# Patient Record
Sex: Male | Born: 1965 | Race: White | Hispanic: No | Smoking: Current every day smoker
Health system: Southern US, Community
[De-identification: ages and names within clinical notes are randomized; demographics above are authoritative.]

## PROBLEM LIST (undated history)

## (undated) DIAGNOSIS — M1990 Unspecified osteoarthritis, unspecified site: Secondary | ICD-10-CM

## (undated) DIAGNOSIS — G4730 Sleep apnea, unspecified: Secondary | ICD-10-CM

## (undated) DIAGNOSIS — K219 Gastro-esophageal reflux disease without esophagitis: Secondary | ICD-10-CM

## (undated) DIAGNOSIS — Z5189 Encounter for other specified aftercare: Secondary | ICD-10-CM

## (undated) HISTORY — PX: BACK SURGERY: SHX140

## (undated) HISTORY — PX: POSTERIOR FUSION OCCIPUT-C2: SUR624

---

## 1987-11-11 HISTORY — PX: CARDIAC SURGERY: SHX584

## 1987-11-11 HISTORY — PX: STOMACH SURGERY: SHX791

## 2001-07-08 ENCOUNTER — Ambulatory Visit (HOSPITAL_BASED_OUTPATIENT_CLINIC_OR_DEPARTMENT_OTHER): Admission: RE | Admit: 2001-07-08 | Discharge: 2001-07-08 | Payer: Self-pay | Admitting: Pulmonary Disease

## 2003-10-25 ENCOUNTER — Ambulatory Visit (HOSPITAL_COMMUNITY): Admission: RE | Admit: 2003-10-25 | Discharge: 2003-10-26 | Payer: Self-pay | Admitting: Neurosurgery

## 2003-11-27 ENCOUNTER — Ambulatory Visit (HOSPITAL_BASED_OUTPATIENT_CLINIC_OR_DEPARTMENT_OTHER): Admission: RE | Admit: 2003-11-27 | Discharge: 2003-11-27 | Payer: Self-pay | Admitting: Pulmonary Disease

## 2004-01-11 ENCOUNTER — Encounter: Admission: RE | Admit: 2004-01-11 | Discharge: 2004-01-11 | Payer: Self-pay | Admitting: Neurosurgery

## 2004-07-09 ENCOUNTER — Encounter: Admission: RE | Admit: 2004-07-09 | Discharge: 2004-07-09 | Payer: Self-pay | Admitting: Neurosurgery

## 2004-08-29 ENCOUNTER — Inpatient Hospital Stay (HOSPITAL_COMMUNITY): Admission: RE | Admit: 2004-08-29 | Discharge: 2004-08-30 | Payer: Self-pay | Admitting: Neurosurgery

## 2004-09-04 ENCOUNTER — Encounter: Admission: RE | Admit: 2004-09-04 | Discharge: 2004-09-04 | Payer: Self-pay | Admitting: Neurosurgery

## 2004-10-08 ENCOUNTER — Encounter: Admission: RE | Admit: 2004-10-08 | Discharge: 2004-10-08 | Payer: Self-pay | Admitting: Neurosurgery

## 2004-10-09 ENCOUNTER — Encounter: Admission: RE | Admit: 2004-10-09 | Discharge: 2004-10-09 | Payer: Self-pay | Admitting: Neurosurgery

## 2005-01-24 IMAGING — CR DG LUMBAR SPINE 2-3V
3 series · 3 of 3 positions shown · non-contrast
Comparison: none

CLINICAL DATA: Lumbar fusion.  Some right leg pain. 
 LUMBAR SPINE ? THREE VIEWS:
 Three views of the lumbar spine were obtained and compared to an intraoperative film from [HOSPITAL] dated 08/29/04.
 Transpedicular and interbody fusion has been performed at L4-5 and L5-S1.  The interbody fusion plugs appear to be in good position and normal in height and the hardware is intact.  Normal alignment is maintained.  
 There is a rounded soft tissue opacity in the right abdomen.  This could represent a renal cyst, but a soft tissue mass in the right abdomen cannot excluded.  There is also question of a left lower pole renal calculus.

[view not recorded (1 of 3)]
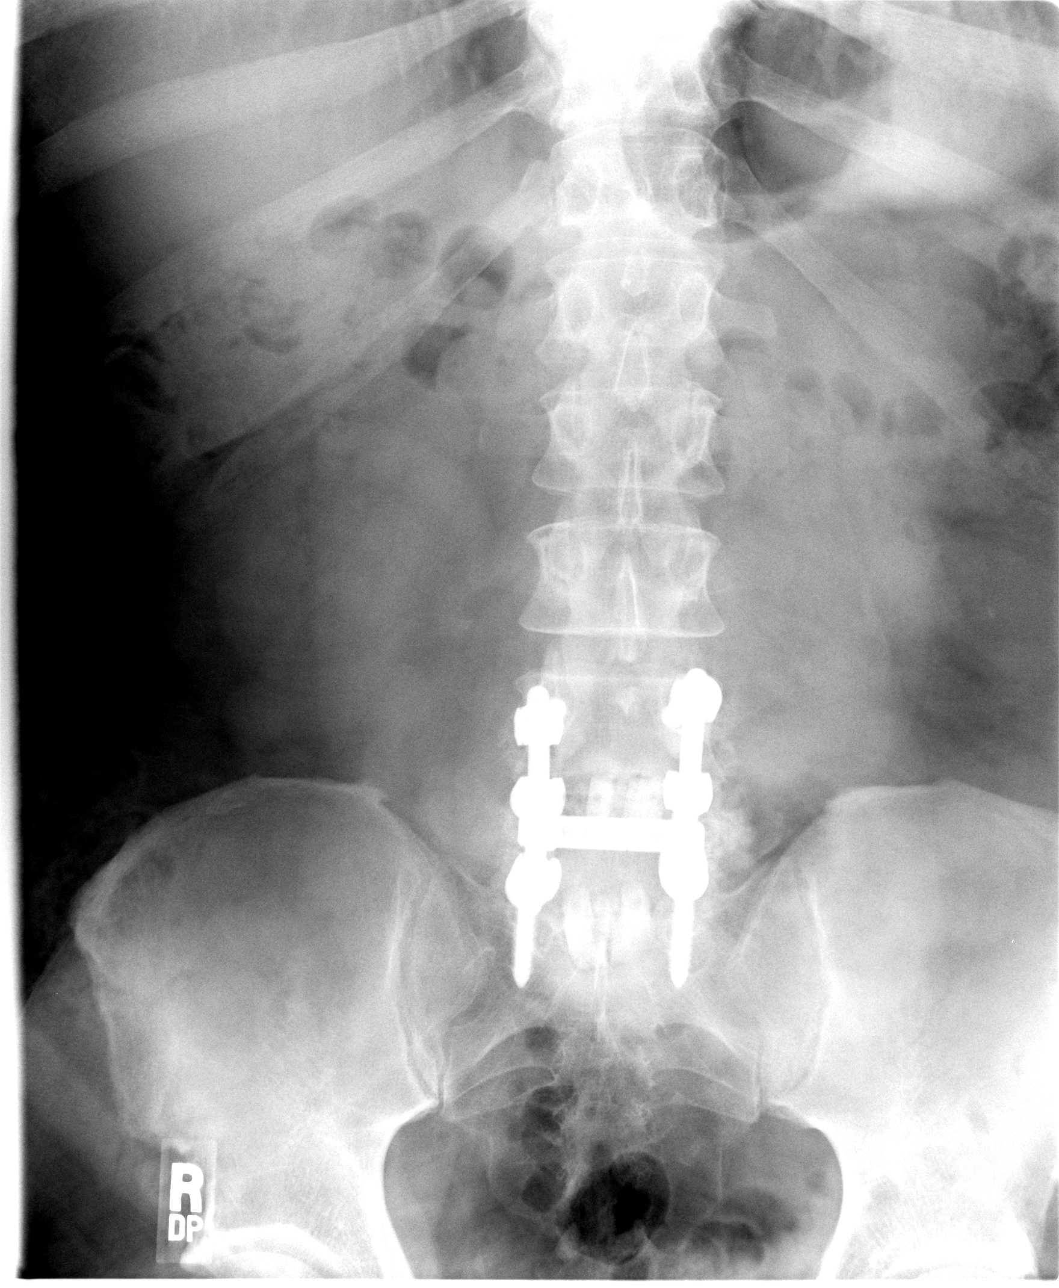

[view not recorded (2 of 3)]
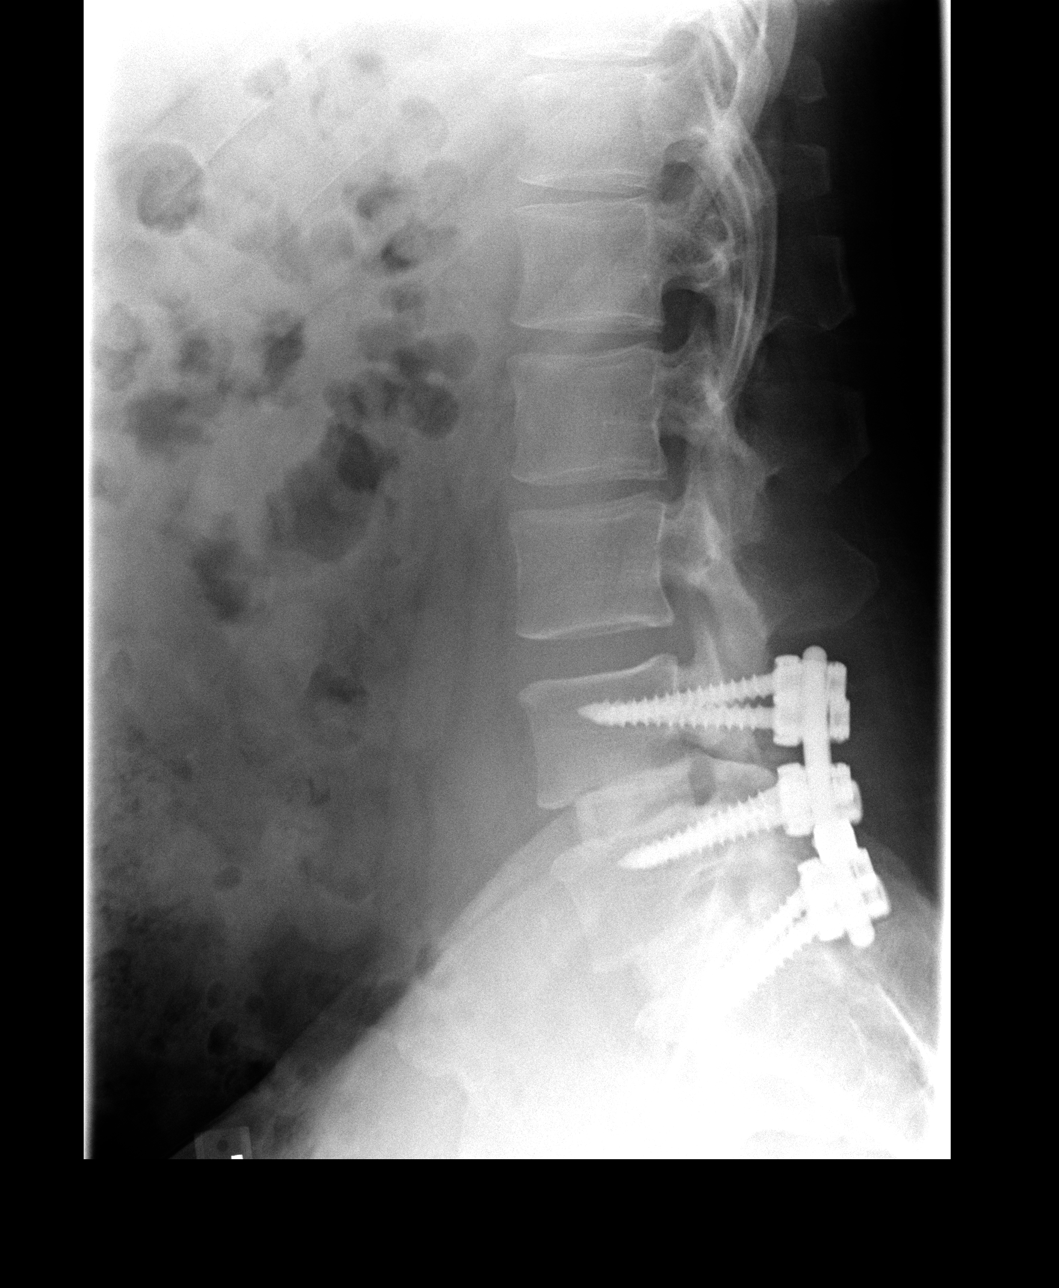

[view not recorded (3 of 3)]
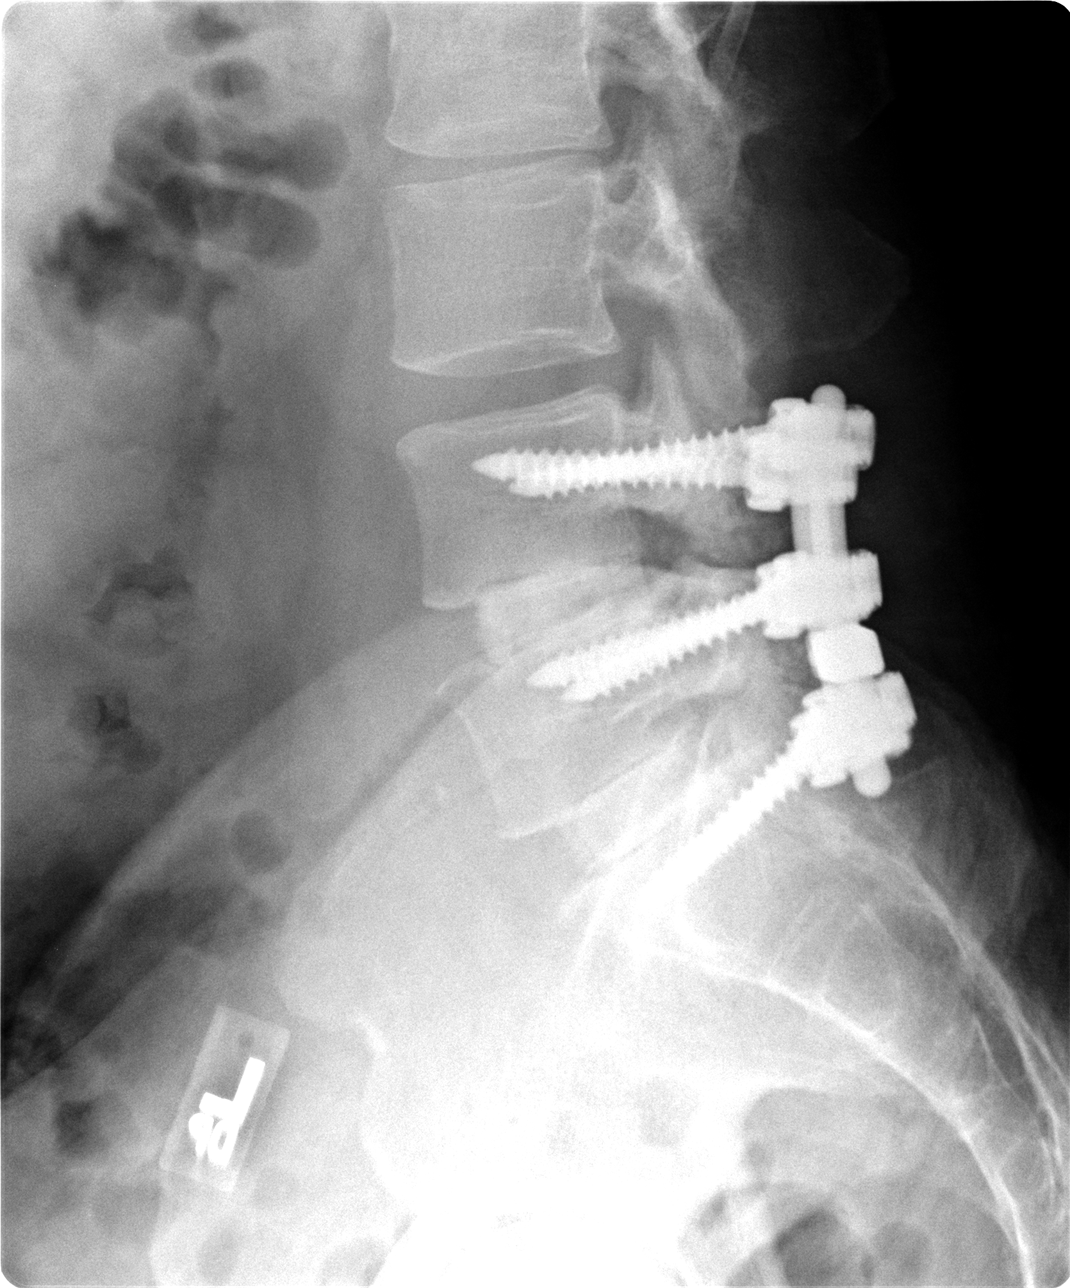

[3 of 3 positions shown; findings below may reference images not displayed]

IMPRESSION: 1.  Transpedicular and interbody fusion at L4-5 and L5-S1.  Normal alignment.  
 2.  Question of rounded soft tissue mass in the right abdomen.  Small left lower pole renal calculus.

## 2005-02-05 ENCOUNTER — Encounter: Admission: RE | Admit: 2005-02-05 | Discharge: 2005-02-05 | Payer: Self-pay | Admitting: Unknown Physician Specialty

## 2005-03-06 ENCOUNTER — Encounter: Admission: RE | Admit: 2005-03-06 | Discharge: 2005-03-06 | Payer: Self-pay | Admitting: Neurosurgery

## 2005-10-20 ENCOUNTER — Ambulatory Visit: Payer: Self-pay | Admitting: Family Medicine

## 2005-11-13 ENCOUNTER — Ambulatory Visit: Payer: Self-pay | Admitting: Family Medicine

## 2005-12-17 ENCOUNTER — Ambulatory Visit: Payer: Self-pay | Admitting: Family Medicine

## 2006-01-15 ENCOUNTER — Ambulatory Visit: Payer: Self-pay | Admitting: Family Medicine

## 2006-07-10 ENCOUNTER — Encounter
Admission: RE | Admit: 2006-07-10 | Discharge: 2006-10-08 | Payer: Self-pay | Admitting: Physical Medicine & Rehabilitation

## 2006-07-10 ENCOUNTER — Ambulatory Visit: Payer: Self-pay | Admitting: Physical Medicine & Rehabilitation

## 2008-04-11 ENCOUNTER — Encounter
Admission: RE | Admit: 2008-04-11 | Discharge: 2008-04-11 | Payer: Self-pay | Admitting: Physical Medicine and Rehabilitation

## 2008-04-25 ENCOUNTER — Encounter: Admission: RE | Admit: 2008-04-25 | Discharge: 2008-04-25 | Payer: Self-pay | Admitting: Neurosurgery

## 2010-06-19 ENCOUNTER — Encounter
Admission: RE | Admit: 2010-06-19 | Discharge: 2010-06-19 | Payer: Self-pay | Admitting: Physical Medicine and Rehabilitation

## 2010-11-30 ENCOUNTER — Encounter: Payer: Self-pay | Admitting: Neurosurgery

## 2010-12-05 ENCOUNTER — Ambulatory Visit (HOSPITAL_COMMUNITY)
Admission: RE | Admit: 2010-12-05 | Discharge: 2010-12-05 | Payer: Commercial Managed Care - PPO | Source: Home / Self Care | Attending: Neurosurgery | Admitting: Neurosurgery

## 2010-12-05 LAB — CBC
HCT: 44.3 % (ref 39.0–52.0)
Hemoglobin: 15.1 g/dL (ref 13.0–17.0)
MCH: 30.8 pg (ref 26.0–34.0)
MCHC: 34.1 g/dL (ref 30.0–36.0)
MCV: 90.4 fL (ref 78.0–100.0)
Platelets: 261 10*3/uL (ref 150–400)
RBC: 4.9 MIL/uL (ref 4.22–5.81)
RDW: 13.1 % (ref 11.5–15.5)
WBC: 7.5 10*3/uL (ref 4.0–10.5)

## 2010-12-05 LAB — SURGICAL PCR SCREEN
MRSA, PCR: NEGATIVE
Staphylococcus aureus: POSITIVE — AB

## 2010-12-05 LAB — APTT: aPTT: 28 seconds (ref 24–37)

## 2010-12-05 LAB — BASIC METABOLIC PANEL
BUN: 16 mg/dL (ref 6–23)
CO2: 26 mEq/L (ref 19–32)
Calcium: 9.6 mg/dL (ref 8.4–10.5)
Chloride: 106 mEq/L (ref 96–112)
Creatinine, Ser: 0.81 mg/dL (ref 0.4–1.5)
GFR calc Af Amer: >60 mL/min (ref >60)
Potassium: 5 mEq/L (ref 3.5–5.1)

## 2010-12-05 LAB — TYPE AND SCREEN
ABO/RH(D): A POS
Antibody Screen: NEGATIVE

## 2010-12-05 LAB — ABO/RH: ABO/RH(D): A POS

## 2010-12-05 LAB — PROTIME-INR: Prothrombin Time: 13.2 seconds (ref 11.6–15.2)

## 2011-02-24 ENCOUNTER — Inpatient Hospital Stay (HOSPITAL_COMMUNITY)
Admission: RE | Admit: 2011-02-24 | Payer: Commercial Managed Care - PPO | Source: Ambulatory Visit | Admitting: Neurosurgery

## 2011-03-28 NOTE — Op Note (Signed)
NAME:  Ryan [name], Ryan [name]                        ACCOUNT NO.:  [id number]   MEDICAL RECORD NO.:  [id number]                   PATIENT TYPE:  OUT   LOCATION:  SLEE                                 FACILITY:  [facility]   PHYSICIAN:  Donalee Citrin, M.D.                     DATE OF BIRTH:  1966-02-07   DATE OF PROCEDURE:  10/25/2003  DATE OF DISCHARGE:                                 OPERATIVE REPORT   PREOPERATIVE DIAGNOSIS:  Cervical myelopathy from severe spinal stenosis at  C5-6 secondary to a large ruptured disk with metamyelia within the cord.   POSTOPERATIVE DIAGNOSIS:  Cervical myelopathy from severe spinal stenosis at  C5-6 secondary to a large ruptured disk with  metamyelia within the cord.   OPERATION PERFORMED:  Anterior cervical diskectomy and fusion at C5-6 using  an 8 mm patellar wedge and a 25 mm Atlantis plate, four 15 mm variable angle  screws.   SURGEON:  Donalee Citrin, M.D.   ASSISTANT:  Tia Alert, MD   ANESTHESIA:  General endotracheal.   INDICATIONS FOR PROCEDURE:  The patient is a very pleasant 45 year old  gentleman who sustained a work related injury with severe neck pain and is  here because of weakness of his hands with his legs giving way on him.  Preoperative assessment revealed a cervical myelopathy with weakness of his  triceps, weakness of his hands and difficulty with his gait.  MRI showed a  very large ruptured disk compressing the spinal cord that caused metamyelia  within the cord and severe spinal stenosis. The patient was recommended  anterior cervical diskectomy and fusion.  I extensively went over the risks  and benefits of surgery with him.  He understands and agreed to proceed  forward.   DESCRIPTION OF PROCEDURE:  The patient was brought to the operating room was  induced under general anesthesia, placed supine with neck in slight  extension with five pounds of halter traction.  The right side of the neck  was prepped and draped in the usual  sterile fashion after preoperative x-ray  localized the C5 vertebral body.  A curvilinear incision was made just off  the midline extended toward the anterior border of the sternocleidomastoid  muscle and the superficial layer of the platysmas was dissected out and  divided longitudinally.  The avascular plane between the sternocleidomastoid  and the strap muscles was developed down to prevertebral fascia.  The  prevertebral fascia was incised with Kitners.  Then intraoperative x-ray  confirmed localization of the C4-5 disk space.  Then annulotomy was made one  disk space below this with a 15 blade scalpel.  Pituitary rongeurs were used  to adequately clean out the disk space from the anterior margin of the  annulus as well as the VA upgoing curet was used to scrape the end plates  and remove the anterior margin of the annulus.  Using  the high speed drill  the remainder of the end plates were evened off and squared down with the  posterior annulus.  Then using pituitary rongeurs, a large fragment was  removed that pushed through a rent in the posterior annulus and posterior  longitudinal ligament compressing the central part of the spinal cord.  Then  using a blunt nerve hook, several large fragments of disk were removed from  this rent in the ligament and the thecal sac was visualized.  Then using a 1  and 2 mm Kerrison punches the remainder of the posterior longitudinal  ligament was removed in piecemeal fashion and several more fragments of disk  were removed compressing the spinal cord from behind the C5 vertebral body.  The posterior longitudinal ligament was noted to be severely hypertrophied  behind the bodies of C5 and C6.  This was all underbitten with a 1 and 2 mm  Kerrison punch decompressing the thecal sac and spinal cord.  At the end of  the diskectomy, the thecal sac was noted to be completely decompressed with  good spinal cord pulsations visualized through it.  Both proximal  aspects of  both C6 neural foramina were visualized.  There was no further disk fragment  appreciated behind the C5 vertebral body or behind C6 and the spinal cord  was decompressed.  The wound was copiously irrigated.  The end plates were  scraped and prepared to receive the bone graft and a 8 mm patellar wedge was  sized, selected, fashioned and placed 1 mm deep to the anterior vertebral  body line.  Then a 25 mm Atlantis plate was sized, selected and inserted  with four 15 mm variable angle screws which were drilled, tapped and placed.  All screws had excellent purchase.  Screws were tightened down.  The wound  was copiously irrigated and meticulous hemostasis was maintained.  The  platysma was reapproximated with 3-0 interrupted Vicryl and the skin was  closed with running 4-0 subcuticular.  Benzoin and Steri-Strips were  applied.  The patient was then transferred to the recovery room in stable  condition.  At the end of the case, sponge, needle and instrument counts  were correct.                                               Donalee Citrin, M.D.    GC/MEDQ  D:  10/25/2003  T:  10/26/2003  Job:  [id number]

## 2011-03-28 NOTE — Op Note (Signed)
Ryan [name], Ryan [name]              ACCOUNT NO.:  [id number]   MEDICAL RECORD NO.:  [id number]          PATIENT TYPE:  INP   LOCATION:  2899                         FACILITY:  [facility]   PHYSICIAN:  Donalee Citrin, M.D.        DATE OF BIRTH:  1966/08/07   DATE OF PROCEDURE:  DATE OF DISCHARGE:                                 OPERATIVE REPORT   DATE OF OPERATION:  August 29, 2004.   PREOPERATIVE DIAGNOSES:  1.  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  2.  Severe degenerative disease with annular tearing, L4-5 and L5-S1, with      mechanical back pain and neurogenic claudication.   POSTOPERATIVE DIAGNOSES:  1.  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  2.  Severe degenerative disease with annular tearing, L4-5 and L5-S1, with      mechanical back pain and neurogenic claudication.   PROCEDURE:  1.  Radical decompressive laminectomies, L4-5 and L5-S1.  2.  Posterior lumbar interbody fusion, L4-5 and L5-S1, using 12 x 26 mm      tangent allograft wedges at L4-5, and 10 x 26 mm wedges at L5-S1.  3.  Pedicle screw fixation, L4 to S1, using the M-10 CD Horizon pedicle      screw system.  4.  Posterolateral arthrodesis, L4 to S1, using locally harvested autograft.  5.  Placement of medium Hemovac drains.   SURGEON:  Donalee Citrin, MD.   ASSISTANT:  Tia Alert, MD.   ANESTHESIA:  General endotracheal.   HISTORY OF PRESENT ILLNESS:  The patient is a very pleasant, 45 year old  gentleman, who has had longstanding back and bilateral leg pain following a  work-related injury to which initially he was treated with a large ruptured  disk causing cervical myelopathy in his neck; however, he had persistent  back and leg pain.  This was worked up with MRI and diskography, which  showed severe annular tears and disk injury on the top on the face of  congenital short pedicles.  The patient subsequently failed all  forms of  conservative treatment and was recommended decompressive stabilization  procedure when diskography confirmed 2 concordant disk injections and 1  normal control in the setting of bilateral lumbar radiculopathy consistent  with both L4-5 and S1 radiculopathies.  The patient was explained the risks  and benefits of the surgery.  These were extensively discussed with him.  He  understood and agreed to proceed forward.   The patient was brought to the OR and was induced under general anesthesia.  He was positioned prone on the Wilson frame.  The back was scrubbed in the  usual sterile fashion.  Preop x-ray localized the L5 vertebral body.  A  midline incision was made after infiltration with 10 mL of lidocaine with  epi.  Bovie electrocautery was used to take down the subcutaneous tissue,  and subperiosteal dissection was carried out in the lamina of L4-5 and S1  bilaterally.  T piece were also dissected  free at L4-5 and S1, and then a  self-retaining retractor was placed.  Intraoperative x-rays confirmed  localization at L5 pedicle.  Then using a Leksell rongeur, the spinous  process of L4 and L5 was removed, and radical decompressive laminectomies  were begun at L4-5 and L5-S1.  There was marked, severe, spinal stenosis  with hourglass displacement at the thecal sac, both in the facet complexes  at L4-5 as well as L5-S1.  These were all dissected free with a #4 Penfield  and under bent with a 3 and 4 mm Kerrison punch.  The entire medial facets  were removed from the L4-5 and L5-S1 facet complexes on each side.  Both L4,  L5, and S1 neuroforamen were identified and radically decompressed.  The L4  and L5 nerve roots were markedly stenotic.  These were all under bent with a  2 and 3 mm Kerrison punch.  Then, attention was taken to the disk space  where the epidural vein was coagulated.  The right-sided L5 nerve root was  reflected medially.  Annulotomy was made in the disk at L4-5 on  the right,  and this disk was adequately cleaned out.  The disk was noted to be markedly  liquified with vacuum phenomena and severe injury.  This was all cleaned  out.  A downgoing Epstein curette, size 12 cutter and chisel, and a 12 x 26  mm tangent allograft was inserted on the right side.  This was all confirmed  with fluoroscopy at each step along the way.  Then on the left side, the L5  nerve root was reflected medially, the disk space was cleaned out, and using  a size 12 cutter and chisel, the end plates were prepared, a downgoing  Epstein curette used to scrape off the central end plates, locally harvested  allograft was packed against the right-sided allograft, and the left-sided  allograft was inserted.  Then, attention was taken to L5-S1.  First on the  right, the S1 nerve root was reflected medially, the disk space was entered,  cleaned out, and incised, a #10 distractor was inserted, this had good  apposition in the end plates.  Attention was taken to the left side where  the left S1 nerve root was reflected medially.  The disk space was cleaned  out with a size #10 cutter and chisel.  Again, fluoroscopy confirming depth  and trajectory each step along the way.  The disk space was adequately  cleaned out.  The disk was noted to be markedly liquified and also  degenerated with severe injury and vacuum phenomena.  Then the left-sided  allograft was inserted, then the right side distractor was removed.  Disc  spaces and end plates were prepared in a similar fashion.  Locally harvested  allograft was packed against the left-sided allograft, and the right-sided  allograft was inserted.  Again, fluoroscopy confirmed good position of each  allograft at each step along the way.  Then attention was taken to pedicle  screw placement using high speed drill and from intracanalicular as well as fluoroscopic landmarks, pilot holes were drilled and cannulated with the  olive probe, tapped  with a 5.5 tap, and 6.5 x 45 pedicle screw inserted at  L4 on the left.  At each step along the way, the pedicle was explored from  within as well as the canal explored.  The medial aspect was confirmed with  no medial lateral breach.  After the 6.5 x 45 screw was  inserted at L4, a  6.5 x 40 at L5 and a 6.5 x 35 at S1 in similar fashions, all on the left  side.  Then attention was taken to the right side, again in a similar  fashion with pilot holes tapped with a 5.5 tap, probed, and a 6.5 x 45 screw  at 4, a 6.5 x 40 at 5, and a 6.5 x 35 at S1.  After all 6 pedicle screws had  been placed, the wound was copiously irrigated, good hemostasis was  maintained.  Aggressive decortication was carried out in the lateral T piece  and gutters on both sides.  The remainder of the locally harvested allograft  was packed in the lateral gutters.  Then 60-mm rods were sized, selected,  and inserted and pre-lordosed.  __________ were tightened down at S1.  The  L5 pedicle screw was compressed against S1 and tightened down, and the L4  pedicle screw compressed against L5.  After all pedicle screws and rods had  been placed, a 421 cross-link was inserted.  The epidural vein was  recoagulated, the neuroforamen were re-explored confirming no graft material  had migrated and the nerves were widely decompressed.  Gelfoam was laid on  top of the dura, and postop fluoroscopy confirmed good position of the rods,  screws, and bone grafts.  Then a medium Hemovac drain was placed.  The  muscle and fascia were reapproximated with 0 interrupted Vicryl, the  subcutaneous tissues with reapproximated with 2-0 interrupted Vicryl, and  the skin was closed with a running 4-0 subcuticular.  Benzoin Steri-Strips  applied.  Patient went to the recovery room in stable condition.  At the end  of the case, needle count and sponge count were correct.     GC/MEDQ  D:  08/29/2004  T:  08/29/2004  Job:  [id number]
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
## 2012-03-04 ENCOUNTER — Other Ambulatory Visit: Payer: Self-pay | Admitting: Neurosurgery

## 2012-03-23 ENCOUNTER — Encounter (HOSPITAL_COMMUNITY): Payer: Self-pay | Admitting: Pharmacy Technician

## 2012-04-02 ENCOUNTER — Encounter (HOSPITAL_COMMUNITY)
Admission: RE | Admit: 2012-04-02 | Discharge: 2012-04-02 | Disposition: A | Discharge status: Home / Self Care | Payer: Medicare Other | Source: Ambulatory Visit | Attending: Neurosurgery | Admitting: Neurosurgery

## 2012-04-02 ENCOUNTER — Other Ambulatory Visit (HOSPITAL_COMMUNITY): Payer: Commercial Managed Care - PPO

## 2012-04-02 ENCOUNTER — Inpatient Hospital Stay (HOSPITAL_COMMUNITY)
Admission: RE | Admit: 2012-04-02 | Discharge: 2012-04-02 | Payer: Commercial Managed Care - PPO | Source: Ambulatory Visit

## 2012-04-02 ENCOUNTER — Encounter (HOSPITAL_COMMUNITY): Payer: Self-pay

## 2012-04-02 HISTORY — DX: Sleep apnea, unspecified: G47.30

## 2012-04-02 HISTORY — DX: Gastro-esophageal reflux disease without esophagitis: K21.9

## 2012-04-02 HISTORY — DX: Unspecified osteoarthritis, unspecified site: M19.90

## 2012-04-02 HISTORY — DX: Encounter for other specified aftercare: Z51.89

## 2012-04-02 LAB — TYPE AND SCREEN
ABO/RH(D): A POS
Antibody Screen: NEGATIVE

## 2012-04-02 LAB — BASIC METABOLIC PANEL WITH GFR
BUN: 13 mg/dL (ref 6–23)
CO2: 27 meq/L (ref 19–32)
Calcium: 10 mg/dL (ref 8.4–10.5)
Chloride: 97 meq/L (ref 96–112)
Creatinine, Ser: 0.76 mg/dL (ref 0.50–1.35)
GFR calc Af Amer: >90 mL/min
GFR calc non Af Amer: >90 mL/min
Glucose, Bld: 377 mg/dL — ABNORMAL HIGH (ref 70–99)
Potassium: 4.2 meq/L (ref 3.5–5.1)
Sodium: 134 meq/L — ABNORMAL LOW (ref 135–145)

## 2012-04-02 LAB — CBC
HCT: 45 % (ref 39.0–52.0)
Hemoglobin: 16.1 g/dL (ref 13.0–17.0)
MCH: 31.6 pg (ref 26.0–34.0)
MCHC: 35.8 g/dL (ref 30.0–36.0)

## 2012-04-02 NOTE — Progress Notes (Deleted)
vanessa called to have dr pool release orders

## 2012-04-02 NOTE — Pre-Procedure Instructions (Signed)
20 BRAVE DACK  04/02/2012   Your procedure is scheduled on:  04/07/12  Report to [facility] at 630 AM.  Call this number if you have problems the morning of surgery: [phone]   Remember:   Do not eat food:After Midnight.  May have clear liquids: up to 4 Hours before arrival 230 am.  Clear liquids include soda, tea, black coffee, apple or grape juice, broth.  Take these medicines the morning of surgery with A SIP OF WATER: pain med, lyrica, inhaler   Do not wear jewelry, make-up or nail polish.  Do not wear lotions, powders, or perfumes. You may wear deodorant.  Do not shave 48 hours prior to surgery. Men may shave face and neck.  Do not bring valuables to the hospital.  Contacts, dentures or bridgework may not be worn into surgery.  Leave suitcase in the car. After surgery it may be brought to your room.  For patients admitted to the hospital, checkout time is 11:00 AM the day of discharge.   Patients discharged the day of surgery will not be allowed to drive home.  Name and phone number of your driver: Laverna Peace [phone]  Special Instructions: CHG Shower Use Special Wash: 1/2 bottle night before surgery and 1/2 bottle morning of surgery.   Please read over the following fact sheets that you were given: Pain Booklet, Coughing and Deep Breathing, Blood Transfusion Information, MRSA Information and Surgical Site Infection Prevention

## 2012-04-06 MED ORDER — CEFAZOLIN SODIUM-DEXTROSE 2-3 GM-% IV SOLR
2.0000 g | INTRAVENOUS | Status: DC
  Administered 2012-04-07: 2 g via INTRAVENOUS
  Filled 2012-04-06: qty 50

## 2012-04-06 MED ORDER — CEFAZOLIN SODIUM 1-5 GM-% IV SOLN: 1.0000 g | INTRAVENOUS | Status: DC

## 2012-04-07 ENCOUNTER — Inpatient Hospital Stay (HOSPITAL_COMMUNITY): Payer: Medicare Other

## 2012-04-07 ENCOUNTER — Ambulatory Visit (HOSPITAL_COMMUNITY): Payer: Medicare Other | Admitting: Certified Registered"

## 2012-04-07 ENCOUNTER — Inpatient Hospital Stay (HOSPITAL_COMMUNITY)
Admission: RE | Admit: 2012-04-07 | Discharge: 2012-04-08 | DRG: 460 | Disposition: A | Discharge status: Home / Self Care | Payer: Medicare Other | Source: Ambulatory Visit | Attending: Neurosurgery | Admitting: Neurosurgery

## 2012-04-07 ENCOUNTER — Encounter (HOSPITAL_COMMUNITY): Payer: Self-pay | Admitting: Surgery

## 2012-04-07 ENCOUNTER — Encounter (HOSPITAL_COMMUNITY): Payer: Self-pay | Admitting: Certified Registered"

## 2012-04-07 ENCOUNTER — Encounter (HOSPITAL_COMMUNITY): Payer: Self-pay | Admitting: General Practice

## 2012-04-07 ENCOUNTER — Encounter (HOSPITAL_COMMUNITY)
Admission: RE | Disposition: A | Discharge status: Home / Self Care | Payer: Self-pay | Source: Ambulatory Visit | Attending: Neurosurgery

## 2012-04-07 DIAGNOSIS — J45909 Unspecified asthma, uncomplicated: Secondary | ICD-10-CM | POA: Diagnosis present

## 2012-04-07 DIAGNOSIS — E119 Type 2 diabetes mellitus without complications: Secondary | ICD-10-CM | POA: Diagnosis present

## 2012-04-07 DIAGNOSIS — Z472 Encounter for removal of internal fixation device: Secondary | ICD-10-CM

## 2012-04-07 DIAGNOSIS — Z01812 Encounter for preprocedural laboratory examination: Secondary | ICD-10-CM

## 2012-04-07 DIAGNOSIS — F17200 Nicotine dependence, unspecified, uncomplicated: Secondary | ICD-10-CM | POA: Diagnosis present

## 2012-04-07 DIAGNOSIS — M48061 Spinal stenosis, lumbar region without neurogenic claudication: Principal | ICD-10-CM | POA: Diagnosis present

## 2012-04-07 DIAGNOSIS — K219 Gastro-esophageal reflux disease without esophagitis: Secondary | ICD-10-CM | POA: Diagnosis present

## 2012-04-07 HISTORY — PX: LUMBAR FUSION: SHX111

## 2012-04-07 LAB — GLUCOSE, CAPILLARY
Glucose-Capillary: 321 mg/dL — ABNORMAL HIGH (ref 70–99)
Glucose-Capillary: 335 mg/dL — ABNORMAL HIGH (ref 70–99)

## 2012-04-07 SURGERY — POSTERIOR LUMBAR FUSION 1 LEVEL
Anesthesia: General | Site: Back

## 2012-04-07 MED ORDER — ONDANSETRON HCL 4 MG/2ML IJ SOLN
4.0000 mg | INTRAMUSCULAR | Status: DC | PRN
  Administered 2012-04-07: 4 mg via INTRAVENOUS
  Filled 2012-04-07: qty 2

## 2012-04-07 MED ORDER — ONDANSETRON HCL 4 MG/2ML IJ SOLN
4.0000 mg | Freq: Once | INTRAMUSCULAR | Status: AC | PRN
  Administered 2012-04-07: 4 mg via INTRAVENOUS

## 2012-04-07 MED ORDER — PROPOFOL 10 MG/ML IV EMUL
INTRAVENOUS | Status: DC | PRN
  Administered 2012-04-07: 100 mg via INTRAVENOUS

## 2012-04-07 MED ORDER — SIMVASTATIN 20 MG PO TABS
20.0000 mg | ORAL_TABLET | Freq: Every day | ORAL | Status: DC
  Administered 2012-04-07: 20 mg via ORAL
  Filled 2012-04-07 (×2): qty 1

## 2012-04-07 MED ORDER — HYDROMORPHONE HCL PF 1 MG/ML IJ SOLN
INTRAMUSCULAR | Status: AC
  Administered 2012-04-07: 14:00:00
  Filled 2012-04-07: qty 1

## 2012-04-07 MED ORDER — BACITRACIN 50000 UNITS IM SOLR
INTRAMUSCULAR | Status: AC
  Administered 2012-04-07: 08:00:00
  Filled 2012-04-07: qty 1

## 2012-04-07 MED ORDER — MENTHOL 3 MG MT LOZG: 1.0000 | LOZENGE | OROMUCOSAL | Status: DC | PRN

## 2012-04-07 MED ORDER — ACETAMINOPHEN 325 MG PO TABS: 650.0000 mg | ORAL_TABLET | ORAL | Status: DC | PRN

## 2012-04-07 MED ORDER — SODIUM CHLORIDE 0.9 % IJ SOLN
3.0000 mL | Freq: Two times a day (BID) | INTRAMUSCULAR | Status: DC
  Administered 2012-04-07 (×2): 3 mL via INTRAVENOUS

## 2012-04-07 MED ORDER — OXYCODONE HCL 5 MG PO TABS
30.0000 mg | ORAL_TABLET | Freq: Four times a day (QID) | ORAL | Status: DC
  Administered 2012-04-07 – 2012-04-08 (×4): 30 mg via ORAL
  Filled 2012-04-07 (×4): qty 6

## 2012-04-07 MED ORDER — 0.9 % SODIUM CHLORIDE (POUR BTL) OPTIME
TOPICAL | Status: DC | PRN
  Administered 2012-04-07: 1000 mL

## 2012-04-07 MED ORDER — CEFAZOLIN SODIUM 1-5 GM-% IV SOLN
1.0000 g | Freq: Three times a day (TID) | INTRAVENOUS | Status: AC
  Administered 2012-04-07 – 2012-04-08 (×2): 1 g via INTRAVENOUS
  Filled 2012-04-07 (×2): qty 50

## 2012-04-07 MED ORDER — MAGNESIUM OXIDE 400 MG PO TABS: 400.0000 mg | ORAL_TABLET | Freq: Every day | ORAL | Status: DC

## 2012-04-07 MED ORDER — PHENOL 1.4 % MT LIQD: 1.0000 | OROMUCOSAL | Status: DC | PRN

## 2012-04-07 MED ORDER — FENTANYL CITRATE 0.05 MG/ML IJ SOLN
INTRAMUSCULAR | Status: DC | PRN
  Administered 2012-04-07: 50 ug via INTRAVENOUS
  Administered 2012-04-07: 150 ug via INTRAVENOUS
  Administered 2012-04-07 (×3): 50 ug via INTRAVENOUS

## 2012-04-07 MED ORDER — ALBUTEROL SULFATE HFA 108 (90 BASE) MCG/ACT IN AERS: 2.0000 | INHALATION_SPRAY | Freq: Four times a day (QID) | RESPIRATORY_TRACT | Status: DC | PRN

## 2012-04-07 MED ORDER — LACTATED RINGERS IV SOLN
INTRAVENOUS | Status: DC | PRN
  Administered 2012-04-07 (×3): via INTRAVENOUS

## 2012-04-07 MED ORDER — GLYCOPYRROLATE 0.2 MG/ML IJ SOLN
INTRAMUSCULAR | Status: DC | PRN
  Administered 2012-04-07: 0.4 mg via INTRAVENOUS

## 2012-04-07 MED ORDER — ONDANSETRON HCL 4 MG/2ML IJ SOLN
INTRAMUSCULAR | Status: DC | PRN
  Administered 2012-04-07: 4 mg via INTRAVENOUS

## 2012-04-07 MED ORDER — PHENYLEPHRINE HCL 10 MG/ML IJ SOLN
INTRAMUSCULAR | Status: DC | PRN
  Administered 2012-04-07: 80 ug via INTRAVENOUS

## 2012-04-07 MED ORDER — ALUM & MAG HYDROXIDE-SIMETH 200-200-20 MG/5ML PO SUSP: 30.0000 mL | Freq: Four times a day (QID) | ORAL | Status: DC | PRN

## 2012-04-07 MED ORDER — HYDROMORPHONE HCL PF 1 MG/ML IJ SOLN
0.5000 mg | INTRAMUSCULAR | Status: DC | PRN
  Administered 2012-04-07 (×3): 1 mg via INTRAVENOUS
  Filled 2012-04-07 (×3): qty 1

## 2012-04-07 MED ORDER — HEMOSTATIC AGENTS (NO CHARGE) OPTIME
TOPICAL | Status: DC | PRN
  Administered 2012-04-07: 1 via TOPICAL

## 2012-04-07 MED ORDER — NEOSTIGMINE METHYLSULFATE 1 MG/ML IJ SOLN
INTRAMUSCULAR | Status: DC | PRN
  Administered 2012-04-07: 3 mg via INTRAVENOUS

## 2012-04-07 MED ORDER — HYDROMORPHONE HCL PF 1 MG/ML IJ SOLN
0.2500 mg | INTRAMUSCULAR | Status: DC | PRN
  Administered 2012-04-07 (×4): 0.5 mg via INTRAVENOUS

## 2012-04-07 MED ORDER — LIDOCAINE-EPINEPHRINE 1 %-1:100000 IJ SOLN
INTRAMUSCULAR | Status: DC | PRN
  Administered 2012-04-07: 10 mL

## 2012-04-07 MED ORDER — ROCURONIUM BROMIDE 100 MG/10ML IV SOLN
INTRAVENOUS | Status: DC | PRN
  Administered 2012-04-07: 50 mg via INTRAVENOUS

## 2012-04-07 MED ORDER — INSULIN ASPART 100 UNIT/ML ~~LOC~~ SOLN
SUBCUTANEOUS | Status: AC
  Administered 2012-04-07: 6 [IU] via SUBCUTANEOUS
  Filled 2012-04-07: qty 1

## 2012-04-07 MED ORDER — LIDOCAINE HCL (CARDIAC) 20 MG/ML IV SOLN
INTRAVENOUS | Status: DC | PRN
  Administered 2012-04-07: 100 mg via INTRAVENOUS

## 2012-04-07 MED ORDER — METFORMIN HCL 500 MG PO TABS
1000.0000 mg | ORAL_TABLET | Freq: Two times a day (BID) | ORAL | Status: DC
  Administered 2012-04-07 – 2012-04-08 (×2): 1000 mg via ORAL
  Filled 2012-04-07 (×4): qty 2

## 2012-04-07 MED ORDER — SODIUM CHLORIDE 0.9 % IV SOLN
INTRAVENOUS | Status: DC | PRN
  Administered 2012-04-07: 13:00:00 via INTRAVENOUS

## 2012-04-07 MED ORDER — PREGABALIN 75 MG PO CAPS
150.0000 mg | ORAL_CAPSULE | Freq: Every day | ORAL | Status: DC
  Administered 2012-04-07: 150 mg via ORAL
  Filled 2012-04-07: qty 2

## 2012-04-07 MED ORDER — MIDAZOLAM HCL 5 MG/5ML IJ SOLN
INTRAMUSCULAR | Status: DC | PRN
  Administered 2012-04-07: 2 mg via INTRAVENOUS

## 2012-04-07 MED ORDER — MAGNESIUM OXIDE 400 (241.3 MG) MG PO TABS
400.0000 mg | ORAL_TABLET | Freq: Every day | ORAL | Status: DC
  Administered 2012-04-07 – 2012-04-08 (×2): 400 mg via ORAL
  Filled 2012-04-07 (×3): qty 1

## 2012-04-07 MED ORDER — INSULIN ASPART 100 UNIT/ML ~~LOC~~ SOLN
6.0000 [IU] | Freq: Once | SUBCUTANEOUS | Status: AC
  Administered 2012-04-07: 6 [IU] via SUBCUTANEOUS

## 2012-04-07 MED ORDER — SODIUM CHLORIDE 0.9 % IV SOLN
INTRAVENOUS | Status: AC
  Administered 2012-04-07: 07:00:00
  Filled 2012-04-07: qty 500

## 2012-04-07 MED ORDER — GLIMEPIRIDE 4 MG PO TABS
8.0000 mg | ORAL_TABLET | Freq: Every day | ORAL | Status: DC
  Administered 2012-04-07 – 2012-04-08 (×2): 8 mg via ORAL
  Filled 2012-04-07 (×2): qty 2

## 2012-04-07 MED ORDER — SODIUM CHLORIDE 0.9 % IR SOLN
Status: DC | PRN
  Administered 2012-04-07: 08:00:00

## 2012-04-07 MED ORDER — VECURONIUM BROMIDE 10 MG IV SOLR
INTRAVENOUS | Status: DC | PRN
  Administered 2012-04-07 (×4): 2 mg via INTRAVENOUS

## 2012-04-07 MED ORDER — BUPIVACAINE HCL (PF) 0.25 % IJ SOLN
INTRAMUSCULAR | Status: DC | PRN
  Administered 2012-04-07: 10 mL

## 2012-04-07 MED ORDER — ONDANSETRON HCL 4 MG/2ML IJ SOLN
INTRAMUSCULAR | Status: AC
  Administered 2012-04-07: 13:00:00
  Filled 2012-04-07: qty 2

## 2012-04-07 MED ORDER — ACETAMINOPHEN 650 MG RE SUPP: 650.0000 mg | RECTAL | Status: DC | PRN

## 2012-04-07 MED ORDER — THROMBIN 20000 UNITS EX KIT
PACK | CUTANEOUS | Status: DC | PRN
  Administered 2012-04-07 (×2): via TOPICAL

## 2012-04-07 SURGICAL SUPPLY — 69 items
APL SKNCLS STERI-STRIP NONHPOA (GAUZE/BANDAGES/DRESSINGS) ×1
BAG DECANTER FOR FLEXI CONT (MISCELLANEOUS) ×2 IMPLANT
BENZOIN TINCTURE PRP APPL 2/3 (GAUZE/BANDAGES/DRESSINGS) ×2 IMPLANT
BLADE SURG 11 STRL SS (BLADE) ×2 IMPLANT
BLADE SURG ROTATE 9660 (MISCELLANEOUS) IMPLANT
BRUSH SCRUB EZ PLAIN DRY (MISCELLANEOUS) ×2 IMPLANT
BUR MATCHSTICK NEURO 3.0 LAGG (BURR) ×2 IMPLANT
BUR PRECISION FLUTE 6.0 (BURR) ×2 IMPLANT
CANISTER SUCTION 2500CC (MISCELLANEOUS) ×2 IMPLANT
CAP LOCKING REVERE (Cap) ×8 IMPLANT
CLOTH BEACON ORANGE TIMEOUT ST (SAFETY) ×2 IMPLANT
CONT SPEC 4OZ CLIKSEAL STRL BL (MISCELLANEOUS) ×6 IMPLANT
COVER BACK TABLE 24X17X13 BIG (DRAPES) IMPLANT
COVER TABLE BACK 60X90 (DRAPES) ×2 IMPLANT
DECANTER SPIKE VIAL GLASS SM (MISCELLANEOUS) ×2 IMPLANT
DERMABOND ADVANCED (GAUZE/BANDAGES/DRESSINGS) ×1
DERMABOND ADVANCED .7 DNX12 (GAUZE/BANDAGES/DRESSINGS) ×1 IMPLANT
DRAPE C-ARM 42X72 X-RAY (DRAPES) ×4 IMPLANT
DRAPE LAPAROTOMY 100X72X124 (DRAPES) ×2 IMPLANT
DRAPE POUCH INSTRU U-SHP 10X18 (DRAPES) ×2 IMPLANT
DRAPE PROXIMA HALF (DRAPES) IMPLANT
DRAPE SURG 17X23 STRL (DRAPES) ×2 IMPLANT
DRSG OPSITE 4X5.5 SM (GAUZE/BANDAGES/DRESSINGS) ×2 IMPLANT
ELECT REM PT RETURN 9FT ADLT (ELECTROSURGICAL) ×2
ELECTRODE REM PT RTRN 9FT ADLT (ELECTROSURGICAL) ×1 IMPLANT
EVACUATOR 3/16  PVC DRAIN (DRAIN) ×1
EVACUATOR 3/16 PVC DRAIN (DRAIN) ×1 IMPLANT
GAUZE SPONGE 4X4 16PLY XRAY LF (GAUZE/BANDAGES/DRESSINGS) ×2 IMPLANT
GLOVE BIO SURGEON STRL SZ8 (GLOVE) ×4 IMPLANT
GLOVE BIOGEL M 8.0 STRL (GLOVE) ×2 IMPLANT
GLOVE BIOGEL PI IND STRL 6.5 (GLOVE) ×4 IMPLANT
GLOVE BIOGEL PI INDICATOR 6.5 (GLOVE) ×4
GLOVE ECLIPSE 7.5 STRL STRAW (GLOVE) IMPLANT
GLOVE EXAM NITRILE LRG STRL (GLOVE) IMPLANT
GLOVE EXAM NITRILE MD LF STRL (GLOVE) ×4 IMPLANT
GLOVE EXAM NITRILE XL STR (GLOVE) IMPLANT
GLOVE EXAM NITRILE XS STR PU (GLOVE) IMPLANT
GLOVE INDICATOR 7.0 STRL GRN (GLOVE) ×8 IMPLANT
GLOVE INDICATOR 8.5 STRL (GLOVE) ×4 IMPLANT
GOWN BRE IMP SLV AUR LG STRL (GOWN DISPOSABLE) IMPLANT
GOWN BRE IMP SLV AUR XL STRL (GOWN DISPOSABLE) IMPLANT
GOWN STRL REIN 2XL LVL4 (GOWN DISPOSABLE) IMPLANT
KIT BASIN OR (CUSTOM PROCEDURE TRAY) ×2 IMPLANT
KIT ROOM TURNOVER OR (KITS) ×2 IMPLANT
MILL MEDIUM DISP (BLADE) ×2 IMPLANT
NEEDLE HYPO 25X1 1.5 SAFETY (NEEDLE) ×2 IMPLANT
NS IRRIG 1000ML POUR BTL (IV SOLUTION) ×2 IMPLANT
PACK LAMINECTOMY NEURO (CUSTOM PROCEDURE TRAY) ×2 IMPLANT
PAD ARMBOARD 7.5X6 YLW CONV (MISCELLANEOUS) ×6 IMPLANT
PUTTY BONE DBX 5CC MIX (Putty) ×2 IMPLANT
ROD CURVED 5.5MMX55MM (Rod) ×4 IMPLANT
SCREW PEDICLE 6.5MMX45MM (Screw) ×4 IMPLANT
SCREW PEDICLE 6.5X45 (Screw) ×2 IMPLANT
SCREW PEDICLE 7.5MMX45MM (Screw) ×4 IMPLANT
SPACER CALIBER 10X22MM 11-15MM (Spacer) ×4 IMPLANT
SPONGE GAUZE 4X4 12PLY (GAUZE/BANDAGES/DRESSINGS) ×2 IMPLANT
SPONGE LAP 4X18 X RAY DECT (DISPOSABLE) IMPLANT
SPONGE SURGIFOAM ABS GEL 100 (HEMOSTASIS) ×2 IMPLANT
STRIP CLOSURE SKIN 1/2X4 (GAUZE/BANDAGES/DRESSINGS) ×4 IMPLANT
SUT VIC AB 0 CT1 18XCR BRD8 (SUTURE) ×2 IMPLANT
SUT VIC AB 0 CT1 8-18 (SUTURE) ×4
SUT VIC AB 2-0 CT1 18 (SUTURE) ×6 IMPLANT
SUT VICRYL 4-0 PS2 18IN ABS (SUTURE) ×2 IMPLANT
SYR 20ML ECCENTRIC (SYRINGE) ×2 IMPLANT
TOWEL OR 17X24 6PK STRL BLUE (TOWEL DISPOSABLE) ×2 IMPLANT
TOWEL OR 17X26 10 PK STRL BLUE (TOWEL DISPOSABLE) ×2 IMPLANT
TRAY FOLEY CATH 14FRSI W/METER (CATHETERS) ×2 IMPLANT
WATER STERILE IRR 1000ML POUR (IV SOLUTION) ×2 IMPLANT
globus revere crosslink 38-50 cm ×2 IMPLANT

## 2012-04-07 NOTE — Anesthesia Procedure Notes (Signed)
Procedure Name: Intubation Date/Time: 04/07/2012 9:10 AM Performed by: Jerilee Hoh Pre-anesthesia Checklist: Patient identified, Emergency Drugs available, Patient being monitored and Suction available Patient Re-evaluated:Patient Re-evaluated prior to inductionOxygen Delivery Method: Circle system utilized Preoxygenation: Pre-oxygenation with 100% oxygen Intubation Type: IV induction Ventilation: Mask ventilation without difficulty and Oral airway inserted - appropriate to patient size Laryngoscope Size: Mac and 4 Grade View: Grade II Tube type: Oral Tube size: 7.5 mm Number of attempts: 1 Airway Equipment and Method: Stylet Placement Confirmation: ETT inserted through vocal cords under direct vision,  positive ETCO2 and breath sounds checked- equal and bilateral Secured at: 22 cm Tube secured with: Tape Dental Injury: Teeth and Oropharynx as per pre-operative assessment

## 2012-04-07 NOTE — Anesthesia Postprocedure Evaluation (Signed)
Anesthesia Post Note  Patient: Ryan [name]  Procedure(s) Performed: Procedure(s) (LRB): POSTERIOR LUMBAR FUSION 1 LEVEL (N/A)  Anesthesia type: general  Patient location: PACU  Post pain: Pain level controlled  Post assessment: Patient's Cardiovascular Status Stable  Last Vitals:  Filed Vitals:   04/07/12 1329  BP:   Pulse: 93  Temp:   Resp: 19    Post vital signs: Reviewed and stable  Level of consciousness: sedated  Complications: No apparent anesthesia complications

## 2012-04-07 NOTE — Progress Notes (Signed)
While completing pre-op checklist pt informed Nurse that he has not taken his medication in days. Blood sugar obtained. Dr. Chaney Malling notified of patients blood sugar being 321 and he ordered that 6 units of Novolog insulin be administered Sub-Q. Will update patient on plan of care.

## 2012-04-07 NOTE — Transfer of Care (Signed)
Immediate Anesthesia Transfer of Care Note  Patient: Ryan [name]  Procedure(s) Performed: Procedure(s) (LRB): POSTERIOR LUMBAR FUSION 1 LEVEL (N/A)  Patient Location: PACU  Anesthesia Type: General  Level of Consciousness: awake, alert  and oriented  Airway & Oxygen Therapy: Patient Spontanous Breathing and Patient connected to face mask oxygen  Post-op Assessment: Report given to PACU RN, Post -op Vital signs reviewed and stable and Patient moving all extremities  Post vital signs: Reviewed and stable  Complications: No apparent anesthesia complications

## 2012-04-07 NOTE — Preoperative (Signed)
Beta Blockers   Reason not to administer Beta Blockers:Not Applicable 

## 2012-04-07 NOTE — Addendum Note (Signed)
Addendum  created 04/07/12 1619 by Jerilee Hoh, CRNA   Modules edited:Anesthesia Medication Administration
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
## 2012-04-07 NOTE — Op Note (Signed)
Preoperative diagnosis: Lumbar spinal stenosis instability L3-4 and bilateral L3 and L4 radiculopathies  Postoperative diagnosis: Same  Procedure exploration of fusion removal of hardware L4-S1 decompressive laminectomy L3-4 and excess will be needed with a standard interbody fusion posterior lumbar interbody fusion L3-4 using globus caliber peek expandable cage is active local autograft mixed DBX pedicle screw fixation using the 5.5 globus Revere pedicle screw system, posterior lateral thesis L3 to L4 using local are graft mixed DBX.  Surgeon: Jillyn Hidden Aarionna Germer  Assistant: Eliane Decree  Anesthesia: Gen.  EBL: Minimal  History of present illness: Patient is a very pleasant 46 year old gentleman who's had previous L4-S1 fusion this would give her well initially however however over last several months and years is a progress worsening back pain and bilateral leg pain rating down the outside anterior margins of the size to transitions with numbness in his feet to solid fusion from L4-S1 however progressive stenosis L3-4 with evidence of instability. Due this takes her treatment MRI and imaging findings patient recommended decompressive dilatation procedure with expiration of fusion removal of hardware. One of the wrist nevus of the operation as well as the perioperative course X. medications of outcome of the surgery and he understood and agreed to proceed forward.  Operative procedure: Patient brought into the or was induced under general anesthesia positioned prone the Wilson frame his back was prepped and draped in routine sterile fashion. His old incision was opened up and extended cephalad the hardware was then dissected free and using the old fusion construct the appropriate level was identified L3-4 the TPS L3 were dissected free and after the hardware but expose this is a removed. And the fusion appeared to be solid. So the screws were removed the decompression was begun the posterior menisci stenosis  at the L3-4 level extensive eschar tissue is freed up but after completion of the decompression was begun and complete medial facetectomy 3 and 4 were skeletonized flush with pedicle and decompressed epidural veins quite with the space was identified and excessive abutting the superior tickling process of L4 LAD axes the lateral margins of disc space and complete foraminotomy. Attention was first taken placement of pedicle screws a pilot hole was drilled and L3 on the right cannulated with the awl probed O55 tap probed again a 6 5 x 45 screw inserted at L3 on the right. Fluoroscopy used the step along the way confirm Deppen trajectory asthma fashion the left side L3 was used the 2 previous holes at L4 replacement larger screws had excellent purchase. Then the interbody work was begun a distractor was inserted and the space and the right yeah using panel scrapers Epstein curettes and pituitary rongeurs the disc space was cleaned out the endplates were prepped on the left A. 11 x 22 mm expandable to 14 mm lordotic cages and insert a left-sided and opened up to approximately 14 mm this had good apposition the endplates testicular right-sided similar fashion disc spaces prepared local Arthrex packed centrally a right-sided cage was inserted and after all the implants in place via fluoroscopy confirmed good position. Then the wound was copious irrigated meticulous hemostasis was maintained aggressive decortication was care MTPs or lateral gutters the remainder the local are was packed posterior laterally 2 rods were placed top tightness tightened at L4 the L3 screws compressed the L4 and a cross it was applied. A large drain was placed after expiration the foramen to confirm patency no migration of graft material. Then the wounds closed in layers of  Vicryl and a running 4 septic or and skin benzoin Steri-Strips applied Dermabond patient recovered in stable condition at the end of the case all needle counts and sponge  counts were correct.

## 2012-04-07 NOTE — Addendum Note (Signed)
Addendum  created 04/07/12 1619 by Talton Delpriore N Mumm, CRNA   Modules edited:Anesthesia Medication Administration    
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
## 2012-04-07 NOTE — Anesthesia Preprocedure Evaluation (Addendum)
Anesthesia Evaluation  Patient identified by MRN, date of birth, ID band Patient awake    Reviewed: Allergy & Precautions, H&P , NPO status , Patient's Chart, lab work & pertinent test results, reviewed documented beta blocker date and time   Airway Mallampati: I TM Distance: >3 FB Neck ROM: Full    Dental  (+) Poor Dentition and Missing   Pulmonary asthma , sleep apnea and Continuous Positive Airway Pressure Ventilation , Current Smoker,  breath sounds clear to auscultation  Pulmonary exam normal       Cardiovascular Rhythm:Regular Rate:Normal     Neuro/Psych    GI/Hepatic GERD-  ,  Endo/Other  Diabetes mellitus-, Poorly Controlled, Oral Hypoglycemic Agents  Renal/GU      Musculoskeletal   Abdominal   Peds  Hematology   Anesthesia Other Findings   Reproductive/Obstetrics                         Anesthesia Physical Anesthesia Plan  ASA: III  Anesthesia Plan: General   Post-op Pain Management:    Induction: Intravenous  Airway Management Planned: Oral ETT  Additional Equipment:   Intra-op Plan:   Post-operative Plan: Extubation in OR  Informed Consent: I have reviewed the patients History and Physical, chart, labs and discussed the procedure including the risks, benefits and alternatives for the proposed anesthesia with the patient or authorized representative who has indicated his/her understanding and acceptance.   Dental advisory given  Plan Discussed with: Anesthesiologist and Surgeon  Anesthesia Plan Comments: (Await cardiology clearance before surgery.  0900: Pt seen by [facility] and cleared for surgery, EKG not significant note to follow. Arta Bruce MD)      Anesthesia Quick Evaluation

## 2012-04-08 LAB — GLUCOSE, CAPILLARY: Glucose-Capillary: 307 mg/dL — ABNORMAL HIGH (ref 70–99)

## 2012-04-08 MED ORDER — DOXYCYCLINE HYCLATE 100 MG PO TABS: 100.0000 mg | ORAL_TABLET | Freq: Two times a day (BID) | ORAL | Status: AC

## 2012-04-08 MED ORDER — DOXYCYCLINE HYCLATE 100 MG PO TABS
100.0000 mg | ORAL_TABLET | Freq: Two times a day (BID) | ORAL | Status: DC
  Administered 2012-04-08: 100 mg via ORAL
  Filled 2012-04-08 (×2): qty 1

## 2012-04-08 MED ORDER — HYDROMORPHONE HCL 2 MG PO TABS: 2.0000 mg | ORAL_TABLET | ORAL | Status: AC | PRN

## 2012-04-08 NOTE — Discharge Summary (Signed)
  Physician Discharge Summary  Patient ID: Ryan [name] MRN: [id number] DOB/AGE: 12-Aug-1966 46 y.o.  Admit date: 04/07/2012 Discharge date: 04/08/2012  Admission Diagnoses: Stenosis L3-4 and instability L3-4  Discharge Diagnoses: Same Active Problems:  * No active hospital problems. *    Discharged Condition: good  Hospital Course: Patient admitted hospital underwent a exploration of fusion removal of hardware L4-S1 with posterior lumbar interbody fusion L3 for postop patient did very well went to the floor on the floor was angling and voiding spontaneously tolerating regular diet and pain was well controlled on pills. Patient be discharged home scheduled followup couple days but blood during the  Consults: Significant Diagnostic Studies: Treatments: L3-4 posterior lumbar interbody fusion Discharge Exam: Blood pressure 119/89, pulse 100, temperature 98.5 F (36.9 C), temperature source Oral, resp. rate 18, SpO2 94.00%. Strength out of 5 wound clean and dry  Disposition: Home   Medication List  As of 04/08/2012  9:58 AM   TAKE these medications         albuterol 108 (90 BASE) MCG/ACT inhaler   Commonly known as: PROVENTIL HFA;VENTOLIN HFA   Inhale 2 puffs into the lungs every 6 (six) hours as needed.      atorvastatin 20 MG tablet   Commonly known as: LIPITOR   Take 20 mg by mouth at bedtime.      doxycycline 100 MG tablet   Commonly known as: VIBRA-TABS   Take 1 tablet (100 mg total) by mouth every 12 (twelve) hours.      glimepiride 4 MG tablet   Commonly known as: AMARYL   Take 8 mg by mouth daily.      HYDROmorphone 2 MG tablet   Commonly known as: DILAUDID   Take 1 tablet (2 mg total) by mouth every 4 (four) hours as needed for pain.      magnesium oxide 400 MG tablet   Commonly known as: MAG-OX   Take 400 mg by mouth daily.      metFORMIN 1000 MG tablet   Commonly known as: GLUCOPHAGE   Take 1,000 mg by mouth 2 (two) times daily with a meal.     oxycodone 30 MG immediate release tablet   Commonly known as: ROXICODONE   Take 30 mg by mouth every 6 (six) hours.      pregabalin 150 MG capsule   Commonly known as: LYRICA   Take 150 mg by mouth at bedtime.           Follow-up Information    Follow up in 3 days. (monday)          Signed: Deshannon Hinchliffe P 04/08/2012, 9:58 AM

## 2012-04-08 NOTE — Progress Notes (Signed)
D/c instructions reviewed with pt and significant other. Pt taught how to care for and empty hemovac and record amounts. Pt was able to do return demonstration of hemovac care. Pt and sig other verbalized understanding of all instructions. Copy of instructions and scripts given to pt. Pt given a container to empty drain in, to measure. Pt waiting for discharge volunteer to arrive to take him out.

## 2012-04-08 NOTE — Plan of Care (Signed)
Problem: Consults Goal: Diagnosis - Spinal Surgery Outcome: Completed/Met Date Met:  04/08/12 Thoraco/Lumbar Spine Fusion     

## 2012-04-08 NOTE — Progress Notes (Signed)
Inpatient Diabetes Program Recommendations  AACE/ADA: New Consensus Statement on Inpatient Glycemic Control (2009)  Target Ranges:  Prepandial:   less than 140 mg/dL      Peak postprandial:   less than 180 mg/dL (1-2 hours)      Critically ill patients:  140 - 180 mg/dL   Reason for Visit: Hyperglycemia in 300's range  Results for Ryan [name], Ryan [name] (MRN [id number]) as of 04/08/2012 09:14  Ref. Range 04/07/2012 08:00 04/07/2012 13:07 04/07/2012 16:48 04/07/2012 21:44 04/08/2012 07:01  Glucose-Capillary Latest Range: 70-99 mg/dL 409 (H) 811 (H) 914 (H) 335 (H) 307 (H)    Please also check HgbA1C as well.  Inpatient Diabetes Program Recommendations Insulin - Basal: Please add basal Lantus of at least 15 units daily or HS.  Correction (SSI): Please add resistant correction tidwc plus HS  Note: Thank you, Lenor Coffin, RN, CNS, Diabetes Coordinator [phone])

## 2012-04-08 NOTE — Evaluation (Signed)
Physical Therapy Evaluation Patient Details Name: Ryan [name] MRN: [id number] DOB: November 05, 1966 Today's Date: 04/08/2012  PT Assessment / Plan / Recommendation Clinical Impression  Pt s/p PLIF L3-4 requiring increased supervision for safety and supervision. Pt educated on importance of maintaining back precautions and support. No acute PT needs.     PT Assessment  Patent does not need any further PT services    Follow Up Recommendations  No PT follow up;Supervision for mobility/OOB    Barriers to Discharge        lEquipment Recommendations  None recommended by PT (has rollator at home for support)          Precautions / Restrictions Precautions Precautions: Back Precaution Booklet Issued: Yes (comment) Precaution Comments: pt educated on 3/3 back precautions Required Braces or Orthoses: Spinal Brace Spinal Brace: Lumbar corset;Applied in sitting position Restrictions Weight Bearing Restrictions: No         Mobility  Bed Mobility Bed Mobility: Not assessed Transfers Transfers: Sit to Stand;Stand to Sit Sit to Stand: 5: Supervision;With upper extremity assist;From bed Stand to Sit: 5: Supervision;With upper extremity assist;To bed Details for Transfer Assistance: VC for hand placement for safety upon standing/sitting. Pt able to control descent while bending at knees to maintain back precautions Ambulation/Gait Ambulation/Gait Assistance: 4: Min guard Ambulation Distance (Feet): 200 Feet Assistive device: Rolling walker;None Ambulation/Gait Assistance Details: RW for first half of ambulation, no AD for second half. VC for proper sequencing. Pt able to maintain balance without RW.  Gait Pattern: Step-to pattern;Decreased stride length Stairs: Yes Stairs Assistance: 5: Supervision Stairs Assistance Details (indicate cue type and reason): VC for sequencing and safety upon stairs Stair Management Technique: Two rails;Forwards Number of Stairs: 5         Visit  Information  Last PT Received On: 04/08/12 Assistance Needed: +1    Subjective Data      Prior Functioning  Home Living Lives With: Alone Available Help at Discharge: Family;Available PRN/intermittently Type of Home: Mobile home Home Access: Stairs to enter Entrance Stairs-Number of Steps: 4 Entrance Stairs-Rails: Can reach both Home Layout: One level Bathroom Shower/Tub: Walk-in shower;Door [employer] Toilet: Standard Bathroom Accessibility: Yes How Accessible: Accessible via walker Home Adaptive Equipment: Straight cane;Other (comment) (rollator) Prior Function Level of Independence: Independent Able to Take Stairs?: Yes Driving: Yes Vocation: On disability Communication Communication: No difficulties Dominant Hand: Right    Cognition  Overall Cognitive Status: Appears within functional limits for tasks assessed/performed Arousal/Alertness: Awake/alert Orientation Level: Appears intact for tasks assessed Behavior During Session: [facility] for tasks performed    Extremity/Trunk Assessment Right Lower Extremity Assessment RLE ROM/Strength/Tone: Within functional levels RLE Sensation: WFL - Light Touch Left Lower Extremity Assessment LLE ROM/Strength/Tone: Within functional levels LLE Sensation: WFL - Light Touch   Balance    End of Session PT - End of Session Equipment Utilized During Treatment: Gait belt;Back brace Activity Tolerance: Patient tolerated treatment well Patient left: in bed;with call bell/phone within reach Nurse Communication: Mobility status   Milana Kidney 04/08/2012, 10:05 AM  04/08/2012 Milana Kidney DPT PAGER: [phone] OFFICE: [phone]

## 2012-04-08 NOTE — Progress Notes (Signed)
Subjective: Patient reports Is feeling well no leg pain no burning pills a control in the back pain buster  Objective: Vital signs in last 24 hours: Temp:  [97.2 F (36.2 C)-98.6 F (37 C)] 98.5 F (36.9 C) (05/30 0900) Pulse Rate:  [88-115] 100  (05/30 0900) Resp:  [13-28] 18  (05/30 0900) BP: (105-134)/(58-89) 119/89 mmHg (05/30 0900) SpO2:  [94 %-98 %] 94 % (05/30 0900)  Intake/Output from previous day: 05/29 0701 - 05/30 0700 In: 3140 [[name].O.:640; I.V.:2300; Blood:200] Out: 3020 [Urine:2030; Drains:590; Blood:400] Intake/Output this shift:    Strength 5 out of 5 wound clean and dry  Lab Results: No results found for this basename: WBC:2,HGB:2,HCT:2,PLT:2 in the last 72 hours BMET No results found for this basename: NA:2,K:2,CL:2,CO2:2,GLUCOSE:2,BUN:2,CREATININE:2,CALCIUM:2 in the last 72 hours  Studies/Results: Dg Lumbar Spine 2-3 Views  04/07/2012  *RADIOLOGY REPORT*  Clinical Data: Extension of lumbar fusion to include L3-4.  DG C-ARM 1-60 MIN,LUMBAR SPINE - 2-3 VIEW  Comparison: CT abdomen and pelvis 05/03/2009.  Findings: New pedicle screws and stabilization bars with interbody spacer are in place at L3-4.  Old L4-S1 fusion is partially visualized. Pedicle screws and stabilization bars are no longer in place.  IMPRESSION: L3-4 PLIF.  Original Report Authenticated By: Bernadene Bell. D'ALESSIO, M.D.   Dg C-arm 1-60 Min  04/07/2012  *RADIOLOGY REPORT*  Clinical Data: Extension of lumbar fusion to include L3-4.  DG C-ARM 1-60 MIN,LUMBAR SPINE - 2-3 VIEW  Comparison: CT abdomen and pelvis 05/03/2009.  Findings: New pedicle screws and stabilization bars with interbody spacer are in place at L3-4.  Old L4-S1 fusion is partially visualized. Pedicle screws and stabilization bars are no longer in place.  IMPRESSION: L3-4 PLIF.  Original Report Authenticated By: Bernadene Bell. Ryan [name], M.D.    Assessment/Plan: Postop day 1 from plus doing very well fracture once to go home so we'll plan  discharge his drain output is still a little bit high so probably will discharge him with a drain to follow up on Monday for drain removal keep him on oral antibiotics during that time the  LOS: 1 day     Ryan [name] 04/08/2012, 9:53 AM

## 2012-04-08 NOTE — Progress Notes (Signed)
Pt came up to desk while waiting for discharge volunteer to arrive. Pt left unit ambulating with significant other, pt did not wait for wheelchair to arrive with the discharge volunteer.

## 2012-04-08 NOTE — Discharge Instructions (Signed)
No lifting no bending no twisting no driving a riding a car less come back and forth to see me.

## 2012-04-08 NOTE — Plan of Care (Signed)
Problem: Discharge Progression Outcomes Goal: Incision without S/S infection Outcome: Adequate for Discharge Dressing covering incision and supporting drain. Incision not visible.

## 2012-04-09 MED FILL — Heparin Sodium (Porcine) Inj 1000 Unit/ML: INTRAMUSCULAR | Qty: 30 | Status: AC

## 2012-04-09 MED FILL — Sodium Chloride IV Soln 0.9%: INTRAVENOUS | Qty: 1000 | Status: AC

## 2012-04-09 MED FILL — Sodium Chloride Irrigation Soln 0.9%: Qty: 3000 | Status: AC

## 2012-08-25 ENCOUNTER — Other Ambulatory Visit: Payer: Self-pay | Admitting: Neurosurgery

## 2012-08-25 DIAGNOSIS — M48061 Spinal stenosis, lumbar region without neurogenic claudication: Secondary | ICD-10-CM

## 2012-09-02 ENCOUNTER — Ambulatory Visit
Admission: RE | Admit: 2012-09-02 | Discharge: 2012-09-02 | Disposition: A | Discharge status: Home / Self Care | Payer: Medicare Other | Source: Ambulatory Visit | Attending: Neurosurgery | Admitting: Neurosurgery

## 2012-09-02 DIAGNOSIS — M48061 Spinal stenosis, lumbar region without neurogenic claudication: Secondary | ICD-10-CM

## 2012-09-03 ENCOUNTER — Ambulatory Visit
Admission: RE | Admit: 2012-09-03 | Discharge: 2012-09-03 | Disposition: A | Discharge status: Home / Self Care | Payer: Medicare Other | Source: Ambulatory Visit | Attending: Neurosurgery | Admitting: Neurosurgery

## 2012-09-03 MED ORDER — GADOBENATE DIMEGLUMINE 529 MG/ML IV SOLN
20.0000 mL | Freq: Once | INTRAVENOUS | Status: AC | PRN
  Administered 2012-09-03: 20 mL via INTRAVENOUS

## 2012-09-05 ENCOUNTER — Other Ambulatory Visit: Payer: Medicare Other

## 2017-01-01 DIAGNOSIS — M549 Dorsalgia, unspecified: Secondary | ICD-10-CM | POA: Diagnosis not present

## 2017-01-01 DIAGNOSIS — Z1389 Encounter for screening for other disorder: Secondary | ICD-10-CM | POA: Diagnosis not present

## 2017-01-01 DIAGNOSIS — G4701 Insomnia due to medical condition: Secondary | ICD-10-CM | POA: Diagnosis not present

## 2017-01-01 DIAGNOSIS — E1140 Type 2 diabetes mellitus with diabetic neuropathy, unspecified: Secondary | ICD-10-CM | POA: Diagnosis not present

## 2017-01-01 DIAGNOSIS — G8929 Other chronic pain: Secondary | ICD-10-CM | POA: Diagnosis not present

## 2017-01-12 DIAGNOSIS — M961 Postlaminectomy syndrome, not elsewhere classified: Secondary | ICD-10-CM | POA: Diagnosis not present

## 2017-01-12 DIAGNOSIS — M791 Myalgia: Secondary | ICD-10-CM | POA: Diagnosis not present

## 2017-01-12 DIAGNOSIS — G894 Chronic pain syndrome: Secondary | ICD-10-CM | POA: Diagnosis not present

## 2017-01-12 DIAGNOSIS — K219 Gastro-esophageal reflux disease without esophagitis: Secondary | ICD-10-CM | POA: Diagnosis not present

## 2017-01-12 DIAGNOSIS — Q761 Klippel-Feil syndrome: Secondary | ICD-10-CM | POA: Diagnosis not present

## 2017-01-21 DIAGNOSIS — Q761 Klippel-Feil syndrome: Secondary | ICD-10-CM | POA: Diagnosis not present

## 2017-01-21 DIAGNOSIS — M791 Myalgia: Secondary | ICD-10-CM | POA: Diagnosis not present

## 2017-01-21 DIAGNOSIS — M961 Postlaminectomy syndrome, not elsewhere classified: Secondary | ICD-10-CM | POA: Diagnosis not present

## 2017-01-21 DIAGNOSIS — G894 Chronic pain syndrome: Secondary | ICD-10-CM | POA: Diagnosis not present

## 2017-01-21 DIAGNOSIS — K219 Gastro-esophageal reflux disease without esophagitis: Secondary | ICD-10-CM | POA: Diagnosis not present

## 2017-01-29 DIAGNOSIS — Z6829 Body mass index (BMI) 29.0-29.9, adult: Secondary | ICD-10-CM | POA: Diagnosis not present

## 2017-01-29 DIAGNOSIS — T148XXA Other injury of unspecified body region, initial encounter: Secondary | ICD-10-CM | POA: Diagnosis not present

## 2017-01-29 DIAGNOSIS — E1140 Type 2 diabetes mellitus with diabetic neuropathy, unspecified: Secondary | ICD-10-CM | POA: Diagnosis not present

## 2017-02-09 DIAGNOSIS — Q761 Klippel-Feil syndrome: Secondary | ICD-10-CM | POA: Diagnosis not present

## 2017-02-09 DIAGNOSIS — G894 Chronic pain syndrome: Secondary | ICD-10-CM | POA: Diagnosis not present

## 2017-02-09 DIAGNOSIS — M791 Myalgia: Secondary | ICD-10-CM | POA: Diagnosis not present

## 2017-02-09 DIAGNOSIS — Z125 Encounter for screening for malignant neoplasm of prostate: Secondary | ICD-10-CM | POA: Diagnosis not present

## 2017-02-09 DIAGNOSIS — K219 Gastro-esophageal reflux disease without esophagitis: Secondary | ICD-10-CM | POA: Diagnosis not present

## 2017-02-09 DIAGNOSIS — M961 Postlaminectomy syndrome, not elsewhere classified: Secondary | ICD-10-CM | POA: Diagnosis not present

## 2017-02-09 DIAGNOSIS — Z13220 Encounter for screening for lipoid disorders: Secondary | ICD-10-CM | POA: Diagnosis not present

## 2017-02-09 DIAGNOSIS — E1140 Type 2 diabetes mellitus with diabetic neuropathy, unspecified: Secondary | ICD-10-CM | POA: Diagnosis not present

## 2017-03-11 DIAGNOSIS — K219 Gastro-esophageal reflux disease without esophagitis: Secondary | ICD-10-CM | POA: Diagnosis not present

## 2017-03-11 DIAGNOSIS — R69 Illness, unspecified: Secondary | ICD-10-CM | POA: Diagnosis not present

## 2017-03-11 DIAGNOSIS — Z79891 Long term (current) use of opiate analgesic: Secondary | ICD-10-CM | POA: Diagnosis not present

## 2017-03-11 DIAGNOSIS — F1120 Opioid dependence, uncomplicated: Secondary | ICD-10-CM | POA: Diagnosis not present

## 2017-03-11 DIAGNOSIS — Q761 Klippel-Feil syndrome: Secondary | ICD-10-CM | POA: Diagnosis not present

## 2017-03-11 DIAGNOSIS — M961 Postlaminectomy syndrome, not elsewhere classified: Secondary | ICD-10-CM | POA: Diagnosis not present

## 2017-03-11 DIAGNOSIS — G894 Chronic pain syndrome: Secondary | ICD-10-CM | POA: Diagnosis not present

## 2017-03-11 DIAGNOSIS — M791 Myalgia: Secondary | ICD-10-CM | POA: Diagnosis not present

## 2017-03-18 DIAGNOSIS — M961 Postlaminectomy syndrome, not elsewhere classified: Secondary | ICD-10-CM | POA: Diagnosis not present

## 2017-03-18 DIAGNOSIS — K219 Gastro-esophageal reflux disease without esophagitis: Secondary | ICD-10-CM | POA: Diagnosis not present

## 2017-03-18 DIAGNOSIS — R69 Illness, unspecified: Secondary | ICD-10-CM | POA: Diagnosis not present

## 2017-03-18 DIAGNOSIS — G894 Chronic pain syndrome: Secondary | ICD-10-CM | POA: Diagnosis not present

## 2017-03-18 DIAGNOSIS — Z79891 Long term (current) use of opiate analgesic: Secondary | ICD-10-CM | POA: Diagnosis not present

## 2017-03-18 DIAGNOSIS — Q761 Klippel-Feil syndrome: Secondary | ICD-10-CM | POA: Diagnosis not present

## 2017-03-18 DIAGNOSIS — M791 Myalgia: Secondary | ICD-10-CM | POA: Diagnosis not present

## 2017-04-08 DIAGNOSIS — Z0000 Encounter for general adult medical examination without abnormal findings: Secondary | ICD-10-CM | POA: Diagnosis not present

## 2017-04-08 DIAGNOSIS — S14109S Unspecified injury at unspecified level of cervical spinal cord, sequela: Secondary | ICD-10-CM | POA: Diagnosis not present

## 2017-04-08 DIAGNOSIS — K08409 Partial loss of teeth, unspecified cause, unspecified class: Secondary | ICD-10-CM | POA: Diagnosis not present

## 2017-04-08 DIAGNOSIS — E119 Type 2 diabetes mellitus without complications: Secondary | ICD-10-CM | POA: Diagnosis not present

## 2017-04-08 DIAGNOSIS — Z6828 Body mass index (BMI) 28.0-28.9, adult: Secondary | ICD-10-CM | POA: Diagnosis not present

## 2017-04-08 DIAGNOSIS — R531 Weakness: Secondary | ICD-10-CM | POA: Diagnosis not present

## 2017-04-08 DIAGNOSIS — M545 Low back pain: Secondary | ICD-10-CM | POA: Diagnosis not present

## 2017-04-08 DIAGNOSIS — R69 Illness, unspecified: Secondary | ICD-10-CM | POA: Diagnosis not present

## 2017-04-08 DIAGNOSIS — M542 Cervicalgia: Secondary | ICD-10-CM | POA: Diagnosis not present

## 2017-04-08 DIAGNOSIS — G4700 Insomnia, unspecified: Secondary | ICD-10-CM | POA: Diagnosis not present

## 2017-04-08 DIAGNOSIS — Z79899 Other long term (current) drug therapy: Secondary | ICD-10-CM | POA: Diagnosis not present

## 2017-04-08 DIAGNOSIS — Z79891 Long term (current) use of opiate analgesic: Secondary | ICD-10-CM | POA: Diagnosis not present

## 2017-04-10 DIAGNOSIS — M961 Postlaminectomy syndrome, not elsewhere classified: Secondary | ICD-10-CM | POA: Diagnosis not present

## 2017-04-10 DIAGNOSIS — K219 Gastro-esophageal reflux disease without esophagitis: Secondary | ICD-10-CM | POA: Diagnosis not present

## 2017-04-10 DIAGNOSIS — F1120 Opioid dependence, uncomplicated: Secondary | ICD-10-CM | POA: Diagnosis not present

## 2017-04-10 DIAGNOSIS — R69 Illness, unspecified: Secondary | ICD-10-CM | POA: Diagnosis not present

## 2017-04-10 DIAGNOSIS — Z79891 Long term (current) use of opiate analgesic: Secondary | ICD-10-CM | POA: Diagnosis not present

## 2017-04-10 DIAGNOSIS — Q761 Klippel-Feil syndrome: Secondary | ICD-10-CM | POA: Diagnosis not present

## 2017-04-10 DIAGNOSIS — M791 Myalgia: Secondary | ICD-10-CM | POA: Diagnosis not present

## 2017-04-10 DIAGNOSIS — G894 Chronic pain syndrome: Secondary | ICD-10-CM | POA: Diagnosis not present

## 2017-05-07 DIAGNOSIS — F1120 Opioid dependence, uncomplicated: Secondary | ICD-10-CM | POA: Diagnosis not present

## 2017-05-07 DIAGNOSIS — Q761 Klippel-Feil syndrome: Secondary | ICD-10-CM | POA: Diagnosis not present

## 2017-05-07 DIAGNOSIS — Z79891 Long term (current) use of opiate analgesic: Secondary | ICD-10-CM | POA: Diagnosis not present

## 2017-05-07 DIAGNOSIS — R69 Illness, unspecified: Secondary | ICD-10-CM | POA: Diagnosis not present

## 2017-05-07 DIAGNOSIS — M961 Postlaminectomy syndrome, not elsewhere classified: Secondary | ICD-10-CM | POA: Diagnosis not present

## 2017-05-07 DIAGNOSIS — M791 Myalgia: Secondary | ICD-10-CM | POA: Diagnosis not present

## 2017-05-07 DIAGNOSIS — G894 Chronic pain syndrome: Secondary | ICD-10-CM | POA: Diagnosis not present

## 2017-05-07 DIAGNOSIS — K219 Gastro-esophageal reflux disease without esophagitis: Secondary | ICD-10-CM | POA: Diagnosis not present

## 2017-06-05 DIAGNOSIS — M791 Myalgia: Secondary | ICD-10-CM | POA: Diagnosis not present

## 2017-06-05 DIAGNOSIS — K219 Gastro-esophageal reflux disease without esophagitis: Secondary | ICD-10-CM | POA: Diagnosis not present

## 2017-06-05 DIAGNOSIS — Z79891 Long term (current) use of opiate analgesic: Secondary | ICD-10-CM | POA: Diagnosis not present

## 2017-06-05 DIAGNOSIS — R69 Illness, unspecified: Secondary | ICD-10-CM | POA: Diagnosis not present

## 2017-06-05 DIAGNOSIS — G894 Chronic pain syndrome: Secondary | ICD-10-CM | POA: Diagnosis not present

## 2017-06-05 DIAGNOSIS — M961 Postlaminectomy syndrome, not elsewhere classified: Secondary | ICD-10-CM | POA: Diagnosis not present

## 2017-06-05 DIAGNOSIS — Q761 Klippel-Feil syndrome: Secondary | ICD-10-CM | POA: Diagnosis not present

## 2017-06-05 DIAGNOSIS — F1120 Opioid dependence, uncomplicated: Secondary | ICD-10-CM | POA: Diagnosis not present

## 2017-07-14 DIAGNOSIS — K219 Gastro-esophageal reflux disease without esophagitis: Secondary | ICD-10-CM | POA: Diagnosis not present

## 2017-07-14 DIAGNOSIS — M961 Postlaminectomy syndrome, not elsewhere classified: Secondary | ICD-10-CM | POA: Diagnosis not present

## 2017-07-14 DIAGNOSIS — R69 Illness, unspecified: Secondary | ICD-10-CM | POA: Diagnosis not present

## 2017-07-14 DIAGNOSIS — M791 Myalgia: Secondary | ICD-10-CM | POA: Diagnosis not present

## 2017-07-14 DIAGNOSIS — G894 Chronic pain syndrome: Secondary | ICD-10-CM | POA: Diagnosis not present

## 2017-07-14 DIAGNOSIS — Z79891 Long term (current) use of opiate analgesic: Secondary | ICD-10-CM | POA: Diagnosis not present

## 2017-07-14 DIAGNOSIS — Q761 Klippel-Feil syndrome: Secondary | ICD-10-CM | POA: Diagnosis not present

## 2017-07-29 DIAGNOSIS — M961 Postlaminectomy syndrome, not elsewhere classified: Secondary | ICD-10-CM | POA: Diagnosis not present

## 2017-08-12 DIAGNOSIS — M961 Postlaminectomy syndrome, not elsewhere classified: Secondary | ICD-10-CM | POA: Diagnosis not present

## 2017-08-12 DIAGNOSIS — F1120 Opioid dependence, uncomplicated: Secondary | ICD-10-CM | POA: Diagnosis not present

## 2017-08-12 DIAGNOSIS — G894 Chronic pain syndrome: Secondary | ICD-10-CM | POA: Diagnosis not present

## 2017-08-12 DIAGNOSIS — Z79891 Long term (current) use of opiate analgesic: Secondary | ICD-10-CM | POA: Diagnosis not present

## 2017-08-12 DIAGNOSIS — R69 Illness, unspecified: Secondary | ICD-10-CM | POA: Diagnosis not present

## 2017-08-12 DIAGNOSIS — Q761 Klippel-Feil syndrome: Secondary | ICD-10-CM | POA: Diagnosis not present

## 2017-08-12 DIAGNOSIS — M7918 Myalgia, other site: Secondary | ICD-10-CM | POA: Diagnosis not present

## 2017-08-12 DIAGNOSIS — K219 Gastro-esophageal reflux disease without esophagitis: Secondary | ICD-10-CM | POA: Diagnosis not present

## 2017-09-15 DIAGNOSIS — F1120 Opioid dependence, uncomplicated: Secondary | ICD-10-CM | POA: Diagnosis not present

## 2017-09-15 DIAGNOSIS — Z79891 Long term (current) use of opiate analgesic: Secondary | ICD-10-CM | POA: Diagnosis not present

## 2017-09-15 DIAGNOSIS — R69 Illness, unspecified: Secondary | ICD-10-CM | POA: Diagnosis not present

## 2017-09-15 DIAGNOSIS — G894 Chronic pain syndrome: Secondary | ICD-10-CM | POA: Diagnosis not present

## 2017-09-15 DIAGNOSIS — M961 Postlaminectomy syndrome, not elsewhere classified: Secondary | ICD-10-CM | POA: Diagnosis not present

## 2017-09-15 DIAGNOSIS — K219 Gastro-esophageal reflux disease without esophagitis: Secondary | ICD-10-CM | POA: Diagnosis not present

## 2017-09-15 DIAGNOSIS — Q761 Klippel-Feil syndrome: Secondary | ICD-10-CM | POA: Diagnosis not present

## 2017-10-13 DIAGNOSIS — G894 Chronic pain syndrome: Secondary | ICD-10-CM | POA: Diagnosis not present

## 2017-10-13 DIAGNOSIS — R69 Illness, unspecified: Secondary | ICD-10-CM | POA: Diagnosis not present

## 2017-10-13 DIAGNOSIS — F1120 Opioid dependence, uncomplicated: Secondary | ICD-10-CM | POA: Diagnosis not present

## 2017-10-13 DIAGNOSIS — K219 Gastro-esophageal reflux disease without esophagitis: Secondary | ICD-10-CM | POA: Diagnosis not present

## 2017-10-13 DIAGNOSIS — Z79891 Long term (current) use of opiate analgesic: Secondary | ICD-10-CM | POA: Diagnosis not present

## 2017-10-13 DIAGNOSIS — M961 Postlaminectomy syndrome, not elsewhere classified: Secondary | ICD-10-CM | POA: Diagnosis not present

## 2017-10-13 DIAGNOSIS — Q761 Klippel-Feil syndrome: Secondary | ICD-10-CM | POA: Diagnosis not present

## 2017-11-13 DIAGNOSIS — Q761 Klippel-Feil syndrome: Secondary | ICD-10-CM | POA: Diagnosis not present

## 2017-11-13 DIAGNOSIS — G894 Chronic pain syndrome: Secondary | ICD-10-CM | POA: Diagnosis not present

## 2017-11-13 DIAGNOSIS — K219 Gastro-esophageal reflux disease without esophagitis: Secondary | ICD-10-CM | POA: Diagnosis not present

## 2017-11-13 DIAGNOSIS — Z79891 Long term (current) use of opiate analgesic: Secondary | ICD-10-CM | POA: Diagnosis not present

## 2017-11-13 DIAGNOSIS — M961 Postlaminectomy syndrome, not elsewhere classified: Secondary | ICD-10-CM | POA: Diagnosis not present

## 2017-11-13 DIAGNOSIS — R69 Illness, unspecified: Secondary | ICD-10-CM | POA: Diagnosis not present

## 2017-11-13 DIAGNOSIS — F1120 Opioid dependence, uncomplicated: Secondary | ICD-10-CM | POA: Diagnosis not present

## 2017-12-14 DIAGNOSIS — Q761 Klippel-Feil syndrome: Secondary | ICD-10-CM | POA: Diagnosis not present

## 2017-12-14 DIAGNOSIS — R69 Illness, unspecified: Secondary | ICD-10-CM | POA: Diagnosis not present

## 2017-12-14 DIAGNOSIS — M961 Postlaminectomy syndrome, not elsewhere classified: Secondary | ICD-10-CM | POA: Diagnosis not present

## 2017-12-14 DIAGNOSIS — G894 Chronic pain syndrome: Secondary | ICD-10-CM | POA: Diagnosis not present

## 2017-12-14 DIAGNOSIS — K219 Gastro-esophageal reflux disease without esophagitis: Secondary | ICD-10-CM | POA: Diagnosis not present

## 2017-12-14 DIAGNOSIS — Z79891 Long term (current) use of opiate analgesic: Secondary | ICD-10-CM | POA: Diagnosis not present

## 2018-01-11 DIAGNOSIS — M961 Postlaminectomy syndrome, not elsewhere classified: Secondary | ICD-10-CM | POA: Diagnosis not present

## 2018-01-11 DIAGNOSIS — K219 Gastro-esophageal reflux disease without esophagitis: Secondary | ICD-10-CM | POA: Diagnosis not present

## 2018-01-11 DIAGNOSIS — G894 Chronic pain syndrome: Secondary | ICD-10-CM | POA: Diagnosis not present

## 2018-01-11 DIAGNOSIS — Z79891 Long term (current) use of opiate analgesic: Secondary | ICD-10-CM | POA: Diagnosis not present

## 2018-01-11 DIAGNOSIS — Q761 Klippel-Feil syndrome: Secondary | ICD-10-CM | POA: Diagnosis not present

## 2018-01-11 DIAGNOSIS — R69 Illness, unspecified: Secondary | ICD-10-CM | POA: Diagnosis not present

## 2018-01-13 DIAGNOSIS — R69 Illness, unspecified: Secondary | ICD-10-CM | POA: Diagnosis not present

## 2018-01-13 DIAGNOSIS — Q761 Klippel-Feil syndrome: Secondary | ICD-10-CM | POA: Diagnosis not present

## 2018-01-13 DIAGNOSIS — G894 Chronic pain syndrome: Secondary | ICD-10-CM | POA: Diagnosis not present

## 2018-01-13 DIAGNOSIS — Z79891 Long term (current) use of opiate analgesic: Secondary | ICD-10-CM | POA: Diagnosis not present

## 2018-01-13 DIAGNOSIS — K219 Gastro-esophageal reflux disease without esophagitis: Secondary | ICD-10-CM | POA: Diagnosis not present

## 2018-01-13 DIAGNOSIS — M961 Postlaminectomy syndrome, not elsewhere classified: Secondary | ICD-10-CM | POA: Diagnosis not present

## 2018-02-10 DIAGNOSIS — M19011 Primary osteoarthritis, right shoulder: Secondary | ICD-10-CM | POA: Diagnosis not present

## 2018-02-10 DIAGNOSIS — K219 Gastro-esophageal reflux disease without esophagitis: Secondary | ICD-10-CM | POA: Diagnosis not present

## 2018-02-10 DIAGNOSIS — Q761 Klippel-Feil syndrome: Secondary | ICD-10-CM | POA: Diagnosis not present

## 2018-02-10 DIAGNOSIS — Z79891 Long term (current) use of opiate analgesic: Secondary | ICD-10-CM | POA: Diagnosis not present

## 2018-02-10 DIAGNOSIS — R69 Illness, unspecified: Secondary | ICD-10-CM | POA: Diagnosis not present

## 2018-02-10 DIAGNOSIS — M179 Osteoarthritis of knee, unspecified: Secondary | ICD-10-CM | POA: Diagnosis not present

## 2018-02-10 DIAGNOSIS — M961 Postlaminectomy syndrome, not elsewhere classified: Secondary | ICD-10-CM | POA: Diagnosis not present

## 2018-02-10 DIAGNOSIS — G894 Chronic pain syndrome: Secondary | ICD-10-CM | POA: Diagnosis not present

## 2018-02-23 DIAGNOSIS — M1712 Unilateral primary osteoarthritis, left knee: Secondary | ICD-10-CM | POA: Diagnosis not present

## 2018-02-23 DIAGNOSIS — M179 Osteoarthritis of knee, unspecified: Secondary | ICD-10-CM | POA: Diagnosis not present

## 2018-02-23 DIAGNOSIS — M1711 Unilateral primary osteoarthritis, right knee: Secondary | ICD-10-CM | POA: Diagnosis not present

## 2018-02-23 DIAGNOSIS — M961 Postlaminectomy syndrome, not elsewhere classified: Secondary | ICD-10-CM | POA: Diagnosis not present

## 2018-03-12 DIAGNOSIS — K219 Gastro-esophageal reflux disease without esophagitis: Secondary | ICD-10-CM | POA: Diagnosis not present

## 2018-03-12 DIAGNOSIS — M961 Postlaminectomy syndrome, not elsewhere classified: Secondary | ICD-10-CM | POA: Diagnosis not present

## 2018-03-12 DIAGNOSIS — M179 Osteoarthritis of knee, unspecified: Secondary | ICD-10-CM | POA: Diagnosis not present

## 2018-03-12 DIAGNOSIS — M19011 Primary osteoarthritis, right shoulder: Secondary | ICD-10-CM | POA: Diagnosis not present

## 2018-03-12 DIAGNOSIS — Z79891 Long term (current) use of opiate analgesic: Secondary | ICD-10-CM | POA: Diagnosis not present

## 2018-03-12 DIAGNOSIS — Q761 Klippel-Feil syndrome: Secondary | ICD-10-CM | POA: Diagnosis not present

## 2018-03-12 DIAGNOSIS — G894 Chronic pain syndrome: Secondary | ICD-10-CM | POA: Diagnosis not present

## 2018-03-12 DIAGNOSIS — R69 Illness, unspecified: Secondary | ICD-10-CM | POA: Diagnosis not present

## 2018-04-12 DIAGNOSIS — M961 Postlaminectomy syndrome, not elsewhere classified: Secondary | ICD-10-CM | POA: Diagnosis not present

## 2018-04-12 DIAGNOSIS — M179 Osteoarthritis of knee, unspecified: Secondary | ICD-10-CM | POA: Diagnosis not present

## 2018-04-12 DIAGNOSIS — R69 Illness, unspecified: Secondary | ICD-10-CM | POA: Diagnosis not present

## 2018-04-12 DIAGNOSIS — K219 Gastro-esophageal reflux disease without esophagitis: Secondary | ICD-10-CM | POA: Diagnosis not present

## 2018-04-12 DIAGNOSIS — Q761 Klippel-Feil syndrome: Secondary | ICD-10-CM | POA: Diagnosis not present

## 2018-04-12 DIAGNOSIS — M19011 Primary osteoarthritis, right shoulder: Secondary | ICD-10-CM | POA: Diagnosis not present

## 2018-04-12 DIAGNOSIS — G894 Chronic pain syndrome: Secondary | ICD-10-CM | POA: Diagnosis not present

## 2018-04-12 DIAGNOSIS — Z79891 Long term (current) use of opiate analgesic: Secondary | ICD-10-CM | POA: Diagnosis not present

## 2018-05-12 DIAGNOSIS — K219 Gastro-esophageal reflux disease without esophagitis: Secondary | ICD-10-CM | POA: Diagnosis not present

## 2018-05-12 DIAGNOSIS — R69 Illness, unspecified: Secondary | ICD-10-CM | POA: Diagnosis not present

## 2018-05-12 DIAGNOSIS — M19011 Primary osteoarthritis, right shoulder: Secondary | ICD-10-CM | POA: Diagnosis not present

## 2018-05-12 DIAGNOSIS — G894 Chronic pain syndrome: Secondary | ICD-10-CM | POA: Diagnosis not present

## 2018-05-12 DIAGNOSIS — F1120 Opioid dependence, uncomplicated: Secondary | ICD-10-CM | POA: Diagnosis not present

## 2018-05-12 DIAGNOSIS — Z79891 Long term (current) use of opiate analgesic: Secondary | ICD-10-CM | POA: Diagnosis not present

## 2018-05-12 DIAGNOSIS — M961 Postlaminectomy syndrome, not elsewhere classified: Secondary | ICD-10-CM | POA: Diagnosis not present

## 2018-05-12 DIAGNOSIS — Q761 Klippel-Feil syndrome: Secondary | ICD-10-CM | POA: Diagnosis not present

## 2018-05-12 DIAGNOSIS — M179 Osteoarthritis of knee, unspecified: Secondary | ICD-10-CM | POA: Diagnosis not present

## 2018-06-11 DIAGNOSIS — G894 Chronic pain syndrome: Secondary | ICD-10-CM | POA: Diagnosis not present

## 2018-06-11 DIAGNOSIS — M961 Postlaminectomy syndrome, not elsewhere classified: Secondary | ICD-10-CM | POA: Diagnosis not present

## 2018-06-11 DIAGNOSIS — Q761 Klippel-Feil syndrome: Secondary | ICD-10-CM | POA: Diagnosis not present

## 2018-06-11 DIAGNOSIS — R69 Illness, unspecified: Secondary | ICD-10-CM | POA: Diagnosis not present

## 2018-06-11 DIAGNOSIS — K219 Gastro-esophageal reflux disease without esophagitis: Secondary | ICD-10-CM | POA: Diagnosis not present

## 2018-06-11 DIAGNOSIS — M19011 Primary osteoarthritis, right shoulder: Secondary | ICD-10-CM | POA: Diagnosis not present

## 2018-06-11 DIAGNOSIS — Z79891 Long term (current) use of opiate analgesic: Secondary | ICD-10-CM | POA: Diagnosis not present

## 2018-06-11 DIAGNOSIS — M179 Osteoarthritis of knee, unspecified: Secondary | ICD-10-CM | POA: Diagnosis not present

## 2018-06-23 DIAGNOSIS — M179 Osteoarthritis of knee, unspecified: Secondary | ICD-10-CM | POA: Diagnosis not present

## 2018-06-23 DIAGNOSIS — Q761 Klippel-Feil syndrome: Secondary | ICD-10-CM | POA: Diagnosis not present

## 2018-06-23 DIAGNOSIS — Z79891 Long term (current) use of opiate analgesic: Secondary | ICD-10-CM | POA: Diagnosis not present

## 2018-06-23 DIAGNOSIS — K219 Gastro-esophageal reflux disease without esophagitis: Secondary | ICD-10-CM | POA: Diagnosis not present

## 2018-06-23 DIAGNOSIS — G894 Chronic pain syndrome: Secondary | ICD-10-CM | POA: Diagnosis not present

## 2018-06-23 DIAGNOSIS — M19011 Primary osteoarthritis, right shoulder: Secondary | ICD-10-CM | POA: Diagnosis not present

## 2018-06-23 DIAGNOSIS — R69 Illness, unspecified: Secondary | ICD-10-CM | POA: Diagnosis not present

## 2018-06-23 DIAGNOSIS — M961 Postlaminectomy syndrome, not elsewhere classified: Secondary | ICD-10-CM | POA: Diagnosis not present

## 2018-07-08 DIAGNOSIS — M19011 Primary osteoarthritis, right shoulder: Secondary | ICD-10-CM | POA: Diagnosis not present

## 2018-07-08 DIAGNOSIS — K219 Gastro-esophageal reflux disease without esophagitis: Secondary | ICD-10-CM | POA: Diagnosis not present

## 2018-07-08 DIAGNOSIS — M5090 Cervical disc disorder, unspecified, unspecified cervical region: Secondary | ICD-10-CM | POA: Diagnosis not present

## 2018-07-08 DIAGNOSIS — Z79891 Long term (current) use of opiate analgesic: Secondary | ICD-10-CM | POA: Diagnosis not present

## 2018-07-08 DIAGNOSIS — G894 Chronic pain syndrome: Secondary | ICD-10-CM | POA: Diagnosis not present

## 2018-07-08 DIAGNOSIS — M961 Postlaminectomy syndrome, not elsewhere classified: Secondary | ICD-10-CM | POA: Diagnosis not present

## 2018-07-08 DIAGNOSIS — R69 Illness, unspecified: Secondary | ICD-10-CM | POA: Diagnosis not present

## 2018-07-08 DIAGNOSIS — Q761 Klippel-Feil syndrome: Secondary | ICD-10-CM | POA: Diagnosis not present

## 2018-07-08 DIAGNOSIS — M179 Osteoarthritis of knee, unspecified: Secondary | ICD-10-CM | POA: Diagnosis not present

## 2018-08-06 DIAGNOSIS — M5090 Cervical disc disorder, unspecified, unspecified cervical region: Secondary | ICD-10-CM | POA: Diagnosis not present

## 2018-08-06 DIAGNOSIS — K219 Gastro-esophageal reflux disease without esophagitis: Secondary | ICD-10-CM | POA: Diagnosis not present

## 2018-08-06 DIAGNOSIS — Z79891 Long term (current) use of opiate analgesic: Secondary | ICD-10-CM | POA: Diagnosis not present

## 2018-08-06 DIAGNOSIS — M961 Postlaminectomy syndrome, not elsewhere classified: Secondary | ICD-10-CM | POA: Diagnosis not present

## 2018-08-06 DIAGNOSIS — Q761 Klippel-Feil syndrome: Secondary | ICD-10-CM | POA: Diagnosis not present

## 2018-08-06 DIAGNOSIS — R69 Illness, unspecified: Secondary | ICD-10-CM | POA: Diagnosis not present

## 2018-08-06 DIAGNOSIS — G894 Chronic pain syndrome: Secondary | ICD-10-CM | POA: Diagnosis not present

## 2018-08-06 DIAGNOSIS — M179 Osteoarthritis of knee, unspecified: Secondary | ICD-10-CM | POA: Diagnosis not present

## 2018-08-06 DIAGNOSIS — M19011 Primary osteoarthritis, right shoulder: Secondary | ICD-10-CM | POA: Diagnosis not present

## 2018-09-03 DIAGNOSIS — M961 Postlaminectomy syndrome, not elsewhere classified: Secondary | ICD-10-CM | POA: Diagnosis not present

## 2018-09-03 DIAGNOSIS — Z79891 Long term (current) use of opiate analgesic: Secondary | ICD-10-CM | POA: Diagnosis not present

## 2018-09-03 DIAGNOSIS — K219 Gastro-esophageal reflux disease without esophagitis: Secondary | ICD-10-CM | POA: Diagnosis not present

## 2018-09-03 DIAGNOSIS — R69 Illness, unspecified: Secondary | ICD-10-CM | POA: Diagnosis not present

## 2018-09-03 DIAGNOSIS — M5090 Cervical disc disorder, unspecified, unspecified cervical region: Secondary | ICD-10-CM | POA: Diagnosis not present

## 2018-09-03 DIAGNOSIS — M179 Osteoarthritis of knee, unspecified: Secondary | ICD-10-CM | POA: Diagnosis not present

## 2018-09-03 DIAGNOSIS — Q761 Klippel-Feil syndrome: Secondary | ICD-10-CM | POA: Diagnosis not present

## 2018-09-03 DIAGNOSIS — M19011 Primary osteoarthritis, right shoulder: Secondary | ICD-10-CM | POA: Diagnosis not present

## 2018-09-03 DIAGNOSIS — G894 Chronic pain syndrome: Secondary | ICD-10-CM | POA: Diagnosis not present

## 2018-09-15 DIAGNOSIS — M19011 Primary osteoarthritis, right shoulder: Secondary | ICD-10-CM | POA: Diagnosis not present

## 2018-09-15 DIAGNOSIS — M47816 Spondylosis without myelopathy or radiculopathy, lumbar region: Secondary | ICD-10-CM | POA: Diagnosis not present

## 2018-09-15 DIAGNOSIS — Z79891 Long term (current) use of opiate analgesic: Secondary | ICD-10-CM | POA: Diagnosis not present

## 2018-09-15 DIAGNOSIS — M179 Osteoarthritis of knee, unspecified: Secondary | ICD-10-CM | POA: Diagnosis not present

## 2018-09-15 DIAGNOSIS — Q761 Klippel-Feil syndrome: Secondary | ICD-10-CM | POA: Diagnosis not present

## 2018-09-15 DIAGNOSIS — M961 Postlaminectomy syndrome, not elsewhere classified: Secondary | ICD-10-CM | POA: Diagnosis not present

## 2018-09-15 DIAGNOSIS — R69 Illness, unspecified: Secondary | ICD-10-CM | POA: Diagnosis not present

## 2018-09-15 DIAGNOSIS — G894 Chronic pain syndrome: Secondary | ICD-10-CM | POA: Diagnosis not present

## 2018-09-15 DIAGNOSIS — K219 Gastro-esophageal reflux disease without esophagitis: Secondary | ICD-10-CM | POA: Diagnosis not present

## 2018-09-15 DIAGNOSIS — M5090 Cervical disc disorder, unspecified, unspecified cervical region: Secondary | ICD-10-CM | POA: Diagnosis not present

## 2018-10-05 DIAGNOSIS — Z79891 Long term (current) use of opiate analgesic: Secondary | ICD-10-CM | POA: Diagnosis not present

## 2018-10-05 DIAGNOSIS — M179 Osteoarthritis of knee, unspecified: Secondary | ICD-10-CM | POA: Diagnosis not present

## 2018-10-05 DIAGNOSIS — R69 Illness, unspecified: Secondary | ICD-10-CM | POA: Diagnosis not present

## 2018-10-05 DIAGNOSIS — K219 Gastro-esophageal reflux disease without esophagitis: Secondary | ICD-10-CM | POA: Diagnosis not present

## 2018-10-05 DIAGNOSIS — Q761 Klippel-Feil syndrome: Secondary | ICD-10-CM | POA: Diagnosis not present

## 2018-10-05 DIAGNOSIS — M5090 Cervical disc disorder, unspecified, unspecified cervical region: Secondary | ICD-10-CM | POA: Diagnosis not present

## 2018-10-05 DIAGNOSIS — M19011 Primary osteoarthritis, right shoulder: Secondary | ICD-10-CM | POA: Diagnosis not present

## 2018-10-05 DIAGNOSIS — M961 Postlaminectomy syndrome, not elsewhere classified: Secondary | ICD-10-CM | POA: Diagnosis not present

## 2018-10-05 DIAGNOSIS — G894 Chronic pain syndrome: Secondary | ICD-10-CM | POA: Diagnosis not present

## 2019-01-01 DIAGNOSIS — F17210 Nicotine dependence, cigarettes, uncomplicated: Secondary | ICD-10-CM | POA: Insufficient documentation

## 2019-01-01 DIAGNOSIS — E1165 Type 2 diabetes mellitus with hyperglycemia: Secondary | ICD-10-CM | POA: Insufficient documentation

## 2019-01-01 DIAGNOSIS — E1142 Type 2 diabetes mellitus with diabetic polyneuropathy: Secondary | ICD-10-CM | POA: Insufficient documentation

## 2019-01-01 HISTORY — DX: Nicotine dependence, cigarettes, uncomplicated: F17.210

## 2019-01-01 HISTORY — DX: Type 2 diabetes mellitus with hyperglycemia: E11.65

## 2020-03-19 DIAGNOSIS — F17200 Nicotine dependence, unspecified, uncomplicated: Secondary | ICD-10-CM | POA: Diagnosis present

## 2020-03-19 HISTORY — DX: Nicotine dependence, unspecified, uncomplicated: F17.200

## 2020-04-23 DIAGNOSIS — G8929 Other chronic pain: Secondary | ICD-10-CM

## 2020-04-23 HISTORY — DX: Other chronic pain: G89.29

## 2020-05-15 DIAGNOSIS — Z89411 Acquired absence of right great toe: Secondary | ICD-10-CM | POA: Insufficient documentation

## 2020-05-15 DIAGNOSIS — I872 Venous insufficiency (chronic) (peripheral): Secondary | ICD-10-CM | POA: Insufficient documentation

## 2020-05-15 HISTORY — DX: Venous insufficiency (chronic) (peripheral): I87.2

## 2020-05-15 HISTORY — DX: Acquired absence of right great toe: Z89.411

## 2020-08-29 DIAGNOSIS — L02611 Cutaneous abscess of right foot: Secondary | ICD-10-CM | POA: Insufficient documentation

## 2020-08-29 DIAGNOSIS — L089 Local infection of the skin and subcutaneous tissue, unspecified: Secondary | ICD-10-CM

## 2020-08-29 DIAGNOSIS — L03115 Cellulitis of right lower limb: Secondary | ICD-10-CM

## 2020-08-29 HISTORY — DX: Cellulitis of right lower limb: L03.115

## 2020-08-29 HISTORY — DX: Local infection of the skin and subcutaneous tissue, unspecified: L08.9

## 2020-08-29 HISTORY — DX: Cutaneous abscess of right foot: L02.611

## 2020-09-03 DIAGNOSIS — M86471 Chronic osteomyelitis with draining sinus, right ankle and foot: Secondary | ICD-10-CM | POA: Insufficient documentation

## 2020-09-03 HISTORY — DX: Chronic osteomyelitis with draining sinus, right ankle and foot: M86.471

## 2021-01-16 DIAGNOSIS — E11621 Type 2 diabetes mellitus with foot ulcer: Secondary | ICD-10-CM | POA: Insufficient documentation

## 2021-01-16 DIAGNOSIS — L97514 Non-pressure chronic ulcer of other part of right foot with necrosis of bone: Secondary | ICD-10-CM

## 2021-01-16 HISTORY — DX: Type 2 diabetes mellitus with foot ulcer: L97.514

## 2021-01-24 DIAGNOSIS — Z89511 Acquired absence of right leg below knee: Secondary | ICD-10-CM

## 2021-01-24 HISTORY — DX: Acquired absence of right leg below knee: Z89.511

## 2021-06-05 DIAGNOSIS — T8149XA Infection following a procedure, other surgical site, initial encounter: Secondary | ICD-10-CM | POA: Insufficient documentation

## 2021-06-05 HISTORY — DX: Infection following a procedure, other surgical site, initial encounter: T81.49XA

## 2022-09-23 ENCOUNTER — Encounter (HOSPITAL_BASED_OUTPATIENT_CLINIC_OR_DEPARTMENT_OTHER): Payer: Medicare HMO | Attending: General Surgery | Admitting: General Surgery

## 2022-09-23 DIAGNOSIS — Z89511 Acquired absence of right leg below knee: Secondary | ICD-10-CM | POA: Diagnosis not present

## 2022-09-23 DIAGNOSIS — L89893 Pressure ulcer of other site, stage 3: Secondary | ICD-10-CM | POA: Diagnosis not present

## 2022-09-23 DIAGNOSIS — E11622 Type 2 diabetes mellitus with other skin ulcer: Secondary | ICD-10-CM | POA: Insufficient documentation

## 2022-09-23 DIAGNOSIS — E1140 Type 2 diabetes mellitus with diabetic neuropathy, unspecified: Secondary | ICD-10-CM | POA: Insufficient documentation

## 2022-09-23 DIAGNOSIS — K219 Gastro-esophageal reflux disease without esophagitis: Secondary | ICD-10-CM | POA: Diagnosis not present

## 2022-09-23 NOTE — Progress Notes (Signed)
ARIEN, HODGKIN ([id number]) [id number] Page 1 of 9 Visit Report for 09/23/2022 Chief Complaint Document Details Patient Name: Date of Service: Ryan LLA [name], Ryan TRICK [name]. 09/23/2022 1:30 PM Medical Record Number: [id number] Patient Account Number: [id number] Date of Birth/Sex: Treating RN: Ryan [name] (56 y.o. M) Primary Care Provider: Irven Shelling Other Clinician: Referring Provider: Treating Provider/Extender: Donley Redder in Treatment: 0 Information Obtained from: Patient Chief Complaint Patient is at the clinic for treatment of an open pressure ulcer Electronic Signature([name]) Signed: 09/23/2022 2:20:10 PM By: Fredirick Maudlin [name] FACS Entered By: Fredirick Maudlin on 09/23/2022 14:20:10 -------------------------------------------------------------------------------- Debridement Details Patient Name: Date of Service: Ryan LLA [name], Ryan TRICK [name]. 09/23/2022 1:30 PM Medical Record Number: [id number] Patient Account Number: [id number] Date of Birth/Sex: Treating RN: [birth year]Ryan [name] (56 y.o. Ryan [name] Primary Care Provider: Irven Shelling Other Clinician: Referring Provider: Treating Provider/Extender: Donley Redder in Treatment: 0 Debridement Performed for Assessment: Wound #1 Right Amputation Site - Below Knee Performed By: Physician Fredirick Maudlin, [name] Debridement Type: Debridement Severity of Tissue Pre Debridement: Fat layer exposed Level of Consciousness (Pre-procedure): Awake and Alert Pre-procedure Verification/Time Out Yes - 13:46 Taken: Start Time: 13:46 Pain Control: Lidocaine 4% T opical Solution T Area Debrided (L x W): otal 0.6 (cm) x 0.6 (cm) = 0.36 (cm) Tissue and other material debrided: Non-Viable, Eschar, Slough, Slough Level: Non-Viable Tissue Debridement Description: Selective/Open Wound Instrument: Curette Bleeding: Minimum Hemostasis Achieved: Pressure Response to Treatment:  Procedure was tolerated well Level of Consciousness (Post- Awake and Alert procedure): Post Debridement Measurements of Total Wound Length: (cm) 0.1 Width: (cm) 0.5 Depth: (cm) 0.1 Volume: (cm) 0.004 Character of Wound/Ulcer Post Debridement: Improved Severity of Tissue Post Debridement: Fat layer exposed Post Procedure Diagnosis Same as Ryan [name], Ryan [name] ([id number]) [id number] Page 2 of 9 Notes scribed for Dr. Celine Ahr by Adline Peals, RN Electronic Signature([name]) Signed: 09/23/2022 3:48:01 PM By: Adline Peals Signed: 09/23/2022 3:52:56 PM By: Fredirick Maudlin [name] FACS Entered By: Adline Peals on 09/23/2022 13:49:16 -------------------------------------------------------------------------------- HPI Details Patient Name: Date of Service: Ryan LLA [name], Ryan TRICK [name]. 09/23/2022 1:30 PM Medical Record Number: [id number] Patient Account Number: [id number] Date of Birth/Sex: Treating RN: 12-[birth year]-Ryan [name] (56 y.o. 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 The operation was performed in [name] by an orthopedic surgeon named Dr. Linton Rump. He initially did well, but once he began wearing his prosthetic, he developed an ulcer on the tibial surface. The patient reports that he has been back a number of times to his surgeon and to the prosthetist. Some adaptations have been made to the padding in his silicone sleeve, but the patient says he does not feel like there are any additional options available to him in this regard and does not think the surgeon is able to be particularly helpful. The patient was referred to the  wound care center by his primary care provider, Dr. Jannette Fogo. The patient has been applying topical antibiotic ointment and some Hydrofera Blue with a foam border dressing that he had leftover from when he had his foot infection. On inspection, there is a linear erosion on the pressure surface of his tibia. He does not have a muscle flap covering the end of his  bone, only skin. No bone is exposed. There is some built-up eschar and slough. No surrounding pressure induced tissue damage. No obvious signs of infection. Electronic Signature([name]) Signed: 09/23/2022 2:23:56 PM By: Fredirick Maudlin [name] FACS Entered By: Fredirick Maudlin on 09/23/2022 14:23:56 -------------------------------------------------------------------------------- Physical Exam Details Patient Name: Date of Service: Ryan LLA [name], Ryan TRICK [name]. 09/23/2022 1:30 PM Medical Record Number: [id number] Patient Account Number: [id number] Date of Birth/Sex: Treating RN: 31-Mar-Ryan [name] (56 y.o. M) Primary Care Provider: Irven Shelling Other Clinician: Referring Provider: Treating Provider/Extender: Donley Redder in Treatment: 0 Constitutional . . . . No acute distress. Respiratory Normal work of breathing on room air.. Notes 09/23/2022: There is a linear erosion on the pressure surface of his tibia. He does not have a muscle flap covering the end of his bone, only skin. No bone is exposed. There is some built-up eschar and slough. No surrounding pressure induced tissue damage. No obvious signs of infection. Electronic Signature([name]) Signed: 09/23/2022 2:26:12 PM By: Fredirick Maudlin [name] Tamala Fothergill ([id number]) [id number] Page 3 of 9 Entered By: Fredirick Maudlin on 09/23/2022 14:26:12 -------------------------------------------------------------------------------- Physician Orders Details Patient Name: Date of Service: Ryan LLA [name], Ryan TRICK [name]. 09/23/2022 1:30 PM Medical Record Number:  [id number] Patient Account Number: [id number] Date of Birth/Sex: Treating RN: 12-Dec-Ryan [name] (56 y.o. Ryan [name] Primary Care Provider: Irven Shelling Other Clinician: Referring Provider: Treating Provider/Extender: Donley Redder in Treatment: 0 Verbal / Phone Orders: No Diagnosis Coding ICD-10 Coding Code Description [phone] Pressure ulcer of other site, stage 3 E11.622 Type 2 diabetes mellitus with other skin ulcer Z89.511 Acquired absence of right leg below knee Follow-up Appointments ppointment in 1 week. - Dr. Celine Ahr - room 2 Return A Anesthetic (In clinic) Topical Lidocaine 4% applied to wound bed Bathing/ Shower/ Hygiene May shower and wash wound with soap and water. - with dressing changes Wound Treatment Wound #1 - Amputation Site - Below Knee Wound Laterality: Right Cleanser: Soap and Water 1 x Per Day/30 Days Discharge Instructions: May shower and wash wound with dial antibacterial soap and water prior to dressing change. Cleanser: Wound Cleanser 1 x Per Day/30 Days Discharge Instructions: Cleanse the wound with wound cleanser prior to applying a clean dressing using gauze sponges, not tissue or cotton balls. Peri-Wound Care: Skin Prep 1 x Per Day/30 Days Discharge Instructions: Use skin prep as directed Peri-Wound Care: Sween Lotion (Moisturizing lotion) 1 x Per Day/30 Days Discharge Instructions: Apply moisturizing lotion as directed Prim Dressing: Hydrofera Blue Ready Foam, 2.5 x2.5 in 1 x Per Day/30 Days ary Discharge Instructions: Apply to wound bed as instructed Secondary Dressing: ALLEVYN Heel 4 1/2in x 5 1/2in / 10.5cm x 13.5cm 1 x Per Day/30 Days Discharge Instructions: Apply over primary dressing as directed. Secondary Dressing: Zetuvit Plus Silicone Border Dressing 4x4 (in/in) 1 x Per Day/30 Days Discharge Instructions: Apply silicone border over primary dressing as directed. Secured With: 27M Medipore H Soft Cloth Surgical T  ape, 4 x 10 (in/yd) 1 x Per Day/30 Days Discharge Instructions: Secure with tape as directed. Consults Vascular Surgery - consideration of revision of below knee amputation due to inadequate muscle flap over the bone Patient Medications llergies: Tylenol A Notifications Medication Indication Start End 09/23/2022 lidocaine DOSE topical 4 % cream - cream topical Ryan [name], Ryan [name] ([id number]) [id number] Page 4 of 9 Electronic Signature([name]) Signed: 09/23/2022 2:26:38 PM By: Fredirick Maudlin [name] FACS Entered By: Fredirick Maudlin on 09/23/2022 14:26:38 Prescription 09/23/2022 -------------------------------------------------------------------------------- Ryan [name],  Ryan [name] Patient Name: Provider: 1966/06/09 [id number] Date of Birth: NPI#: Ryan [name] Sex: DEA #: [phone] Phone #: License #: [place] Patient Address: [place] [address] [place], [place] [address] [place], [place] [address] [phone] Allergies Tylenol Provider'[name] Orders Vascular Surgery - consideration of revision of below knee amputation due to inadequate muscle flap over the bone Hand Signature: Date([name]): Electronic Signature([name]) Signed: 09/23/2022 2:30:21 PM By: Fredirick Maudlin [name] FACS Entered By: Fredirick Maudlin on 09/23/2022 14:30:20 -------------------------------------------------------------------------------- Problem List Details Patient Name: Date of Service: Ryan Gerarda Fraction [name], Ryan TRICK [name]. 09/23/2022 1:30 PM Medical Record Number: [id number] Patient Account Number: [id number] Date of Birth/Sex: Treating RN: Ryan [name][birth year] (56 y.o. M) Primary Care Provider: Irven Shelling Other Clinician: Referring Provider: Treating Provider/Extender: Donley Redder in Treatment: 0 Active Problems ICD-10 Encounter Code Description Active Date MDM Diagnosis L89.893 Pressure ulcer  of other site, stage 3 09/23/2022 No Yes E11.622 Type 2 diabetes mellitus with other skin ulcer 09/23/2022 No Yes Z89.511 Acquired absence of right leg below knee 09/23/2022 No Yes Ryan [name], Ryan [name] ([id number]) [id number] Page 5 of 9 Inactive Problems Resolved Problems Electronic Signature([name]) Signed: 09/23/2022 2:19:45 PM By: Fredirick Maudlin [name] FACS Previous Signature: 09/23/2022 2:13:52 PM Version By: Fredirick Maudlin [name] FACS Entered By: Fredirick Maudlin on 09/23/2022 14:19:45 -------------------------------------------------------------------------------- Progress Note Details Patient Name: Date of Service: Ryan LLA [name], Ryan TRICK [name]. 09/23/2022 1:30 PM Medical Record Number: [id number] Patient Account Number: [id number] Date of Birth/Sex: Treating RN: Ryan [name], Ryan [name] (56 y.o. 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 The operation was performed in [facility] by an orthopedic surgeon named Dr. Linton Rump. He initially did well, but once he began wearing his prosthetic, he developed an ulcer on the tibial surface. The patient reports that he has been back a number of times to his surgeon and to the prosthetist. Some adaptations have been made to the padding in his silicone sleeve, but the patient says he does not feel like there are any additional options available to him in this regard and does not think the surgeon is able to be particularly helpful. The patient was referred to the wound care center by his primary care provider,  Dr. Jannette Fogo. The patient has been applying topical antibiotic ointment and some Hydrofera Blue with a foam border dressing that he had leftover from when he had his foot infection. On inspection, there is a linear erosion on the pressure surface of his tibia. He does not have a muscle flap covering the end of his bone, only skin. No bone is exposed. There is some built-up eschar and slough. No surrounding pressure induced tissue damage. No obvious signs of infection. Patient History Information obtained from Patient. Allergies Tylenol Family History Cancer - Siblings, Heart Disease - Mother, Hypertension - Mother, Kidney Disease - Mother, Lung Disease - Mother, Seizures - Maternal Grandparents, Stroke - Mother, No family history of Diabetes, Hereditary Spherocytosis, Thyroid Problems, Tuberculosis. Social History Current every day smoker - 1 pack a day, Marital Status - Single, Alcohol Use - Never, Drug Use - No  History, Caffeine Use - Moderate. Medical History Respiratory Patient has history of Sleep Apnea Endocrine Patient has history of Type II Diabetes Neurologic Patient has history of Neuropathy Patient is treated with Insulin, Oral Agents. Blood sugar is not tested. Hospitalization/Surgery History - right leg amputation. - lumbar fusion. - stomach surgery. - posterior fusion occiput-c2. - open heart surgery. Medical A Surgical History Notes nd Gastrointestinal GERD Musculoskeletal arthritis Review of Systems (ROS) MADIX, GATHRIGHT ([id number]) [id number] Page 6 of 9 Constitutional Symptoms (General Health) Denies complaints or symptoms of Fatigue, Fever, Chills, Marked Weight Change. Eyes Denies complaints or symptoms of Dry Eyes, Vision Changes, Glasses / Contacts. Ear/Nose/Mouth/Throat Denies complaints or symptoms of Chronic sinus problems or rhinitis. Cardiovascular Denies complaints or symptoms of Chest pain. Genitourinary Denies  complaints or symptoms of Frequent urination. Integumentary (Skin) Complains or has symptoms of Wounds. Neurologic Denies complaints or symptoms of Numbness/parasthesias. Psychiatric Denies complaints or symptoms of Claustrophobia. Objective Constitutional No acute distress. Vitals Time Taken: 1:27 PM, Height: 73 in, Source: Stated, Weight: 200 lbs, Source: Stated, BMI: 26.4, Temperature: 98.5 F, Pulse: 98 bpm, Respiratory Rate: 18 breaths/min, Blood Pressure: 127/82 mmHg. Respiratory Normal work of breathing on room air.. General Notes: 09/23/2022: There is a linear erosion on the pressure surface of his tibia. He does not have a muscle flap covering the end of his bone, only skin. No bone is exposed. There is some built-up eschar and slough. No surrounding pressure induced tissue damage. No obvious signs of infection. Integumentary (Hair, Skin) Wound #1 status is Open. Original cause of wound was Shear/Friction. The date acquired was: 05/10/2021. The wound is located on the Right Amputation Site - Below Knee. The wound measures 0.1cm length x 0.5cm width x 0.1cm depth; 0.039cm^2 area and 0.004cm^3 volume. There is Fat Layer (Subcutaneous Tissue) exposed. There is no tunneling or undermining noted. There is a medium amount of serosanguineous drainage noted. The wound margin is distinct with the outline attached to the wound base. There is large (67-100%) red granulation within the wound bed. There is a small (1-33%) amount of necrotic tissue within the wound bed including Adherent Slough. The periwound skin appearance exhibited: Callus, Dry/Scaly, Erythema. The surrounding wound skin color is noted with erythema. Periwound temperature was noted as No Abnormality. Assessment Active Problems ICD-10 Pressure ulcer of other site, stage 3 Type 2 diabetes mellitus with other skin ulcer Acquired absence of right leg below knee Procedures Wound #1 Pre-procedure diagnosis of Wound #1 is a  Diabetic Wound/Ulcer of the Lower Extremity located on the Right Amputation Site - Below Knee .Severity of Tissue Pre Debridement is: Fat layer exposed. There was a Selective/Open Wound Non-Viable Tissue Debridement with a total area of 0.36 sq cm performed by Fredirick Maudlin, [name]. With the following instrument([name]): Curette to remove Non-Viable tissue/material. Material removed includes Eschar and Slough and after achieving pain control using Lidocaine 4% Topical Solution. No specimens were taken. A time out was conducted at 13:46, prior to the start of the procedure. A Minimum amount of bleeding was controlled with Pressure. The procedure was tolerated well. Post Debridement Measurements: 0.1cm length x 0.5cm width x 0.1cm depth; 0.004cm^3 volume. Character of Wound/Ulcer Post Debridement is improved. Severity of Tissue Post Debridement is: Fat layer exposed. Post procedure Diagnosis Wound #1: Same as Pre-Procedure General Notes: scribed for Dr. Celine Ahr by Adline Peals, RN. Plan Follow-up Appointments: Return Appointment in 1 week. - Dr. Celine Ahr - room 2 Ryan [name], JOSEY ([id number]) [id number] Page 7 of 9 Anesthetic: (  In clinic) Topical Lidocaine 4% applied to wound bed Bathing/ Shower/ Hygiene: May shower and wash wound with soap and water. - with dressing changes Consults ordered were: Vascular Surgery - consideration of revision of below knee amputation due to inadequate muscle flap over the bone The following medication([name]) was prescribed: lidocaine topical 4 % cream cream topical was prescribed at facility WOUND #1: - Amputation Site - Below Knee Wound Laterality: Right Cleanser: Soap and Water 1 x Per Day/30 Days Discharge Instructions: May shower and wash wound with dial antibacterial soap and water prior to dressing change. Cleanser: Wound Cleanser 1 x Per Day/30 Days Discharge Instructions: Cleanse the wound with wound cleanser prior to applying a  clean dressing using gauze sponges, not tissue or cotton balls. Peri-Wound Care: Skin Prep 1 x Per Day/30 Days Discharge Instructions: Use skin prep as directed Peri-Wound Care: Sween Lotion (Moisturizing lotion) 1 x Per Day/30 Days Discharge Instructions: Apply moisturizing lotion as directed Prim Dressing: Hydrofera Blue Ready Foam, 2.5 x2.5 in 1 x Per Day/30 Days ary Discharge Instructions: Apply to wound bed as instructed Secondary Dressing: ALLEVYN Heel 4 1/2in x 5 1/2in / 10.5cm x 13.5cm 1 x Per Day/30 Days Discharge Instructions: Apply over primary dressing as directed. Secondary Dressing: Zetuvit Plus Silicone Border Dressing 4x4 (in/in) 1 x Per Day/30 Days Discharge Instructions: Apply silicone border over primary dressing as directed. Secured With: 32M Medipore H Soft Cloth Surgical T ape, 4 x 10 (in/yd) 1 x Per Day/30 Days Discharge Instructions: Secure with tape as directed. 09/23/2022: This is a 56 year old man who had a right BKA about a year and a half ago. Once he began wearing his prosthesis, he developed an ulcer on the tibial surface that has never completely healed. There is a linear erosion on the pressure surface of his tibia. He does not have a muscle flap covering the end of his bone, only skin. No bone is exposed. There is some built-up eschar and slough. No surrounding pressure induced tissue damage. No obvious signs of infection. I inspected the silicone sleeve of his prosthesis and he does not fact have rather generous padding. I think he is likely to require a revision of his amputation to provide more adequate bone coverage. I am concerned this will result in an above-knee amputation. The patient is unwilling to consider not using his prosthetic and says he cannot use a wheelchair or crutches. As a result, I do not think this wound is likely to heal. We will make an effort, however. I used a curette to debride slough and eschar from his wound. We will use Hydrofera  Blue and use a heel cup to provide some additional padding. We have placed a referral to our vascular surgery group to investigate the possibility of an amputation revision. He will follow-up in our clinic in 1 week'[name] time. Electronic Signature([name]) Signed: 09/23/2022 2:29:53 PM By: Duanne Guess [name] FACS Entered By: Duanne Guess on 09/23/2022 14:29:53 -------------------------------------------------------------------------------- HxROS Details Patient Name: Date of Service: Ryan LLA [name], Ryan TRICK [name]. 09/23/2022 1:30 PM Medical Record Number: [id number] Patient Account Number: [id number] Date of Birth/Sex: Treating RN: Ryan [name][birth year] (56 y.o. Marlan Palau Primary Care Provider: [name] Other Clinician: Referring Provider: Treating Provider/Extender: Inda Merlin in Treatment: 0 Information Obtained From Patient Constitutional Symptoms (General Health) Complaints and Symptoms: Negative for: Fatigue; Fever; Chills; Marked Weight Change Eyes Complaints and Symptoms: Negative for: Dry Eyes; Vision Changes; Glasses / Contacts Ear/Nose/Mouth/Throat Complaints and Symptoms: Negative for: Chronic  sinus problems or rhinitis Cardiovascular Ryan [name], Ryan [name] ([id number]) [id number] Page 8 of 9 Complaints and Symptoms: Negative for: Chest pain Genitourinary Complaints and Symptoms: Negative for: Frequent urination Integumentary (Skin) Complaints and Symptoms: Positive for: Wounds Neurologic Complaints and Symptoms: Negative for: Numbness/parasthesias Medical History: Positive for: Neuropathy Psychiatric Complaints and Symptoms: Negative for: Claustrophobia Hematologic/Lymphatic Respiratory Medical History: Positive for: Sleep Apnea Gastrointestinal Medical History: Past Medical History Notes: GERD Endocrine Medical History: Positive for: Type II Diabetes Time with diabetes: since 2002 Treated with: Insulin,  Oral agents Blood sugar tested every day: No Immunological Musculoskeletal Medical History: Past Medical History Notes: arthritis Oncologic Immunizations Pneumococcal Vaccine: Received Pneumococcal Vaccination: No Implantable Devices None Hospitalization / Surgery History Type of Hospitalization/Surgery right leg amputation lumbar fusion stomach surgery posterior fusion occiput-c2 open heart surgery Family and Social History Cancer: Yes - Siblings; Diabetes: No; Heart Disease: Yes - Mother; Hereditary Spherocytosis: No; Hypertension: Yes - Mother; Kidney Disease: Yes - Mother; Lung Disease: Yes - Mother; Seizures: Yes - Maternal Grandparents; Stroke: Yes - Mother; Thyroid Problems: No; Tuberculosis: No; Current every Ryan [name], Ryan [name] ([id number]) [id number] Page 9 of 9 day smoker - 1 pack a day; Marital Status - Single; Alcohol Use: Never; Drug Use: No History; Caffeine Use: Moderate; Financial Concerns: No; Food, Clothing or Shelter Needs: No; Support System Lacking: No; Transportation Concerns: No Electronic Signature([name]) Signed: 09/23/2022 3:48:01 PM By: Adline Peals Signed: 09/23/2022 3:52:56 PM By: Fredirick Maudlin [name] FACS Entered By: Adline Peals on 09/23/2022 13:33:56 -------------------------------------------------------------------------------- SuperBill Details Patient Name: Date of Service: Ryan LLA [name], Ryan TRICK [name]. 09/23/2022 Medical Record Number: [id number] Patient Account Number: [id number] Date of Birth/Sex: Treating RN: Ryan [name][birth year] (56 y.o. M) Primary Care Provider: Irven Shelling Other Clinician: Referring Provider: Treating Provider/Extender: Donley Redder in Treatment: 0 Diagnosis Coding ICD-10 Codes Code Description [phone] Pressure ulcer of other site, stage 3 E11.622 Type 2 diabetes mellitus with other skin ulcer Z89.511 Acquired absence of right leg below knee Facility Procedures : CPT4  Code: [id number] Description: [id number] - DEBRIDE WOUND 1ST 20 SQ CM OR < ICD-10 Diagnosis Description L89.893 Pressure ulcer of other site, stage 3 Modifier: Quantity: 1 Physician Procedures : CPT4 Code Description Modifier [id number] - WC PHYS LEVEL 4 - NEW PT 25 ICD-10 Diagnosis Description L89.893 Pressure ulcer of other site, stage 3 E11.622 Type 2 diabetes mellitus with other skin ulcer Z89.511 Acquired absence of right leg below  knee Quantity: 1 : [id number] - WC PHYS DEBR WO ANESTH 20 SQ CM ICD-10 Diagnosis Description L89.893 Pressure ulcer of other site, stage 3 Quantity: 1 Electronic Signature([name]) Signed: 09/23/2022 2:30:14 PM By: Fredirick Maudlin [name] FACS Entered By: Fredirick Maudlin on 09/23/2022 14:30:14

## 2022-09-23 NOTE — Progress Notes (Signed)
LAMOR, BONENBERGER ([id number]) [id number] Page 1 of 7 Visit Report for 09/23/2022 Allergy List Details Patient Name: Date of Service: DA LLA S, PA TRICK E. 09/23/2022 1:30 PM Medical Record Number: [id number] Patient Account Number: [id number] Date of Birth/Sex: Treating RN: 1965-12-16 (56 y.o. Janyth Contes Primary Care Jamieson Lisa: Irven Shelling Other Clinician: Referring Rockne Dearinger: Treating Jaleyah Longhi/Extender: Donley Redder in Treatment: 0 Allergies Active Allergies Tylenol Allergy Notes Electronic Signature(s) Signed: 09/23/2022 3:48:01 PM By: Sabas Sous By: Adline Peals on 09/23/2022 13:28:16 -------------------------------------------------------------------------------- Arrival Information Details Patient Name: Date of Service: DA LLA S, PA TRICK E. 09/23/2022 1:30 PM Medical Record Number: [id number] Patient Account Number: [id number] Date of Birth/Sex: Treating RN: May 14, 1966 (56 y.o. Janyth Contes Primary Care Simran Bomkamp: Irven Shelling Other Clinician: Referring Zurich Carreno: Treating Destin Kittler/Extender: Donley Redder in Treatment: 0 Visit Information Patient Arrived: Lyndel Pleasure Time: 13:27 Accompanied By: self Transfer Assistance: None Patient Identification Verified: Yes Secondary Verification Process Completed: Yes Patient Requires Transmission-Based Precautions: No Patient Has Alerts: No Electronic Signature(s) Signed: 09/23/2022 3:48:01 PM By: Adline Peals Entered By: Adline Peals on 09/23/2022 13:27:28 -------------------------------------------------------------------------------- Encounter Discharge Information Details Patient Name: Date of Service: DA LLA S, PA TRICK E. 09/23/2022 1:30 PM Medical Record Number: [id number] Patient Account Number: [id number] JOMETH, MANAOIS ([id number]) [id number] Page 2 of 7 Date  of Birth/Sex: Treating RN: Mar 15, 1966 (56 y.o. Janyth Contes Primary Care Chastidy Ranker: Other Clinician: Irven Shelling Referring Ainslie Mazurek: Treating Justyce Yeater/Extender: Donley Redder in Treatment: 0 Encounter Discharge Information Items Post Procedure Vitals Discharge Condition: Stable Temperature (F): 98.5 Ambulatory Status: Cane Pulse (bpm): 98 Discharge Destination: Home Respiratory Rate (breaths/min): 18 Transportation: Private Auto Blood Pressure (mmHg): 127/82 Accompanied By: self Schedule Follow-up Appointment: Yes Clinical Summary of Care: Patient Declined Electronic Signature(s) Signed: 09/23/2022 3:48:01 PM By: Adline Peals Entered By: Adline Peals on 09/23/2022 14:42:09 -------------------------------------------------------------------------------- Lower Extremity Assessment Details Patient Name: Date of Service: DA LLA S, PA TRICK E. 09/23/2022 1:30 PM Medical Record Number: [id number] Patient Account Number: [id number] Date of Birth/Sex: Treating RN: 05-17-1966 (56 y.o. Janyth Contes Primary Care Jesusita Jocelyn: Irven Shelling Other Clinician: Referring Biran Mayberry: Treating Tavie Haseman/Extender: Donley Redder in Treatment: 0 Electronic Signature(s) Signed: 09/23/2022 3:48:01 PM By: Sabas Sous By: Adline Peals on 09/23/2022 13:35:46 -------------------------------------------------------------------------------- Multi Wound Chart Details Patient Name: Date of Service: DA LLA S, PA TRICK E. 09/23/2022 1:30 PM Medical Record Number: [id number] Patient Account Number: [id number] Date of Birth/Sex: Treating RN: January 24, 1966 (56 y.o. M) Primary Care Zalaya Astarita: Irven Shelling Other Clinician: Referring Carnell Casamento: Treating Britten Seyfried/Extender: Donley Redder in Treatment: 0 Vital Signs Height(in): 73 Pulse(bpm): 98 Weight(lbs): 200 Blood Pressure(mmHg):  127/82 Body Mass Index(BMI): 26.4 Temperature(F): 98.5 Respiratory Rate(breaths/min): 18 [1:Photos:] [N/A:N/A] Right Amputation Site - Below Knee N/A N/A Wound Location: Shear/Friction N/A N/A Wounding Event: Diabetic Wound/Ulcer of the Lower N/A N/A Primary Etiology: Extremity Sleep Apnea, Type II Diabetes, N/A N/A Comorbid History: Neuropathy 05/10/2021 N/A N/A Date Acquired: 0 N/A N/A Weeks of Treatment: Open N/A N/A Wound Status: No N/A N/A Wound Recurrence: 0.1x0.5x0.1 N/A N/A Measurements L x W x D (cm) 0.039 N/A N/A A (cm) : rea 0.004 N/A N/A Volume (cm) : Grade 1 N/A N/A Classification: Medium N/A N/A Exudate A mount: Serosanguineous N/A N/A Exudate Type: red, brown N/A N/A Exudate Color: Distinct, outline attached N/A N/A Wound Margin: Large (67-100%) N/A N/A Granulation A mount: Red N/A N/A Granulation Quality: Small (1-33%) N/A N/A Necrotic A  mount: Fat Layer (Subcutaneous Tissue): Yes N/A N/A Exposed Structures: Fascia: No Tendon: No Muscle: No Joint: No Bone: No Large (67-100%) N/A N/A Epithelialization: Debridement - Selective/Open Wound N/A N/A Debridement: Pre-procedure Verification/Time Out 13:46 N/A N/A Taken: Lidocaine 4% Topical Solution N/A N/A Pain Control: Necrotic/Eschar, Slough N/A N/A Tissue Debrided: Non-Viable Tissue N/A N/A Level: 0.36 N/A N/A Debridement A (sq cm): rea Curette N/A N/A Instrument: Minimum N/A N/A Bleeding: Pressure N/A N/A Hemostasis A chieved: Procedure was tolerated well N/A N/A Debridement Treatment Response: 0.1x0.5x0.1 N/A N/A Post Debridement Measurements L x W x D (cm) 0.004 N/A N/A Post Debridement Volume: (cm) Callus: Yes N/A N/A Periwound Skin Texture: Dry/Scaly: Yes N/A N/A Periwound Skin Moisture: Erythema: Yes N/A N/A Periwound Skin Color: No Abnormality N/A N/A Temperature: Debridement N/A N/A Procedures Performed: Treatment Notes Electronic Signature(s) Signed:  09/23/2022 2:19:57 PM By: Fredirick Maudlin MD FACS Entered By: Fredirick Maudlin on 09/23/2022 14:19:57 -------------------------------------------------------------------------------- Multi-Disciplinary Care Plan Details Patient Name: Date of Service: DA LLA S, PA TRICK E. 09/23/2022 1:30 PM Medical Record Number: [id number] Patient Account Number: [id number] Date of Birth/Sex: Treating RN: [birth year] (56 y.o. Janyth Contes Primary Care Marty Uy: Irven Shelling Other Clinician: Referring Eleasha Cataldo: Treating Chrishaun Sasso/Extender: Donley Redder in Treatment: 0 Active Inactive Necrotic Tissue Nursing Diagnoses: TOREN, SASSER ([id number]) [id number] Page 4 of 7 Impaired tissue integrity related to necrotic/devitalized tissue Knowledge deficit related to management of necrotic/devitalized tissue Goals: Necrotic/devitalized tissue will be minimized in the wound bed Date Initiated: 09/23/2022 Target Resolution Date: 11/14/2022 Goal Status: Active Patient/caregiver will verbalize understanding of reason and process for debridement of necrotic tissue Date Initiated: 09/23/2022 Target Resolution Date: 11/14/2022 Goal Status: Active Interventions: Assess patient pain level pre-, during and post procedure and prior to discharge Provide education on necrotic tissue and debridement process Treatment Activities: Apply topical anesthetic as ordered : 09/23/2022 Notes: Wound/Skin Impairment Nursing Diagnoses: Impaired tissue integrity Knowledge deficit related to ulceration/compromised skin integrity Goals: Patient/caregiver will verbalize understanding of skin care regimen Date Initiated: 09/23/2022 Target Resolution Date: 11/14/2022 Goal Status: Active Interventions: Assess ulceration(s) every visit Treatment Activities: Skin care regimen initiated : 09/23/2022 Topical wound management initiated : 09/23/2022 Notes: Electronic  Signature(s) Signed: 09/23/2022 3:48:01 PM By: Adline Peals Entered By: Adline Peals on 09/23/2022 13:54:15 -------------------------------------------------------------------------------- Pain Assessment Details Patient Name: Date of Service: DA LLA S, PA TRICK E. 09/23/2022 1:30 PM Medical Record Number: [id number] Patient Account Number: [id number] Date of Birth/Sex: Treating RN: 25-Nov-1965 (57 y.o. Janyth Contes Primary Care Osiah Haring: Irven Shelling Other Clinician: Referring Bruce Churilla: Treating Landis Cassaro/Extender: Donley Redder in Treatment: 0 Active Problems Location of Pain Severity and Description of Pain Patient Has Paino Yes Site Locations Pain Location: FRANCO, EDMUNDSON ([id number]) [id number] Page 5 of 7 Pain Location: Pain in Ulcers Duration of the Pain. Constant / Intermittento Constant Rate the pain. Current Pain Level: 7 Character of Pain Describe the Pain: Sharp Pain Management and Medication Current Pain Management: Medication: Yes Electronic Signature(s) Signed: 09/23/2022 3:48:01 PM By: Adline Peals Entered By: Adline Peals on 09/23/2022 13:40:53 -------------------------------------------------------------------------------- Patient/Caregiver Education Details Patient Name: Date of Service: DA Michaell Cowing, PA TRICK E. 11/14/2023andnbsp1:30 PM Medical Record Number: [id number] Patient Account Number: [id number] Date of Birth/Gender: Treating RN: 04-30-1966 (56 y.o. Janyth Contes Primary Care Physician: Irven Shelling Other Clinician: Referring Physician: Treating Physician/Extender: Donley Redder in Treatment: 0 Education Assessment Education Provided To: Patient Education Topics Provided Wound Debridement: Methods: Explain/Verbal Responses: Reinforcements needed, State content  correctly Electronic Signature(s) Signed: 09/23/2022 3:48:01 PM  By: Samuella Bruin Entered By: Samuella Bruin on 09/23/2022 13:54:24 -------------------------------------------------------------------------------- Wound Assessment Details Patient Name: Date of Service: DA LLA S, PA TRICK E. 09/23/2022 1:30 PM Blanchard Kelch ([id number]) [id number] Page 6 of 7 Medical Record Number: [id number] Patient Account Number: [id number] Date of Birth/Sex: Treating RN: 1966/07/16 (56 y.o. 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 Location: Right Amputation Site - Below Knee Wound Status: Open Wounding Event: Shear/Friction Comorbid History: Sleep Apnea, Type II Diabetes, Neuropathy Date Acquired: 05/10/2021 Weeks Of Treatment: 0 Clustered Wound: No Photos Wound Measurements Length: (cm) 0.1 Width: (cm) 0.5 Depth: (cm) 0.1 Area: (cm) 0.039 Volume: (cm) 0.004 % Reduction in Area: % Reduction in Volume: Epithelialization: Large (67-100%) Tunneling: No Undermining: No Wound Description Classification: Grade 1 Wound Margin: Distinct, outline attached Exudate Amount: Medium Exudate Type: Serosanguineous Exudate Color: red, brown Foul Odor After Cleansing: No Slough/Fibrino Yes Wound Bed Granulation Amount: Large (67-100%) Exposed Structure Granulation Quality: Red Fascia Exposed: No Necrotic Amount: Small (1-33%) Fat Layer (Subcutaneous Tissue) Exposed: Yes Necrotic Quality: Adherent Slough Tendon Exposed: No Muscle Exposed: No Joint Exposed: No Bone Exposed: No Periwound Skin Texture Texture Color No Abnormalities Noted: No No Abnormalities Noted: No Callus: Yes Erythema: Yes Moisture Temperature / Pain No Abnormalities Noted: No Temperature: No Abnormality Dry /  Scaly: Yes Electronic Signature(s) Signed: 09/23/2022 3:48:01 PM By: Samuella Bruin Entered By: Samuella Bruin on 09/23/2022 13:40:18 Vitals Details -------------------------------------------------------------------------------- Blanchard Kelch ([id number]) [id number] Page 7 of 7 Patient Name: Date of Service: DA LLA S, PA TRICK E. 09/23/2022 1:30 PM Medical Record Number: [id number] Patient Account Number: [id number] Date of Birth/Sex: Treating RN: 07-21-1966 (56 y.o. Marlan Palau Primary Care Riccardo Holeman: [name] Other Clinician: Referring Josedejesus Marcum: Treating Jaki Hammerschmidt/Extender: Inda Merlin in Treatment: 0 Vital Signs Time Taken: 13:27 Temperature (F): 98.5 Height (in): 73 Pulse (bpm): 98 Source: Stated Respiratory Rate (breaths/min): 18 Weight (lbs): 200 Blood Pressure (mmHg): 127/82 Source: Stated Reference Range: 80 - 120 mg / dl Body Mass Index (BMI): 26.4 Electronic Signature(s) Signed: 09/23/2022 3:48:01 PM By: Samuella Bruin Entered By: Samuella Bruin on 09/23/2022 13:27:49

## 2022-09-23 NOTE — Progress Notes (Signed)
TRUITT, CRUEY ([id number]) [id number] Page 1 of 4 Visit Report for 09/23/2022 Abuse Risk Screen Details Patient Name: Date of Service: DA LLA S, PA TRICK E. 09/23/2022 1:30 PM Medical Record Number: [id number] Patient Account Number: [id number] Date of Birth/Sex: Treating RN: [birth year] (56 y.o. Marlan Palau Primary Care Klynn Linnemann: [name] Other Clinician: Referring Ardene Remley: Treating Markiesha Delia/Extender: Inda Merlin in Treatment: 0 Abuse Risk Screen Items Answer ABUSE RISK SCREEN: Has anyone close to you tried to hurt or harm you recentlyo No Do you feel uncomfortable with anyone in your familyo No Has anyone forced you do things that you didnt want to doo No Electronic Signature(s) Signed: 09/23/2022 3:48:01 PM By: Samuella Bruin Entered By: Samuella Bruin on 09/23/2022 13:32:38 -------------------------------------------------------------------------------- Activities of Daily Living Details Patient Name: Date of Service: DA LLA S, PA TRICK E. 09/23/2022 1:30 PM Medical Record Number: [id number] Patient Account Number: [id number] Date of Birth/Sex: Treating RN: Apr 19, 1966 (56 y.o. Marlan Palau Primary Care Sherel Fennell: [name] Other Clinician: Referring Taiwan Talcott: Treating Shamere Campas/Extender: Inda Merlin in Treatment: 0 Activities of Daily Living Items Answer Activities of Daily Living (Please select one for each item) Drive Automobile Completely Able T Medications ake Completely Able Use T elephone Completely Able Care for Appearance Completely Able Use T oilet Completely Able Bath / Shower Completely Able Dress Self Completely Able Feed Self Completely Able Walk Completely Able Get In / Out Bed Completely Able Housework Completely Able Prepare Meals Completely Able Handle Money Completely Able Shop for Self Completely Able Electronic  Signature(s) Signed: 09/23/2022 3:48:01 PM By: Samuella Bruin Entered By: Samuella Bruin on 09/23/2022 13:32:55 Humm, Charlcie Cradle ([id number]) [id number] Page 2 of 4 -------------------------------------------------------------------------------- Education Screening Details Patient Name: Date of Service: DA LLA S, PA TRICK E. 09/23/2022 1:30 PM Medical Record Number: [id number] Patient Account Number: [id number] Date of Birth/Sex: Treating RN: 05/05/1966 (56 y.o. Marlan Palau Primary Care Ashur Glatfelter: [name] Other Clinician: Referring Criss Bartles: Treating Rosealee Recinos/Extender: Inda Merlin in Treatment: 0 Primary Learner Assessed: Patient Learning Preferences/Education Level/Primary Language Learning Preference: Explanation, Demonstration, Video, Printed Material Highest Education Level: College or Above Preferred Language: English Cognitive Barrier Language Barrier: No Translator Needed: No Memory Deficit: No Emotional Barrier: No Cultural/Religious Beliefs Affecting Medical Care: No Physical Barrier Impaired Vision: No Impaired Hearing: No Decreased Hand dexterity: No Knowledge/Comprehension Knowledge Level: Medium Comprehension Level: Medium Ability to understand written instructions: Medium Ability to understand verbal instructions: Medium Motivation Anxiety Level: Calm Cooperation: Cooperative Education Importance: Acknowledges Need Interest in Health Problems: Asks Questions Perception: Coherent Willingness to Engage in Self-Management Medium Activities: Readiness to Engage in Self-Management Medium Activities: Electronic Signature(s) Signed: 09/23/2022 3:48:01 PM By: Samuella Bruin Entered By: Samuella Bruin on 09/23/2022 13:33:15 -------------------------------------------------------------------------------- Fall Risk Assessment Details Patient Name: Date of Service: DA LLA  S, PA TRICK E. 09/23/2022 1:30 PM Medical Record Number: [id number] Patient Account Number: [id number] Date of Birth/Sex: Treating RN: [birth year] (56 y.o. Marlan Palau Primary Care Analisia Kingsford: [name] Other Clinician: Referring Shanquita Ronning: Treating Kydan Shanholtzer/Extender: Inda Merlin in Treatment: 0 Fall Risk Assessment Items Have you had 2 or more falls in the last 12 monthso 0 No BURNICE, OESTREICHER E ([id number]) [phone] [id number] Page 3 of 4 Have you had any fall that resulted in injury in the last 12 monthso 0 No FALLS RISK SCREEN History of falling - immediate or within 3 months 0 No Secondary diagnosis (Do you have 2 or more medical diagnoseso) 15  Yes Ambulatory aid None/bed rest/wheelchair/nurse 0 No Crutches/cane/walker 0 No Furniture 0 No Intravenous therapy Access/Saline/Heparin Lock 0 No Gait/Transferring Normal/ bed rest/ wheelchair 0 Yes Weak (short steps with or without shuffle, stooped but able to lift head while walking, may seek 0 No support from furniture) Impaired (short steps with shuffle, may have difficulty arising from chair, head down, impaired 0 No balance) Mental Status Oriented to own ability 0 Yes Electronic Signature(s) Signed: 09/23/2022 3:48:01 PM By: Adline Peals Entered By: Adline Peals on 09/23/2022 13:33:24 -------------------------------------------------------------------------------- Foot Assessment Details Patient Name: Date of Service: DA LLA S, PA TRICK E. 09/23/2022 1:30 PM Medical Record Number: [id number] Patient Account Number: [id number] Date of Birth/Sex: Treating RN: [birth year] (56 y.o. Janyth Contes Primary Care Soundra Lampley: Irven Shelling Other Clinician: Referring Samia Kukla: Treating Letha Mirabal/Extender: Donley Redder in Treatment: 0 Foot Assessment Items Site Locations + = Sensation present, - = Sensation absent, C = Callus, U =  Ulcer R = Redness, W = Warmth, M = Maceration, PU = Pre-ulcerative lesion F = Fissure, S = Swelling, D = Dryness Assessment Right: Left: Other Deformity: No No Prior Foot Ulcer: No No Prior Amputation: Yes No Charcot Joint: No No Ambulatory Status: Ambulatory With Help Assistance Device: DRAEDYN, FORET ([id number]) [phone] [id number] Page 4 of 4 Gait: Steady Electronic Signature(s) Signed: 09/23/2022 3:48:01 PM By: Adline Peals Entered By: Adline Peals on 09/23/2022 13:34:10 -------------------------------------------------------------------------------- Nutrition Risk Screening Details Patient Name: Date of Service: DA LLA S, PA TRICK E. 09/23/2022 1:30 PM Medical Record Number: [id number] Patient Account Number: [id number] Date of Birth/Sex: Treating RN: [birth year] (56 y.o. Janyth Contes Primary Care Kaytlin Burklow: Irven Shelling Other Clinician: Referring Jazmynn Pho: Treating Kimberley Speece/Extender: Donley Redder in Treatment: 0 Height (in): 73 Weight (lbs): 200 Body Mass Index (BMI): 26.4 Nutrition Risk Screening Items Score Screening NUTRITION RISK SCREEN: I have an illness or condition that made me change the kind and/or amount of food I eat 0 No I eat fewer than two meals per day 0 No I eat few fruits and vegetables, or milk products 0 No I have three or more drinks of beer, liquor or wine almost every day 0 No I have tooth or mouth problems that make it hard for me to eat 0 No I don't always have enough money to buy the food I need 0 No I eat alone most of the time 1 Yes I take three or more different prescribed or over-the-counter drugs a day 0 No Without wanting to, I have lost or gained 10 pounds in the last six months 0 No I am not always physically able to shop, cook and/or feed myself 0 No Nutrition Protocols Good Risk Protocol 0 No interventions needed Moderate Risk Protocol High Risk  Proctocol Risk Level: Good Risk Score: 1 Electronic Signature(s) Signed: 09/23/2022 3:48:01 PM By: Adline Peals Entered By: Adline Peals on 09/23/2022 13:33:37

## 2022-10-07 ENCOUNTER — Encounter (HOSPITAL_BASED_OUTPATIENT_CLINIC_OR_DEPARTMENT_OTHER): Payer: Medicare HMO | Admitting: General Surgery

## 2022-10-07 DIAGNOSIS — E11622 Type 2 diabetes mellitus with other skin ulcer: Secondary | ICD-10-CM | POA: Diagnosis not present

## 2022-10-07 NOTE — Progress Notes (Signed)
Ryan [name] ([id number]) [id number] Page 1 of 7 Visit Report for 10/07/2022 Arrival Information Details Patient Name: Date of Service: Ryan [name], [place] Ryan [name]. 10/07/2022 2:30 PM Medical Record Number: [id number] Patient Account Number: [id number] Date of Birth/Sex: Treating RN: 01-Sep-1966 (56 y.o. Ryan [name] Primary Care Ryan [name]: Ryan [name] Other Clinician: Referring Ryan [name]: Treating Ryan [name]/Extender: Ryan [name] in Treatment: 2 Visit Information History Since Last Visit Added or deleted any medications: No Patient Arrived: Ryan [name] Any new allergies or adverse reactions: No Arrival Time: 14:37 Had a fall or experienced change in No Accompanied By: self activities of daily living that may affect Transfer Assistance: None risk of falls: Patient Identification Verified: Yes Signs or symptoms of abuse/neglect since last visito No Patient Requires Transmission-Based Precautions: No Hospitalized since last visit: No Patient Has Alerts: No Implantable device outside of the clinic excluding No cellular tissue based products placed in the center since last visit: Has Dressing in Place as Prescribed: Yes Pain Present Now: Yes Electronic Signature([name]) Signed: 10/07/2022 4:32:23 PM By: Karie Schwalbe RN Entered By: Karie Schwalbe on 10/07/2022 14:41:02 -------------------------------------------------------------------------------- Encounter Discharge Information Details Patient Name: Date of Service: Ryan [name], Ryan [name]. 10/07/2022 2:30 PM Medical Record Number: [id number] Patient Account Number: [id number] Date of Birth/Sex: Treating RN: 07/27/1966 (56 y.o. Ryan [name] Primary Care Zoua Caporaso: Ryan [name] Other Clinician: Referring Ova Gillentine: Treating Ryan [name]/Extender: Ryan [name] in Treatment: 2 Encounter Discharge Information Items Post Procedure Vitals Discharge Condition:  Stable Temperature (F): 98.2 Ambulatory Status: Cane Pulse (bpm): 99 Discharge Destination: Home Respiratory Rate (breaths/min): 16 Transportation: Private Auto Blood Pressure (mmHg): 121/78 Accompanied By: self Schedule Follow-up Appointment: Yes Clinical Summary of Care: Patient Declined Electronic Signature([name]) Signed: 10/07/2022 4:32:23 PM By: Karie Schwalbe RN Entered By: Karie Schwalbe on 10/07/2022 16:31:49 Ryan [name] ([id number]) [id number] Page 2 of 7 -------------------------------------------------------------------------------- Lower Extremity Assessment Details Patient Name: Date of Service: Ryan [name], Ryan [name]. 10/07/2022 2:30 PM Medical Record Number: [id number] Patient Account Number: [id number] Date of Birth/Sex: Treating RN: October 19, 1966 (56 y.o. Ryan [name] Primary Care Italia Wolfert: Ryan [name] Other Clinician: Referring Zafar Debrosse: Treating Ryan [name]/Extender: Ryan [name] in Treatment: 2 Electronic Signature([name]) Signed: 10/07/2022 4:32:23 PM By: Karie Schwalbe RN Entered By: Karie Schwalbe on 10/07/2022 14:42:39 -------------------------------------------------------------------------------- Multi Wound Chart Details Patient Name: Date of Service: Ryan Verdis Prime [name], Ryan [name]. 10/07/2022 2:30 PM Medical Record Number: [id number] Patient Account Number: [id number] Date of Birth/Sex: Treating RN: 06/14/1966 (56 y.o. M) Primary Care Adryanna Friedt: Ryan [name] Other Clinician: Referring Khilee Hendricksen: Treating Ryan [name]: Ryan [name] in Treatment: 2 Vital Signs Height(in): 73 Pulse(bpm): 99 Weight(lbs): 200 Blood Pressure(mmHg): 121/78 Body Mass Index(BMI): 26.4 Temperature(F): 98.2 Respiratory Rate(breaths/min): 16 [1:Photos:] [N/A:N/A] Right Amputation Site - Below Knee N/A N/A Wound Location: Pressure Injury N/A N/A Wounding Event: Diabetic Wound/Ulcer of the Lower  N/A N/A Primary Etiology: Extremity Pressure Ulcer N/A N/A Secondary Etiology: Sleep Apnea, Type II Diabetes, N/A N/A Comorbid History: Neuropathy 05/10/2021 N/A N/A Date Acquired: 2 N/A N/A Weeks of Treatment: Open N/A N/A Wound Status: No N/A N/A Wound Recurrence: 0.1x0.2x0.1 N/A N/A Measurements L x W x D (cm) 0.016 N/A N/A A (cm) : rea 0.002 N/A N/A Volume (cm) : 59.00% N/A N/A % Reduction in A rea: 50.00% N/A N/A % Reduction in Volume: Grade 1 N/A N/A Classification: Medium N/A N/A Exudate A mount: Serosanguineous N/A N/A Exudate Type: red, brown N/A N/A Exudate Color: Distinct, outline attached N/A  N/A Wound Margin: Small (1-33%) N/A N/A Granulation A mountBlanchard [name]: Ryan [name], Ryan [name] ([id number]) [id number] Page 3 of 7 Pink N/A N/A Granulation Quality: Large (67-100%) N/A N/A Necrotic Amount: Eschar, Adherent Slough N/A N/A Necrotic Tissue: Fat Layer (Subcutaneous Tissue): Yes N/A N/A Exposed Structures: Fascia: No Tendon: No Muscle: No Joint: No Bone: No Large (67-100%) N/A N/A Epithelialization: Debridement - Selective/Open Wound N/A N/A Debridement: Pre-procedure Verification/Time Out 14:49 N/A N/A Taken: Lidocaine 4% Topical Solution N/A N/A Pain Control: Necrotic/Eschar, Callus, Slough N/A N/A Tissue Debrided: Non-Viable Tissue N/A N/A Level: 0.02 N/A N/A Debridement A (sq cm): rea Curette N/A N/A Instrument: Minimum N/A N/A Bleeding: Pressure N/A N/A Hemostasis A chieved: 0 N/A N/A Procedural Pain: 0 N/A N/A Post Procedural Pain: Procedure was tolerated well N/A N/A Debridement Treatment Response: 0.1x0.2x0.1 N/A N/A Post Debridement Measurements L x W x D (cm) 0.002 N/A N/A Post Debridement Volume: (cm) Callus: Yes N/A N/A Periwound Skin Texture: Dry/Scaly: Yes N/A N/A Periwound Skin Moisture: Erythema: Yes N/A N/A Periwound Skin Color: No Abnormality N/A N/A Temperature: Debridement N/A  N/A Procedures Performed: Treatment Notes Electronic Signature([name]) Signed: 10/07/2022 3:08:40 PM By: Duanne Guessannon, Jennifer MD FACS Entered By: Duanne Guessannon, Jennifer on 10/07/2022 15:08:40 -------------------------------------------------------------------------------- Multi-Disciplinary Care Plan Details Patient Name: Date of Service: Ryan [name], Ryan [name]. 10/07/2022 2:30 PM Medical Record Number: [id number] Patient Account Number: [id number] Date of Birth/Sex: Treating RN: [birth year] (56 y.o. Ryan LimboM) Scotton, Joanne Primary Care Art Levan: Ryan CroftGeorge, Robert Other Clinician: Referring Tashema Tiller: Treating Sadat Sliwa/Extender: Ryan Merlinannon, Jennifer George, Robert Weeks in Treatment: 2 Active Inactive Necrotic Tissue Nursing Diagnoses: Impaired tissue integrity related to necrotic/devitalized tissue Knowledge deficit related to management of necrotic/devitalized tissue Goals: Necrotic/devitalized tissue will be minimized in the wound bed Date Initiated: 09/23/2022 Target Resolution Date: 11/14/2022 Goal Status: Active Patient/caregiver will verbalize understanding of reason and process for debridement of necrotic tissue Date Initiated: 09/23/2022 Target Resolution Date: 11/14/2022 Goal Status: Active Interventions: Assess patient pain level pre-, during and post procedure and prior to discharge Provide education on necrotic tissue and debridement process Treatment Activities: Ryan KelchDALLAS, Ryan [name] ([id number]) [id number] Page 4 of 7 Apply topical anesthetic as ordered : 09/23/2022 Notes: Wound/Skin Impairment Nursing Diagnoses: Impaired tissue integrity Knowledge deficit related to ulceration/compromised skin integrity Goals: Patient/caregiver will verbalize understanding of skin care regimen Date Initiated: 09/23/2022 Target Resolution Date: 11/14/2022 Goal Status: Active Interventions: Assess ulceration([name]) every visit Treatment Activities: Skin care regimen initiated :  09/23/2022 Topical wound management initiated : 09/23/2022 Notes: Electronic Signature([name]) Signed: 10/07/2022 4:32:23 PM By: Karie SchwalbeScotton, Joanne RN Entered By: Karie SchwalbeScotton, Joanne on 10/07/2022 16:30:29 -------------------------------------------------------------------------------- Pain Assessment Details Patient Name: Date of Service: Ryan Corky SingLLA [name], Ryan [name]. 10/07/2022 2:30 PM Medical Record Number: [id number] Patient Account Number: [id number] Date of Birth/Sex: Treating RN: [birth year] (56 y.o. Ryan LimboM) Scotton, Joanne Primary Care Jayona Mccaig: Ryan CroftGeorge, Robert Other Clinician: Referring Shlomie Romig: Treating Rosina Cressler/Extender: Ryan Merlinannon, Jennifer George, Robert Weeks in Treatment: 2 Active Problems Location of Pain Severity and Description of Pain Patient Has Paino Yes Site Locations Pain Location: Generalized Pain With Dressing Change: Yes Duration of the Pain. Constant / Intermittento Constant Rate the pain. Current Pain Level: 9 Worst Pain Level: 10 Least Pain Level: 5 Tolerable Pain Level: 5 Character of Pain Describe the Pain: Difficult to Pinpoint Pain Management and Medication Current Pain Management: Medication: Yes Cold Application: No Rest: Yes Massage: No Activity: No T.SandersN.[name].: No Ryan KelchDALLAS, Shontez [name] ([id number]) [id number] Page 5 of 7 Heat Application: No Leg drop or elevation: No Is the Current Pain Management Adequate:  Adequate Electronic Signature([name]) Signed: 10/07/2022 4:32:23 PM By: Karie Schwalbe RN Entered By: Karie Schwalbe on 10/07/2022 14:42:16 -------------------------------------------------------------------------------- Patient/Caregiver Education Details Patient Name: Date of Service: Ryan Corky Sing, Ryan [name]. 11/28/2023andnbsp2:30 PM Medical Record Number: [id number] Patient Account Number: [id number] Date of Birth/Gender: Treating RN: 06/18/1966 (56 y.o. Ryan [name] Primary Care Physician: Ryan [name] Other Clinician: Referring  Physician: Treating Physician/Extender: Ryan [name] in Treatment: 2 Education Assessment Education Provided To: Patient Education Topics Provided Wound/Skin Impairment: Methods: Explain/Verbal Responses: Return demonstration correctly Electronic Signature([name]) Signed: 10/07/2022 4:32:23 PM By: Karie Schwalbe RN Entered By: Karie Schwalbe on 10/07/2022 16:30:44 -------------------------------------------------------------------------------- Wound Assessment Details Patient Name: Date of Service: Ryan [name], Ryan [name]. 10/07/2022 2:30 PM Medical Record Number: [id number] Patient Account Number: [id number] Date of Birth/Sex: Treating RN: [birth year] (56 y.o. 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 Location: Right Amputation Site - Below Knee Secondary Etiology: Pressure Ulcer Wounding Event: Pressure Injury Wound Status: Open Date Acquired: 05/10/2021 Comorbid History: Sleep Apnea, Type II Diabetes, Neuropathy Weeks Of Treatment: 2 Clustered Wound: No Photos Ryan [name], Ryan [name] ([id number]) [id number] Page 6 of 7 Wound Measurements Length: (cm) 0.1 Width: (cm) 0.2 Depth: (cm) 0.1 Area: (cm) 0.016 Volume: (cm) 0.002 % Reduction in Area: 59% % Reduction in Volume: 50% Epithelialization: Large (67-100%) Tunneling: No Undermining: No Wound Description Classification: Grade 1 Wound Margin: Distinct, outline attached Exudate Amount: Medium Exudate Type: Serosanguineous Exudate Color: red, brown Foul Odor After Cleansing: No Slough/Fibrino Yes Wound Bed Granulation Amount: Small (1-33%) Exposed Structure Granulation Quality: Pink Fascia Exposed: No Necrotic Amount: Large (67-100%) Fat Layer  (Subcutaneous Tissue) Exposed: Yes Necrotic Quality: Eschar, Adherent Slough Tendon Exposed: No Muscle Exposed: No Joint Exposed: No Bone Exposed: No Periwound Skin Texture Texture Color No Abnormalities Noted: No No Abnormalities Noted: No Callus: Yes Erythema: Yes Moisture Temperature / Pain No Abnormalities Noted: No Temperature: No Abnormality Dry / Scaly: Yes Treatment Notes Wound #1 (Amputation Site - Below Knee) Wound Laterality: Right Cleanser Soap and Water Discharge Instruction: May shower and wash wound with dial antibacterial soap and water prior to dressing change. Wound Cleanser Discharge Instruction: Cleanse the wound with wound cleanser prior to applying a clean dressing using gauze sponges, not tissue or cotton balls. Peri-Wound Care Skin Prep Discharge Instruction: Use skin prep as directed Sween Lotion (Moisturizing lotion) Discharge Instruction: Apply moisturizing lotion as directed Topical Primary Dressing Hydrofera Blue Ready Foam, 2.5 x2.5 in Discharge Instruction: Apply to wound bed as instructed Secondary Dressing ALLEVYN Heel 4 1/2in x 5 1/2in / 10.5cm x 13.5cm Discharge Instruction: Apply over primary dressing as directed. Zetuvit Plus Silicone Border Dressing 4x4 (in/in) Discharge Instruction: Apply silicone border over primary dressing as directed. DILON, LANK ([id number]) [id number] Page 7 of 7 Secured With 4M Medipore H Soft Cloth Surgical T ape, 4 x 10 (in/yd) Discharge Instruction: Secure with tape as directed. Compression Wrap Compression Stockings Add-Ons Electronic Signature([name]) Signed: 10/07/2022 4:32:23 PM By: Karie Schwalbe RN Entered By: Karie Schwalbe on 10/07/2022 14:47:39 -------------------------------------------------------------------------------- Vitals Details Patient Name: Date of Service: Ryan [name], Ryan [name]. 10/07/2022 2:30 PM Medical Record Number: [id number] Patient Account  Number: [id number] Date of Birth/Sex: Treating RN: 08/02/1966 (56 y.o. Ryan [name] Primary Care Zakariyya Helfman: Ryan [name] Other Clinician: Referring Journi Moffa: Treating Aarion Metzgar/Extender: Ryan [name] in  Treatment: 2 Vital Signs Time Taken: 14:40 Temperature (F): 98.2 Height (in): 73 Pulse (bpm): 99 Weight (lbs): 200 Respiratory Rate (breaths/min): 16 Body Mass Index (BMI): 26.4 Blood Pressure (mmHg): 121/78 Reference Range: 80 - 120 mg / dl Electronic Signature([name]) Signed: 10/07/2022 4:32:23 PM By: Karie Schwalbe RN Entered By: Karie Schwalbe on 10/07/2022 14:41:31

## 2022-10-07 NOTE — Progress Notes (Signed)
RORAN, WEGNER ([id number]) [id number] Page 1 of 8 Visit Report for 10/07/2022 Chief Complaint Document Details Patient Name: Date of Service: Ryan [name], Ryan [name]. 10/07/2022 2:30 PM Medical Record Number: [id number] Patient Account Number: [id number] Date of Birth/Sex: Treating RN: [birth year] (56 y.o. M) Primary Care Provider: [name] Other Clinician: Referring Provider: Treating Provider/Extender: Inda Merlin in Treatment: 2 Information Obtained from: Patient Chief Complaint Patient is at the clinic for treatment of an open pressure ulcer Electronic Signature([name]) Signed: 10/07/2022 3:08:48 PM By: Duanne Guess MD FACS Entered By: Duanne Guess on 10/07/2022 15:08:48 -------------------------------------------------------------------------------- Debridement Details Patient Name: Date of Service: Ryan [name], Ryan [name]. 10/07/2022 2:30 PM Medical Record Number: [id number] Patient Account Number: [id number] Date of Birth/Sex: Treating RN: 05/15/1966 (56 y.o. Dianna Limbo Primary Care Provider: [name] Other Clinician: Referring Provider: Treating Provider/Extender: Inda Merlin in Treatment: 2 Debridement Performed for Assessment: Wound #1 Right Amputation Site - Below Knee Performed By: Physician Duanne Guess, MD Debridement Type: Debridement Severity of Tissue Pre Debridement: Fat layer exposed Level of Consciousness (Pre-procedure): Awake and Alert Pre-procedure Verification/Time Out Yes - 14:49 Taken: Start Time: 14:49 Pain Control: Lidocaine 4% T opical Solution T Area Debrided (L x W): otal 0.1 (cm) x 0.2 (cm) = 0.02 (cm) Tissue and other material debrided: Non-Viable, Callus, Eschar, Slough, Slough Level: Non-Viable Tissue Debridement Description: Selective/Open Wound Instrument: Curette Bleeding: Minimum Hemostasis Achieved: Pressure End Time:  14:50 Procedural Pain: 0 Post Procedural Pain: 0 Response to Treatment: Procedure was tolerated well Level of Consciousness (Post- Responds to Painful Stimuli procedure): Post Debridement Measurements of Total Wound Length: (cm) 0.1 Width: (cm) 0.2 Depth: (cm) 0.1 Volume: (cm) 0.002 Character of Wound/Ulcer Post Debridement: Improved Severity of Tissue Post Debridement: Fat layer exposed ROSEMARY, PENTECOST [name] ([id number]) [id number] Page 2 of 8 Post Procedure Diagnosis Same as Pre-procedure Notes Scribed for Dr. Lady Gary by J.Scotton Electronic Signature([name]) Signed: 10/07/2022 4:01:47 PM By: Duanne Guess MD FACS Signed: 10/07/2022 4:32:23 PM By: Karie Schwalbe RN Entered By: Karie Schwalbe on 10/07/2022 15:04:54 -------------------------------------------------------------------------------- HPI Details Patient Name: Date of Service: Ryan [name], Ryan [name]. 10/07/2022 2:30 PM Medical Record Number: [id number] Patient Account Number: [id number] Date of Birth/Sex: Treating RN: 08-Dec-1965 (56 y.o. 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 The operation was performed in [facility] by an orthopedic surgeon named Dr. Samuel Bouche. He initially did well, but once he began wearing his prosthetic, he developed an ulcer on the tibial surface. The patient reports that he has been back a number of times to his surgeon and to the prosthetist. Some adaptations have been made to the padding in his silicone sleeve, but the patient says he does not feel like there are any additional options available to him in this regard and does not think the  surgeon is able to be particularly helpful. The patient was referred to the wound care center by his primary care provider, Dr. Ardelle Park. The patient has been applying topical antibiotic ointment and some Hydrofera Blue with a foam border dressing that he had leftover from when he had his foot infection. On inspection, there is a linear erosion on the pressure surface of his tibia. He  does not have a muscle flap covering the end of his bone, only skin. No bone is exposed. There is some built-up eschar and slough. No surrounding pressure induced tissue damage. No obvious signs of infection. 10/07/2022: Apparently we had incorrect demographic information for the patient and the vascular surgery clinic has tried to call him 3 times but have not been able to reach him. His wound care supplies were also sent to an old address. He has accumulated callus around the wound which is a little bit smaller this week. There is a little slough on the surface, but no concern for infection. Electronic Signature([name]) Signed: 10/07/2022 3:09:48 PM By: Duanne Guess MD FACS Entered By: Duanne Guess on 10/07/2022 15:09:48 -------------------------------------------------------------------------------- Physical Exam Details Patient Name: Date of Service: Ryan [name], Ryan [name]. 10/07/2022 2:30 PM Medical Record Number: [id number] Patient Account Number: [id number] Date of Birth/Sex: Treating RN: 1965-11-15 (56 y.o. M) Primary Care Provider: [name] Other Clinician: Referring Provider: Treating Provider/Extender: Inda Merlin in Treatment: 2 Constitutional . . . . No acute distress. Respiratory Normal work of breathing on room air. DEWON, MENDIZABAL ([id number]) [id number] Page 3 of 8 Notes 10/07/2022: He has accumulated callus around the wound which is a little bit smaller this week. There is a little slough on the surface, but no concern  for infection. Electronic Signature([name]) Signed: 10/07/2022 3:10:24 PM By: Duanne Guess MD FACS Entered By: Duanne Guess on 10/07/2022 15:10:23 -------------------------------------------------------------------------------- Physician Orders Details Patient Name: Date of Service: Ryan [name], Ryan [name]. 10/07/2022 2:30 PM Medical Record Number: [id number] Patient Account Number: [id number] Date of Birth/Sex: Treating RN: 1966-02-23 (56 y.o. Dianna Limbo Primary Care Provider: [name] Other Clinician: Referring Provider: Treating Provider/Extender: Inda Merlin in Treatment: 2 Verbal / Phone Orders: No Diagnosis Coding ICD-10 Coding Code Description [phone] Pressure ulcer of other site, stage 3 E11.622 Type 2 diabetes mellitus with other skin ulcer Z89.511 Acquired absence of right leg below knee Follow-up Appointments ppointment in 1 week. - Dr. Lady Gary -Room 2 with [facility] Return A Anesthetic (In clinic) Topical Lidocaine 4% applied to wound bed Bathing/ Shower/ Hygiene May shower and wash wound with soap and water. - with dressing changes Wound Treatment Wound #1 - Amputation Site - Below Knee Wound Laterality: Right Cleanser: Soap and Water 1 x Per Day/30 Days Discharge Instructions: May shower and wash wound with dial antibacterial soap and water prior to dressing change. Cleanser: Wound Cleanser 1 x Per Day/30 Days Discharge Instructions: Cleanse the wound with wound cleanser prior to applying a clean dressing using gauze sponges, not tissue or cotton balls. Peri-Wound Care: Skin Prep 1 x Per Day/30 Days Discharge Instructions: Use skin prep as directed Peri-Wound Care: Sween Lotion (Moisturizing lotion) 1 x Per Day/30 Days Discharge Instructions: Apply moisturizing lotion as directed Prim Dressing: Hydrofera Blue Ready Foam, 2.5 x2.5 in 1 x Per Day/30 Days ary Discharge Instructions: Apply to wound bed as instructed Secondary  Dressing: ALLEVYN Heel 4 1/2in x 5 1/2in / 10.5cm x 13.5cm 1 x Per Day/30 Days Discharge Instructions: Apply over primary dressing as directed. Secondary Dressing: Zetuvit Plus Silicone Border Dressing 4x4 (in/in) 1 x Per Day/30 Days Discharge Instructions: Apply silicone border over primary dressing as directed. Secured With: 84M Medipore H Soft Cloth Surgical T ape, 4 x 10 (in/yd) 1 x Per Day/30 Days Discharge Instructions: Secure with tape as directed. Consults Prosthetics, Leg - Bio-T Prosthetics. Revised Prosthetic versus New Prosthesis related to  Right Distal Limb. CARMON, SAHLI ([id number]) [id number] Page 4 of 8 Electronic Signature([name]) Signed: 10/07/2022 3:10:36 PM By: Duanne Guess MD FACS Entered By: Duanne Guess on 10/07/2022 15:10:35 Prescription 10/07/2022 -------------------------------------------------------------------------------- Marlana Latus MD Patient Name: Provider: 20-Sep-1966 [id number] Date of Birth: NPI#: Judie Petit [id number] Sex: DEA #: [phone] Phone #: License #: Eligha Bridegroom [facility] Patient Address: [name] [place], [place] [address] [address] [place], [place] [address] [phone] Allergies Tylenol Provider'[name] Orders Prosthetics, Leg - Bio-T Prosthetics. Revised Prosthetic versus New Prosthesis related to Right Distal Limb. ech Hand Signature: Date([name]): Electronic Signature([name]) Signed: 10/07/2022 3:14:02 PM By: Duanne Guess MD FACS Entered By: Duanne Guess on 10/07/2022 15:14:01 -------------------------------------------------------------------------------- Problem List Details Patient Name: Date of Service: Ryan [name], Ryan [name]. 10/07/2022 2:30 PM Medical Record Number: [id number] Patient Account Number: [id number] Date of Birth/Sex: Treating RN: [birth year] (56 y.o. M) Primary Care Provider: [name] Other  Clinician: Referring Provider: Treating Provider/Extender: Inda Merlin in Treatment: 2 Active Problems ICD-10 Encounter Code Description Active Date MDM Diagnosis L89.893 Pressure ulcer of other site, stage 3 09/23/2022 No Yes E11.622 Type 2 diabetes mellitus with other skin ulcer 09/23/2022 No Yes Z89.511 Acquired absence of right leg below knee 09/23/2022 No Yes KHOA, OPDAHL ([id number]) [id number] Page 5 of 8 Inactive Problems Resolved Problems Electronic Signature([name]) Signed: 10/07/2022 3:08:35 PM By: Duanne Guess MD FACS Entered By: Duanne Guess on 10/07/2022 15:08:35 -------------------------------------------------------------------------------- Progress Note Details Patient Name: Date of Service: Ryan [name], Ryan [name]. 10/07/2022 2:30 PM Medical Record Number: [id number] Patient Account Number: [id number] Date of Birth/Sex: Treating RN: 21-Oct-1966 (56 y.o. 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 The operation was performed in [facility] by an orthopedic surgeon named Dr. Samuel Bouche. He initially did well, but once he began wearing his prosthetic, he developed an ulcer on the tibial surface. The patient reports that he has been back a number of times to his surgeon and to the prosthetist. Some adaptations have been made to the padding in his silicone sleeve, but the patient says he does not feel like there are any additional options available to him in this regard and  does not think the surgeon is able to be particularly helpful. The patient was referred to the wound care center by his primary care provider, Dr. Ardelle Park. The patient has been applying topical antibiotic ointment and some Hydrofera Blue with a foam border dressing that he had leftover from when he had his foot infection. On inspection, there is a linear erosion on the pressure surface of his tibia. He does not have a muscle flap covering the end of his bone, only skin. No bone is exposed. There is some built-up eschar and slough. No surrounding pressure induced tissue damage. No obvious signs of infection. 10/07/2022: Apparently we had incorrect demographic information for the patient and the vascular surgery clinic has tried to call him 3 times but have not been able to reach him. His wound care supplies were also sent to an old address. He has  accumulated callus around the wound which is a little bit smaller this week. There is a little slough on the surface, but no concern for infection. Patient History Information obtained from Patient. Family History Cancer - Siblings, Heart Disease - Mother, Hypertension - Mother, Kidney Disease - Mother, Lung Disease - Mother, Seizures - Maternal Grandparents, Stroke - Mother, No family history of Diabetes, Hereditary Spherocytosis, Thyroid Problems, Tuberculosis. Social History Current every day smoker - 1 pack a day, Marital Status - Single, Alcohol Use - Never, Drug Use - No History, Caffeine Use - Moderate. Medical History Respiratory Patient has history of Sleep Apnea Endocrine Patient has history of Type II Diabetes Neurologic Patient has history of Neuropathy Hospitalization/Surgery History - right leg amputation. - lumbar fusion. - stomach surgery. - posterior fusion occiput-c2. - open heart surgery. Medical A Surgical History Notes nd Gastrointestinal GERD Musculoskeletal arthritis CASIMIRO, LIENHARD ([id number])  [id number] Page 6 of 8 Objective Constitutional No acute distress. Vitals Time Taken: 2:40 PM, Height: 73 in, Weight: 200 lbs, BMI: 26.4, Temperature: 98.2 F, Pulse: 99 bpm, Respiratory Rate: 16 breaths/min, Blood Pressure: 121/78 mmHg. Respiratory Normal work of breathing on room air. General Notes: 10/07/2022: He has accumulated callus around the wound which is a little bit smaller this week. There is a little slough on the surface, but no concern for infection. Integumentary (Hair, Skin) Wound #1 status is Open. Original cause of wound was Pressure Injury. The date acquired was: 05/10/2021. The wound has been in treatment 2 weeks. The wound is located on the Right Amputation Site - Below Knee. The wound measures 0.1cm length x 0.2cm width x 0.1cm depth; 0.016cm^2 area and 0.002cm^3 volume. There is Fat Layer (Subcutaneous Tissue) exposed. There is no tunneling or undermining noted. There is a medium amount of serosanguineous drainage noted. The wound margin is distinct with the outline attached to the wound base. There is small (1-33%) pink granulation within the wound bed. There is a large (67-100%) amount of necrotic tissue within the wound bed including Eschar and Adherent Slough. The periwound skin appearance exhibited: Callus, Dry/Scaly, Erythema. The surrounding wound skin color is noted with erythema. Periwound temperature was noted as No Abnormality. Assessment Active Problems ICD-10 Pressure ulcer of other site, stage 3 Type 2 diabetes mellitus with other skin ulcer Acquired absence of right leg below knee Procedures Wound #1 Pre-procedure diagnosis of Wound #1 is a Diabetic Wound/Ulcer of the Lower Extremity located on the Right Amputation Site - Below Knee .Severity of Tissue Pre Debridement is: Fat layer exposed. There was a Selective/Open Wound Non-Viable Tissue Debridement with a total area of 0.02 sq cm performed by Duanne Guess, MD. With  the following instrument([name]): Curette to remove Non-Viable tissue/material. Material removed includes Eschar, Callus, and Slough after achieving pain control using Lidocaine 4% Topical Solution. No specimens were taken. A time out was conducted at 14:49, prior to the start of the procedure. A Minimum amount of bleeding was controlled with Pressure. The procedure was tolerated well with a pain level of 0 throughout and a pain level of 0 following the procedure. Post Debridement Measurements: 0.1cm length x 0.2cm width x 0.1cm depth; 0.002cm^3 volume. Character of Wound/Ulcer Post Debridement is improved. Severity of Tissue Post Debridement is: Fat layer exposed. Post procedure Diagnosis Wound #1: Same as Pre-Procedure General Notes: Scribed for Dr. Lady Gary by J.Scotton. Plan Follow-up Appointments: Return Appointment in 1 week. - Dr. Lady Gary -Room 2 with [facility] Anesthetic: (In clinic) Topical Lidocaine 4% applied to wound bed  Bathing/ Shower/ Hygiene: May shower and wash wound with soap and water. - with dressing changes Consults ordered were: Prosthetics, Leg - Bio-T Prosthetics. Revised Prosthetic versus New Prosthesis related to Right Distal Limb. ech WOUND #1: - Amputation Site - Below Knee Wound Laterality: Right Cleanser: Soap and Water 1 x Per Day/30 Days Discharge Instructions: May shower and wash wound with dial antibacterial soap and water prior to dressing change. Cleanser: Wound Cleanser 1 x Per Day/30 Days Discharge Instructions: Cleanse the wound with wound cleanser prior to applying a clean dressing using gauze sponges, not tissue or cotton balls. Peri-Wound Care: Skin Prep 1 x Per Day/30 Days Discharge Instructions: Use skin prep as directed Peri-Wound Care: Sween Lotion (Moisturizing lotion) 1 x Per Day/30 Days Discharge Instructions: Apply moisturizing lotion as directed Prim Dressing: Hydrofera Blue Ready Foam, 2.5 x2.5 in 1 x Per Day/30 Days ary Discharge Instructions:  Apply to wound bed as instructed Secondary Dressing: ALLEVYN Heel 4 1/2in x 5 1/2in / 10.5cm x 13.5cm 1 x Per Day/30 Days Discharge Instructions: Apply over primary dressing as directed. Secondary Dressing: Zetuvit Plus Silicone Border Dressing 4x4 (in/in) 1 x Per Day/30 Days Discharge Instructions: Apply silicone border over primary dressing as directed. Secured With: 19M Medipore H Soft Cloth Surgical T ape, 4 x 10 (in/yd) 1 x Per Day/30 Days TEDDY, REBSTOCK ([id number]) [id number] Page 7 of 8 Discharge Instructions: Secure with tape as directed. 10/07/2022: He has accumulated callus around the wound which is a little bit smaller this week. There is a little slough on the surface, but no concern for infection. I used a curette to debride the callus, eschar, and slough from his wound. We will continue Hydrofera Blue with a heel cup. We have provided him the telephone number for the vascular surgery clinic so that he may contact them and set up a consultation. In addition, we have provided a referral to an alternative prosthetics provider to see if any revisions can be made to his existing prosthetic limb or if he needs a new one. Follow-up in 1 week. Electronic Signature([name]) Signed: 10/07/2022 3:13:23 PM By: Duanne Guess MD FACS Entered By: Duanne Guess on 10/07/2022 15:13:23 -------------------------------------------------------------------------------- HxROS Details Patient Name: Date of Service: Ryan [name], Ryan [name]. 10/07/2022 2:30 PM Medical Record Number: [id number] Patient Account Number: [id number] Date of Birth/Sex: Treating RN: 12-18-1965 (55 y.o. M) Primary Care Provider: [name] Other Clinician: Referring Provider: Treating Provider/Extender: Inda Merlin in Treatment: 2 Information Obtained From Patient Respiratory Medical History: Positive for: Sleep Apnea Gastrointestinal Medical History: Past  Medical History Notes: GERD Endocrine Medical History: Positive for: Type II Diabetes Time with diabetes: since 2002 Treated with: Insulin, Oral agents Blood sugar tested every day: No Musculoskeletal Medical History: Past Medical History Notes: arthritis Neurologic Medical History: Positive for: Neuropathy Immunizations Pneumococcal Vaccine: Received Pneumococcal Vaccination: No Implantable Devices None Hospitalization / Surgery History RUSLAN, MCCABE ([id number]) [id number] Page 8 of 8 Type of Hospitalization/Surgery right leg amputation lumbar fusion stomach surgery posterior fusion occiput-c2 open heart surgery Family and Social History Cancer: Yes - Siblings; Diabetes: No; Heart Disease: Yes - Mother; Hereditary Spherocytosis: No; Hypertension: Yes - Mother; Kidney Disease: Yes - Mother; Lung Disease: Yes - Mother; Seizures: Yes - Maternal Grandparents; Stroke: Yes - Mother; Thyroid Problems: No; Tuberculosis: No; Current every day smoker - 1 pack a day; Marital Status - Single; Alcohol Use: Never; Drug Use: No History; Caffeine Use: Moderate; Financial Concerns: No; Food, [employer] fast streamer or Shelter  Needs: No; Support System Lacking: No; Transportation Concerns: No Electronic Signature([name]) Signed: 10/07/2022 4:01:47 PM By: Duanne Guessannon, Kindall Swaby MD FACS Entered By: Duanne Guessannon, Max Romano on 10/07/2022 15:09:53 -------------------------------------------------------------------------------- SuperBill Details Patient Name: Date of Service: Ryan [name], Ryan [name]. 10/07/2022 Medical Record Number: [id number] Patient Account Number: [id number] Date of Birth/Sex: Treating RN: Apr 15, 1966 (56 y.o. M) Primary Care Provider: Clelia CroftGeorge, Robert Other Clinician: Referring Provider: Treating Provider/Extender: Inda Merlinannon, Kaia Depaolis George, Robert Weeks in Treatment: 2 Diagnosis Coding ICD-10 Codes Code Description [phone] [id number] Pressure ulcer of other site, stage 3 E11.622 Type 2  diabetes mellitus with other skin ulcer Z89.511 Acquired absence of right leg below knee Facility Procedures : CPT4 Code: [id number] Description: 97597 - DEBRIDE WOUND 1ST 20 SQ CM OR < ICD-10 Diagnosis Description L89.893 Pressure ulcer of other site, stage 3 Modifier: Quantity: 1 Physician Procedures : CPT4 Code Description Modifier [id number] 99214 - WC PHYS LEVEL 4 - EST PT 25 ICD-10 Diagnosis Description L89.893 Pressure ulcer of other site, 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

## 2022-10-16 ENCOUNTER — Encounter (HOSPITAL_BASED_OUTPATIENT_CLINIC_OR_DEPARTMENT_OTHER): Payer: Medicare HMO | Attending: General Surgery | Admitting: General Surgery

## 2022-10-16 DIAGNOSIS — Z872 Personal history of diseases of the skin and subcutaneous tissue: Secondary | ICD-10-CM | POA: Insufficient documentation

## 2022-10-16 DIAGNOSIS — Z89511 Acquired absence of right leg below knee: Secondary | ICD-10-CM | POA: Insufficient documentation

## 2022-10-16 DIAGNOSIS — E119 Type 2 diabetes mellitus without complications: Secondary | ICD-10-CM | POA: Diagnosis not present

## 2022-10-16 DIAGNOSIS — Z09 Encounter for follow-up examination after completed treatment for conditions other than malignant neoplasm: Secondary | ICD-10-CM | POA: Diagnosis not present

## 2022-10-17 NOTE — Progress Notes (Signed)
WALDEN, STATZ ([id number]) [id number] Page 1 of 6 Visit Report for 10/16/2022 Chief Complaint Document Details Patient Name: Date of Service: Ryan [name], Ryan [name]. 10/16/2022 2:00 PM Medical Record Number: [id number] Patient Account Number: [id number] Date of Birth/Sex: Treating RN: 01-Feb-1966 (56 y.o. M) Primary Care Provider: [name] Other Clinician: Referring Provider: Treating Provider/Extender: Inda Merlin in Treatment: 3 Information Obtained from: Patient Chief Complaint Patient is at the clinic for treatment of an open pressure ulcer Electronic Signature([name]) Signed: 10/16/2022 2:43:27 PM By: Duanne Guess MD FACS Entered By: Duanne Guess on 10/16/2022 14:43:26 -------------------------------------------------------------------------------- HPI Details Patient Name: Date of Service: Ryan [name], Ryan [name]. 10/16/2022 2:00 PM Medical Record Number: [id number] Patient Account Number: [id number] Date of Birth/Sex: Treating RN: Jan 20, 1966 (56 y.o. 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 The operation was performed in [facility] by an orthopedic surgeon named Dr. Samuel Bouche. He initially did well, but once he began wearing his prosthetic, he developed an ulcer on the tibial surface. The patient reports that he has been back a number of times to his surgeon and to the prosthetist. Some adaptations have been made to the padding in his silicone sleeve, but the patient says he does not feel like there are any additional options available to him in this regard and does not think the surgeon is able  to be particularly helpful. The patient was referred to the wound care center by his primary care provider, Dr. Ardelle Park. The patient has been applying topical antibiotic ointment and some Hydrofera Blue with a foam border dressing that he had leftover from when he had his foot infection. On inspection, there is a linear erosion on the pressure surface of his tibia. He does not have a muscle flap covering the end of his bone, only skin. No bone is exposed. There is some built-up eschar and slough. No surrounding pressure induced tissue damage. No obvious signs of infection. 10/07/2022: Apparently we had incorrect demographic information for the patient and the vascular surgery clinic has tried to call him 3 times but have not been able to reach him. His wound care supplies were also sent to an old address. He has accumulated callus around the wound which is a little bit smaller this week. There is a little slough on the surface, but no concern for infection. 10/16/2022: His wound is healed. He has made no effort to reach out to vascular surgery regarding amputation revision; he says that he really does not want to undergo another operation. He has also been provided with contact information for several other prosthetic providers, but has not communicated with any of them either. Electronic Signature([name]) Signed: 10/16/2022 2:44:25 PM By: Duanne Guess MD FACS Entered By: Duanne Guess on 10/16/2022 14:44:25 Blanchard Kelch ([id number]) [id number] Page 2 of 6 -------------------------------------------------------------------------------- Physical Exam Details Patient Name: Date of Service: Ryan [name], Ryan [name]. 10/16/2022 2:00 PM Medical Record Number: [id number] Patient Account Number: [id number] Date of Birth/Sex: Treating RN: 09-Jul-1966 (56 y.o. M) Primary Care Provider: [name] Other Clinician: Referring Provider: Treating Provider/Extender: Inda Merlin in Treatment: 3 Constitutional . Slightly tachycardic, asymptomatic.. . . No acute distress. Respiratory Normal work of  breathing on room air. Notes 10/16/2022: His wound is healed. Electronic Signature([name]) Signed: 10/16/2022 2:44:58 PM By: Duanne Guess MD FACS Entered By: Duanne Guess on 10/16/2022 14:44:58 -------------------------------------------------------------------------------- Physician Orders Details Patient Name: Date of Service: Ryan [name], Ryan [name]. 10/16/2022 2:00 PM Medical Record Number: [id number] Patient Account Number: [id number] Date of Birth/Sex: Treating RN: 1966-09-26 (56 y.o. Valma Cava Primary Care Provider: [name] Other Clinician: Referring Provider: Treating Provider/Extender: Inda Merlin in Treatment: 3 Verbal / Phone Orders: No Diagnosis Coding ICD-10 Coding Code Description [phone] Pressure ulcer of other site, stage 3 E11.622 Type 2 diabetes mellitus with other skin ulcer Z89.511 Acquired absence of right leg below knee Discharge From [facility] Discharge from Wound Care Center - Congratulations!! Please reach out to the company listed below to have modifications made to your prosthetic. [employer]- [phone] [name], [place], [place] [address] Bathing/ Shower/ Hygiene May shower and wash wound with soap and water. Electronic Signature([name]) Signed: 10/16/2022 2:45:13 PM By: Duanne Guess MD FACS Entered By: Duanne Guess on 10/16/2022 14:45:13 Blanchard Kelch ([id number]) [id number] Page 3 of 6 -------------------------------------------------------------------------------- Problem List Details Patient Name: Date of Service: Ryan [name], Ryan [name]. 10/16/2022 2:00 PM Medical Record Number: [id number] Patient Account Number: [id number] Date of Birth/Sex: Treating RN: 1966/02/27 (56 y.o. M) Primary Care  Provider: [name] Other Clinician: Referring Provider: Treating Provider/Extender: Inda Merlin in Treatment: 3 Active Problems ICD-10 Encounter Code Description Active Date MDM Diagnosis L89.893 Pressure ulcer of other site, stage 3 09/23/2022 No Yes E11.622 Type 2 diabetes mellitus with other skin ulcer 09/23/2022 No Yes Z89.511 Acquired absence of right leg below knee 09/23/2022 No Yes Inactive Problems Resolved Problems Electronic Signature([name]) Signed: 10/16/2022 2:42:28 PM By: Duanne Guess MD FACS Entered By: Duanne Guess on 10/16/2022 14:42:28 -------------------------------------------------------------------------------- Progress Note Details Patient Name: Date of Service: Ryan [name], Ryan [name]. 10/16/2022 2:00 PM Medical Record Number: [id number] Patient Account Number: [id number] Date of Birth/Sex: Treating RN: 09-22-1966 (56 y.o. 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 The operation was performed in [facility] by an orthopedic surgeon named Dr. Samuel Bouche. He initially did well, but once he began wearing his prosthetic, he developed an ulcer on the tibial surface. The patient reports that he has been back a number of times to his surgeon and to the prosthetist. Some adaptations have been made to the padding in his silicone sleeve, but the patient says he does not feel like there are any additional options available to him in this regard and does not think the surgeon is able to be particularly  helpful. The patient was referred to the wound care center by his primary care provider, Dr. Ardelle Park. The patient has been applying topical antibiotic ointment and some Hydrofera Blue with a foam border dressing that he had leftover from when he had his foot infection. On inspection, there is a linear erosion on the pressure surface of his tibia. He does not have a muscle flap covering the end of his bone, only skin. No bone Barkdull, Elton  [name] ([id number]) [id number] Page 4 of 6 is exposed. There is some built-up eschar and slough. No surrounding pressure induced tissue damage. No obvious signs of infection. 10/07/2022: Apparently we had incorrect demographic information for the patient and the vascular surgery clinic has tried to call him 3 times but have not been able to reach him. His wound care supplies were also sent to an old address. He has accumulated callus around the wound which is a little bit smaller this week. There is a little slough on the surface, but no concern for infection. 10/16/2022: His wound is healed. He has made no effort to reach out to vascular surgery regarding amputation revision; he says that he really does not want to undergo another operation. He has also been provided with contact information for several other prosthetic providers, but has not communicated with any of them either. Patient History Information obtained from Patient. Family History Cancer - Siblings, Heart Disease - Mother, Hypertension - Mother, Kidney Disease - Mother, Lung Disease - Mother, Seizures - Maternal Grandparents, Stroke - Mother, No family history of Diabetes, Hereditary Spherocytosis, Thyroid Problems, Tuberculosis. Social History Current every day smoker - 1 pack a day, Marital Status - Single, Alcohol Use - Never, Drug Use - No History, Caffeine Use - Moderate. Medical History Respiratory Patient has history of Sleep Apnea Endocrine Patient has history of  Type II Diabetes Neurologic Patient has history of Neuropathy Hospitalization/Surgery History - right leg amputation. - lumbar fusion. - stomach surgery. - posterior fusion occiput-c2. - open heart surgery. Medical A Surgical History Notes nd Gastrointestinal GERD Musculoskeletal arthritis Objective Constitutional Slightly tachycardic, asymptomatic.Marland Kitchen No acute distress. Vitals Time Taken: 2:00 PM, Height: 73 in, Weight: 200 lbs, BMI: 26.4, Temperature: 97.9 F, Pulse: 103 bpm, Respiratory Rate: 18 breaths/min, Blood Pressure: 110/77 mmHg. Respiratory Normal work of breathing on room air. General Notes: 10/16/2022: His wound is healed. Integumentary (Hair, Skin) Wound #1 status is Healed - Epithelialized. Original cause of wound was Pressure Injury. The date acquired was: 05/10/2021. The wound has been in treatment 3 weeks. The wound is located on the Right Amputation Site - Below Knee. The wound measures 0cm length x 0cm width x 0cm depth; 0cm^2 area and 0cm^3 volume. There is Fat Layer (Subcutaneous Tissue) exposed. There is no tunneling or undermining noted. There is a medium amount of serosanguineous drainage noted. The wound margin is distinct with the outline attached to the wound base. There is small (1-33%) pink granulation within the wound bed. There is a large (67-100%) amount of necrotic tissue within the wound bed including Eschar. The periwound skin appearance exhibited: Callus, Dry/Scaly, Erythema. The surrounding wound skin color is noted with erythema. Periwound temperature was noted as No Abnormality. Assessment Active Problems ICD-10 Pressure ulcer of other site, stage 3 Type 2 diabetes mellitus with other skin ulcer Acquired absence of right leg below knee Plan PATRICE, MOATES ([id number]) [id number] Page 5 of 6 Discharge From [facility] Services: Discharge from Wound Care Center - Congratulations!! Please reach out to the company listed below  to have modifications made to your prosthetic. [employer]- [phone] [address], [place], [place] [address] Bathing/ Shower/ Hygiene: May shower and wash wound with soap and water. 10/16/2022: His wound is healed. While he is in the process of looking at potential modifications to his prosthesis, we will construct a type of foam doughnut using a heel cup. Hopefully this will be adequate to keep him from reopening his wound. We  will discharge him from the wound care center and he may follow- up as needed. Electronic Signature([name]) Signed: 10/16/2022 2:46:09 PM By: Duanne Guessannon, Kristelle Cavallaro MD FACS Entered By: Duanne Guessannon, Leetta Hendriks on 10/16/2022 14:46:09 -------------------------------------------------------------------------------- HxROS Details Patient Name: Date of Service: Ryan [name], Ryan [name]. 10/16/2022 2:00 PM Medical Record Number: [id number] Patient Account Number: [id number] Date of Birth/Sex: Treating RN: 17-Nov-1965 (56 y.o. M) Primary Care Provider: Clelia CroftGeorge, Robert Other Clinician: Referring Provider: Treating Provider/Extender: Inda Merlinannon, Skilar Marcou George, Robert Weeks in Treatment: 3 Information Obtained From Patient Respiratory Medical History: Positive for: Sleep Apnea Gastrointestinal Medical History: Past Medical History Notes: GERD Endocrine Medical History: Positive for: Type II Diabetes Time with diabetes: since 2002 Treated with: Insulin, Oral agents Blood sugar tested every day: No Musculoskeletal Medical History: Past Medical History Notes: arthritis Neurologic Medical History: Positive for: Neuropathy Immunizations Pneumococcal Vaccine: Received Pneumococcal Vaccination: No Implantable Devices None Hospitalization / Surgery History Blanchard KelchDALLAS, Ryan [name] ([id number]) [id number] Page 6 of 6 Type of Hospitalization/Surgery right leg amputation lumbar fusion stomach surgery posterior fusion occiput-c2 open heart  surgery Family and Social History Cancer: Yes - Siblings; Diabetes: No; Heart Disease: Yes - Mother; Hereditary Spherocytosis: No; Hypertension: Yes - Mother; Kidney Disease: Yes - Mother; Lung Disease: Yes - Mother; Seizures: Yes - Maternal Grandparents; Stroke: Yes - Mother; Thyroid Problems: No; Tuberculosis: No; Current every day smoker - 1 pack a day; Marital Status - Single; Alcohol Use: Never; Drug Use: No History; Caffeine Use: Moderate; Financial Concerns: No; Food, Clothing or Shelter Needs: No; Support System Lacking: No; Transportation Concerns: No Electronic Signature([name]) Signed: 10/16/2022 2:59:42 PM By: Duanne Guessannon, Muaz Shorey MD FACS Entered By: Duanne Guessannon, Anjelica Gorniak on 10/16/2022 14:44:31 -------------------------------------------------------------------------------- SuperBill Details Patient Name: Date of Service: Ryan [name], Ryan [name]. 10/16/2022 Medical Record Number: [id number] Patient Account Number: [id number] Date of Birth/Sex: Treating RN: 17-Nov-1965 (56 y.o. Valma CavaM) Zochol, Jamie Primary Care Provider: Clelia CroftGeorge, Robert Other Clinician: Referring Provider: Treating Provider/Extender: Inda Merlinannon, Tyliyah Mcmeekin George, Robert Weeks in Treatment: 3 Diagnosis Coding ICD-10 Codes Code Description [phone] Pressure ulcer of other site, stage 3 E11.622 Type 2 diabetes mellitus with other skin ulcer Z89.511 Acquired absence of right leg below knee Facility Procedures : CPT4 Code: [id number] Description: 99213 - WOUND CARE VISIT-LEV 3 EST PT Modifier: 25 Quantity: 1 Physician Procedures : CPT4 Code Description Modifier [id number] 99213 - WC PHYS LEVEL 3 - EST PT ICD-10 Diagnosis Description L89.893 Pressure ulcer of other site, stage 3 E11.622 Type 2 diabetes mellitus with other skin ulcer Z89.511 Acquired absence of right leg below knee Quantity: 1 Electronic Signature([name]) Signed: 10/16/2022 2:46:23 PM By: Duanne Guessannon, Adair Lemar MD FACS Entered By: Duanne Guessannon, Tearah Saulsbury on 10/16/2022 14:46:23

## 2022-10-17 NOTE — Progress Notes (Signed)
AFTON, MIKELSON ([id number]) [id number] Page 1 of 7 Visit Report for 10/16/2022 Arrival Information Details Patient Name: Date of Service: Ryan [name], Ryan [name] TRICK E. 10/16/2022 2:00 PM Medical Record Number: [id number] Patient Account Number: [id number] Date of Birth/Sex: Treating RN: [birth year] (56 y.o. Ryan [name] Primary Care Amylynn Fano: [name] Other Clinician: Referring Torina Ey: Treating Vinessa Macconnell/Extender: Inda Merlin in Treatment: 3 Visit Information History Since Last Visit Added or deleted any medications: No Patient Arrived: Gilmer Mor Any new allergies or adverse reactions: No Arrival Time: 14:08 Had a fall or experienced change in No Accompanied By: self activities of daily living that may affect Transfer Assistance: None risk of falls: Patient Identification Verified: Yes Signs or symptoms of abuse/neglect since last visito No Secondary Verification Process Completed: Yes Hospitalized since last visit: No Patient Requires Transmission-Based Precautions: No Implantable device outside of the clinic excluding No Patient Has Alerts: No cellular tissue based products placed in the center since last visit: Has Dressing in Place as Prescribed: Yes Pain Present Now: Yes Electronic Signature([name]) Signed: 10/16/2022 4:16:33 PM By: Tommie Ard RN Entered By: Tommie Ard on 10/16/2022 14:09:11 -------------------------------------------------------------------------------- Clinic Level of Care Assessment Details Patient Name: Date of Service: Ryan LLA S, PA TRICK E. 10/16/2022 2:00 PM Medical Record Number: [id number] Patient Account Number: [id number] Date of Birth/Sex: Treating RN: 1966/06/28 (56 y.o. Ryan [name] Primary Care Taiquan Campanaro: [name] Other Clinician: Referring Jency Schnieders: Treating Timmia Cogburn/Extender: Inda Merlin in Treatment: 3 Clinic Level of Care Assessment Items TOOL 4  Quantity Score X- 1 0 Use when only an EandM is performed on FOLLOW-UP visit ASSESSMENTS - Nursing Assessment / Reassessment X- 1 10 Reassessment of Co-morbidities (includes updates in patient status) X- 1 5 Reassessment of Adherence to Treatment Plan ASSESSMENTS - Wound and Skin A ssessment / Reassessment X - Simple Wound Assessment / Reassessment - one wound 1 5 []  - 0 Complex Wound Assessment / Reassessment - multiple wounds []  - 0 Dermatologic / Skin Assessment (not related to wound area) ASSESSMENTS - Focused Assessment X- 1 5 Circumferential Edema Measurements - multi extremities []  - 0 Nutritional Assessment / Counseling / Intervention GRACIANO, BATSON ( ) [id number] Page 2 of 7 []  - 0 Lower Extremity Assessment (monofilament, tuning fork, pulses) []  - 0 Peripheral Arterial Disease Assessment (using hand held doppler) ASSESSMENTS - Ostomy and/or Continence Assessment and Care []  - 0 Incontinence Assessment and Management []  - 0 Ostomy Care Assessment and Management (repouching, etc.) PROCESS - Coordination of Care X - Simple Patient / Family Education for ongoing care 1 15 []  - 0 Complex (extensive) Patient / Family Education for ongoing care X- 1 10 Staff obtains Blanchard Kelch, Records, T Results / Process Orders est X- 1 10 Staff telephones HHA, Nursing Homes / Clarify orders / etc []  - 0 Routine Transfer to another Facility (non-emergent condition) []  - 0 Routine Hospital Admission (non-emergent condition) []  - 0 New Admissions / [id number] / Ordering NPWT Apligraf, etc. , []  - 0 Emergency Hospital Admission (emergent condition) []  - 0 Simple Discharge Coordination []  - 0 Complex (extensive) Discharge Coordination PROCESS - Special Needs []  - 0 Pediatric / Minor Patient Management []  - 0 Isolation Patient Management []  - 0 Hearing / Language / Visual special needs []  - 0 Assessment of Community  assistance (transportation, D/C planning, etc.) []  - 0 Additional assistance / Altered mentation []  - 0 Support Surface([name]) Assessment (bed, cushion, seat, etc.) INTERVENTIONS - Wound Cleansing / Measurement X -  Simple Wound Cleansing - one wound 1 5 []  - 0 Complex Wound Cleansing - multiple wounds X- 1 5 Wound Imaging (photographs - any number of wounds) []  - 0 Wound Tracing (instead of photographs) X- 1 5 Simple Wound Measurement - one wound []  - 0 Complex Wound Measurement - multiple wounds INTERVENTIONS - Wound Dressings X - Small Wound Dressing one or multiple wounds 1 10 []  - 0 Medium Wound Dressing one or multiple wounds []  - 0 Large Wound Dressing one or multiple wounds []  - 0 Application of Medications - topical []  - 0 Application of Medications - injection INTERVENTIONS - Miscellaneous []  - 0 External ear exam []  - 0 Specimen Collection (cultures, biopsies, blood, body fluids, etc.) []  - 0 Specimen([name]) / Culture([name]) sent or taken to Lab for analysis []  - 0 Patient Transfer (multiple staff / / Similar devices) []  - 0 Simple Staple / Suture removal (25 or less) []  - 0 Complex Staple / Suture removal (26 or more) []  - 0 Hypo / Hyperglycemic Management (close monitor of Blood Glucose) AVYUKT, CIMO E ( ) [id number] Page 3 of 7 []  - 0 Ankle / Brachial Index (ABI) - do not check if billed separately X- 1 5 Vital Signs Has the patient been seen at the hospital within the last three years: Yes Total Score: 90 Level Of Care: New/Established - Level 3 Electronic Signature([name]) Signed: 10/16/2022 4:16:33 PM By: RN Entered By: on 10/16/2022 14:42:26 -------------------------------------------------------------------------------- Encounter Discharge Information Details Patient Name: Date of Service: Ryan LLA S, PA TRICK E. 10/16/2022 2:00 PM Medical Record Number: Patient Account  Number: Date of Birth/Sex: Treating RN: [birth year] (56 y.o. Primary Care Evia Goldsmith: Other Clinician: Referring Chaselynn Kepple: Treating Abrar Bilton/Extender: Desmond Dike in Treatment: 3 Encounter Discharge Information Items Discharge Condition: Stable Ambulatory Status: Cane Discharge Destination: Home Transportation: Private Auto Accompanied By: self Schedule Follow-up Appointment: Yes Clinical Summary of Care: Electronic Signature([name]) Signed: 10/16/2022 4:16:33 PM By: 04-02-1999 RN Entered By: on 10/16/2022 14:43:09 -------------------------------------------------------------------------------- Lower Extremity Assessment Details Patient Name: Date of Service: Ryan LLA S, PA TRICK E. 10/16/2022 2:00 PM Medical Record Number: Tommie Ard Patient Account Number: 14/05/2022 Date of Birth/Sex: Treating RN: [birth year] (56 y.o. [id number] Primary Care Kenyan Karnes: 04/01/1966 Other Clinician: Referring Hayly Litsey: Treating Jermane Brayboy/Extender: 59 in Treatment: 3 Edema Assessment Assessed: Ryan [name]: No] [name]: No] [Left: Edema] [Right: :] Calf Left: Right: Point of Measurement: From Medial Instep 29 cm Vascular Assessment Pulses: Posterior Tibial Doppler Audible: [Right:Yes] Solorzano, Rasean E (Inda Merlin) [Right:[id number] Page 4 of 7] Electronic Signature([name]) Signed: 10/16/2022 4:16:33 PM By: Tommie Ard RN Entered By: Tommie Ard on 10/16/2022 14:11:18 -------------------------------------------------------------------------------- Multi Wound Chart Details Patient Name: Date of Service: Ryan LLA S, PA TRICK E. 10/16/2022 2:00 PM Medical Record Number: [id number] Patient Account Number: [id number] Date of Birth/Sex: Treating RN: [birth year] (56 y.o. M) Primary Care Rubi Tooley: Ryan [name] Other Clinician: Referring Herndon Grill: Treating  English Craighead/Extender: [name] in Treatment: 3 Vital Signs Height(in): 73 Pulse(bpm): 103 Weight(lbs): 200 Blood Pressure(mmHg): 110/77 Body Mass Index(BMI): 26.4 Temperature(F): 97.9 Respiratory Rate(breaths/min): 18 [1:Photos: No Photos Right Amputation Site - Below Knee N/A Wound Location: Pressure Injury Wounding Event: Diabetic Wound/Ulcer of the Lower Primary Etiology: Extremity Pressure Ulcer Secondary Etiology: Sleep Apnea, Type II Diabetes, Comorbid History: Neuropathy  05/10/2021 Date Acquired: 3 Weeks of Treatment: Healed - Epithelialized Wound Status: No Wound Recurrence: 0x0x0 Measurements  L x W x D (cm) 0 A (cm) : rea 0 Volume (cm) : 100.00% % Reduction in A rea: 100.00% % Reduction in Volume: Grade 1  Classification: Medium Exudate A mount: Serosanguineous Exudate Type: red, brown Exudate Color: Distinct, outline attached Wound Margin: Small (1-33%) Granulation A mount: Pink Granulation Quality: Large (67-100%) Necrotic A mount: Eschar Necrotic  Tissue: Fat Layer (Subcutaneous Tissue): Yes N/A Exposed Structures: Fascia: No Tendon: No Muscle: No Joint: No Bone: No Large (67-100%) Epithelialization: Callus: Yes Periwound Skin Texture: Dry/Scaly: Yes Periwound Skin Moisture: Erythema: Yes  Periwound Skin Color: No Abnormality Temperature:] [N/A:N/A N/A N/A N/A N/A N/A N/A N/A N/A N/A N/A N/A N/A N/A N/A N/A N/A N/A N/A N/A N/A N/A N/A N/A N/A N/A N/A N/A] Treatment Notes Electronic Signature([name]) Signed: 10/16/2022 2:42:34 PM By: Duanne Guess MD FACS Entered By: Duanne Guess on 10/16/2022 14:42:33 Blanchard Kelch ([id number]) [id number] Page 5 of 7 -------------------------------------------------------------------------------- Multi-Disciplinary Care Plan Details Patient Name: Date of Service: Ryan LLA S, PA TRICK E. 10/16/2022 2:00 PM Medical Record Number: [id number] Patient Account Number: [id number] Date of  Birth/Sex: Treating RN: 07-25-1966 (56 y.o. Ryan [name] Primary Care Shiquan Mathieu: [name] Other Clinician: Referring Haruna Rohlfs: Treating Doninique Lwin/Extender: Inda Merlin in Treatment: 3 Active Inactive Electronic Signature([name]) Signed: 10/16/2022 4:16:33 PM By: Tommie Ard RN Entered By: Tommie Ard on 10/16/2022 15:35:20 -------------------------------------------------------------------------------- Pain Assessment Details Patient Name: Date of Service: Ryan LLA S, PA TRICK E. 10/16/2022 2:00 PM Medical Record Number: [id number] Patient Account Number: [id number] Date of Birth/Sex: Treating RN: 1966/03/20 (56 y.o. Ryan [name] Primary Care Kynsley Whitehouse: [name] Other Clinician: Referring Cynara Tatham: Treating Masha Orbach/Extender: Inda Merlin in Treatment: 3 Active Problems Location of Pain Severity and Description of Pain Patient Has Paino Yes Site Locations Rate the pain. Current Pain Level: 7 Character of Pain Describe the Pain: Dull Pain Management and Medication Current Pain Management: Electronic Signature([name]) Signed: 10/16/2022 4:16:33 PM By: Tommie Ard RN Entered By: Tommie Ard on 10/16/2022 14:10:00 Blanchard Kelch ([id number]) [id number] Page 6 of 7 -------------------------------------------------------------------------------- Patient/Caregiver Education Details Patient Name: Date of Service: Ryan LLA S, PA TRICK E. 12/7/2023andnbsp2:00 PM Medical Record Number: [id number] Patient Account Number: [id number] Date of Birth/Gender: Treating RN: Dec 04, 1965 (56 y.o. Ryan [name] Primary Care Physician: [name] Other Clinician: Referring Physician: Treating Physician/Extender: Inda Merlin in Treatment: 3 Education Assessment Education Provided To: Patient Education Topics Provided Electronic Signature([name]) Signed: 10/16/2022 4:16:33 PM By:  Tommie Ard RN Entered By: Tommie Ard on 10/16/2022 15:35:34 -------------------------------------------------------------------------------- Wound Assessment Details Patient Name: Date of Service: Ryan LLA S, PA TRICK E. 10/16/2022 2:00 PM Medical Record Number: [id number] Patient Account Number: [id number] Date of Birth/Sex: Treating RN: 1966/07/20 (56 y.o. 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 Secondary Etiology: Pressure Ulcer Wounding Event: Pressure Injury Wound Status: Healed - Epithelialized Date Acquired: 05/10/2021 Comorbid History: Sleep Apnea, Type II Diabetes, Neuropathy Weeks Of Treatment: 3 Clustered Wound: No Wound Measurements Length: (cm) Width: (cm) Depth: (cm) Area: (cm) Volume: (cm) 0 % Reduction in Area: 100% 0 % Reduction in Volume: 100% 0 Epithelialization: Large (67-100%) 0 Tunneling: No 0 Undermining: No Wound Description Classification: Grade 1 Wound Margin: Distinct, outline attached Exudate Amount: Medium Exudate Type: Serosanguineous Exudate Color: red, brown  Foul Odor After Cleansing: No Slough/Fibrino Yes Wound Bed Granulation Amount: Small (1-33%) Exposed Structure Granulation Quality: Pink Fascia Exposed: No Necrotic Amount: Large (67-100%) Fat Layer (Subcutaneous Tissue) Exposed: Yes Necrotic Quality: Eschar Tendon Exposed: No Desmond DikeDALLAS, Ashely E ([id number]) [id number] Page 7 of 7 Muscle Exposed: No Joint Exposed: No Bone Exposed: No Periwound Skin Texture Texture Color No Abnormalities Noted: No No Abnormalities Noted: No Callus: Yes Erythema: Yes Moisture Temperature / Pain No Abnormalities Noted: No Temperature: No  Abnormality Dry / Scaly: Yes Treatment Notes Wound #1 (Amputation Site - Below Knee) Wound Laterality: Right Cleanser Peri-Wound Care Topical Primary Dressing Secondary Dressing Secured With Compression Wrap Compression Stockings Add-Ons Electronic Signature([name]) Signed: 10/16/2022 4:16:33 PM By: Tommie ArdZochol, Jamie RN Entered By: Tommie ArdZochol, Jamie on 10/16/2022 14:32:35 -------------------------------------------------------------------------------- Vitals Details Patient Name: Date of Service: Ryan Verdis PrimeLLA S, PA TRICK E. 10/16/2022 2:00 PM Medical Record Number: [id number] Patient Account Number: [id number] Date of Birth/Sex: Treating RN: 03-31-1966 (56 y.o. Ryan CavaM) Zochol, Jamie Primary Care Isael Stille: Clelia CroftGeorge, Robert Other Clinician: Referring Eryc Bodey: Treating Brystol Wasilewski/Extender: Inda Merlinannon, Jennifer George, Robert Weeks in Treatment: 3 Vital Signs Time Taken: 14:00 Temperature (F): 97.9 Height (in): 73 Pulse (bpm): 103 Weight (lbs): 200 Respiratory Rate (breaths/min): 18 Body Mass Index (BMI): 26.4 Blood Pressure (mmHg): 110/77 Reference Range: 80 - 120 mg / dl Electronic Signature([name]) Signed: 10/16/2022 4:16:33 PM By: Tommie ArdZochol, Jamie RN Entered By: Tommie ArdZochol, Jamie on 10/16/2022 14:09:49

## 2022-10-23 ENCOUNTER — Encounter (HOSPITAL_BASED_OUTPATIENT_CLINIC_OR_DEPARTMENT_OTHER): Payer: Medicare HMO | Admitting: Internal Medicine

## 2022-11-13 ENCOUNTER — Encounter (HOSPITAL_BASED_OUTPATIENT_CLINIC_OR_DEPARTMENT_OTHER): Payer: Medicare HMO | Admitting: General Surgery

## 2022-11-14 ENCOUNTER — Encounter (HOSPITAL_BASED_OUTPATIENT_CLINIC_OR_DEPARTMENT_OTHER): Payer: 59 | Attending: General Surgery | Admitting: General Surgery

## 2022-11-14 DIAGNOSIS — L97522 Non-pressure chronic ulcer of other part of left foot with fat layer exposed: Secondary | ICD-10-CM | POA: Diagnosis not present

## 2022-11-14 DIAGNOSIS — Z89511 Acquired absence of right leg below knee: Secondary | ICD-10-CM | POA: Diagnosis not present

## 2022-11-14 DIAGNOSIS — F17210 Nicotine dependence, cigarettes, uncomplicated: Secondary | ICD-10-CM | POA: Insufficient documentation

## 2022-11-14 DIAGNOSIS — E1140 Type 2 diabetes mellitus with diabetic neuropathy, unspecified: Secondary | ICD-10-CM | POA: Diagnosis not present

## 2022-11-14 DIAGNOSIS — E11621 Type 2 diabetes mellitus with foot ulcer: Secondary | ICD-10-CM | POA: Insufficient documentation

## 2022-11-14 DIAGNOSIS — G4730 Sleep apnea, unspecified: Secondary | ICD-10-CM | POA: Diagnosis not present

## 2022-11-14 DIAGNOSIS — K219 Gastro-esophageal reflux disease without esophagitis: Secondary | ICD-10-CM | POA: Diagnosis not present

## 2022-11-14 DIAGNOSIS — I10 Essential (primary) hypertension: Secondary | ICD-10-CM | POA: Insufficient documentation

## 2022-11-14 NOTE — Progress Notes (Signed)
Ryan [name], GIBBONS ([id number]) [id number] Page 1 of 9 Visit Report for 11/14/2022 Chief Complaint Document Details Patient Name: Date of Service: Ryan [name], [place] TRICK E. 11/14/2022 10:15 A M Medical Record Number: [id number] Patient Account Number: [id number] Date of Birth/Sex: Treating RN: [birth year] (57 y.o. M) Primary Care Provider: [name] Other Clinician: Referring Provider: Treating Provider/Extender: Inda Merlin in Treatment: 7 Information Obtained from: Patient Chief Complaint Patient is at the clinic for treatment of an open pressure ulcer 11/14/2022: returns with 2 DFU on left foot Electronic Signature([name]) Signed: 11/14/2022 11:53:11 AM By: Duanne Guess MD FACS Entered By: Duanne Guess on 11/14/2022 11:53:10 -------------------------------------------------------------------------------- Debridement Details Patient Name: Date of Service: Ryan LLA S, PA TRICK E. 11/14/2022 10:15 A M Medical Record Number: [id number] Patient Account Number: [id number] Date of Birth/Sex: Treating RN: 1966/03/11 (56 y.o. Dianna Limbo Primary Care Provider: [name] Other Clinician: Referring Provider: Treating Provider/Extender: Inda Merlin in Treatment: 7 Debridement Performed for Assessment: Wound #2 Left,Proximal,Lateral Foot Performed By: Physician Duanne Guess, MD Debridement Type: Debridement Severity of Tissue Pre Debridement: Fat layer exposed Level of Consciousness (Pre-procedure): Awake and Alert Pre-procedure Verification/Time Out Yes - 11:00 Taken: Start Time: 11:00 Pain Control: Lidocaine 5% topical ointment T Area Debrided (L x W): otal 0.6 (cm) x 0.6 (cm) = 0.36 (cm) Tissue and other material debrided: Non-Viable, Callus, Slough, Subcutaneous, Slough, Other: Skin Level: Skin/Subcutaneous Tissue Debridement Description: Excisional Instrument: Curette Bleeding:  Minimum Hemostasis Achieved: Pressure End Time: 11:01 Procedural Pain: 0 Post Procedural Pain: 0 Response to Treatment: Procedure was tolerated well Level of Consciousness (Post- Awake and Alert procedure): Post Debridement Measurements of Total Wound Length: (cm) 0.6 Width: (cm) 0.6 Depth: (cm) 0.3 Volume: (cm) 0.085 Kichline, Katrina E ([id number]) [id number] Page 2 of 9 Character of Wound/Ulcer Post Debridement: Improved Severity of Tissue Post Debridement: Fat layer exposed Post Procedure Diagnosis Same as Pre-procedure Notes Scribed for Dr. Lady Gary by J.Scotton Electronic Signature([name]) Signed: 11/14/2022 12:49:58 PM By: Karie Schwalbe RN Signed: 11/14/2022 1:30:14 PM By: Duanne Guess MD FACS Entered By: Karie Schwalbe on 11/14/2022 11:12:08 -------------------------------------------------------------------------------- Debridement Details Patient Name: Date of Service: Ryan Verdis Prime S, PA TRICK E. 11/14/2022 10:15 A M Medical Record Number: [id number] Patient Account Number: [id number] Date of Birth/Sex: Treating RN: 09/01/1966 (57 y.o. Dianna Limbo Primary Care Provider: [name] Other Clinician: Referring Provider: Treating Provider/Extender: Inda Merlin in Treatment: 7 Debridement Performed for Assessment: Wound #3 Left,Distal,Lateral Foot Performed By: Physician Duanne Guess, MD Debridement Type: Debridement Severity of Tissue Pre Debridement: Fat layer exposed Level of Consciousness (Pre-procedure): Awake and Alert Pre-procedure Verification/Time Out Yes - 11:00 Taken: Start Time: 11:00 Pain Control: Lidocaine 5% topical ointment T Area Debrided (L x W): otal 0.4 (cm) x 1 (cm) = 0.4 (cm) Tissue and other material debrided: Non-Viable, Slough, Subcutaneous, Slough, Other: Skin Level: Skin/Subcutaneous Tissue Debridement Description: Excisional Instrument: Curette Bleeding: Minimum Hemostasis  Achieved: Pressure End Time: 11:01 Procedural Pain: 0 Post Procedural Pain: 0 Response to Treatment: Procedure was tolerated well Level of Consciousness (Post- Awake and Alert procedure): Post Debridement Measurements of Total Wound Length: (cm) 0.4 Width: (cm) 1 Depth: (cm) 0.3 Volume: (cm) 0.094 Character of Wound/Ulcer Post Debridement: Improved Severity of Tissue Post Debridement: Fat layer exposed Post Procedure Diagnosis Same as Pre-procedure Notes Scribed for Dr. Lady Gary by J.Scotton Electronic Signature([name]) Signed: 11/14/2022 12:49:58 PM By: Karie Schwalbe RN Signed: 11/14/2022 1:30:14 PM By: Duanne Guess MD FACS Entered By: Karie Schwalbe on  11/14/2022 11:13:12 [name], Ryan ([id number]) [id number] Page 3 of 9 -------------------------------------------------------------------------------- HPI Details Patient Name: Date of Service: Ryan LLA S, PA TRICK E. 11/14/2022 10:15 A M Medical Record Number: [id number] Patient Account Number: [id number] Date of Birth/Sex: Treating RN: [birth year] (57 y.o. 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 The operation was performed in [facility] by an orthopedic surgeon named Dr. Linton Rump. He initially did well, but once he began wearing his prosthetic, he developed an ulcer on the tibial surface. The patient reports that he has been back a number of times to his surgeon and to the prosthetist. Some adaptations have been made to the padding in his silicone sleeve, but the patient says he does not feel like there are any additional options available to him in this regard and does not  think the surgeon is able to be particularly helpful. The patient was referred to the wound care center by his primary care provider, Dr. Jannette Fogo. The patient has been applying topical antibiotic ointment and some Hydrofera Blue with a foam border dressing that he had leftover from when he had his foot infection. On inspection, there is a linear erosion on the pressure surface of his tibia. He does not have a muscle flap covering the end of his bone, only skin. No bone is exposed. There is some built-up eschar and slough. No surrounding pressure induced tissue damage. No obvious signs of infection. 10/07/2022: Apparently we had incorrect demographic information for the patient and the vascular surgery clinic has tried to call him 3 times but have not been able to reach him. His wound care supplies were also sent to an old address. He has accumulated callus around the wound which is a little bit smaller this week. There is a little slough on the surface, but no concern for infection. 10/16/2022: His wound is healed. He has made no effort to reach out to vascular surgery regarding amputation revision; he says that he really does not want to undergo another operation. He has also been provided with contact information for several other prosthetic providers, but has not communicated with any of them either. 11/14/2022: Since his last visit, he apparently stepped on an exposed bolt in his stocking feet and now has 2 ulcers on the lateral aspect of the plantar surface of his left foot. Both wounds are clean but the fat is exposed and there is some undermining from the skin. Electronic Signature([name]) Signed: 11/14/2022 11:54:27 AM By: Fredirick Maudlin MD FACS Entered By: Fredirick Maudlin on 11/14/2022 11:54:26 -------------------------------------------------------------------------------- Physical Exam Details Patient Name: Date of Service: Ryan LLA S, PA TRICK E. 11/14/2022 10:15 A M Medical Record Number:  [id number] Patient Account Number: [id number] Date of Birth/Sex: Treating RN: 23-Oct-1966 (57 y.o. M) Primary Care Provider: Irven Shelling Other Clinician: Referring Provider: Treating Provider/Extender: Donley Redder in Treatment: 7 Constitutional Hypertensive, asymptomatic. . . . no acute distress. Respiratory Normal work of breathing on room air. Notes 11/14/2022: He now has 2 ulcers on the lateral aspect of the plantar surface of his left foot. Both wounds are clean but the fat is exposed and there is some undermining from the skin. Electronic Signature([name]) Signed: 11/14/2022 11:55:47 AM By: Fredirick Maudlin MD  FACS Entered By: Duanne Guessannon, Angala Hilgers on 11/14/2022 11:55:47 Blanchard KelchALLAS, Yahshua E ([id number]) [id number] Page 4 of 9 -------------------------------------------------------------------------------- Physician Orders Details Patient Name: Date of Service: Ryan LLA S, PA TRICK E. 11/14/2022 10:15 A M Medical Record Number: [id number] Patient Account Number: [id number] Date of Birth/Sex: Treating RN: 1966-05-10 (57 y.o. Dianna LimboM) Scotton, Joanne Primary Care Provider: Clelia CroftGeorge, Robert Other Clinician: Referring Provider: Treating Provider/Extender: Inda Merlinannon, Auri Jahnke George, Robert Weeks in Treatment: 7 Verbal / Phone Orders: No Diagnosis Coding ICD-10 Coding Code Description [id number] Non-pressure chronic ulcer of other part of left foot with fat layer exposed E11.621 Type 2 diabetes mellitus with foot ulcer Follow-up Appointments ppointment in 1 week. - Dr. Lady Garyannon Room 3 Return A Anesthetic (In clinic) Topical Lidocaine 5% applied to wound bed - Used in Clinic Off-Loading Other: - Elevate feet throughout the day Wound Treatment Wound #2 - Foot Wound Laterality: Left, Lateral, Proximal Cleanser: Soap and Water 1 x Per Day/30 Days Discharge Instructions: May shower and wash wound with dial antibacterial soap and water prior to dressing  change. Cleanser: Wound Cleanser 1 x Per Day/30 Days Discharge Instructions: Cleanse the wound with wound cleanser prior to applying a clean dressing using gauze sponges, not tissue or cotton balls. Prim Dressing: Sorbalgon AG Dressing 2x2 (in/in) 1 x Per Day/30 Days ary Discharge Instructions: Apply to wound bed as instructed Secondary Dressing: Optifoam Non-Adhesive Dressing, 4x4 in 1 x Per Day/30 Days Discharge Instructions: Apply over primary dressing as directed. Secondary Dressing: Woven Gauze Sponges 2x2 in 1 x Per Day/30 Days Discharge Instructions: Apply over primary dressing as directed. Secured With: 58M Medipore Scientist, research (life sciences)oft Cloth Surgical T 2x10 (in/yd) 1 x Per Day/30 Days ape Discharge Instructions: Secure with tape as directed. Wound #3 - Foot Wound Laterality: Left, Lateral, Distal Cleanser: Soap and Water 1 x Per Day/30 Days Discharge Instructions: May shower and wash wound with dial antibacterial soap and water prior to dressing change. Cleanser: Wound Cleanser 1 x Per Day/30 Days Discharge Instructions: Cleanse the wound with wound cleanser prior to applying a clean dressing using gauze sponges, not tissue or cotton balls. Prim Dressing: Sorbalgon AG Dressing 2x2 (in/in) 1 x Per Day/30 Days ary Discharge Instructions: Apply to wound bed as instructed Secondary Dressing: Optifoam Non-Adhesive Dressing, 4x4 in 1 x Per Day/30 Days Discharge Instructions: Apply over primary dressing as directed. Secondary Dressing: Woven Gauze Sponges 2x2 in 1 x Per Day/30 Days Discharge Instructions: Apply over primary dressing as directed. Secured With: 58M Medipore Scientist, research (life sciences)oft Cloth Surgical T 2x10 (in/yd) 1 x Per Day/30 Days ape Discharge Instructions: Secure with tape as directed. Electronic Signature([name]) Blanchard KelchDALLAS, Laszlo E ([id number]) [id number] Page 5 of 9 Signed: 11/14/2022 1:30:14 PM By: Duanne Guessannon, Bryndon Cumbie MD FACS Entered By: Duanne Guessannon, Daylynn Stumpp on 11/14/2022  11:56:11 -------------------------------------------------------------------------------- Problem List Details Patient Name: Date of Service: Ryan LLA S, PA TRICK E. 11/14/2022 10:15 A M Medical Record Number: [id number] Patient Account Number: [id number] Date of Birth/Sex: Treating RN: 1966-05-10 (57 y.o. M) Primary Care Provider: Clelia CroftGeorge, Robert Other Clinician: Referring Provider: Treating Provider/Extender: Inda Merlinannon, Kanai Berrios George, Robert Weeks in Treatment: 7 Active Problems ICD-10 Encounter Code Description Active Date MDM Diagnosis [phone] Non-pressure chronic ulcer of other part of left foot with fat layer exposed 11/14/2022 No Yes E11.621 Type 2 diabetes mellitus with foot ulcer 11/14/2022 No Yes Inactive Problems ICD-10 Code Description Active Date Inactive Date L89.893 Pressure ulcer of other site, stage 3 09/23/2022 09/23/2022 Z36.64411.622 Type 2 diabetes mellitus with other skin ulcer 09/23/2022 09/23/2022 Z89.511 Acquired absence of right leg  below knee 09/23/2022 09/23/2022 Resolved Problems Electronic Signature([name]) Signed: 11/14/2022 11:50:59 AM By: Duanne Guess MD FACS Entered By: Duanne Guess on 11/14/2022 11:50:59 -------------------------------------------------------------------------------- Progress Note Details Patient Name: Date of Service: Ryan LLA S, PA TRICK E. 11/14/2022 10:15 A M Medical Record Number: [id number] Patient Account Number: [id number] Date of Birth/Sex: Treating RN: [birth year] (57 y.o. M) Primary Care Provider: [name] Other Clinician: Referring Provider: Treating Provider/Extender: Inda Merlin in Treatment: [address] 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 The operation was performed in [facility] by an orthopedic surgeon named Dr. Samuel Bouche. He initially did well, but once he began wearing his prosthetic, he developed an ulcer on the tibial surface. The patient reports that he has been back a number of times to his surgeon and to the prosthetist. Some adaptations have been made to the padding in his silicone sleeve, but the patient says he does not feel like there are any additional options available to him in this regard and does not think the surgeon is able to be particularly helpful. The patient was referred to the wound care center by his primary care provider, Dr. Ardelle Park. The patient has been applying topical antibiotic ointment and some Hydrofera Blue with a foam border dressing that he had leftover from when he had his foot infection. On inspection, there is a linear erosion on the pressure surface of his tibia. He does not have a muscle flap covering the end of his bone, only skin. No bone is exposed. There is some built-up eschar and slough. No surrounding pressure induced tissue damage. No obvious signs of infection. 10/07/2022: Apparently we had incorrect demographic information for the patient and the vascular surgery clinic has tried to call him 3 times but have not been able to reach him. His wound care supplies were also sent to an old address. He has accumulated callus around the wound which is a little bit smaller this week. There is a little slough on the surface, but no concern for infection. 10/16/2022: His wound is healed. He has made no effort to reach out to vascular surgery regarding amputation revision; he says that he really does not want to undergo another operation. He has also been provided with contact information for several other prosthetic providers,  but has not communicated with any of them either. 11/14/2022: Since his last visit, he apparently stepped on an exposed bolt in his stocking feet and now has 2 ulcers on the lateral aspect of the plantar surface of his left foot. Both wounds are clean but the fat is exposed and there is some undermining from the skin. Patient History Information obtained from Patient. Family History Cancer - Siblings, Heart Disease - Mother, Hypertension - Mother, Kidney Disease - Mother, Lung Disease - Mother, Seizures - Maternal Grandparents, Stroke - Mother, No family history of Diabetes, Hereditary Spherocytosis, Thyroid Problems, Tuberculosis. Social History Current every day smoker - 1 pack a day, Marital Status - Single, Alcohol Use - Never, Drug Use - No History, Caffeine Use - Moderate. Medical History Respiratory Patient has history of Sleep Apnea Endocrine Patient has history of Type II  Diabetes Neurologic Patient has history of Neuropathy Hospitalization/Surgery History - right leg amputation. - lumbar fusion. - stomach surgery. - posterior fusion occiput-c2. - open heart surgery. Medical A Surgical History Notes nd Gastrointestinal GERD Musculoskeletal arthritis Objective Constitutional Hypertensive, asymptomatic. no acute distress. Vitals Time Taken: 10:12 AM, Height: 73 in, Weight: 200 lbs, BMI: 26.4, Temperature: 97.6 F, Pulse: 89 bpm, Respiratory Rate: 20 breaths/min, Blood Pressure: 161/92 mmHg. Respiratory Normal work of breathing on room air. General Notes: 11/14/2022: He now has 2 ulcers on the lateral aspect of the plantar surface of his left foot. Both wounds are clean but the fat is exposed and there is some undermining from the skin. Integumentary (Hair, Skin) Wound #2 status is Open. Original cause of wound was Hematoma. The date acquired was: 10/24/2022. The wound is located on the Left,Proximal,Lateral Foot. The wound measures 0.6cm length x 0.6cm width x 0.3cm depth;  0.283cm^2 area and 0.085cm^3 volume. There is Fat Layer (Subcutaneous Tissue) exposed. There is no tunneling noted, however, there is undermining starting at 10:00 and ending at 1:00 with a maximum distance of 0.5cm. There is a medium amount of serosanguineous drainage noted. There is medium (34-66%) pink granulation within the wound bed. There is a medium (34-66%) amount of necrotic tissue within the wound bed including Adherent Slough. The periwound skin appearance had no abnormalities noted for moisture. The periwound skin appearance had no abnormalities noted for color. The periwound skin appearance exhibited: Scarring. Periwound temperature was noted as No Abnormality. Blanchard KelchDALLAS, Atharv E ([id number]) [id number] Page 7 of 9 Wound #3 status is Open. Original cause of wound was Hematoma. The date acquired was: 10/24/2022. The wound is located on the Left,Distal,Lateral Foot. The wound measures 0.4cm length x 1cm width x 0.3cm depth; 0.314cm^2 area and 0.094cm^3 volume. There is Fat Layer (Subcutaneous Tissue) exposed. There is no tunneling noted, however, there is undermining starting at 9:00 and ending at 12:00 with a maximum distance of 0.3cm. There is a medium amount of serosanguineous drainage noted. There is medium (34-66%) pink granulation within the wound bed. There is a medium (34-66%) amount of necrotic tissue within the wound bed including Adherent Slough. The periwound skin appearance had no abnormalities noted for texture. The periwound skin appearance had no abnormalities noted for moisture. The periwound skin appearance had no abnormalities noted for color. Periwound temperature was noted as No Abnormality. Assessment Active Problems ICD-10 Non-pressure chronic ulcer of other part of left foot with fat layer exposed Type 2 diabetes mellitus with foot ulcer Procedures Wound #2 Pre-procedure diagnosis of Wound #2 is a Diabetic Wound/Ulcer of the Lower  Extremity located on the Left,Proximal,Lateral Foot .Severity of Tissue Pre Debridement is: Fat layer exposed. There was a Excisional Skin/Subcutaneous Tissue Debridement with a total area of 0.36 sq cm performed by Duanne Guessannon, Wonda Goodgame, MD. With the following instrument([name]): Curette to remove Non-Viable tissue/material. Material removed includes Callus, Subcutaneous Tissue, Slough, and Other: Skin after achieving pain control using Lidocaine 5% topical ointment. No specimens were taken. A time out was conducted at 11:00, prior to the start of the procedure. A Minimum amount of bleeding was controlled with Pressure. The procedure was tolerated well with a pain level of 0 throughout and a pain level of 0 following the procedure. Post Debridement Measurements: 0.6cm length x 0.6cm width x 0.3cm depth; 0.085cm^3 volume. Character of Wound/Ulcer Post Debridement is improved. Severity of Tissue Post Debridement is: Fat layer exposed. Post procedure Diagnosis Wound #2: Same as Pre-Procedure General Notes: Scribed for Dr.  Keva Darty by Kellogg. Wound #3 Pre-procedure diagnosis of Wound #3 is a Diabetic Wound/Ulcer of the Lower Extremity located on the Left,Distal,Lateral Foot .Severity of Tissue Pre Debridement is: Fat layer exposed. There was a Excisional Skin/Subcutaneous Tissue Debridement with a total area of 0.4 sq cm performed by Duanne Guess, MD. With the following instrument([name]): Curette to remove Non-Viable tissue/material. Material removed includes Subcutaneous Tissue, Slough, and Other: Skin after achieving pain control using Lidocaine 5% topical ointment. No specimens were taken. A time out was conducted at 11:00, prior to the start of the procedure. A Minimum amount of bleeding was controlled with Pressure. The procedure was tolerated well with a pain level of 0 throughout and a pain level of 0 following the procedure. Post Debridement Measurements: 0.4cm length x 1cm width x 0.3cm depth; 0.094cm^3  volume. Character of Wound/Ulcer Post Debridement is improved. Severity of Tissue Post Debridement is: Fat layer exposed. Post procedure Diagnosis Wound #3: Same as Pre-Procedure General Notes: Scribed for Dr. Lady Gary by J.Scotton. Plan Follow-up Appointments: Return Appointment in 1 week. - Dr. Lady Gary Room 3 Anesthetic: (In clinic) Topical Lidocaine 5% applied to wound bed - Used in Clinic Off-Loading: Other: - Elevate feet throughout the day WOUND #2: - Foot Wound Laterality: Left, Lateral, Proximal Cleanser: Soap and Water 1 x Per Day/30 Days Discharge Instructions: May shower and wash wound with dial antibacterial soap and water prior to dressing change. Cleanser: Wound Cleanser 1 x Per Day/30 Days Discharge Instructions: Cleanse the wound with wound cleanser prior to applying a clean dressing using gauze sponges, not tissue or cotton balls. Prim Dressing: Sorbalgon AG Dressing 2x2 (in/in) 1 x Per Day/30 Days ary Discharge Instructions: Apply to wound bed as instructed Secondary Dressing: Optifoam Non-Adhesive Dressing, 4x4 in 1 x Per Day/30 Days Discharge Instructions: Apply over primary dressing as directed. Secondary Dressing: Woven Gauze Sponges 2x2 in 1 x Per Day/30 Days Discharge Instructions: Apply over primary dressing as directed. Secured With: 61M Medipore Scientist, research (life sciences) Surgical T 2x10 (in/yd) 1 x Per Day/30 Days ape Discharge Instructions: Secure with tape as directed. WOUND #3: - Foot Wound Laterality: Left, Lateral, Distal Cleanser: Soap and Water 1 x Per Day/30 Days Discharge Instructions: May shower and wash wound with dial antibacterial soap and water prior to dressing change. Cleanser: Wound Cleanser 1 x Per Day/30 Days Discharge Instructions: Cleanse the wound with wound cleanser prior to applying a clean dressing using gauze sponges, not tissue or cotton balls. Prim Dressing: Sorbalgon AG Dressing 2x2 (in/in) 1 x Per Day/30 Days ary Discharge Instructions: Apply to  wound bed as instructed Secondary Dressing: Optifoam Non-Adhesive Dressing, 4x4 in 1 x Per Day/30 Days Discharge Instructions: Apply over primary dressing as directed. Secondary Dressing: Woven Gauze Sponges 2x2 in 1 x Per Day/30 Days Discharge Instructions: Apply over primary dressing as directed. Secured With: 61M Medipore Scientist, research (life sciences) Surgical T 2x10 (in/yd) 1 x Per Day/30 Days ape Discharge Instructions: Secure with tape as directed. JAVON, SNEE ([id number]) [id number] Page 8 of 9 11/14/2022: He now has 2 ulcers on the lateral aspect of the plantar surface of his left foot. Both wounds are clean but the fat is exposed and there is some undermining from the skin. I used a curette to debride callus, slough, nonviable subcutaneous tissue, as well as skin to eliminate the undermined areas of the wound. We will use silver alginate and create foam donut to offload the area. Unfortunately, due to his contralateral BKA, he is not going to be a  good candidate for an offloading shoe. Follow-up in 1 week. Electronic Signature([name]) Signed: 11/14/2022 11:57:02 AM By: Fredirick Maudlin MD FACS Entered By: Fredirick Maudlin on 11/14/2022 11:57:02 -------------------------------------------------------------------------------- HxROS Details Patient Name: Date of Service: Ryan LLA S, [place] TRICK E. 11/14/2022 10:15 A M Medical Record Number: [id number] Patient Account Number: [id number] Date of Birth/Sex: Treating RN: 1965-12-31 (57 y.o. M) Primary Care Provider: Irven Shelling Other Clinician: Referring Provider: Treating Provider/Extender: Donley Redder in Treatment: 7 Information Obtained From Patient Respiratory Medical History: Positive for: Sleep Apnea Gastrointestinal Medical History: Past Medical History Notes: GERD Endocrine Medical History: Positive for: Type II Diabetes Time with diabetes: since 2002 Treated with: Insulin, Oral  agents Blood sugar tested every day: No Musculoskeletal Medical History: Past Medical History Notes: arthritis Neurologic Medical History: Positive for: Neuropathy Immunizations Pneumococcal Vaccine: Received Pneumococcal Vaccination: No Implantable Devices None Hospitalization / Surgery History Type of Hospitalization/Surgery right leg amputation lumbar fusion stomach surgery CHARELS, STAMBAUGH ([id number]) [id number] Page 9 of 9 posterior fusion occiput-c2 open heart surgery Family and Social History Cancer: Yes - Siblings; Diabetes: No; Heart Disease: Yes - Mother; Hereditary Spherocytosis: No; Hypertension: Yes - Mother; Kidney Disease: Yes - Mother; Lung Disease: Yes - Mother; Seizures: Yes - Maternal Grandparents; Stroke: Yes - Mother; Thyroid Problems: No; Tuberculosis: No; Current every day smoker - 1 pack a day; Marital Status - Single; Alcohol Use: Never; Drug Use: No History; Caffeine Use: Moderate; Financial Concerns: No; Food, Clothing or Shelter Needs: No; Support System Lacking: No; Transportation Concerns: No Engineer, maintenance) Signed: 11/14/2022 1:30:14 PM By: Fredirick Maudlin MD FACS Entered By: Fredirick Maudlin on 11/14/2022 11:55:12 -------------------------------------------------------------------------------- SuperBill Details Patient Name: Date of Service: Ryan LLA S, PA TRICK E. 11/14/2022 Medical Record Number: [id number] Patient Account Number: [id number] Date of Birth/Sex: Treating RN: 03-07-1966 (57 y.o. M) Primary Care Provider: Irven Shelling Other Clinician: Referring Provider: Treating Provider/Extender: Donley Redder in Treatment: 7 Diagnosis Coding ICD-10 Codes Code Description [phone] Non-pressure chronic ulcer of other part of left foot with fat layer exposed E11.621 Type 2 diabetes mellitus with foot ulcer Facility Procedures : CPT4 Code: [id number] Description: 26834 - DEB SUBQ  TISSUE 20 SQ CM/< ICD-10 Diagnosis Description L97.522 Non-pressure chronic ulcer of other part of left foot with fat layer exposed Modifier: Quantity: 1 Physician Procedures : CPT4 Code Description Modifier [id number] - WC PHYS LEVEL 4 - EST PT 25 ICD-10 Diagnosis Description L97.522 Non-pressure chronic ulcer of other part of left foot with fat layer exposed E11.621 Type 2 diabetes mellitus with foot ulcer Quantity: 1 : [id number] 94174 - WC PHYS SUBQ TISS 20 SQ CM ICD-10 Diagnosis Description L97.522 Non-pressure chronic ulcer of other part of left foot with fat layer exposed Quantity: 1 Electronic Signature([name]) Signed: 11/14/2022 12:00:26 PM By: Fredirick Maudlin MD FACS Entered By: Fredirick Maudlin on 11/14/2022 12:00:26

## 2022-11-14 NOTE — Progress Notes (Signed)
DRAKE, WUERTZ ([id number]) [id number] Page 1 of 8 Visit Report for 11/14/2022 Arrival Information Details Patient Name: Date of Service: Ryan [name], [place] TRICK E. 11/14/2022 10:15 A M Medical Record Number: [id number] Patient Account Number: [id number] Date of Birth/Sex: Treating RN: 01/28/1966 (57 y.o. M) Primary Care Allysia Ingles: Irven Shelling Other Clinician: Referring Allien Melberg: Treating Daimion Adamcik/Extender: Donley Redder in Treatment: 7 Visit Information History Since Last Visit All ordered tests and consults were completed: No Patient Arrived: Kasandra Knudsen Added or deleted any medications: No Arrival Time: 10:12 Any new allergies or adverse reactions: No Accompanied By: self Had a fall or experienced change in No Transfer Assistance: None activities of daily living that may affect Patient Identification Verified: Yes risk of falls: Secondary Verification Process Completed: Yes Signs or symptoms of abuse/neglect since last visito No Patient Requires Transmission-Based Precautions: No Hospitalized since last visit: No Patient Has Alerts: No Implantable device outside of the clinic excluding No cellular tissue based products placed in the center since last visit: Pain Present Now: No Electronic Signature(s) Signed: 11/14/2022 10:36:23 AM By: Worthy Rancher Entered By: Worthy Rancher on 11/14/2022 10:12:38 -------------------------------------------------------------------------------- Encounter Discharge Information Details Patient Name: Date of Service: Ryan LLA S, PA TRICK E. 11/14/2022 10:15 A M Medical Record Number: [id number] Patient Account Number: [id number] Date of Birth/Sex: Treating RN: 09-25-1966 (57 y.o. Ryan [name] Primary Care Teliah Buffalo: Irven Shelling Other Clinician: Referring Arvie Bartholomew: Treating Yahsir Wickens/Extender: Donley Redder in Treatment: 7 Encounter Discharge Information Items Post Procedure  Vitals Discharge Condition: Stable Temperature (F): 97.6 Ambulatory Status: Cane Pulse (bpm): 89 Discharge Destination: Home Respiratory Rate (breaths/min): 20 Transportation: Private Auto Blood Pressure (mmHg): 161/92 Accompanied By: himself Schedule Follow-up Appointment: Yes Clinical Summary of Care: Patient Declined Electronic Signature(s) Signed: 11/14/2022 12:49:58 PM By: Dellie Catholic RN Entered By: Dellie Catholic on 11/14/2022 12:49:05 Bari Edward ([id number]) [id number] Page 2 of 8 -------------------------------------------------------------------------------- Lower Extremity Assessment Details Patient Name: Date of Service: Ryan LLA S, PA TRICK E. 11/14/2022 10:15 A M Medical Record Number: [id number] Patient Account Number: [id number] Date of Birth/Sex: Treating RN: 06-Jul-1966 (57 y.o. Ryan [name] Primary Care Keandre Linden: Irven Shelling Other Clinician: Referring Terri Malerba: Treating Arleth Mccullar/Extender: Donley Redder in Treatment: 7 Edema Assessment Assessed: Shirlyn Goltz: No] Patrice Paradise: No] [Left: Edema] [Right: :] Calf Left: Right: Point of Measurement: From Medial Instep 39 cm Ankle Left: Right: Point of Measurement: From Medial Instep 29 cm Vascular Assessment Pulses: Dorsalis Pedis Palpable: [Left:Yes] Electronic Signature(s) Signed: 11/14/2022 12:49:58 PM By: Dellie Catholic RN Entered By: Dellie Catholic on 11/14/2022 10:44:13 -------------------------------------------------------------------------------- Multi Wound Chart Details Patient Name: Date of Service: Ryan Michaell Cowing, PA TRICK E. 11/14/2022 10:15 A M Medical Record Number: [id number] Patient Account Number: [id number] Date of Birth/Sex: Treating RN: 13-Jul-1966 (57 y.o. M) Primary Care Decker Cogdell: Irven Shelling Other Clinician: Referring Freddi Schrager: Treating Ishitha Roper/Extender: Donley Redder in Treatment: 7 Vital Signs Height(in):  73 Pulse(bpm): 89 Weight(lbs): 200 Blood Pressure(mmHg): 161/92 Body Mass Index(BMI): 26.4 Temperature(F): 97.6 Respiratory Rate(breaths/min): 20 [2:Photos:] [N/A:N/A [id number] Page 3 of 8] Left, Proximal, Lateral Foot Left, Distal, Lateral Foot N/A Wound Location: Hematoma Hematoma N/A Wounding Event: Diabetic Wound/Ulcer of the Lower Diabetic Wound/Ulcer of the Lower N/A Primary Etiology: Extremity Extremity Sleep Apnea, Type II Diabetes, Sleep Apnea, Type II Diabetes, N/A Comorbid History: Neuropathy Neuropathy 10/24/2022 10/24/2022 N/A Date Acquired: 0 0 N/A Weeks of Treatment: Open Open N/A Wound Status: No No N/A Wound Recurrence: 0.6x0.6x0.3 0.4x1x0.3 N/A Measurements L x W x  D (cm) 0.283 0.314 N/A A (cm) : rea 0.085 0.094 N/A Volume (cm) : 10 9 Starting Position 1 (o'clock): 1 12 Ending Position 1 (o'clock): 0.5 0.3 Maximum Distance 1 (cm): Yes Yes N/A Undermining: Grade 1 Grade 1 N/A Classification: Medium Medium N/A Exudate A mount: Serosanguineous Serosanguineous N/A Exudate Type: red, brown red, brown N/A Exudate Color: Medium (34-66%) Medium (34-66%) N/A Granulation A mount: Pink Pink N/A Granulation Quality: Medium (34-66%) Medium (34-66%) N/A Necrotic A mount: Fat Layer (Subcutaneous Tissue): Yes Fat Layer (Subcutaneous Tissue): Yes N/A Exposed Structures: Fascia: No Tendon: No Muscle: No Joint: No Bone: No N/A None N/A Epithelialization: Debridement - Excisional Debridement - Excisional N/A Debridement: Pre-procedure Verification/Time Out 11:00 11:00 N/A Taken: Lidocaine 5% topical ointment Lidocaine 5% topical ointment N/A Pain Control: Other, Callus, Subcutaneous, Slough Other, Subcutaneous, Slough N/A Tissue Debrided: Skin/Subcutaneous Tissue Skin/Subcutaneous Tissue N/A Level: 0.36 0.4 N/A Debridement A (sq cm): rea Curette Curette N/A Instrument: Minimum Minimum N/A Bleeding: Pressure  Pressure N/A Hemostasis A chieved: 0 0 N/A Procedural Pain: 0 0 N/A Post Procedural Pain: Procedure was tolerated well Procedure was tolerated well N/A Debridement Treatment Response: 0.6x0.6x0.3 0.4x1x0.3 N/A Post Debridement Measurements L x W x D (cm) 0.085 0.094 N/A Post Debridement Volume: (cm) Scarring: Yes No Abnormalities Noted N/A Periwound Skin Texture: No Abnormalities Noted No Abnormalities Noted N/A Periwound Skin Moisture: No Abnormalities Noted No Abnormalities Noted N/A Periwound Skin Color: No Abnormality No Abnormality N/A Temperature: Debridement Debridement N/A Procedures Performed: Treatment Notes Electronic Signature(s) Signed: 11/14/2022 11:51:11 AM By: Duanne Guess MD FACS Entered By: Duanne Guess on 11/14/2022 11:51:11 -------------------------------------------------------------------------------- Multi-Disciplinary Care Plan Details Patient Name: Date of Service: Ryan LLA S, PA TRICK E. 11/14/2022 10:15 A M Medical Record Number: [id number] Patient Account Number: [id number] Date of Birth/Sex: Treating RN: 05-13-1966 (57 y.o. Ryan [name] Primary Care Jordon Kristiansen: [name] Other Clinician: Referring Moe Brier: Treating Kenechukwu Eckstein/Extender: Inda Merlin in Treatment: [address] EDON, HOADLEY ([id number]) [id number] Page 4 of 8 Nutrition Nursing Diagnoses: Impaired glucose control: actual or potential Goals: Patient/caregiver verbalizes understanding of need to maintain therapeutic glucose control per primary care physician Date Initiated: 11/14/2022 Target Resolution Date: 03/10/2023 Goal Status: Active Interventions: Provide education on elevated blood sugars and impact on wound healing Treatment Activities: Dietary management education, guidance and counseling : 11/14/2022 Notes: Electronic Signature(s) Signed: 11/14/2022 12:49:58 PM By: Karie Schwalbe RN Entered By:  Karie Schwalbe on 11/14/2022 12:47:24 -------------------------------------------------------------------------------- Pain Assessment Details Patient Name: Date of Service: Ryan Corky Sing, PA TRICK E. 11/14/2022 10:15 A M Medical Record Number: [id number] Patient Account Number: [id number] Date of Birth/Sex: Treating RN: 13-Sep-1966 (57 y.o. M) Primary Care Mathew Storck: [name] Other Clinician: Referring Elsye Mccollister: Treating Herrick Hartog/Extender: Inda Merlin in Treatment: 7 Active Problems Location of Pain Severity and Description of Pain Patient Has Paino Yes Site Locations Rate the pain. Current Pain Level: 6 Worst Pain Level: 10 Least Pain Level: 0 Tolerable Pain Level: 2 Pain Management and Medication Current Pain Management: Electronic Signature(s) Signed: 11/14/2022 10:36:23 AM By: Dayton Scrape Entered By: Dayton Scrape on 11/14/2022 10:13:19 Blanchard Kelch ([id number]) [id number] Page 5 of 8 -------------------------------------------------------------------------------- Patient/Caregiver Education Details Patient Name: Date of Service: Ryan Corky Sing, PA TRICK E. 1/5/2024andnbsp10:15 A M Medical Record Number: [id number] Patient Account Number: [id number] Date of Birth/Gender: Treating RN: 09/15/1966 (57 y.o. Ryan [name] Primary Care Physician: [name] Other Clinician: Referring Physician: Treating Physician/Extender: Inda Merlin in Treatment: 7 Education Assessment  Education Provided To: Patient Education Topics Provided Nutrition: Methods: Explain/Verbal Responses: Return demonstration correctly Electronic Signature(s) Signed: 11/14/2022 12:49:58 PM By: Dellie Catholic RN Entered By: Dellie Catholic on 11/14/2022 12:47:37 -------------------------------------------------------------------------------- Wound Assessment Details Patient Name: Date of Service: Ryan Michaell Cowing, PA TRICK E.  11/14/2022 10:15 A M Medical Record Number: [id number] Patient Account Number: [id number] Date of Birth/Sex: Treating RN: 11-Jul-1966 (57 y.o. Ryan [name] Primary Care Zooey Schreurs: Irven Shelling Other Clinician: Referring Adell Koval: Treating Rylon Poitra/Extender: Donley Redder in Treatment: 7 Wound Status Wound Number: 2 Primary Etiology: Diabetic Wound/Ulcer of the Lower Extremity Wound Location: Left, Proximal, Lateral Foot Wound Status: Open Wounding Event: Hematoma Comorbid History: Sleep Apnea, Type II Diabetes, Neuropathy Date Acquired: 10/24/2022 Weeks Of Treatment: 0 Clustered Wound: No Photos Wound Measurements Length: (cm) 0 Width: (cm) 0 Hale, Aristides E ([id number]) Depth: (cm) Area: (cm) Volume: (cm) .6 % Reduction in Area: .6 % Reduction in Volume: [id number] Page 6 of 8 0.3 Tunneling: No 0.283 Undermining: Yes 0.085 Starting Position (o'clock): 10 Ending Position (o'clock): 1 Maximum Distance: (cm) 0.5 Wound Description Classification: Grade 1 Exudate Amount: Medium Exudate Type: Serosanguineous Exudate Color: red, brown Foul Odor After Cleansing: No Slough/Fibrino Yes Wound Bed Granulation Amount: Medium (34-66%) Exposed Structure Granulation Quality: Pink Fascia Exposed: No Necrotic Amount: Medium (34-66%) Fat Layer (Subcutaneous Tissue) Exposed: Yes Necrotic Quality: Adherent Slough Tendon Exposed: No Muscle Exposed: No Joint Exposed: No Bone Exposed: No Periwound Skin Texture Texture Color No Abnormalities Noted: No No Abnormalities Noted: Yes Scarring: Yes Temperature / Pain Temperature: No Abnormality Moisture No Abnormalities Noted: Yes Treatment Notes Wound #2 (Foot) Wound Laterality: Left, Lateral, Proximal Cleanser Soap and Water Discharge Instruction: May shower and wash wound with dial antibacterial soap and water prior to dressing change. Wound Cleanser Discharge  Instruction: Cleanse the wound with wound cleanser prior to applying a clean dressing using gauze sponges, not tissue or cotton balls. Peri-Wound Care Topical Primary Dressing Sorbalgon AG Dressing 2x2 (in/in) Discharge Instruction: Apply to wound bed as instructed Secondary Dressing Optifoam Non-Adhesive Dressing, 4x4 in Discharge Instruction: Apply over primary dressing as directed. Woven Gauze Sponges 2x2 in Discharge Instruction: Apply over primary dressing as directed. Secured With [employer] Surgical T 2x10 (in/yd) ape Discharge Instruction: Secure with tape as directed. Compression Wrap Compression Stockings Add-Ons Electronic Signature(s) Signed: 11/14/2022 12:49:58 PM By: Dellie Catholic RN Entered By: Dellie Catholic on 11/14/2022 10:58:57 JAKIM, DRAPEAU E ([id number]) [id number] Page 7 of 8 -------------------------------------------------------------------------------- Wound Assessment Details Patient Name: Date of Service: Ryan LLA S, PA TRICK E. 11/14/2022 10:15 A M Medical Record Number: [id number] Patient Account Number: [id number] Date of Birth/Sex: Treating RN: [birth year] (58 y.o. Ryan [name] Primary Care Schylar Wuebker: Irven Shelling Other Clinician: Referring Shacara Cozine: Treating Marguerita Stapp/Extender: Donley Redder in Treatment: 7 Wound Status Wound Number: 3 Primary Etiology: Diabetic Wound/Ulcer of the Lower Extremity Wound Location: Left, Distal, Lateral Foot Wound Status: Open Wounding Event: Hematoma Comorbid History: Sleep Apnea, Type II Diabetes, Neuropathy Date Acquired: 10/24/2022 Weeks Of Treatment: 0 Clustered Wound: No Photos Wound Measurements Length: (cm) 0.4 Width: (cm) 1 Depth: (cm) 0.3 Area: (cm) 0.314 Volume: (cm) 0.094 % Reduction in Area: % Reduction in Volume: Epithelialization: None Tunneling: No Undermining: Yes Starting Position (o'clock): 9 Ending Position  (o'clock): 12 Maximum Distance: (cm) 0.3 Wound Description Classification: Grade 1 Exudate Amount: Medium Exudate Type: Serosanguineous Exudate Color: red, brown Foul Odor After Cleansing: No Slough/Fibrino Yes Wound Bed Granulation Amount: Medium (34-66%) Exposed Structure Granulation  Quality: Pink Fat Layer (Subcutaneous Tissue) Exposed: Yes Necrotic Amount: Medium (34-66%) Necrotic Quality: Adherent Slough Periwound Skin Texture Texture Color No Abnormalities Noted: Yes No Abnormalities Noted: Yes Moisture Temperature / Pain No Abnormalities Noted: Yes Temperature: No Abnormality Treatment Notes Wound #3 (Foot) Wound Laterality: Left, Lateral, Distal Cleanser Soap and Water Discharge Instruction: May shower and wash wound with dial antibacterial soap and water prior to dressing change. BAYLIN, GAMBLIN ([id number]) [id number] Page 8 of 8 Wound Cleanser Discharge Instruction: Cleanse the wound with wound cleanser prior to applying a clean dressing using gauze sponges, not tissue or cotton balls. Peri-Wound Care Topical Primary Dressing Sorbalgon AG Dressing 2x2 (in/in) Discharge Instruction: Apply to wound bed as instructed Secondary Dressing Optifoam Non-Adhesive Dressing, 4x4 in Discharge Instruction: Apply over primary dressing as directed. Woven Gauze Sponges 2x2 in Discharge Instruction: Apply over primary dressing as directed. Secured With [employer] T 2x10 (in/yd) ape Discharge Instruction: Secure with tape as directed. Compression Wrap Compression Stockings Add-Ons Electronic Signature(s) Signed: 11/14/2022 12:49:58 PM By: Karie Schwalbe RN Entered By: Karie Schwalbe on 11/14/2022 10:59:25 -------------------------------------------------------------------------------- Vitals Details Patient Name: Date of Service: Ryan LLA S, PA TRICK E. 11/14/2022 10:15 A M Medical Record Number: [id number] Patient Account  Number: [id number] Date of Birth/Sex: Treating RN: 07/13/1966 (57 y.o. M) Primary Care Katelee Schupp: [name] Other Clinician: Referring Clytee Heinrich: Treating Lam Mccubbins/Extender: Inda Merlin in Treatment: 7 Vital Signs Time Taken: 10:12 Temperature (F): 97.6 Height (in): 73 Pulse (bpm): 89 Weight (lbs): 200 Respiratory Rate (breaths/min): 20 Body Mass Index (BMI): 26.4 Blood Pressure (mmHg): 161/92 Reference Range: 80 - 120 mg / dl Electronic Signature(s) Signed: 11/14/2022 10:36:23 AM By: Dayton Scrape Entered By: Dayton Scrape on 11/14/2022 10:13:03

## 2022-11-20 ENCOUNTER — Ambulatory Visit (HOSPITAL_BASED_OUTPATIENT_CLINIC_OR_DEPARTMENT_OTHER): Payer: Medicare HMO | Admitting: General Surgery

## 2022-11-24 ENCOUNTER — Encounter (HOSPITAL_BASED_OUTPATIENT_CLINIC_OR_DEPARTMENT_OTHER): Payer: Medicare HMO | Admitting: General Surgery

## 2022-11-25 ENCOUNTER — Ambulatory Visit (HOSPITAL_BASED_OUTPATIENT_CLINIC_OR_DEPARTMENT_OTHER): Payer: Medicare HMO | Admitting: General Surgery

## 2022-12-04 ENCOUNTER — Encounter (HOSPITAL_BASED_OUTPATIENT_CLINIC_OR_DEPARTMENT_OTHER): Payer: 59 | Admitting: General Surgery

## 2023-02-19 ENCOUNTER — Ambulatory Visit (HOSPITAL_BASED_OUTPATIENT_CLINIC_OR_DEPARTMENT_OTHER): Payer: 59 | Admitting: General Surgery

## 2023-02-24 ENCOUNTER — Inpatient Hospital Stay (HOSPITAL_COMMUNITY)
Admission: EM | Admit: 2023-02-24 | Discharge: 2023-03-07 | DRG: 464 | Disposition: A | Discharge status: Skilled Nursing Facility | Payer: 59 | Attending: Internal Medicine | Admitting: Internal Medicine

## 2023-02-24 ENCOUNTER — Other Ambulatory Visit: Payer: Self-pay

## 2023-02-24 ENCOUNTER — Emergency Department (HOSPITAL_COMMUNITY): Payer: 59

## 2023-02-24 ENCOUNTER — Encounter (HOSPITAL_COMMUNITY): Payer: Self-pay | Admitting: Emergency Medicine

## 2023-02-24 DIAGNOSIS — Z7409 Other reduced mobility: Secondary | ICD-10-CM | POA: Diagnosis present

## 2023-02-24 DIAGNOSIS — T8743 Infection of amputation stump, right lower extremity: Secondary | ICD-10-CM | POA: Diagnosis present

## 2023-02-24 DIAGNOSIS — Z1152 Encounter for screening for COVID-19: Secondary | ICD-10-CM

## 2023-02-24 DIAGNOSIS — L0231 Cutaneous abscess of buttock: Secondary | ICD-10-CM

## 2023-02-24 DIAGNOSIS — Z79899 Other long term (current) drug therapy: Secondary | ICD-10-CM

## 2023-02-24 DIAGNOSIS — L0232 Furuncle of buttock: Secondary | ICD-10-CM | POA: Diagnosis present

## 2023-02-24 DIAGNOSIS — Z79891 Long term (current) use of opiate analgesic: Secondary | ICD-10-CM

## 2023-02-24 DIAGNOSIS — E1169 Type 2 diabetes mellitus with other specified complication: Secondary | ICD-10-CM | POA: Diagnosis present

## 2023-02-24 DIAGNOSIS — L0390 Cellulitis, unspecified: Principal | ICD-10-CM

## 2023-02-24 DIAGNOSIS — T8781 Dehiscence of amputation stump: Secondary | ICD-10-CM | POA: Diagnosis not present

## 2023-02-24 DIAGNOSIS — F17200 Nicotine dependence, unspecified, uncomplicated: Secondary | ICD-10-CM | POA: Diagnosis present

## 2023-02-24 DIAGNOSIS — L02413 Cutaneous abscess of right upper limb: Secondary | ICD-10-CM | POA: Diagnosis present

## 2023-02-24 DIAGNOSIS — Z635 Disruption of family by separation and divorce: Secondary | ICD-10-CM

## 2023-02-24 DIAGNOSIS — L0291 Cutaneous abscess, unspecified: Secondary | ICD-10-CM

## 2023-02-24 DIAGNOSIS — J45909 Unspecified asthma, uncomplicated: Secondary | ICD-10-CM | POA: Diagnosis present

## 2023-02-24 DIAGNOSIS — M86261 Subacute osteomyelitis, right tibia and fibula: Secondary | ICD-10-CM

## 2023-02-24 DIAGNOSIS — Z5941 Food insecurity: Secondary | ICD-10-CM

## 2023-02-24 DIAGNOSIS — Z89511 Acquired absence of right leg below knee: Secondary | ICD-10-CM

## 2023-02-24 DIAGNOSIS — E1165 Type 2 diabetes mellitus with hyperglycemia: Secondary | ICD-10-CM | POA: Diagnosis present

## 2023-02-24 DIAGNOSIS — S41001A Unspecified open wound of right shoulder, initial encounter: Secondary | ICD-10-CM

## 2023-02-24 DIAGNOSIS — Z91199 Patient's noncompliance with other medical treatment and regimen due to unspecified reason: Secondary | ICD-10-CM

## 2023-02-24 DIAGNOSIS — K219 Gastro-esophageal reflux disease without esophagitis: Secondary | ICD-10-CM | POA: Diagnosis present

## 2023-02-24 DIAGNOSIS — Y835 Amputation of limb(s) as the cause of abnormal reaction of the patient, or of later complication, without mention of misadventure at the time of the procedure: Secondary | ICD-10-CM | POA: Diagnosis present

## 2023-02-24 DIAGNOSIS — B9562 Methicillin resistant Staphylococcus aureus infection as the cause of diseases classified elsewhere: Secondary | ICD-10-CM | POA: Diagnosis present

## 2023-02-24 DIAGNOSIS — F17210 Nicotine dependence, cigarettes, uncomplicated: Secondary | ICD-10-CM | POA: Diagnosis present

## 2023-02-24 DIAGNOSIS — R739 Hyperglycemia, unspecified: Secondary | ICD-10-CM

## 2023-02-24 DIAGNOSIS — L03115 Cellulitis of right lower limb: Secondary | ICD-10-CM | POA: Diagnosis present

## 2023-02-24 DIAGNOSIS — G894 Chronic pain syndrome: Secondary | ICD-10-CM | POA: Diagnosis present

## 2023-02-24 DIAGNOSIS — M25511 Pain in right shoulder: Secondary | ICD-10-CM | POA: Diagnosis not present

## 2023-02-24 DIAGNOSIS — Z7984 Long term (current) use of oral hypoglycemic drugs: Secondary | ICD-10-CM

## 2023-02-24 HISTORY — DX: Cutaneous abscess of buttock: L02.31

## 2023-02-24 HISTORY — DX: Long term (current) use of opiate analgesic: Z79.891

## 2023-02-24 HISTORY — DX: Type 2 diabetes mellitus with hyperglycemia: E11.65

## 2023-02-24 HISTORY — DX: Unspecified open wound of right shoulder, initial encounter: S41.001A

## 2023-02-24 LAB — RESP PANEL BY RT-PCR (RSV, FLU A&B, COVID)  RVPGX2
Influenza A by PCR: NEGATIVE
Influenza B by PCR: NEGATIVE
Resp Syncytial Virus by PCR: NEGATIVE
SARS Coronavirus 2 by RT PCR: NEGATIVE

## 2023-02-24 LAB — CBC WITH DIFFERENTIAL/PLATELET
Abs Immature Granulocytes: 0.06 10*3/uL (ref 0.00–0.07)
Basophils Absolute: 0.1 10*3/uL (ref 0.0–0.1)
Basophils Relative: 1 %
Eosinophils Absolute: 0.1 10*3/uL (ref 0.0–0.5)
Eosinophils Relative: 1 %
HCT: 35.1 % — ABNORMAL LOW (ref 39.0–52.0)
Hemoglobin: 11.4 g/dL — ABNORMAL LOW (ref 13.0–17.0)
Immature Granulocytes: 1 %
Lymphocytes Relative: 14 %
Lymphs Abs: 1.7 10*3/uL (ref 0.7–4.0)
MCH: 29.3 pg (ref 26.0–34.0)
MCHC: 32.5 g/dL (ref 30.0–36.0)
MCV: 90.2 fL (ref 80.0–100.0)
Monocytes Absolute: 1.1 10*3/uL — ABNORMAL HIGH (ref 0.1–1.0)
Monocytes Relative: 9 %
Neutro Abs: 8.9 10*3/uL — ABNORMAL HIGH (ref 1.7–7.7)
Neutrophils Relative %: 74 %
Platelets: 396 10*3/uL (ref 150–400)
RBC: 3.89 MIL/uL — ABNORMAL LOW (ref 4.22–5.81)
RDW: 12.8 % (ref 11.5–15.5)
WBC: 11.9 10*3/uL — ABNORMAL HIGH (ref 4.0–10.5)
nRBC: 0 % (ref 0.0–0.2)

## 2023-02-24 LAB — URINALYSIS, ROUTINE W REFLEX MICROSCOPIC
Bacteria, UA: NONE SEEN
Bilirubin Urine: NEGATIVE
Glucose, UA: >=500 mg/dL — AB
Hgb urine dipstick: NEGATIVE
Ketones, ur: NEGATIVE mg/dL
Leukocytes,Ua: NEGATIVE
Nitrite: NEGATIVE
Protein, ur: 100 mg/dL — AB
Specific Gravity, Urine: 1.028 (ref 1.005–1.030)
pH: 6 (ref 5.0–8.0)

## 2023-02-24 LAB — COMPREHENSIVE METABOLIC PANEL
ALT: 13 U/L (ref 0–44)
AST: 14 U/L — ABNORMAL LOW (ref 15–41)
Albumin: 2.1 g/dL — ABNORMAL LOW (ref 3.5–5.0)
Alkaline Phosphatase: 104 U/L (ref 38–126)
Anion gap: 11 (ref 5–15)
BUN: 17 mg/dL (ref 6–20)
CO2: 26 mmol/L (ref 22–32)
Calcium: 8.6 mg/dL — ABNORMAL LOW (ref 8.9–10.3)
Chloride: 90 mmol/L — ABNORMAL LOW (ref 98–111)
Creatinine, Ser: 1.13 mg/dL (ref 0.61–1.24)
GFR, Estimated: >60 mL/min (ref >60)
Glucose, Bld: 648 mg/dL (ref 70–99)
Potassium: 4.3 mmol/L (ref 3.5–5.1)
Sodium: 127 mmol/L — ABNORMAL LOW (ref 135–145)
Total Bilirubin: 0.4 mg/dL (ref 0.3–1.2)
Total Protein: 6.7 g/dL (ref 6.5–8.1)

## 2023-02-24 LAB — SEDIMENTATION RATE: Sed Rate: 113 mm/hr — ABNORMAL HIGH (ref 0–16)

## 2023-02-24 LAB — LACTIC ACID, PLASMA: Lactic Acid, Venous: 3 mmol/L (ref 0.5–1.9)

## 2023-02-24 LAB — CBG MONITORING, ED: Glucose-Capillary: 478 mg/dL — ABNORMAL HIGH (ref 70–99)

## 2023-02-24 LAB — BETA-HYDROXYBUTYRIC ACID: Beta-Hydroxybutyric Acid: 0.08 mmol/L (ref 0.05–0.27)

## 2023-02-24 LAB — OSMOLALITY: Osmolality: 312 mOsm/kg — ABNORMAL HIGH (ref 275–295)

## 2023-02-24 LAB — C-REACTIVE PROTEIN: CRP: 11.2 mg/dL — ABNORMAL HIGH (ref <1.0)

## 2023-02-24 LAB — MAGNESIUM: Magnesium: 2.1 mg/dL (ref 1.7–2.4)

## 2023-02-24 MED ORDER — VANCOMYCIN HCL IN DEXTROSE 1-5 GM/200ML-% IV SOLN: 1000.0000 mg | Freq: Once | INTRAVENOUS | Status: DC

## 2023-02-24 MED ORDER — VANCOMYCIN HCL IN DEXTROSE 1-5 GM/200ML-% IV SOLN: 1000.0000 mg | Freq: Two times a day (BID) | INTRAVENOUS | Status: DC

## 2023-02-24 MED ORDER — FENTANYL CITRATE PF 50 MCG/ML IJ SOSY: 100.0000 ug | PREFILLED_SYRINGE | INTRAMUSCULAR | Status: DC | PRN

## 2023-02-24 MED ORDER — SODIUM CHLORIDE 0.9 % IV BOLUS (SEPSIS)
1000.0000 mL | Freq: Once | INTRAVENOUS | Status: AC
  Administered 2023-02-24: 1000 mL via INTRAVENOUS

## 2023-02-24 MED ORDER — SODIUM CHLORIDE 0.9 % IV SOLN
2.0000 g | Freq: Once | INTRAVENOUS | Status: AC
  Administered 2023-02-24: 2 g via INTRAVENOUS
  Filled 2023-02-24: qty 20

## 2023-02-24 MED ORDER — INSULIN ASPART 100 UNIT/ML IJ SOLN
10.0000 [IU] | Freq: Once | INTRAMUSCULAR | Status: AC
  Administered 2023-02-24: 10 [IU] via SUBCUTANEOUS

## 2023-02-24 MED ORDER — FENTANYL CITRATE PF 50 MCG/ML IJ SOSY
100.0000 ug | PREFILLED_SYRINGE | Freq: Once | INTRAMUSCULAR | Status: AC
  Administered 2023-02-24: 100 ug via INTRAVENOUS
  Filled 2023-02-24: qty 2

## 2023-02-24 MED ORDER — LACTATED RINGERS IV SOLN: INTRAVENOUS | Status: AC

## 2023-02-24 MED ORDER — IOHEXOL 350 MG/ML SOLN
50.0000 mL | Freq: Once | INTRAVENOUS | Status: AC | PRN
  Administered 2023-02-24: 50 mL via INTRAVENOUS

## 2023-02-24 MED ORDER — OXYCODONE HCL 5 MG PO TABS
15.0000 mg | ORAL_TABLET | ORAL | Status: DC | PRN
  Administered 2023-02-24 – 2023-03-07 (×54): 15 mg via ORAL
  Filled 2023-02-24 (×54): qty 3

## 2023-02-24 MED ORDER — SODIUM CHLORIDE 0.9 % IV BOLUS (SEPSIS)
500.0000 mL | Freq: Once | INTRAVENOUS | Status: AC
  Administered 2023-02-24: 500 mL via INTRAVENOUS

## 2023-02-24 MED ORDER — VANCOMYCIN HCL 2000 MG/400ML IV SOLN
2000.0000 mg | Freq: Once | INTRAVENOUS | Status: AC
  Administered 2023-02-24: 2000 mg via INTRAVENOUS
  Filled 2023-02-24: qty 400

## 2023-02-24 NOTE — Assessment & Plan Note (Signed)
Pt can afford his monthly oxycodone and neurontin but claims he cannot afford medications for his diabetes.

## 2023-02-24 NOTE — ED Provider Notes (Signed)
[place] EMERGENCY DEPARTMENT AT [facility] Provider Note   CSN: [id number] Arrival date & time: 02/24/23  1640     History  Chief Complaint  Patient presents with   Wound Infection    Ryan [name] is a 57 y.o. male.  HPI Patient presents for multiple wounds.  Medical history includes arthritis, asthma, DM, GERD, sleep apnea.  He has had areas of tenderness and erythema to right posterior thigh and right posterior shoulder for the past 2 to 3 weeks.  These wounds have been draining pus.  3 days ago, he developed a wound to his left buttock which has also been draining pus.  In addition, patient has had worsened pain to area of his right stump.  He denies any recent fevers or chills.  He had an episode of vomiting yesterday which he attributes to his GERD.  He has not been on diabetes medications in the past year.  Previously, he was on metformin, insulin, and Trulicity.      Home Medications Prior to Admission medications   Medication Sig Start Date End Date Taking? Authorizing Provider  albuterol (PROVENTIL HFA;VENTOLIN HFA) 108 (90 BASE) MCG/ACT inhaler Inhale 2 puffs into the lungs every 6 (six) hours as needed.    [provider]  atorvastatin (LIPITOR) 20 MG tablet Take 20 mg by mouth at bedtime.    [provider]  glimepiride (AMARYL) 4 MG tablet Take 8 mg by mouth daily.    [provider]  magnesium oxide (MAG-OX) 400 MG tablet Take 400 mg by mouth daily.    [provider]  metFORMIN (GLUCOPHAGE) 1000 MG tablet Take 1,000 mg by mouth 2 (two) times daily with a meal.    [provider]  oxycodone (ROXICODONE) 30 MG immediate release tablet Take 30 mg by mouth every 6 (six) hours.    [provider]  pregabalin (LYRICA) 150 MG capsule Take 150 mg by mouth at bedtime.    [provider]      Allergies    Acetaminophen    Review of Systems   Review of Systems  Skin:  Positive for wound.  All  other systems reviewed and are negative.   Physical Exam Updated Vital Signs BP 136/86   Pulse 98   Temp 98.2 F (36.8 C) (Oral)   Resp 11   Ht  (1.854 m)   Wt 105.6 kg   SpO2 100%   BMI 30.71 kg/m  Physical Exam Vitals and nursing note reviewed.  Constitutional:      General: He is not in acute distress.    Appearance: Normal appearance. He is well-developed. He is ill-appearing. He is not toxic-appearing or diaphoretic.  HENT:     Head: Normocephalic and atraumatic.     Right Ear: External ear normal.     Left Ear: External ear normal.     Nose: Nose normal.     Mouth/Throat:     Mouth: Mucous membranes are moist.  Eyes:     Extraocular Movements: Extraocular movements intact.     Conjunctiva/sclera: Conjunctivae normal.  Cardiovascular:     Rate and Rhythm: Normal rate and regular rhythm.  Pulmonary:     Effort: Pulmonary effort is normal. No respiratory distress.  Abdominal:     General: There is no distension.     Palpations: Abdomen is soft.     Tenderness: There is no abdominal tenderness.  Musculoskeletal:     Cervical back: Normal range of  motion and neck supple.     Left lower leg: Edema present.     Comments: Right BKA is present.  Distal stump has mild erythema and tenderness.  Skin:    General: Skin is warm and dry.     Coloration: Skin is not jaundiced or pale.     Comments: Multiple wounds with active purulent drainage.  Neurological:     General: No focal deficit present.     Mental Status: He is alert and oriented to person, place, and time.     Cranial Nerves: No cranial nerve deficit.     Sensory: No sensory deficit.     Motor: No weakness.     Coordination: Coordination normal.  Psychiatric:        Mood and Affect: Mood normal.        Behavior: Behavior normal.        Thought Content: Thought content normal.        Judgment: Judgment normal.            ED Results / Procedures / Treatments   Labs (all labs ordered are  listed, but only abnormal results are displayed) Labs Reviewed  COMPREHENSIVE METABOLIC PANEL - Abnormal; Notable for the following components:      Result Value   Sodium 127 (*)    Chloride 90 (*)    Glucose, Bld 648 (*)    Calcium 8.6 (*)    Albumin 2.1 (*)    AST 14 (*)    All other components within normal limits  CBC WITH DIFFERENTIAL/PLATELET - Abnormal; Notable for the following components:   WBC 11.9 (*)    RBC 3.89 (*)    Hemoglobin 11.4 (*)    HCT 35.1 (*)    Neutro Abs 8.9 (*)    Monocytes Absolute 1.1 (*)    All other components within normal limits  LACTIC ACID, PLASMA - Abnormal; Notable for the following components:   Lactic Acid, Venous 3.0 (*)    All other components within normal limits  SEDIMENTATION RATE - Abnormal; Notable for the following components:   Sed Rate 113 (*)    All other components within normal limits  C-REACTIVE PROTEIN - Abnormal; Notable for the following components:   CRP 11.2 (*)    All other components within normal limits  URINALYSIS, ROUTINE W REFLEX MICROSCOPIC - Abnormal; Notable for the following components:   Color, Urine STRAW (*)    Glucose, UA >=500 (*)    Protein, ur 100 (*)    All other components within normal limits  RESP PANEL BY RT-PCR (RSV, FLU A&B, COVID)  RVPGX2  CULTURE, BLOOD (ROUTINE X 2)  CULTURE, BLOOD (ROUTINE X 2)  MAGNESIUM  BETA-HYDROXYBUTYRIC ACID  OSMOLALITY    EKG EKG Interpretation  Date/Time:  Tuesday February 24 2023 19:27:56 EDT Ventricular Rate:  98 PR Interval:  144 QRS Duration: 91 QT Interval:  352 QTC Calculation: 450 R Axis:   -27 Text Interpretation: Sinus rhythm LAE, consider biatrial enlargement Borderline left axis deviation Abnormal R-wave progression, early transition Confirmed by Gloris Manchester [phone]) on 02/24/2023 7:42:20 PM  Radiology DG Chest 1 View  Result Date: 02/24/2023 CLINICAL DATA:  Right leg stump pain EXAM: CHEST  1 VIEW COMPARISON:  07/09/2022 FINDINGS: Lungs are  essentially clear. Left infrahilar opacity, likely atelectasis. No pleural effusion or pneumothorax. The heart is normal in size. Median sternotomy. IMPRESSION: No acute cardiopulmonary disease. Electronically Signed   By: Charline Bills M.D.   On:  02/24/2023 20:21   DG Knee 2 Views Right  Result Date: 02/24/2023 CLINICAL DATA:  Stump pain of right leg below-knee EXAM: RIGHT KNEE - 1-2 VIEW; RIGHT FEMUR 2 VIEWS; PELVIS - 1-2 VIEW COMPARISON:  None Available. FINDINGS: No acute fracture or dislocation in the pelvis. Degenerative changes pubic symphysis, both hips, SI joints and lower lumbar spine. No acute fracture of the right femur. Right below-the-knee amputation. No acute fracture or dislocation of the right knee. No effusion. No evidence of osteomyelitis about the stump. Soft tissues are unremarkable. Vascular calcifications. The lateral view is limited due to external material projecting over the knee. IMPRESSION: 1. No acute fracture or dislocation in the pelvis, right femur, or knee. 2. Right below-the-knee amputation.  No acute osseous abnormality. Electronically Signed   By: Minerva Fester M.D.   On: 02/24/2023 20:20   DG Femur Min 2 Views Right  Result Date: 02/24/2023 CLINICAL DATA:  Stump pain of right leg below-knee EXAM: RIGHT KNEE - 1-2 VIEW; RIGHT FEMUR 2 VIEWS; PELVIS - 1-2 VIEW COMPARISON:  None Available. FINDINGS: No acute fracture or dislocation in the pelvis. Degenerative changes pubic symphysis, both hips, SI joints and lower lumbar spine. No acute fracture of the right femur. Right below-the-knee amputation. No acute fracture or dislocation of the right knee. No effusion. No evidence of osteomyelitis about the stump. Soft tissues are unremarkable. Vascular calcifications. The lateral view is limited due to external material projecting over the knee. IMPRESSION: 1. No acute fracture or dislocation in the pelvis, right femur, or knee. 2. Right below-the-knee amputation.  No acute  osseous abnormality. Electronically Signed   By: Minerva Fester M.D.   On: 02/24/2023 20:20   DG Pelvis 1-2 Views  Result Date: 02/24/2023 CLINICAL DATA:  Stump pain of right leg below-knee EXAM: RIGHT KNEE - 1-2 VIEW; RIGHT FEMUR 2 VIEWS; PELVIS - 1-2 VIEW COMPARISON:  None Available. FINDINGS: No acute fracture or dislocation in the pelvis. Degenerative changes pubic symphysis, both hips, SI joints and lower lumbar spine. No acute fracture of the right femur. Right below-the-knee amputation. No acute fracture or dislocation of the right knee. No effusion. No evidence of osteomyelitis about the stump. Soft tissues are unremarkable. Vascular calcifications. The lateral view is limited due to external material projecting over the knee. IMPRESSION: 1. No acute fracture or dislocation in the pelvis, right femur, or knee. 2. Right below-the-knee amputation.  No acute osseous abnormality. Electronically Signed   By: Minerva Fester M.D.   On: 02/24/2023 20:20    Procedures Procedures    Medications Ordered in ED Medications  lactated ringers infusion ( Intravenous New Bag/Given 02/24/23 2028)  sodium chloride 0.9 % bolus 1,000 mL (1,000 mLs Intravenous New Bag/Given 02/24/23 2028)    And  sodium chloride 0.9 % bolus 1,000 mL (1,000 mLs Intravenous New Bag/Given 02/24/23 2028)    And  sodium chloride 0.9 % bolus 1,000 mL (has no administration in time range)    And  sodium chloride 0.9 % bolus 500 mL (has no administration in time range)  cefTRIAXone (ROCEPHIN) 2 g in sodium chloride 0.9 % 100 mL IVPB (2 g Intravenous New Bag/Given 02/24/23 2030)  vancomycin (VANCOREADY) IVPB 2000 mg/400 mL (has no administration in time range)  fentaNYL (SUBLIMAZE) injection 100 mcg (has no administration in time range)  fentaNYL (SUBLIMAZE) injection 100 mcg (100 mcg Intravenous Given 02/24/23 2027)  iohexol (OMNIPAQUE) 350 MG/ML injection 50 mL (50 mLs Intravenous Contrast Given 02/24/23 2027)  ED Course/  Medical Decision Making/ A&P                             Medical Decision Making Amount and/or Complexity of Data Reviewed Labs: ordered. Radiology: ordered. ECG/medicine tests: ordered.  Risk Prescription drug management.   This patient presents to the ED for concern of purulent wounds, this involves an extensive number of treatment options, and is a complaint that carries with it a high risk of complications and morbidity.  The differential diagnosis includes cellulitis, necrotizing infection, DKA, HHS   Co morbidities that complicate the patient evaluation  arthritis, asthma, DM, GERD, sleep apnea   Additional history obtained:  Additional history obtained from N/A External records from outside source obtained and reviewed including EMR   Lab Tests:  I Ordered, and personally interpreted labs.  The pertinent results include: Hyperglycemia is present without acidosis.  Leukocytosis and lactic acidosis are present.  Anemia of unknown chronicity is present.  Inflammatory markers are elevated.   Imaging Studies ordered:  I ordered imaging studies including x-ray imaging of wound areas I independently visualized and interpreted imaging which showed no evidence of subcutaneous gas I agree with the radiologist interpretation   Cardiac Monitoring: / EKG:  The patient was maintained on a cardiac monitor.  I personally viewed and interpreted the cardiac monitored which showed an underlying rhythm of: Sinus rhythm   Problem List / ED Course / Critical interventions / Medication management  Patient presents for multiple purulent wounds.  Vital signs on arrival are notable for mild tachycardia.  Diagnostic workup was initiated prior to patient being bedded in the ED.  Initial lab work is notable for hyperglycemia without anion gap, leukocytosis, lactic acidosis, and mild elevation in CRP.  On exam, patient is alert and oriented.  Areas of wounds have erythema and active purulent  drainage.  Patient reports that he has been off of his diabetes medications for the past year.  IV fluids were ordered for hydration and for his hyperglycemia.  Patient was initiated on IV antibiotics.  Fentanyl was ordered for analgesia.  Imaging studies were ordered of areas of wounds.  On the studies, there was no evidence of subcutaneous gas.  Patient to be admitted for purulent cellulitis and hyperglycemia. I ordered medication including IV fluids for hyperglycemia; vancomycin and ceftriaxone for purulent cellulitis; fentanyl for analgesia Reevaluation of the patient after these medicines showed that the patient improved I have reviewed the patients home medicines and have made adjustments as needed   Social Determinants of Health:  Currently without PCP        Final Clinical Impression(s) / ED Diagnoses Final diagnoses:  Cellulitis, unspecified cellulitis site  Abscess  Hyperglycemia    Rx / DC Orders ED Discharge Orders     None         Gloris Manchester, MD 02/24/23 2039

## 2023-02-24 NOTE — ED Notes (Signed)
Patient transported to X-ray 

## 2023-02-24 NOTE — Subjective & Objective (Signed)
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  Patient does not seek regular medical care except for his chronic pain medications.  Patient pays for his chronic opiates but does not pay for any diabetes care.  He was last seen by wound care in January 2024 and has not been seen since then.  Multiple no-shows and cancellations to wound care clinic.  He states that his right shoulder wound has been there for about 3 to 4 weeks.  His left buttock wound started about 3 to 4 days ago.  He also has a right buttock wound that he will not show me.  He also has a sore spot on his right BKA stump that is not draining.  On arrival temp 98.6 heart rate 107, blood pressure 131/86 satting 97% on room air.  White count 11.9, hemoglobin 11.4, platelets of 396  Sodium 127, potassium 4.3, bicarb 26, BUN of 17, creatinine 1.1, serum glucose of 648  UA showed ferritin 500 glucose 100 protein.  Beta hydroxybutyric acid was negative.  CT chest was negative for pneumonia.  Right femur x-ray showed below-knee amputation.  No acute osseous lesions.  [facility] hospitalist contacted for admission.  Patient refused to let me examine his right buttock.  Was very difficult to have a conversation with as he is hyperfocused on his pain.  Would not let me examine the right buttock wound.  Was only able to examine the left buttock wound minimally as he continued complain of pain even with minimal pressure.  Overall a thoroughly unpleasant gentleman.

## 2023-02-24 NOTE — ED Provider Triage Note (Signed)
Emergency Medicine Provider Triage Evaluation Note  Ryan [name] , a 57 y.o. male  was evaluated in triage.  Pt complains of multiple wounds including to his right shoulder, both legs, and left buttock.  States that these wounds have been getting worse and draining puslike drainage.  He has a history of a right BKA and states that his surgeon would like to remove more of the leg and prompted him to come to the ED for further evaluation.  He denies associated fever, chills, chest pain, or shortness of breath.  He states that he has been having some nausea and vomiting.  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  Patient not very forthcoming with history, playing a game on his phone, and not responding to most questions so history from triage is limited.  Review of Systems  Positive: See HPI Negative: See HPI  Physical Exam  BP 131/86 (BP Location: Right Arm)   Pulse (!) 107   Temp 98.6 F (37 C)   Resp 18   SpO2 97%  Gen:   Awake, no distress   Resp:  Normal effort lungs clear to auscultation MSK:   Right BKA with prosthetic in place Other:  Mild tachycardia already a with regular rhythm, abdomen soft and nontender, patient neurologically intact, pt not fully cooperative with exam and in discussion would like wounds visualized once he reaches bed in main ED  Medical Decision Making  Medically screening exam initiated at 5:08 PM.  Appropriate orders placed.  Ryan [name] was informed that the remainder of the evaluation will be completed by another provider, this initial triage assessment does not replace that evaluation, and the importance of remaining in the ED until their evaluation is complete.     Tonette Lederer, PA-C 02/24/23 1722

## 2023-02-24 NOTE — Progress Notes (Signed)
Pharmacy Antibiotic Note  Ryan [name] is a 57 y.o. male admitted on 02/24/2023 with cellulitis.  Pharmacy has been consulted for vancomycin dosing.  Patient reports erythema to right posterior thigh and right posterior shoulder for the last 2-3 weeks. These wounds have been draining pus as well. Patient also reports developing a wound to his left buttock which has also been draining pus. The patient has worsened pain to the area of his right stump as well.   Plan: - Give vancomycin IV 2g x1 now, followed by vancomycin IV  q12hrs (eAUC 519 using Scr 1.13 and Vd 0.5 as patient's BMI is borderline >30) - Monitor renal function, cultures, and overall clinical picture  - vancomycin levels as needed     Temp (24hrs), Avg:98.6 F (37 C), Min:98.6 F (37 C), Max:98.6 F (37 C)  Recent Labs  Lab 02/24/23 1735  WBC 11.9*  CREATININE 1.13  LATICACIDVEN 3.0*    CrCl cannot be calculated (Unknown ideal weight.).    Allergies  Allergen Reactions   Acetaminophen Nausea And Vomiting    Antimicrobials this admission: 4/16 ceftriaxone 2g x1  4/16 vancomycin >>   Microbiology results: 4/16 BCx: in process  4/16 resp panel: negative     Thank you for allowing pharmacy to be a part of this patient's care.  Cherylin Mylar, PharmD PGY1 Pharmacy Resident 4/16/202410:48 PM

## 2023-02-24 NOTE — Assessment & Plan Note (Signed)
Observation med/surg bed. Serum glucose 648. Improved with IVF and SQ doses of insulin. Will give 30 units of lantus and start SSI. Check A1c.

## 2023-02-24 NOTE — Assessment & Plan Note (Signed)
Chronic. 

## 2023-02-24 NOTE — Assessment & Plan Note (Signed)
Pt refused I&D of abscess unless "Y'all put me to sleep". Discussed that conscious sedation or general anesthesia was not indicated for I&D of simple abscess. Pt continued to refused I&D. Pt advised that abscess/cellulitis would likely not improve unless abscess was drained. Pt continues to refuse I&D.

## 2023-02-24 NOTE — ED Notes (Signed)
Asked NT to fix O2, Pleth is not accurate,

## 2023-02-24 NOTE — Assessment & Plan Note (Signed)
Pt states he has had this wound on his right shoulder for at least 3-4 weeks.Continue with po keflex.  Wound care consult.

## 2023-02-24 NOTE — ED Triage Notes (Signed)
Pt reports boils to R shoulder, R posterior leg, and L glute. Copious amounts of drainage, as clothing is sticking to them. 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

## 2023-02-25 ENCOUNTER — Encounter (HOSPITAL_COMMUNITY): Payer: Self-pay | Admitting: Internal Medicine

## 2023-02-25 ENCOUNTER — Inpatient Hospital Stay (HOSPITAL_COMMUNITY): Payer: 59

## 2023-02-25 DIAGNOSIS — E1169 Type 2 diabetes mellitus with other specified complication: Secondary | ICD-10-CM | POA: Diagnosis present

## 2023-02-25 DIAGNOSIS — Z89511 Acquired absence of right leg below knee: Secondary | ICD-10-CM

## 2023-02-25 DIAGNOSIS — Z79891 Long term (current) use of opiate analgesic: Secondary | ICD-10-CM

## 2023-02-25 DIAGNOSIS — L02411 Cutaneous abscess of right axilla: Secondary | ICD-10-CM | POA: Diagnosis not present

## 2023-02-25 DIAGNOSIS — Z794 Long term (current) use of insulin: Secondary | ICD-10-CM | POA: Diagnosis not present

## 2023-02-25 DIAGNOSIS — G894 Chronic pain syndrome: Secondary | ICD-10-CM | POA: Diagnosis present

## 2023-02-25 DIAGNOSIS — B9562 Methicillin resistant Staphylococcus aureus infection as the cause of diseases classified elsewhere: Secondary | ICD-10-CM | POA: Diagnosis present

## 2023-02-25 DIAGNOSIS — Z7984 Long term (current) use of oral hypoglycemic drugs: Secondary | ICD-10-CM | POA: Diagnosis not present

## 2023-02-25 DIAGNOSIS — T8781 Dehiscence of amputation stump: Secondary | ICD-10-CM | POA: Diagnosis present

## 2023-02-25 DIAGNOSIS — M25511 Pain in right shoulder: Secondary | ICD-10-CM | POA: Diagnosis present

## 2023-02-25 DIAGNOSIS — S41001A Unspecified open wound of right shoulder, initial encounter: Secondary | ICD-10-CM

## 2023-02-25 DIAGNOSIS — Z635 Disruption of family by separation and divorce: Secondary | ICD-10-CM | POA: Diagnosis not present

## 2023-02-25 DIAGNOSIS — E1165 Type 2 diabetes mellitus with hyperglycemia: Secondary | ICD-10-CM

## 2023-02-25 DIAGNOSIS — Z5941 Food insecurity: Secondary | ICD-10-CM | POA: Diagnosis not present

## 2023-02-25 DIAGNOSIS — L02413 Cutaneous abscess of right upper limb: Secondary | ICD-10-CM | POA: Diagnosis present

## 2023-02-25 DIAGNOSIS — K219 Gastro-esophageal reflux disease without esophagitis: Secondary | ICD-10-CM | POA: Diagnosis present

## 2023-02-25 DIAGNOSIS — Z79899 Other long term (current) drug therapy: Secondary | ICD-10-CM | POA: Diagnosis not present

## 2023-02-25 DIAGNOSIS — R9431 Abnormal electrocardiogram [ECG] [EKG]: Secondary | ICD-10-CM | POA: Diagnosis not present

## 2023-02-25 DIAGNOSIS — J45909 Unspecified asthma, uncomplicated: Secondary | ICD-10-CM | POA: Diagnosis present

## 2023-02-25 DIAGNOSIS — L0232 Furuncle of buttock: Secondary | ICD-10-CM | POA: Diagnosis present

## 2023-02-25 DIAGNOSIS — Z91199 Patient's noncompliance with other medical treatment and regimen due to unspecified reason: Secondary | ICD-10-CM | POA: Diagnosis not present

## 2023-02-25 DIAGNOSIS — Z7409 Other reduced mobility: Secondary | ICD-10-CM | POA: Diagnosis present

## 2023-02-25 DIAGNOSIS — L0231 Cutaneous abscess of buttock: Secondary | ICD-10-CM

## 2023-02-25 DIAGNOSIS — Z1152 Encounter for screening for COVID-19: Secondary | ICD-10-CM | POA: Diagnosis not present

## 2023-02-25 DIAGNOSIS — M86261 Subacute osteomyelitis, right tibia and fibula: Secondary | ICD-10-CM | POA: Diagnosis present

## 2023-02-25 DIAGNOSIS — F17210 Nicotine dependence, cigarettes, uncomplicated: Secondary | ICD-10-CM | POA: Diagnosis present

## 2023-02-25 DIAGNOSIS — T8743 Infection of amputation stump, right lower extremity: Secondary | ICD-10-CM | POA: Diagnosis present

## 2023-02-25 DIAGNOSIS — F17200 Nicotine dependence, unspecified, uncomplicated: Secondary | ICD-10-CM

## 2023-02-25 DIAGNOSIS — L03115 Cellulitis of right lower limb: Secondary | ICD-10-CM | POA: Diagnosis present

## 2023-02-25 DIAGNOSIS — Y835 Amputation of limb(s) as the cause of abnormal reaction of the patient, or of later complication, without mention of misadventure at the time of the procedure: Secondary | ICD-10-CM | POA: Diagnosis present

## 2023-02-25 DIAGNOSIS — M7989 Other specified soft tissue disorders: Secondary | ICD-10-CM | POA: Diagnosis not present

## 2023-02-25 LAB — CBC WITH DIFFERENTIAL/PLATELET
Abs Immature Granulocytes: 0.1 10*3/uL — ABNORMAL HIGH (ref 0.00–0.07)
Basophils Absolute: 0.1 10*3/uL (ref 0.0–0.1)
Basophils Relative: 1 %
Eosinophils Absolute: 0.2 10*3/uL (ref 0.0–0.5)
Eosinophils Relative: 1 %
HCT: 32 % — ABNORMAL LOW (ref 39.0–52.0)
Hemoglobin: 10.5 g/dL — ABNORMAL LOW (ref 13.0–17.0)
Immature Granulocytes: 1 %
Lymphocytes Relative: 14 %
Lymphs Abs: 1.8 10*3/uL (ref 0.7–4.0)
MCH: 29.1 pg (ref 26.0–34.0)
MCHC: 32.8 g/dL (ref 30.0–36.0)
MCV: 88.6 fL (ref 80.0–100.0)
Monocytes Absolute: 1.5 10*3/uL — ABNORMAL HIGH (ref 0.1–1.0)
Monocytes Relative: 11 %
Neutro Abs: 9.5 10*3/uL — ABNORMAL HIGH (ref 1.7–7.7)
Neutrophils Relative %: 72 %
Platelets: 360 10*3/uL (ref 150–400)
RBC: 3.61 MIL/uL — ABNORMAL LOW (ref 4.22–5.81)
RDW: 12.7 % (ref 11.5–15.5)
WBC: 13.2 10*3/uL — ABNORMAL HIGH (ref 4.0–10.5)
nRBC: 0 % (ref 0.0–0.2)

## 2023-02-25 LAB — COMPREHENSIVE METABOLIC PANEL
ALT: 12 U/L (ref 0–44)
AST: 13 U/L — ABNORMAL LOW (ref 15–41)
Albumin: 1.8 g/dL — ABNORMAL LOW (ref 3.5–5.0)
Alkaline Phosphatase: 103 U/L (ref 38–126)
Anion gap: 7 (ref 5–15)
BUN: 13 mg/dL (ref 6–20)
CO2: 25 mmol/L (ref 22–32)
Calcium: 8 mg/dL — ABNORMAL LOW (ref 8.9–10.3)
Chloride: 101 mmol/L (ref 98–111)
Creatinine, Ser: 0.87 mg/dL (ref 0.61–1.24)
GFR, Estimated: >60 mL/min (ref >60)
Glucose, Bld: 188 mg/dL — ABNORMAL HIGH (ref 70–99)
Potassium: 3.8 mmol/L (ref 3.5–5.1)
Sodium: 133 mmol/L — ABNORMAL LOW (ref 135–145)
Total Bilirubin: 0.4 mg/dL (ref 0.3–1.2)
Total Protein: 6.1 g/dL — ABNORMAL LOW (ref 6.5–8.1)

## 2023-02-25 LAB — GLUCOSE, CAPILLARY
Glucose-Capillary: 267 mg/dL — ABNORMAL HIGH (ref 70–99)
Glucose-Capillary: 262 mg/dL — ABNORMAL HIGH (ref 70–99)
Glucose-Capillary: 262 mg/dL — ABNORMAL HIGH (ref 70–99)
Glucose-Capillary: 320 mg/dL — ABNORMAL HIGH (ref 70–99)

## 2023-02-25 LAB — HIV ANTIBODY (ROUTINE TESTING W REFLEX): HIV Screen 4th Generation wRfx: NONREACTIVE

## 2023-02-25 LAB — CBG MONITORING, ED: Glucose-Capillary: 316 mg/dL — ABNORMAL HIGH (ref 70–99)

## 2023-02-25 LAB — CULTURE, BLOOD (ROUTINE X 2)

## 2023-02-25 LAB — MAGNESIUM: Magnesium: 1.8 mg/dL (ref 1.7–2.4)

## 2023-02-25 MED ORDER — INSULIN ASPART 100 UNIT/ML IJ SOLN
0.0000 [IU] | Freq: Three times a day (TID) | INTRAMUSCULAR | Status: DC
  Administered 2023-02-25 – 2023-02-26 (×5): 8 [IU] via SUBCUTANEOUS
  Administered 2023-02-26: 11 [IU] via SUBCUTANEOUS
  Administered 2023-02-27: 15 [IU] via SUBCUTANEOUS
  Administered 2023-02-28: 8 [IU] via SUBCUTANEOUS
  Administered 2023-02-28: 15 [IU] via SUBCUTANEOUS
  Administered 2023-03-01 – 2023-03-02 (×2): 3 [IU] via SUBCUTANEOUS
  Administered 2023-03-02: 11 [IU] via SUBCUTANEOUS
  Administered 2023-03-02: 5 [IU] via SUBCUTANEOUS
  Administered 2023-03-03 (×2): 8 [IU] via SUBCUTANEOUS
  Administered 2023-03-04: 3 [IU] via SUBCUTANEOUS
  Administered 2023-03-04 (×2): 11 [IU] via SUBCUTANEOUS
  Administered 2023-03-05: 8 [IU] via SUBCUTANEOUS
  Administered 2023-03-05: 5 [IU] via SUBCUTANEOUS
  Administered 2023-03-05: 11 [IU] via SUBCUTANEOUS
  Administered 2023-03-06: 5 [IU] via SUBCUTANEOUS
  Administered 2023-03-06 (×2): 8 [IU] via SUBCUTANEOUS
  Administered 2023-03-07: 5 [IU] via SUBCUTANEOUS

## 2023-02-25 MED ORDER — ZINC OXIDE 12.8 % EX OINT
TOPICAL_OINTMENT | Freq: Two times a day (BID) | CUTANEOUS | Status: DC
  Filled 2023-02-25: qty 56.7

## 2023-02-25 MED ORDER — ONDANSETRON HCL 4 MG/2ML IJ SOLN: 4.0000 mg | Freq: Four times a day (QID) | INTRAMUSCULAR | Status: DC | PRN

## 2023-02-25 MED ORDER — SODIUM CHLORIDE 0.9 % IV SOLN
2.0000 g | INTRAVENOUS | Status: DC
  Administered 2023-02-25 – 2023-03-02 (×6): 2 g via INTRAVENOUS
  Filled 2023-02-25 (×7): qty 20

## 2023-02-25 MED ORDER — GABAPENTIN 400 MG PO CAPS
800.0000 mg | ORAL_CAPSULE | Freq: Three times a day (TID) | ORAL | Status: DC
  Administered 2023-02-25 – 2023-03-07 (×30): 800 mg via ORAL
  Filled 2023-02-25 (×31): qty 2

## 2023-02-25 MED ORDER — MEDIHONEY WOUND/BURN DRESSING EX PSTE
1.0000 | PASTE | Freq: Every day | CUTANEOUS | Status: DC
  Administered 2023-02-25 – 2023-03-06 (×5): 1 via TOPICAL
  Filled 2023-02-25: qty 44

## 2023-02-25 MED ORDER — CEPHALEXIN 500 MG PO CAPS
500.0000 mg | ORAL_CAPSULE | Freq: Three times a day (TID) | ORAL | Status: DC
  Administered 2023-02-25: 500 mg via ORAL
  Filled 2023-02-25: qty 1

## 2023-02-25 MED ORDER — IOHEXOL 350 MG/ML SOLN
75.0000 mL | Freq: Once | INTRAVENOUS | Status: AC | PRN
  Administered 2023-02-25: 75 mL via INTRAVENOUS

## 2023-02-25 MED ORDER — INSULIN GLARGINE-YFGN 100 UNIT/ML ~~LOC~~ SOLN
20.0000 [IU] | Freq: Every day | SUBCUTANEOUS | Status: DC
  Administered 2023-02-25: 20 [IU] via SUBCUTANEOUS
  Filled 2023-02-25 (×2): qty 0.2

## 2023-02-25 MED ORDER — VANCOMYCIN HCL 1250 MG/250ML IV SOLN
1250.0000 mg | Freq: Two times a day (BID) | INTRAVENOUS | Status: DC
  Administered 2023-02-25 – 2023-03-03 (×12): 1250 mg via INTRAVENOUS
  Filled 2023-02-25 (×13): qty 250

## 2023-02-25 MED ORDER — MEDIHONEY WOUND/BURN DRESSING EX PSTE
1.0000 | PASTE | Freq: Every day | CUTANEOUS | Status: DC
  Administered 2023-03-04 – 2023-03-06 (×3): 1 via TOPICAL
  Filled 2023-02-25: qty 44

## 2023-02-25 MED ORDER — DAKINS (1/4 STRENGTH) 0.125 % EX SOLN
Freq: Two times a day (BID) | CUTANEOUS | Status: DC
  Filled 2023-02-25 (×2): qty 473

## 2023-02-25 MED ORDER — INSULIN ASPART 100 UNIT/ML IJ SOLN
0.0000 [IU] | Freq: Every day | INTRAMUSCULAR | Status: DC
  Administered 2023-02-25 – 2023-02-26 (×3): 4 [IU] via SUBCUTANEOUS
  Administered 2023-02-27: 5 [IU] via SUBCUTANEOUS
  Administered 2023-02-28 – 2023-03-01 (×2): 4 [IU] via SUBCUTANEOUS
  Administered 2023-03-02: 2 [IU] via SUBCUTANEOUS
  Administered 2023-03-03: 5 [IU] via SUBCUTANEOUS
  Administered 2023-03-04: 4 [IU] via SUBCUTANEOUS
  Administered 2023-03-05: 2 [IU] via SUBCUTANEOUS

## 2023-02-25 MED ORDER — INSULIN GLARGINE-YFGN 100 UNIT/ML ~~LOC~~ SOLN
30.0000 [IU] | Freq: Every day | SUBCUTANEOUS | Status: DC
  Administered 2023-02-25: 30 [IU] via SUBCUTANEOUS
  Filled 2023-02-25: qty 0.3

## 2023-02-25 MED ORDER — ONDANSETRON HCL 4 MG PO TABS: 4.0000 mg | ORAL_TABLET | Freq: Four times a day (QID) | ORAL | Status: DC | PRN

## 2023-02-25 MED ORDER — KETOROLAC TROMETHAMINE 15 MG/ML IJ SOLN: 15.0000 mg | Freq: Four times a day (QID) | INTRAMUSCULAR | Status: DC | PRN

## 2023-02-25 MED ORDER — HEPARIN SODIUM (PORCINE) 5000 UNIT/ML IJ SOLN
5000.0000 [IU] | Freq: Three times a day (TID) | INTRAMUSCULAR | Status: DC
  Administered 2023-02-25 – 2023-03-07 (×31): 5000 [IU] via SUBCUTANEOUS
  Filled 2023-02-25 (×31): qty 1

## 2023-02-25 NOTE — Inpatient Diabetes Management (Signed)
Inpatient Diabetes Program Recommendations  AACE/ADA: New Consensus Statement on Inpatient Glycemic Control   Target Ranges:  Prepandial:   less than 140 mg/dL      Peak postprandial:   less than 180 mg/dL (1-2 hours)      Critically ill patients:  140 - 180 mg/dL    Latest Reference Range & Units 02/25/23 01:11 02/25/23 08:39 02/25/23 11:58  Glucose-Capillary 70 - 99 mg/dL 161 (H) 096 (H) 045 (H)    Latest Reference Range & Units 02/24/23 17:35  CO2 22 - 32 mmol/L 26  Glucose 70 - 99 mg/dL 409 (HH)  Calcium 8.9 - 10.3 mg/dL 8.6 (L)  Anion gap 5 - 15  11    Review of Glycemic Control  Diabetes history: DM2 Outpatient Diabetes medications: None listed Current orders for Inpatient glycemic control: Semglee 20 units QHS, Novolog 0-15 units TID, Novolog 0-5 units QHS  Inpatient Diabetes Program Recommendations:    Insulin: Noted patient received Semglee 30 units last night and now ordered Semglee 20 units QHS. Glucose 188-267 mg/dl today.  Recommend to increase Semglee to 35 units QHS and adding Novolog 4 units TID with meals for meal coverage if patient eats at least 50% of meals.  HbgA1C: A1C in process.  NOTE: In reviewing patient's chart, noted H&P notes that patient is not taking DM medications. No DM medications listed on current home medication list. Noted in Care Everywhere that patient seen [name] with [facility] on 10/16/22 and it was noted patient was prescribed Tresiba 24 units daily, Trulicity 4.5 mg Qweek, and Fiasp per sliding scale.  Will wait for A1C results and plan to talk with patient on 02/26/23.   Thanks, Orlando Penner, RN, MSN, CDCES Diabetes Coordinator Inpatient Diabetes Program [phone] (Team Pager from 8am to 5pm)

## 2023-02-25 NOTE — Progress Notes (Addendum)
Pharmacy Antibiotic Note  Ryan [name] is a 57 y.o. male admitted on 02/24/2023 with cellulitis and abscess.  Pharmacy has been consulted for vancomycin dosing.  Patient reports erythema to right posterior thigh and right posterior shoulder for the last 2-3 weeks. These wounds have been draining pus as well. Patient also reports developing a wound to his left buttock which has also been draining pus. The patient has worsened pain to the area of his right stump as well.   Patient received vancomycin 2g IV x1 in the ED 4/16 ~2100. Surgery consulted for possible I&D.  Plan: Vancomycin  IV q12 hours (eAUC 507, Scr 0.87, Vd 0.5) Ceftriaxone 2g IV q24h per MD Follow up culture data, renal function, vanc levels as necessary  Height:  (185.4 cm) Weight: 105.6 kg (232 lb 12.9 oz) IBW/kg (Calculated) : 79.9  Temp (24hrs), Avg:98.3 F (36.8 C), Min:97.9 F (36.6 C), Max:98.6 F (37 C)  Recent Labs  Lab 02/24/23 1735 02/25/23 0429  WBC 11.9* 13.2*  CREATININE 1.13 0.87  LATICACIDVEN 3.0*  --      Estimated Creatinine Clearance: 121 mL/min (by C-G formula based on SCr of 0.87 mg/dL).    Allergies  Allergen Reactions   Acetaminophen Nausea And Vomiting    Antimicrobials this admission: 4/16 ceftriaxone 2g x1  4/16 vancomycin >>   Microbiology results: 4/16 BCx: in process  4/16 resp panel: negative    Thank you for allowing pharmacy to be a part of this patient's care.  Rexford Maus, PharmD, BCPS 02/25/2023 2:21 PM
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
## 2023-02-25 NOTE — Assessment & Plan Note (Addendum)
Continues to smoke 1 pack a day.  Check urine drug screen.
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
## 2023-02-25 NOTE — Consult Note (Addendum)
WOC Nurse Consult Note:  Reason for Consult:  wounds R shoulder, R lateral thigh, R stump  Wound type: unknown etiology, ? Infectious  Pressure Injury POA: NA  Measurement: 1.  R shoulder 5 cms x 2 cms 100% necrotic tissue 2.  R lateral thigh 1 cm x 1 cm x 0.2 cms 75% pink moist 25% yellow necrotic tissue  3.  1 cm x 1 cm erythematous area to stump, patient states painful, skin appears intact   Drainage (amount, consistency, odor)  patient with purulent drainage from L buttock abscess and R shoulder, minimal serosanguinous from R lateral thigh, right stump dry  Periwound: skin surrounding R shoulder erythematous, all other areas intact  Dressing procedure/placement/frequency:  R shoulder clean with NS, apply Dakin's moistened (not saturated) gauze to necrotic wound bed twice a day x 3 days then 02/28/2023 begin Medihoney to wound bed daily.  May cover with dry gauze and ABD pad.  Please apply triple paste cream to erythematous skin surrounding wound to protect skin.  R lateral thigh clean with NS, apply Medihoney to wound bed, fill wound bed with saline moistened gauze, dry gauze and ABD pad or silicone foam. May lift silicone foam to reapply Medihoney. Change foam dressing q3 days and prn soiling.  May apply silicone foam to stump if patient allows.  He states this is where his prosthesis rubs at home.   Patient noted to have a very indurated fluctuant area to L buttock that is actively draining purulent material at this visit.  Patient denies any wound to R buttock.    POC discussed with patient, bedside nurse and MD.    WOC will not follow this patient.    Thank you,    Priscella Mann MSN, RN-BC, [employer] [phone]

## 2023-02-25 NOTE — Progress Notes (Signed)
PROGRESS NOTE  Ryan [name]  DOB: 1966-02-15  PCP: Hortencia Conradi, NP ZOX:[id number]  DOA: 02/24/2023  LOS: 0 days  Hospital Day: 2  Brief narrative: Ryan [name] is a 57 y.o. male with PMH significant for uncontrolled DM2, right leg osteomyelitis s/p right BKA 2022, chronic pain syndrome on chronic opiates, chronic smoker 4/16, patient presented to the ED with complaint of multiple wounds on his right shoulder, right leg, left buttocks Patient seems to be noncompliant to follow ups and medications except for his pain medicines. Few weeks ago, patient noticed a boil on his right shoulder which got worse.  Few days ago, he noticed a similar boil on the right buttock.  He also noticed a sore spot on his right BKA stump that is not draining.  In the ED, afebrile, hemodynamically stable Initial labs with WBC count 11.9, hemoglobin 11.4, lactic acid 3, sodium 127, glucose elevated at 648 CRP 11.2, ESR elevated to 113 Urine ketones negative Right femur x-ray showed below-knee amputation.  No acute osseous lesions.  Admitted to [facility]  Subjective: Patient was seen and examined this a.m.  Middle-aged Caucasian male.  Sitting up at the edge of the bed.  Not in distress.  Participates in conversation only with minimal words. Chart reviewed. Overnight, remains afebrile and hemodynamically stable Repeat labs this morning with sodium 133, WBC count 13.2, hemoglobin 10.5  Assessment and plan: Left buttock abscess Likely has cellulitis and possible underlying abscess of the left buttocks  No evidence of osteomyelitis on imaging.   EDP offered I&D but patient refused unless "Y'all put me to sleep".  I discussed with patient this morning about potential need of I&D.  He agreed to be seen by general surgery.  I will upgrade the antibiotics to IV ceftriaxone and IV vancomycin.  Open wound of right shoulder Pt states he has had this wound on his right shoulder for at least 3-4 weeks. Wound  care consulted.  Antibiotic plan as above.  Right BKA stump lesion Has mild cellulitis.  No evidence of abscess.  Antibiotic plan as above.  Type 2 diabetes mellitus uncontrolled with hyperglycemia A1c pending Not taking any medicines at home He has been started on Lantus 30 units daily with sliding scale insulin Recent Labs  Lab 02/24/23 2224 02/25/23 0111 02/25/23 0839 02/25/23 1158  GLUCAP 478* 316* 267* 262*   Chronic pain syndrome  Chronic opiate use  sees pain clinic in [place], [place]. PTA on Neurontin and oxycodone 15 mg 7 times a day  Tobacco use disorder Continues to smoke 1 pack a day.  Check urine drug screen.    Mobility: Uses prosthesis on the right amputation stump.   Goals of care   Code Status: Full Code     DVT prophylaxis:  heparin injection 5,000 Units Start: 02/25/23 0100 SCDs Start: 02/25/23 0045   Antimicrobials: IV Rocephin, IV vancomycin Fluid: Currently on LR at 150 mill per hour Consultants: General surgery Family Communication: None at bedside  Status: Observation Level of care:  Med-Surg   Needs to continue in-hospital care:  May need I&D of the buttocks wound  Patient from: Home Anticipated d/c to: Pending clinical course   Diet:  Diet Order             Diet Carb Modified Fluid consistency: Thin; Room service appropriate? Yes  Diet effective now                   Scheduled Meds:  gabapentin  800 mg Oral TID   heparin  5,000 Units Subcutaneous Q8H   insulin aspart  0-15 Units Subcutaneous TID WC   insulin aspart  0-5 Units Subcutaneous QHS   insulin glargine-yfgn  20 Units Subcutaneous QHS   leptospermum manuka honey  1 Application Topical Daily   [START ON 02/28/2023] leptospermum manuka honey  1 Application Topical Daily   sodium hypochlorite   Irrigation BID   Zinc Oxide   Topical BID    PRN meds: ketorolac, ondansetron **OR** ondansetron (ZOFRAN) IV, oxyCODONE   Infusions:   cefTRIAXone (ROCEPHIN)  IV      lactated ringers 150 mL/hr at 02/24/23 2028    Antimicrobials: Anti-infectives (From admission, onward)    Start     Dose/Rate Route Frequency Ordered Stop   02/25/23 1430  cefTRIAXone (ROCEPHIN) 2 g in sodium chloride 0.9 % 100 mL IVPB        2 g 200 mL/hr over 30 Minutes Intravenous Every 24 hours 02/25/23 1339     02/25/23 0900  vancomycin (VANCOCIN) IVPB 1000 mg/200 mL premix  Status:  Discontinued        1,000 mg 200 mL/hr over 60 Minutes Intravenous Every 12 hours 02/24/23 2323 02/25/23 0010   02/25/23 0600  cephALEXin (KEFLEX) capsule 500 mg  Status:  Discontinued        500 mg Oral Every 8 hours 02/25/23 0010 02/25/23 1339   02/24/23 1930  vancomycin (VANCOCIN) IVPB 1000 mg/200 mL premix  Status:  Discontinued        1,000 mg 200 mL/hr over 60 Minutes Intravenous  Once 02/24/23 1921 02/24/23 1926   02/24/23 1930  cefTRIAXone (ROCEPHIN) 2 g in sodium chloride 0.9 % 100 mL IVPB        2 g 200 mL/hr over 30 Minutes Intravenous  Once 02/24/23 1921 02/24/23 2058   02/24/23 1930  vancomycin (VANCOREADY) IVPB 2000 mg/400 mL        2,000 mg 200 mL/hr over 120 Minutes Intravenous  Once 02/24/23 1926 02/24/23 2311       Nutritional status:  Body mass index is 30.71 kg/m.          Objective: Vitals:   02/25/23 0223 02/25/23 0752  BP: 112/69 121/70  Pulse: 98 94  Resp: 16 18  Temp: 97.9 F (36.6 C) 98.3 F (36.8 C)  SpO2: 92% 96%    Intake/Output Summary (Last 24 hours) at 02/25/2023 1344 Last data filed at 02/24/2023 2239 Gross per 24 hour  Intake 3500 ml  Output --  Net 3500 ml   Filed Weights   02/24/23 2004  Weight: 105.6 kg   Weight change:  Body mass index is 30.71 kg/m.   Physical Exam: General exam: Middle-aged Caucasian male.   Skin: No rashes, lesions or ulcers. HEENT: Atraumatic, normocephalic, no obvious bleeding Lungs: Clear to auscultation bilaterally CVS: Regular rate and rhythm, no murmur GI/Abd soft, nontender, nondistended, bowel sound  present CNS: Alert, awake, slow to respond, oriented x 3 Psychiatry: Sad affect Extremities: Right BKA stump with small area of redness.  No clear drainage.  Right shoulder area with open wound.  Left buttocks with cellulitis and some oozing.  Data Review: I have personally reviewed the laboratory data and studies available.  F/u labs ordered Unresulted Labs (From admission, onward)     Start     Ordered   02/25/23 0008  Hemoglobin A1c  Add-on,   AD        02/25/23 0007   02/24/23  2347  Rapid urine drug screen (hospital performed)  Add-on,   AD        02/24/23 2346            Total time spent in review of labs and imaging, patient evaluation, formulation of plan, documentation and communication with family: 65 minutes  Signed, Lorin Glass, MD [facility] 02/25/2023
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
## 2023-02-26 ENCOUNTER — Other Ambulatory Visit (HOSPITAL_COMMUNITY): Payer: Self-pay

## 2023-02-26 DIAGNOSIS — E1165 Type 2 diabetes mellitus with hyperglycemia: Secondary | ICD-10-CM | POA: Diagnosis not present

## 2023-02-26 LAB — GLUCOSE, CAPILLARY
Glucose-Capillary: 260 mg/dL — ABNORMAL HIGH (ref 70–99)
Glucose-Capillary: 309 mg/dL — ABNORMAL HIGH (ref 70–99)
Glucose-Capillary: 265 mg/dL — ABNORMAL HIGH (ref 70–99)
Glucose-Capillary: 336 mg/dL — ABNORMAL HIGH (ref 70–99)

## 2023-02-26 LAB — CULTURE, BLOOD (ROUTINE X 2): Special Requests: ADEQUATE

## 2023-02-26 MED ORDER — SODIUM CHLORIDE 0.9 % IV SOLN: INTRAVENOUS | Status: DC

## 2023-02-26 MED ORDER — CEFAZOLIN SODIUM-DEXTROSE 2-4 GM/100ML-% IV SOLN
2.0000 g | INTRAVENOUS | Status: AC
  Administered 2023-02-27: 2 g via INTRAVENOUS
  Filled 2023-02-26: qty 100

## 2023-02-26 MED ORDER — TRANEXAMIC ACID 1000 MG/10ML IV SOLN
2000.0000 mg | INTRAVENOUS | Status: DC
  Filled 2023-02-26: qty 20

## 2023-02-26 MED ORDER — INSULIN ASPART 100 UNIT/ML IJ SOLN
3.0000 [IU] | Freq: Three times a day (TID) | INTRAMUSCULAR | Status: DC
  Administered 2023-02-26 – 2023-02-27 (×4): 3 [IU] via SUBCUTANEOUS

## 2023-02-26 MED ORDER — INSULIN GLARGINE-YFGN 100 UNIT/ML ~~LOC~~ SOLN
35.0000 [IU] | Freq: Every day | SUBCUTANEOUS | Status: DC
Start: 2023-02-26 — End: 2023-02-26

## 2023-02-26 MED ORDER — ZINC OXIDE 12.8 % EX OINT
TOPICAL_OINTMENT | Freq: Two times a day (BID) | CUTANEOUS | Status: DC
  Filled 2023-02-26 (×2): qty 56.7

## 2023-02-26 MED ORDER — TRANEXAMIC ACID-NACL 1000-0.7 MG/100ML-% IV SOLN
1000.0000 mg | INTRAVENOUS | Status: AC
  Administered 2023-02-27: 1000 mg via INTRAVENOUS
  Filled 2023-02-26: qty 100

## 2023-02-26 MED ORDER — CHLORHEXIDINE GLUCONATE 4 % EX SOLN
60.0000 mL | Freq: Once | CUTANEOUS | Status: AC
  Administered 2023-02-27: 4 via TOPICAL
  Filled 2023-02-26: qty 15

## 2023-02-26 MED ORDER — INSULIN GLARGINE-YFGN 100 UNIT/ML ~~LOC~~ SOLN
10.0000 [IU] | Freq: Once | SUBCUTANEOUS | Status: DC
  Filled 2023-02-26: qty 0.1

## 2023-02-26 MED ORDER — POVIDONE-IODINE 10 % EX SWAB
2.0000 | Freq: Once | CUTANEOUS | Status: AC
  Administered 2023-02-27: 2 via TOPICAL

## 2023-02-26 MED ORDER — DAKINS (1/4 STRENGTH) 0.125 % EX SOLN: Freq: Two times a day (BID) | CUTANEOUS | Status: DC

## 2023-02-26 MED ORDER — INSULIN GLARGINE-YFGN 100 UNIT/ML ~~LOC~~ SOLN
30.0000 [IU] | Freq: Every day | SUBCUTANEOUS | Status: DC
  Administered 2023-02-26 – 2023-02-27 (×2): 30 [IU] via SUBCUTANEOUS
  Filled 2023-02-26 (×3): qty 0.3

## 2023-02-26 NOTE — Progress Notes (Addendum)
05:20am -- Discovered patient's R stump/leg is dripping bloody fluid at this time. This is a new discovery since the beginning of shift. Did notice a scabbed like area at the beginning of shift, but patient stated that it did not "drained/leaked" before.   Applied abd pad and tape to the area at this time.

## 2023-02-26 NOTE — TOC Benefit Eligibility Note (Signed)
Patient Product/process development scientist completed.    The patient is currently admitted and upon discharge could be taking Trulicity 0.75 mg/0.5 ml sopn.  The current 30 day co-pay is $0.00.   The patient is currently admitted and upon discharge could be taking Social research officer, government.  The current 30 day co-pay is $0.00.   The patient is currently admitted and upon discharge could be taking Humalog Kwikpen.  The current 30 day co-pay is $0.00.   The patient is currently admitted and upon discharge could be taking Freestyle Libre 3 Sensor  Requires Prior Authorization  The patient is currently admitted and upon discharge could be taking Dexcon G7 Sensor  Requires Prior Authorization  The patient is insured through [employer]   This test claim was processed through [employer]- copay amounts may vary at other pharmacies due to pharmacy/plan contracts, or as the patient moves through the different stages of their insurance plan.  Roland Earl, CPHT Pharmacy Patient Advocate Specialist [facility] Patient Advocate Team Direct Number: [phone]  Fax: [phone]

## 2023-02-26 NOTE — Inpatient Diabetes Management (Addendum)
Inpatient Diabetes Program Recommendations  AACE/ADA: New Consensus Statement on Inpatient Glycemic Control  Target Ranges:  Prepandial:   less than 140 mg/dL      Peak postprandial:   less than 180 mg/dL (1-2 hours)      Critically ill patients:  140 - 180 mg/dL    Latest Reference Range & Units 02/25/23 08:39 02/25/23 11:58 02/25/23 15:53 02/25/23 19:59 02/26/23 08:13  Glucose-Capillary 70 - 99 mg/dL 161 (H) 096 (H) 045 (H) 320 (H) 260 (H)    Latest Reference Range & Units 02/24/23 17:35  Glucose 70 - 99 mg/dL 409 (HH)   Review of Glycemic Control  Diabetes history: DM2 Outpatient Diabetes medications: None listed Current orders for Inpatient glycemic control: Semglee 30 units QHS, Novolog 0-15 units TID, Novolog 0-5 units QHS, Novolog 3 units TID with meals, Semglee 10 units x1 at 8:45 am   Inpatient Diabetes Program Recommendations:     Insulin: Patient received Semglee 20 units on 02/25/23 and fasting CBG 260 mg/dl today. Noted Semglee increased to 30 units to start at bedtime today, one time order for Semglee 10 units this morning, and meal coverage insulin added.   HbgA1C: A1C in process.   Outpatient DM medications: Per Ryan [name], CPhT with outpatient [facility] pharmacy: Ryan [name], and Humalog and they are all $0 copay. FreeStyle Libre3 and Dexcom G7 both require prior authorization.  Please provide Rx for Trulicity, Tresiba, Humalog Flexpens, and insulin pen needles. If provider wants to give approval for prior authorization, could prescribe Dexcom G7 or FreeStyle Libre3 sensors as well.  NOTE: In reviewing patient's chart, noted H&P notes that patient is not taking DM medications. No DM medications listed on current home medication list. Noted in Care Everywhere that patient seen Ryan [name] with [facility] on 10/16/22 and it was noted patient was prescribed Tresiba 24 units daily, Trulicity 4.5 mg Qweek, and Fiasp per sliding scale.  Will  plan to talk with patient today.  Addendum 02/26/23@12 :50-Spoke with patient at bedside regarding diabetes and home regimen for diabetes control. Patient reports being followed by PCP for diabetes management and notes that he is not taking any DM medications. Patient reports that he can not afford his DM medications. Patient reports he has not had DM medications in "a while"; had been on Metformin combo medication, Trulicity and insulin. Patient did not know the name of the insulins. Informed patient that per note by Ryan [name] on 10/16/22,  Ryan [name] and Fiasp insulin was prescribed and patient stated "well I guess that is the insulin I am on then."  Inquired about cost of medications with current insurance. Patient states he has not tried to get the medications since he got new insurance started in January 2024.  Patient reports that he does not check his sugars because he does not like to do finger sticks.  Informed patient that an A1C was ordered but is still in process but anticipate it will be elevated since initial glucose was 648 mg/dl. Discussed glucose and A1C goals. Discussed importance of checking CBGs and maintaining good CBG control to prevent long-term and short-term complications. Explained how hyperglycemia leads to damage within blood vessels which lead to the common complications seen with uncontrolled diabetes. Stressed to the patient the importance of improving glycemic control to prevent further complications from uncontrolled diabetes. Discussed current ordered insulin regimen. Patient states he would prefer to stay on Trulcity, Tresiba, and [name] outpatient.  Informed patient I would ask outpatient TOC  to do benefit check to see how much medications will cost but explained that depending on insurance, may need to change to different insulins/meds.  Patient states that he tried to get a continuous glucose monitoring sensor in the past but he was never able to get it approved. Informed patient  that I would ask [facility] outpatient pharmacy to see if Dexcom G7 or FreeStyle Libre3 would be covered or not. Stressed to patient that DM needed to be under better control to decrease risk of further complications.  Patient verbalized understanding of information discussed and reports no further questions at this time related to diabetes. Per Ryan [name], CPhT with outpatient [facility] pharmacy, patient's insurance covers [place], Ryan [name], and Humalog and they are all $0 copay. FreeStyle Libre3 and Dexcom G7 both require prior authorization.   Thanks, Ryan Penner, RN, MSN, CDCES Diabetes Coordinator Inpatient Diabetes Program [phone] (Team Pager from 8am to 5pm)

## 2023-02-26 NOTE — Progress Notes (Signed)
Patient ID: Ryan [name], male   DOB: [birth year], 57 y.o.   MRN: [id number] I will see the patient.  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  I will need to coordinate this with general surgery.

## 2023-02-26 NOTE — Progress Notes (Signed)
Progress Note     Subjective: Complains of new drainage from BKA stump. Pain and drainage from right shoulder and left buttock seem unchanged   Objective: Vital signs in last 24 hours: Temp:  [97.6 F (36.4 C)-98.6 F (37 C)] 98 F (36.7 C) (04/18 0811) Pulse Rate:  [82-104] 82 (04/18 0811) Resp:  [18-20] 18 (04/18 0811) BP: (132-147)/(71-101) 145/72 (04/18 0811) SpO2:  [96 %-100 %] 100 % (04/18 0811) Last BM Date : 02/24/23  Intake/Output from previous day: 04/17 0701 - 04/18 0700 In: 606.4 [IV Piggyback:606.4] Out: 375 [Urine:375] Intake/Output this shift: No intake/output data recorded.  PE: General: pleasant, WD, male who is laying in bed in NAD Lungs: Respiratory effort nonlabored Skin:  R back/shoulder wound with moderate bloody purulent drainage on bandage. There is significant erythema and induration with possible fluctuance around central area of necrotic tissue L buttock abscess with moderate purulent drainage on dressing. Continued erythema and fluctuance Psych: A&Ox3 with an appropriate affect.    Lab Results:  Recent Labs    02/24/23 1735 02/25/23 0429  WBC 11.9* 13.2*  HGB 11.4* 10.5*  HCT 35.1* 32.0*  PLT 396 360   BMET Recent Labs    02/24/23 1735 02/25/23 0429  NA 127* 133*  K 4.3 3.8  CL 90* 101  CO2 26 25  GLUCOSE 648* 188*  BUN 17 13  CREATININE 1.13 0.87  CALCIUM 8.6* 8.0*   PT/INR No results for input(s): "LABPROT", "INR" in the last 72 hours. CMP     Component Value Date/Time   NA 133 (L) 02/25/2023 0429   K 3.8 02/25/2023 0429   CL 101 02/25/2023 0429   CO2 25 02/25/2023 0429   GLUCOSE 188 (H) 02/25/2023 0429   BUN 13 02/25/2023 0429   CREATININE 0.87 02/25/2023 0429   CALCIUM 8.0 (L) 02/25/2023 0429   PROT 6.1 (L) 02/25/2023 0429   ALBUMIN 1.8 (L) 02/25/2023 0429   AST 13 (L) 02/25/2023 0429   ALT 12 02/25/2023 0429   ALKPHOS 103 02/25/2023 0429   BILITOT 0.4 02/25/2023 0429   GFRNONAA >60 02/25/2023 0429    GFRAA >90 04/02/2012 0948   Lipase  No results found for: "LIPASE"     Studies/Results: CT KNEE RIGHT W CONTRAST  Result Date: 02/26/2023 CLINICAL DATA:  Below the knee amputation with amputation site wound. Evaluate for underlying abscess or osteomyelitis. EXAM: CT OF THE RIGHT KNEE WITH CONTRAST TECHNIQUE: Multidetector CT imaging was performed following the standard protocol during bolus administration of intravenous contrast. RADIATION DOSE REDUCTION: This exam was performed according to the departmental dose-optimization program which includes automated exposure control, adjustment of the mA and/or kV according to patient size and/or use of iterative reconstruction technique. CONTRAST:  75mL OMNIPAQUE IOHEXOL 350 MG/ML SOLN COMPARISON:  Radiographs 02/24/2023. FINDINGS: Bones/Joint/Cartilage Status post below the knee amputation. Along the lateral aspect of the tibial stump, there is an ill-defined osseous erosion measuring approximately 9 mm on coronal image 110/6. The amputation margins of the distal tibia and fibula are otherwise smooth without additional areas of bone destruction. The bones are demineralized. The distal femur and patella are unremarkable in appearance. There is a small nonspecific knee joint effusion. Ligaments Suboptimally assessed by CT. The cruciate ligaments are grossly intact. Muscles and Tendons The quadriceps and patellar tendons are intact. Mild atrophy within the lower leg musculature. No focal intramuscular fluid collections or suspicious enhancement identified. Soft tissues There are several ill-defined low-density collections within the soft tissues along the anterolateral  aspect of the distal tibial stump. There is a distal component measuring up to 2.3 cm on coronal image 103/8. Anterolaterally, there is a component measuring up to 2.5 cm on image 115/5. There is mild peripheral enhancement of these collections which are not well defined. No associated foreign body  or soft tissue emphysema. No proximal fluid collections are identified. There is generalized subcutaneous edema throughout the remaining lower leg and visualized distal thigh. Femoral atherosclerosis noted. IMPRESSION: 1. Ill-defined low-density collections within the soft tissues along the anterolateral aspect of the distal tibial stump, suspicious for small abscesses. 2. Small ill-defined osseous erosion along the lateral aspect of the tibial stump suspicious for osteomyelitis. No other suspicious osseous findings post below the knee amputation. 3. Generalized subcutaneous edema throughout the remaining lower leg and visualized distal thigh. 4. Nonspecific small knee joint effusion. Electronically Signed   By: Carey Bullocks M.D.   On: 02/26/2023 08:02   CT Chest W Contrast  Result Date: 02/24/2023 CLINICAL DATA:  Chest wall pain EXAM: CT CHEST WITH CONTRAST TECHNIQUE: Multidetector CT imaging of the chest was performed during intravenous contrast administration. RADIATION DOSE REDUCTION: This exam was performed according to the departmental dose-optimization program which includes automated exposure control, adjustment of the mA and/or kV according to patient size and/or use of iterative reconstruction technique. CONTRAST:  50mL OMNIPAQUE IOHEXOL 350 MG/ML SOLN COMPARISON:  CT abdomen 05/03/2009 and previous FINDINGS: Cardiovascular: Heart size upper limits normal. Chronic anterior pericardial calcification without effusion. Scattered left coronary calcifications. Atheromatous descending thoracic aorta without aneurysm. Mediastinum/Nodes: No mass or adenopathy. Lungs/Pleura: No pleural effusion. No pneumothorax. Minimal dependent atelectasis at the left lung base. Lungs otherwise clear. Upper Abdomen: No acute findings. Stable left upper pole renal cyst; no follow-up recommended. Musculoskeletal: Cervical fixation hardware partially visualized. Bilateral sternoclavicular DJD. Sternotomy wires. Vertebral  endplate spurring at multiple levels in the lower thoracic spine. IMPRESSION: 1. No acute findings. 2. Coronary and Aortic Atherosclerosis (ICD10-170.0). Electronically Signed   By: Corlis Leak M.D.   On: 02/24/2023 20:37   DG Chest 1 View  Result Date: 02/24/2023 CLINICAL DATA:  Right leg stump pain EXAM: CHEST  1 VIEW COMPARISON:  07/09/2022 FINDINGS: Lungs are essentially clear. Left infrahilar opacity, likely atelectasis. No pleural effusion or pneumothorax. The heart is normal in size. Median sternotomy. IMPRESSION: No acute cardiopulmonary disease. Electronically Signed   By: Charline Bills M.D.   On: 02/24/2023 20:21   DG Knee 2 Views Right  Result Date: 02/24/2023 CLINICAL DATA:  Stump pain of right leg below-knee EXAM: RIGHT KNEE - 1-2 VIEW; RIGHT FEMUR 2 VIEWS; PELVIS - 1-2 VIEW COMPARISON:  None Available. FINDINGS: No acute fracture or dislocation in the pelvis. Degenerative changes pubic symphysis, both hips, SI joints and lower lumbar spine. No acute fracture of the right femur. Right below-the-knee amputation. No acute fracture or dislocation of the right knee. No effusion. No evidence of osteomyelitis about the stump. Soft tissues are unremarkable. Vascular calcifications. The lateral view is limited due to external material projecting over the knee. IMPRESSION: 1. No acute fracture or dislocation in the pelvis, right femur, or knee. 2. Right below-the-knee amputation.  No acute osseous abnormality. Electronically Signed   By: Minerva Fester M.D.   On: 02/24/2023 20:20   DG Femur Min 2 Views Right  Result Date: 02/24/2023 CLINICAL DATA:  Stump pain of right leg below-knee EXAM: RIGHT KNEE - 1-2 VIEW; RIGHT FEMUR 2 VIEWS; PELVIS - 1-2 VIEW COMPARISON:  None Available. FINDINGS: No  acute fracture or dislocation in the pelvis. Degenerative changes pubic symphysis, both hips, SI joints and lower lumbar spine. No acute fracture of the right femur. Right below-the-knee amputation. No acute  fracture or dislocation of the right knee. No effusion. No evidence of osteomyelitis about the stump. Soft tissues are unremarkable. Vascular calcifications. The lateral view is limited due to external material projecting over the knee. IMPRESSION: 1. No acute fracture or dislocation in the pelvis, right femur, or knee. 2. Right below-the-knee amputation.  No acute osseous abnormality. Electronically Signed   By: Minerva Fester M.D.   On: 02/24/2023 20:20   DG Pelvis 1-2 Views  Result Date: 02/24/2023 CLINICAL DATA:  Stump pain of right leg below-knee EXAM: RIGHT KNEE - 1-2 VIEW; RIGHT FEMUR 2 VIEWS; PELVIS - 1-2 VIEW COMPARISON:  None Available. FINDINGS: No acute fracture or dislocation in the pelvis. Degenerative changes pubic symphysis, both hips, SI joints and lower lumbar spine. No acute fracture of the right femur. Right below-the-knee amputation. No acute fracture or dislocation of the right knee. No effusion. No evidence of osteomyelitis about the stump. Soft tissues are unremarkable. Vascular calcifications. The lateral view is limited due to external material projecting over the knee. IMPRESSION: 1. No acute fracture or dislocation in the pelvis, right femur, or knee. 2. Right below-the-knee amputation.  No acute osseous abnormality. Electronically Signed   By: Minerva Fester M.D.   On: 02/24/2023 20:20    Anti-infectives: Anti-infectives (From admission, onward)    Start     Dose/Rate Route Frequency Ordered Stop   02/25/23 1530  vancomycin (VANCOREADY) IVPB 1250 mg/250 mL        1,250 mg 166.7 mL/hr over 90 Minutes Intravenous Every 12 hours 02/25/23 1430     02/25/23 1430  cefTRIAXone (ROCEPHIN) 2 g in sodium chloride 0.9 % 100 mL IVPB        2 g 200 mL/hr over 30 Minutes Intravenous Every 24 hours 02/25/23 1339     02/25/23 0900  vancomycin (VANCOCIN) IVPB 1000 mg/200 mL premix  Status:  Discontinued        1,000 mg 200 mL/hr over 60 Minutes Intravenous Every 12 hours 02/24/23 2323  02/25/23 0010   02/25/23 0600  cephALEXin (KEFLEX) capsule 500 mg  Status:  Discontinued        500 mg Oral Every 8 hours 02/25/23 0010 02/25/23 1339   02/24/23 1930  vancomycin (VANCOCIN) IVPB 1000 mg/200 mL premix  Status:  Discontinued        1,000 mg 200 mL/hr over 60 Minutes Intravenous  Once 02/24/23 1921 02/24/23 1926   02/24/23 1930  cefTRIAXone (ROCEPHIN) 2 g in sodium chloride 0.9 % 100 mL IVPB        2 g 200 mL/hr over 30 Minutes Intravenous  Once 02/24/23 1921 02/24/23 2058   02/24/23 1930  vancomycin (VANCOREADY) IVPB 2000 mg/400 mL        2,000 mg 200 mL/hr over 120 Minutes Intravenous  Once 02/24/23 1926 02/24/23 2311        Assessment/Plan Left Buttock abscess Right upper back/shoulder wound   - right shoulder wound assessed - cont abx and will likely benefit from incision and debridement in the or - left buttock wound now draining. Continued cellulitis. Will plan for OR incision and drainage  Discussed with orthopedics who are caring for BKA stump and will try to coordinate OR time for tomorrow    FEN: Carb mod, NPO MN ID: ceftriaxone/vancomycin VTE: heparin subq  Per primary T2DM Chronic pain Tobacco use  I reviewed hospitalist notes, last 24 h vitals and pain scores, last 48 h intake and output, last 24 h labs and trends, last 24 h imaging results, and discussed with orthopedic surgery .    LOS: 1 day   Eric Form, [facility] Surgery 02/26/2023, 9:22 AM Please see Amion for pager number during day hours 7:00am-4:30pm

## 2023-02-26 NOTE — Progress Notes (Signed)
PROGRESS NOTE  Ryan [name]  DOB: [birth year]  PCP: Hortencia Conradi, NP ZOX:[id number]  DOA: 02/24/2023  LOS: 1 day  Hospital Day: 3  Brief narrative: Ryan [name] is a 57 y.o. male with PMH significant for uncontrolled DM2, right leg osteomyelitis s/p right BKA 2022, chronic pain syndrome on chronic opiates, chronic smoker 4/16, patient presented to the ED with complaint of multiple wounds on his right shoulder, right leg, left buttocks Patient seems to be noncompliant to follow ups and medications except for his pain medicines. Few weeks ago, patient noticed a boil on his right shoulder which got worse.  Few days ago, he noticed a similar boil on the right buttock.  He also noticed a sore spot on his right BKA stump that is not draining.  In the ED, afebrile, hemodynamically stable Initial labs with WBC count 11.9, hemoglobin 11.4, lactic acid 3, sodium 127, glucose elevated at 648 CRP 11.2, ESR elevated to 113 Urine ketones negative Right femur x-ray showed below-knee amputation.  No acute osseous lesions.  Admitted to [facility]  Subjective: Patient was seen and examined this a.m.  Sitting up at the edge of the bed.  Not in distress.  Seen by general surgery and orthopedics. Noted a plan of one-stage surgery with Ortho and general surgery tomorrow. N.p.o. after midnight  Assessment and plan: Left buttock abscess Right upper back/shoulder wound Likely has cellulitis with drainage on left buttock as well as right upper back/shoulder area. No evidence of osteomyelitis on imaging.   General surgery consult obtained.  Plan for I&D in OR tomorrow.   Currently on IV Rocephin and IV vancomycin  Right BKA stump cellulitis/drainage Started oozing out some this morning.  Orthopedics consulted.  CT right knee obtained.  May have underlying abscess. OR tomorrow  Type 2 diabetes mellitus uncontrolled with hyperglycemia A1c pending Not taking any medicines at home He has been started  on Lantus 30 units daily with sliding scale insulin.  Added Premeal scheduled short acting insulin. No results found for: "HGBA1C"  Recent Labs  Lab 02/25/23 0839 02/25/23 1158 02/25/23 1553 02/25/23 1959 02/26/23 0813  GLUCAP 267* 262* 262* 320* 260*   Chronic pain syndrome  Chronic opiate use  sees pain clinic in [place], [place]. PTA on Neurontin and oxycodone 15 mg 7 times a day Same resumed.  Tobacco use disorder Continues to smoke 1 pack a day.  Check urine drug screen.    Mobility: Was using prosthesis on the right amputation stump.   Goals of care   Code Status: Full Code     DVT prophylaxis:  heparin injection 5,000 Units Start: 02/25/23 0100 SCDs Start: 02/25/23 0045   Antimicrobials: IV Rocephin, IV vancomycin Fluid: NS at 75 mill per hour Consultants: General surgery, orthopedics Family Communication: None at bedside  Status: Inpatient Level of care:  Med-Surg   Needs to continue in-hospital care:  OR tomorrow  Patient from: Home Anticipated d/c to: Pending clinical course   Diet:  Diet Order             Diet NPO time specified Except for: Sips with Meds  Diet effective midnight           Diet Carb Modified Fluid consistency: Thin; Room service appropriate? Yes  Diet effective now                   Scheduled Meds:  gabapentin  800 mg Oral TID   heparin  5,000 Units Subcutaneous Q8H  insulin aspart  0-15 Units Subcutaneous TID WC   insulin aspart  0-5 Units Subcutaneous QHS   insulin aspart  3 Units Subcutaneous TID WC   insulin glargine-yfgn  10 Units Subcutaneous Once   insulin glargine-yfgn  30 Units Subcutaneous QHS   leptospermum manuka honey  1 Application Topical Daily   [START ON 02/28/2023] leptospermum manuka honey  1 Application Topical Daily   sodium hypochlorite   Irrigation BID   Zinc Oxide   Topical BID    PRN meds: ondansetron **OR** ondansetron (ZOFRAN) IV, oxyCODONE   Infusions:   sodium chloride      cefTRIAXone (ROCEPHIN)  IV 2 g (02/25/23 1440)   vancomycin 1,250 mg (02/26/23 0247)    Antimicrobials: Anti-infectives (From admission, onward)    Start     Dose/Rate Route Frequency Ordered Stop   02/25/23 1530  vancomycin (VANCOREADY) IVPB 1250 mg/250 mL        1,250 mg 166.7 mL/hr over 90 Minutes Intravenous Every 12 hours 02/25/23 1430     02/25/23 1430  cefTRIAXone (ROCEPHIN) 2 g in sodium chloride 0.9 % 100 mL IVPB        2 g 200 mL/hr over 30 Minutes Intravenous Every 24 hours 02/25/23 1339     02/25/23 0900  vancomycin (VANCOCIN) IVPB 1000 mg/200 mL premix  Status:  Discontinued        1,000 mg 200 mL/hr over 60 Minutes Intravenous Every 12 hours 02/24/23 2323 02/25/23 0010   02/25/23 0600  cephALEXin (KEFLEX) capsule 500 mg  Status:  Discontinued        500 mg Oral Every 8 hours 02/25/23 0010 02/25/23 1339   02/24/23 1930  vancomycin (VANCOCIN) IVPB 1000 mg/200 mL premix  Status:  Discontinued        1,000 mg 200 mL/hr over 60 Minutes Intravenous  Once 02/24/23 1921 02/24/23 1926   02/24/23 1930  cefTRIAXone (ROCEPHIN) 2 g in sodium chloride 0.9 % 100 mL IVPB        2 g 200 mL/hr over 30 Minutes Intravenous  Once 02/24/23 1921 02/24/23 2058   02/24/23 1930  vancomycin (VANCOREADY) IVPB 2000 mg/400 mL        2,000 mg 200 mL/hr over 120 Minutes Intravenous  Once 02/24/23 1926 02/24/23 2311       Nutritional status:  Body mass index is 30.71 kg/m.          Objective: Vitals:   02/26/23 0244 02/26/23 0811  BP: 132/71 (!) 145/72  Pulse: 90 82  Resp: 18 18  Temp: 98.6 F (37 C) 98 F (36.7 C)  SpO2: 96% 100%    Intake/Output Summary (Last 24 hours) at 02/26/2023 1109 Last data filed at 02/26/2023 1007 Gross per 24 hour  Intake 606.41 ml  Output 675 ml  Net -68.59 ml   Filed Weights   02/24/23 2004  Weight: 105.6 kg   Weight change:  Body mass index is 30.71 kg/m.   Physical Exam: General exam: Middle-aged Caucasian male.  Pain controlled Skin:  No rashes, lesions or ulcers. HEENT: Atraumatic, normocephalic, no obvious bleeding Lungs: Clear to auscultation bilaterally CVS: Regular rate and rhythm, no murmur GI/Abd soft, nontender, nondistended, bowel sound present CNS: Alert, awake, slow to respond, oriented x 3 Psychiatry: Mood appropriate.  Not so conversational. Extremities: Right BKA stump with small area of redness.  No clear drainage.  Right shoulder area with open wound.  Left buttocks with cellulitis and some oozing.  Data Review: I have personally  reviewed the laboratory data and studies available.  F/u labs ordered Unresulted Labs (From admission, onward)     Start     Ordered   02/27/23 0500  CBC  Tomorrow morning,   R       Question:  Specimen collection method  Answer:  Lab=Lab collect   02/26/23 0923   02/25/23 0008  Hemoglobin A1c  Add-on,   AD        02/25/23 0007   02/24/23 2347  Rapid urine drug screen (hospital performed)  Add-on,   AD        02/24/23 2346            Total time spent in review of labs and imaging, patient evaluation, formulation of plan, documentation and communication with family: 45 minutes  Signed, Lorin Glass, MD [facility] 02/26/2023

## 2023-02-27 ENCOUNTER — Encounter (HOSPITAL_COMMUNITY)
Admission: EM | Disposition: A | Discharge status: Skilled Nursing Facility | Payer: Self-pay | Source: Home / Self Care | Attending: Internal Medicine

## 2023-02-27 ENCOUNTER — Encounter (HOSPITAL_COMMUNITY): Payer: Self-pay | Admitting: Internal Medicine

## 2023-02-27 ENCOUNTER — Inpatient Hospital Stay (HOSPITAL_COMMUNITY): Payer: 59 | Admitting: Certified Registered Nurse Anesthetist

## 2023-02-27 DIAGNOSIS — T8781 Dehiscence of amputation stump: Secondary | ICD-10-CM

## 2023-02-27 DIAGNOSIS — J45909 Unspecified asthma, uncomplicated: Secondary | ICD-10-CM

## 2023-02-27 DIAGNOSIS — Z89511 Acquired absence of right leg below knee: Secondary | ICD-10-CM | POA: Diagnosis not present

## 2023-02-27 DIAGNOSIS — L0231 Cutaneous abscess of buttock: Secondary | ICD-10-CM | POA: Diagnosis not present

## 2023-02-27 DIAGNOSIS — M86261 Subacute osteomyelitis, right tibia and fibula: Secondary | ICD-10-CM

## 2023-02-27 DIAGNOSIS — E1165 Type 2 diabetes mellitus with hyperglycemia: Secondary | ICD-10-CM

## 2023-02-27 DIAGNOSIS — L02411 Cutaneous abscess of right axilla: Secondary | ICD-10-CM

## 2023-02-27 DIAGNOSIS — Z7984 Long term (current) use of oral hypoglycemic drugs: Secondary | ICD-10-CM

## 2023-02-27 DIAGNOSIS — F17210 Nicotine dependence, cigarettes, uncomplicated: Secondary | ICD-10-CM

## 2023-02-27 HISTORY — DX: Dehiscence of amputation stump: T87.81

## 2023-02-27 HISTORY — DX: Subacute osteomyelitis, right tibia and fibula: M86.261

## 2023-02-27 HISTORY — PX: INCISION AND DRAINAGE OF WOUND: SHX1803

## 2023-02-27 HISTORY — PX: PILONIDAL CYST DRAINAGE: SHX743

## 2023-02-27 HISTORY — PX: STUMP REVISION: SHX6102

## 2023-02-27 LAB — GLUCOSE, CAPILLARY
Glucose-Capillary: 152 mg/dL — ABNORMAL HIGH (ref 70–99)
Glucose-Capillary: 127 mg/dL — ABNORMAL HIGH (ref 70–99)
Glucose-Capillary: 133 mg/dL — ABNORMAL HIGH (ref 70–99)
Glucose-Capillary: 154 mg/dL — ABNORMAL HIGH (ref 70–99)
Glucose-Capillary: 352 mg/dL — ABNORMAL HIGH (ref 70–99)
Glucose-Capillary: 366 mg/dL — ABNORMAL HIGH (ref 70–99)

## 2023-02-27 LAB — CBC
HCT: 34.2 % — ABNORMAL LOW (ref 39.0–52.0)
Hemoglobin: 11.3 g/dL — ABNORMAL LOW (ref 13.0–17.0)
MCH: 29.4 pg (ref 26.0–34.0)
MCHC: 33 g/dL (ref 30.0–36.0)
MCV: 89.1 fL (ref 80.0–100.0)
Platelets: 382 10*3/uL (ref 150–400)
RBC: 3.84 MIL/uL — ABNORMAL LOW (ref 4.22–5.81)
RDW: 13 % (ref 11.5–15.5)
WBC: 9.4 10*3/uL (ref 4.0–10.5)
nRBC: 0 % (ref 0.0–0.2)

## 2023-02-27 LAB — AEROBIC/ANAEROBIC CULTURE W GRAM STAIN (SURGICAL/DEEP WOUND)

## 2023-02-27 LAB — CULTURE, BLOOD (ROUTINE X 2): Culture: NO GROWTH

## 2023-02-27 LAB — HEMOGLOBIN A1C
Hgb A1c MFr Bld: >15.5 % — ABNORMAL HIGH (ref 4.8–5.6)
Mean Plasma Glucose: >398 mg/dL

## 2023-02-27 SURGERY — REVISION, AMPUTATION SITE
Anesthesia: General | Site: Shoulder | Laterality: Right

## 2023-02-27 MED ORDER — GUAIFENESIN-DM 100-10 MG/5ML PO SYRP: 15.0000 mL | ORAL_SOLUTION | ORAL | Status: DC | PRN

## 2023-02-27 MED ORDER — HYDROMORPHONE HCL 1 MG/ML IJ SOLN
INTRAMUSCULAR | Status: AC
  Filled 2023-02-27: qty 1

## 2023-02-27 MED ORDER — VANCOMYCIN HCL 1000 MG IV SOLR
INTRAVENOUS | Status: DC | PRN
  Administered 2023-02-27: 1000 mg

## 2023-02-27 MED ORDER — LIDOCAINE 2% (20 MG/ML) 5 ML SYRINGE
INTRAMUSCULAR | Status: DC | PRN
  Administered 2023-02-27: 100 mg via INTRAVENOUS

## 2023-02-27 MED ORDER — MAGNESIUM CITRATE PO SOLN: 1.0000 | Freq: Once | ORAL | Status: DC | PRN

## 2023-02-27 MED ORDER — SODIUM CHLORIDE 0.9 % IV SOLN: INTRAVENOUS | Status: DC

## 2023-02-27 MED ORDER — OXYCODONE HCL 5 MG PO TABS
5.0000 mg | ORAL_TABLET | Freq: Once | ORAL | Status: AC | PRN
  Administered 2023-02-27: 5 mg via ORAL

## 2023-02-27 MED ORDER — HYDROMORPHONE HCL 1 MG/ML IJ SOLN
0.5000 mg | INTRAMUSCULAR | Status: DC | PRN
  Administered 2023-02-28 – 2023-03-07 (×12): 1 mg via INTRAVENOUS
  Filled 2023-02-27 (×13): qty 1

## 2023-02-27 MED ORDER — POTASSIUM CHLORIDE CRYS ER 20 MEQ PO TBCR: 20.0000 meq | EXTENDED_RELEASE_TABLET | Freq: Every day | ORAL | Status: DC | PRN

## 2023-02-27 MED ORDER — LABETALOL HCL 5 MG/ML IV SOLN: 10.0000 mg | INTRAVENOUS | Status: DC | PRN

## 2023-02-27 MED ORDER — ROCURONIUM BROMIDE 10 MG/ML (PF) SYRINGE
PREFILLED_SYRINGE | INTRAVENOUS | Status: AC
  Filled 2023-02-27: qty 10

## 2023-02-27 MED ORDER — PHENOL 1.4 % MT LIQD: 1.0000 | OROMUCOSAL | Status: DC | PRN

## 2023-02-27 MED ORDER — OXYCODONE HCL 5 MG PO TABS
5.0000 mg | ORAL_TABLET | ORAL | Status: DC | PRN
  Filled 2023-02-27: qty 2

## 2023-02-27 MED ORDER — 0.9 % SODIUM CHLORIDE (POUR BTL) OPTIME
TOPICAL | Status: DC | PRN
  Administered 2023-02-27: 1000 mL

## 2023-02-27 MED ORDER — VITAMIN C 500 MG PO TABS
1000.0000 mg | ORAL_TABLET | Freq: Every day | ORAL | Status: DC
  Administered 2023-02-27 – 2023-03-07 (×9): 1000 mg via ORAL
  Filled 2023-02-27 (×9): qty 2

## 2023-02-27 MED ORDER — VANCOMYCIN HCL 1000 MG IV SOLR
INTRAVENOUS | Status: AC
  Filled 2023-02-27: qty 20

## 2023-02-27 MED ORDER — FENTANYL CITRATE (PF) 100 MCG/2ML IJ SOLN
INTRAMUSCULAR | Status: AC
  Administered 2023-02-27: 25 ug via INTRAVENOUS
  Filled 2023-02-27: qty 2

## 2023-02-27 MED ORDER — MAGNESIUM SULFATE 2 GM/50ML IV SOLN: 2.0000 g | Freq: Every day | INTRAVENOUS | Status: DC | PRN

## 2023-02-27 MED ORDER — PANTOPRAZOLE SODIUM 40 MG PO TBEC
40.0000 mg | DELAYED_RELEASE_TABLET | Freq: Every day | ORAL | Status: DC
  Administered 2023-02-27 – 2023-03-07 (×9): 40 mg via ORAL
  Filled 2023-02-27 (×9): qty 1

## 2023-02-27 MED ORDER — CEFAZOLIN SODIUM-DEXTROSE 2-4 GM/100ML-% IV SOLN
2.0000 g | Freq: Three times a day (TID) | INTRAVENOUS | Status: DC
  Filled 2023-02-27: qty 100

## 2023-02-27 MED ORDER — PHENYLEPHRINE HCL-NACL 20-0.9 MG/250ML-% IV SOLN
INTRAVENOUS | Status: DC | PRN
  Administered 2023-02-27: 25 ug/min via INTRAVENOUS

## 2023-02-27 MED ORDER — FENTANYL CITRATE (PF) 250 MCG/5ML IJ SOLN
INTRAMUSCULAR | Status: AC
  Filled 2023-02-27: qty 5

## 2023-02-27 MED ORDER — ZINC SULFATE 220 (50 ZN) MG PO CAPS
220.0000 mg | ORAL_CAPSULE | Freq: Every day | ORAL | Status: DC
  Administered 2023-02-27 – 2023-03-07 (×9): 220 mg via ORAL
  Filled 2023-02-27 (×9): qty 1

## 2023-02-27 MED ORDER — OXYCODONE HCL 5 MG/5ML PO SOLN: 5.0000 mg | Freq: Once | ORAL | Status: AC | PRN

## 2023-02-27 MED ORDER — POLYETHYLENE GLYCOL 3350 17 G PO PACK: 17.0000 g | PACK | Freq: Every day | ORAL | Status: DC | PRN

## 2023-02-27 MED ORDER — METOPROLOL TARTRATE 5 MG/5ML IV SOLN: 2.0000 mg | INTRAVENOUS | Status: DC | PRN

## 2023-02-27 MED ORDER — OXYCODONE HCL 5 MG PO TABS
10.0000 mg | ORAL_TABLET | ORAL | Status: DC | PRN
  Administered 2023-03-01: 15 mg via ORAL
  Filled 2023-02-27 (×2): qty 3

## 2023-02-27 MED ORDER — BISACODYL 5 MG PO TBEC: 5.0000 mg | DELAYED_RELEASE_TABLET | Freq: Every day | ORAL | Status: DC | PRN

## 2023-02-27 MED ORDER — HYDROMORPHONE HCL 1 MG/ML IJ SOLN
0.2500 mg | INTRAMUSCULAR | Status: DC | PRN
  Administered 2023-02-27 (×3): 0.5 mg via INTRAVENOUS

## 2023-02-27 MED ORDER — PROMETHAZINE HCL 25 MG/ML IJ SOLN: 6.2500 mg | INTRAMUSCULAR | Status: DC | PRN

## 2023-02-27 MED ORDER — BUPIVACAINE-EPINEPHRINE (PF) 0.5% -1:200000 IJ SOLN
INTRAMUSCULAR | Status: AC
  Filled 2023-02-27: qty 30

## 2023-02-27 MED ORDER — ORAL CARE MOUTH RINSE: 15.0000 mL | Freq: Once | OROMUCOSAL | Status: AC

## 2023-02-27 MED ORDER — OXYCODONE HCL 5 MG PO TABS
ORAL_TABLET | ORAL | Status: AC
  Filled 2023-02-27: qty 1

## 2023-02-27 MED ORDER — MIDAZOLAM HCL 2 MG/2ML IJ SOLN
INTRAMUSCULAR | Status: AC
  Filled 2023-02-27: qty 2

## 2023-02-27 MED ORDER — PHENYLEPHRINE 80 MCG/ML (10ML) SYRINGE FOR IV PUSH (FOR BLOOD PRESSURE SUPPORT)
PREFILLED_SYRINGE | INTRAVENOUS | Status: DC | PRN
  Administered 2023-02-27: 80 ug via INTRAVENOUS
  Administered 2023-02-27: 160 ug via INTRAVENOUS
  Administered 2023-02-27: 240 ug via INTRAVENOUS
  Administered 2023-02-27 (×3): 160 ug via INTRAVENOUS

## 2023-02-27 MED ORDER — FENTANYL CITRATE (PF) 100 MCG/2ML IJ SOLN: 25.0000 ug | Freq: Once | INTRAMUSCULAR | Status: AC

## 2023-02-27 MED ORDER — MIDAZOLAM HCL 5 MG/5ML IJ SOLN
INTRAMUSCULAR | Status: DC | PRN
  Administered 2023-02-27: 2 mg via INTRAVENOUS

## 2023-02-27 MED ORDER — ROCURONIUM BROMIDE 10 MG/ML (PF) SYRINGE
PREFILLED_SYRINGE | INTRAVENOUS | Status: DC | PRN
  Administered 2023-02-27: 100 mg via INTRAVENOUS

## 2023-02-27 MED ORDER — SUGAMMADEX SODIUM 200 MG/2ML IV SOLN
INTRAVENOUS | Status: DC | PRN
  Administered 2023-02-27: 200 mg via INTRAVENOUS

## 2023-02-27 MED ORDER — LACTATED RINGERS IV SOLN: INTRAVENOUS | Status: DC

## 2023-02-27 MED ORDER — FENTANYL CITRATE (PF) 250 MCG/5ML IJ SOLN
INTRAMUSCULAR | Status: DC | PRN
  Administered 2023-02-27: 50 ug via INTRAVENOUS
  Administered 2023-02-27: 100 ug via INTRAVENOUS
  Administered 2023-02-27 (×2): 50 ug via INTRAVENOUS

## 2023-02-27 MED ORDER — AMISULPRIDE (ANTIEMETIC) 5 MG/2ML IV SOLN: 10.0000 mg | Freq: Once | INTRAVENOUS | Status: DC | PRN

## 2023-02-27 MED ORDER — PROPOFOL 10 MG/ML IV BOLUS
INTRAVENOUS | Status: DC | PRN
  Administered 2023-02-27: 150 mg via INTRAVENOUS

## 2023-02-27 MED ORDER — LIDOCAINE 2% (20 MG/ML) 5 ML SYRINGE
INTRAMUSCULAR | Status: AC
  Filled 2023-02-27: qty 5

## 2023-02-27 MED ORDER — JUVEN PO PACK
1.0000 | PACK | Freq: Two times a day (BID) | ORAL | Status: DC
  Administered 2023-02-27 – 2023-03-07 (×14): 1 via ORAL
  Filled 2023-02-27 (×14): qty 1

## 2023-02-27 MED ORDER — ONDANSETRON HCL 4 MG/2ML IJ SOLN: 4.0000 mg | Freq: Four times a day (QID) | INTRAMUSCULAR | Status: DC | PRN

## 2023-02-27 MED ORDER — ONDANSETRON HCL 4 MG/2ML IJ SOLN
INTRAMUSCULAR | Status: DC | PRN
  Administered 2023-02-27: 4 mg via INTRAVENOUS

## 2023-02-27 MED ORDER — HYDRALAZINE HCL 20 MG/ML IJ SOLN: 5.0000 mg | INTRAMUSCULAR | Status: DC | PRN

## 2023-02-27 MED ORDER — DEXAMETHASONE SODIUM PHOSPHATE 10 MG/ML IJ SOLN
INTRAMUSCULAR | Status: DC | PRN
  Administered 2023-02-27: 4 mg via INTRAVENOUS

## 2023-02-27 MED ORDER — PHENYLEPHRINE 80 MCG/ML (10ML) SYRINGE FOR IV PUSH (FOR BLOOD PRESSURE SUPPORT)
PREFILLED_SYRINGE | INTRAVENOUS | Status: AC
  Filled 2023-02-27: qty 10

## 2023-02-27 MED ORDER — DOCUSATE SODIUM 100 MG PO CAPS
100.0000 mg | ORAL_CAPSULE | Freq: Every day | ORAL | Status: DC
  Administered 2023-02-28 – 2023-03-07 (×8): 100 mg via ORAL
  Filled 2023-02-27 (×8): qty 1

## 2023-02-27 MED ORDER — CHLORHEXIDINE GLUCONATE 0.12 % MT SOLN
15.0000 mL | Freq: Once | OROMUCOSAL | Status: AC
  Administered 2023-02-27: 15 mL via OROMUCOSAL

## 2023-02-27 MED ORDER — PROPOFOL 10 MG/ML IV BOLUS
INTRAVENOUS | Status: AC
  Filled 2023-02-27: qty 20

## 2023-02-27 MED ORDER — ALUM & MAG HYDROXIDE-SIMETH 200-200-20 MG/5ML PO SUSP: 15.0000 mL | ORAL | Status: DC | PRN

## 2023-02-27 MED ORDER — ACETAMINOPHEN 325 MG PO TABS: 325.0000 mg | ORAL_TABLET | Freq: Four times a day (QID) | ORAL | Status: DC | PRN

## 2023-02-27 SURGICAL SUPPLY — 66 items
BAG COUNTER SPONGE SURGICOUNT (BAG) ×8 IMPLANT
BAG SPNG CNTER NS LX DISP (BAG) ×8
BLADE CLIPPER SURG (BLADE) IMPLANT
BLADE SAW RECIP 87.9 MT (BLADE) ×1 IMPLANT
BLADE SURG 21 STRL SS (BLADE) ×4 IMPLANT
BNDG CMPR 75X21 PLY HI ABS (MISCELLANEOUS) ×4
BNDG GAUZE DERMACEA FLUFF 4 (GAUZE/BANDAGES/DRESSINGS) IMPLANT
BNDG GZE DERMACEA 4 6PLY (GAUZE/BANDAGES/DRESSINGS)
CANISTER SUCT 3000ML PPV (MISCELLANEOUS) ×4 IMPLANT
CANISTER WOUND CARE 500ML ATS (WOUND CARE) ×4 IMPLANT
COVER SURGICAL LIGHT HANDLE (MISCELLANEOUS) ×8 IMPLANT
DRAPE DERMATAC (DRAPES) ×1 IMPLANT
DRAPE EXTREMITY T 121X128X90 (DISPOSABLE) ×4 IMPLANT
DRAPE HALF SHEET 40X57 (DRAPES) ×4 IMPLANT
DRAPE INCISE IOBAN 66X45 STRL (DRAPES) ×5 IMPLANT
DRAPE LAPAROSCOPIC ABDOMINAL (DRAPES) IMPLANT
DRAPE LAPAROTOMY 100X72 PEDS (DRAPES) IMPLANT
DRAPE LAPAROTOMY T 102X78X121 (DRAPES) ×4 IMPLANT
DRAPE U-SHAPE 47X51 STRL (DRAPES) ×8 IMPLANT
DRESSING PEEL AND PLAC PRVNA20 (GAUZE/BANDAGES/DRESSINGS) ×1 IMPLANT
DRESSING PREVENA PLUS CUSTOM (GAUZE/BANDAGES/DRESSINGS) ×4 IMPLANT
DRSG PEEL AND PLACE PREVENA 20 (GAUZE/BANDAGES/DRESSINGS) ×4
DRSG PREVENA PLUS CUSTOM (GAUZE/BANDAGES/DRESSINGS) ×4
DURAPREP 26ML APPLICATOR (WOUND CARE) ×4 IMPLANT
ELECT REM PT RETURN 9FT ADLT (ELECTROSURGICAL) ×8
ELECTRODE REM PT RTRN 9FT ADLT (ELECTROSURGICAL) ×8 IMPLANT
GAUZE PACKING FOLDED 2  STR (GAUZE/BANDAGES/DRESSINGS) ×8
GAUZE PACKING FOLDED 2 STR (GAUZE/BANDAGES/DRESSINGS) ×2 IMPLANT
GAUZE PAD ABD 8X10 STRL (GAUZE/BANDAGES/DRESSINGS) IMPLANT
GAUZE SPONGE 4X4 12PLY STRL (GAUZE/BANDAGES/DRESSINGS) ×5 IMPLANT
GAUZE STRETCH 2X75IN STRL (MISCELLANEOUS) ×1 IMPLANT
GLOVE BIOGEL PI IND STRL 8 (GLOVE) ×4 IMPLANT
GLOVE BIOGEL PI IND STRL 9 (GLOVE) ×4 IMPLANT
GLOVE ECLIPSE 8.0 STRL XLNG CF (GLOVE) ×4 IMPLANT
GLOVE SURG ORTHO 9.0 STRL STRW (GLOVE) ×4 IMPLANT
GLOVE SURG SIGNA 7.5 PF LTX (GLOVE) ×8 IMPLANT
GOWN STRL REUS W/ TWL LRG LVL3 (GOWN DISPOSABLE) ×8 IMPLANT
GOWN STRL REUS W/ TWL XL LVL3 (GOWN DISPOSABLE) ×12 IMPLANT
GOWN STRL REUS W/TWL LRG LVL3 (GOWN DISPOSABLE) ×8
GOWN STRL REUS W/TWL XL LVL3 (GOWN DISPOSABLE) ×12
GRAFT SKIN WND MICRO 38 (Tissue) ×1 IMPLANT
GRAFT SKIN WND OMEGA3 SB 7X10 (Tissue) ×1 IMPLANT
KIT BASIN OR (CUSTOM PROCEDURE TRAY) ×8 IMPLANT
KIT DRSG PREVENA PLUS 7DAY 125 (MISCELLANEOUS) ×1 IMPLANT
KIT TURNOVER KIT B (KITS) ×8 IMPLANT
MANIFOLD NEPTUNE II (INSTRUMENTS) ×4 IMPLANT
NDL HYPO 25GX1X1/2 BEV (NEEDLE) IMPLANT
NEEDLE HYPO 25GX1X1/2 BEV (NEEDLE) IMPLANT
NS IRRIG 1000ML POUR BTL (IV SOLUTION) ×8 IMPLANT
PACK GENERAL/GYN (CUSTOM PROCEDURE TRAY) ×8 IMPLANT
PAD ABD 8X10 STRL (GAUZE/BANDAGES/DRESSINGS) ×2 IMPLANT
PAD ARMBOARD 7.5X6 YLW CONV (MISCELLANEOUS) ×12 IMPLANT
PENCIL SMOKE EVACUATOR (MISCELLANEOUS) ×4 IMPLANT
PREVENA RESTOR ARTHOFORM 46X30 (CANNISTER) ×4 IMPLANT
SPECIMEN JAR SMALL (MISCELLANEOUS) IMPLANT
STAPLER VISISTAT 35W (STAPLE) IMPLANT
SUT ETHILON 2 0 PSLX (SUTURE) ×8 IMPLANT
SUT SILK 2 0 (SUTURE)
SUT SILK 2-0 18XBRD TIE 12 (SUTURE) IMPLANT
SUT VIC AB 1 CTX 27 (SUTURE) ×1 IMPLANT
SWAB COLLECTION DEVICE MRSA (MISCELLANEOUS) IMPLANT
SWAB CULTURE ESWAB REG 1ML (MISCELLANEOUS) IMPLANT
TAPE CLOTH SURG 4X10 WHT LF (GAUZE/BANDAGES/DRESSINGS) ×2 IMPLANT
TOWEL GREEN STERILE (TOWEL DISPOSABLE) ×8 IMPLANT
TOWEL GREEN STERILE FF (TOWEL DISPOSABLE) ×4 IMPLANT
UNDERPAD 30X36 HEAVY ABSORB (UNDERPADS AND DIAPERS) ×4 IMPLANT

## 2023-02-27 NOTE — Op Note (Signed)
ote  Ryan [name] 02/24/2023 - 02/27/2023   Pre-op Diagnosis: right posterior shoulder abscess, left buttock abscess     Post-op Diagnosis: same  Procedure(s): Incision, drainage, sharp debridement right posterior shoulder and left buttock abscess  Surgeon: Dr. Abigail Miyamoto  Anesthesia: General  Staff:  Circulator: Lennox Laity, RN Relief Scrub: Jeronimo Greaves, RN  Estimated Blood Loss: Minimal  Indications: This is a noncompliant 57 year old gentleman who is a diabetic who presents with a necrotic abscess on his right upper back as well as a smaller abscess on his left buttock.  He will also be going to the operating room for debridement or revision of his right BKA by orthopedics               Procedure: The patient brought to the operating identified as correct patient.  He is placed upon the operating table and general anesthesia was induced.  His right posterior shoulder was then prepped and draped in usual sterile fashion.  There was a small draining chole on the top of the shoulder on the right side.  I made a small incision over this area with a scalpel.  There is mild amount of purulence this area.  There is a necrotic area of skin measuring 4 x 6 cm on his right upper back near the shoulder.  I excised the necrotic area which was only skin and underlying fat with the cautery.  Purulence was then irrigated from the wound.  This did communicate with the area on the shoulder.  I achieved hemostasis in both areas with the cautery.  I irrigated and thoroughly saline.  I then packed each wound with a 2 inch Kling.  This was covered with 4 x 4's and ABDs and tape. We then bumped his left side up and I was able to see the area of the abscess on the buttock.  The area was prepped and draped.  I made an elliptical incision over the abscess to remove some the skin with a scalpel and entered a superficial abscess cavity.  We again irrigated with saline and evacuated all  purulence.  We again then packed this wound with a 2 inch Kling as well.  This was covered with 4 x 4's and tape.  The patient tolerated these 2 procedures well.  All counts were correct at the end of my part of the procedure. The patient remained intubated for Dr. Lajoyce Corners to then begin the evaluation of the right BKA stump          Abigail Miyamoto   Date: 02/27/2023  Time: 11:02 AM

## 2023-02-27 NOTE — Anesthesia Postprocedure Evaluation (Signed)
Anesthesia Post Note  Patient: Ryan [name]  Procedure(s) Performed: REVISION RIGHT BELOW KNEE AMPUTATION (Right: Knee) IRRIGATION AND DEBRIDEMENT LEFT BUTTOCK (Left: Buttocks) IRRIGATION AND DEBRIDEMENT SHOULDER  WOUND (Right: Shoulder)     Patient location during evaluation: PACU Anesthesia Type: General Level of consciousness: awake and alert Pain management: pain level controlled Vital Signs Assessment: post-procedure vital signs reviewed and stable Respiratory status: spontaneous breathing, nonlabored ventilation and respiratory function stable Cardiovascular status: blood pressure returned to baseline and stable Postop Assessment: no apparent nausea or vomiting Anesthetic complications: no   No notable events documented.  Last Vitals:  Vitals:   02/27/23 1245 02/27/23 1300  BP: (!) 142/69 (!) 133/90  Pulse: 87 87  Resp: 16 12  Temp:  36.5 C  SpO2: 97% 95%    Last Pain:  Vitals:   02/27/23 1245  TempSrc:   PainSc: Asleep                 Lowella Curb

## 2023-02-27 NOTE — Op Note (Signed)
02/27/2023  12:14 PM  PATIENT:  Ryan [name]    PRE-OPERATIVE DIAGNOSIS:  Dehiscence Right Below Knee Amputation, Shoulder and Buttock wounds  POST-OPERATIVE DIAGNOSIS:  Same  PROCEDURE:  REVISION RIGHT BELOW KNEE AMPUTATION Application of Kerecis micro graft 38 cm and Kerecis sheet 7 x 10 cm. Application of customizable wound VAC. Application of vancomycin powder 1 g packed within the distal tibia. Tissue and bone sent for separate cultures.  SURGEON:  Nadara Mustard, MD  PHYSICIAN ASSISTANT:None ANESTHESIA:   General  PREOPERATIVE INDICATIONS:  Ryan [name] is a  57 y.o. male with a diagnosis of Dehiscence Right Below Knee Amputation, Shoulder and Buttock wounds who failed conservative measures and elected for surgical management.    The risks benefits and alternatives were discussed with the patient preoperatively including but not limited to the risks of infection, bleeding, nerve injury, cardiopulmonary complications, the need for revision surgery, among others, and the patient was willing to proceed.  OPERATIVE IMPLANTS:   Implant Name Type Inv. Item Serial No. Manufacturer Lot No. LRB No. Used Action  GRAFT SKIN WND MICRO 38 - [id number] Tissue GRAFT SKIN WND MICRO 38  [employer] [id number] Right 1 Implanted  GRAFT SKIN WND OMEGA3 SB 7X10 - [id number] Tissue GRAFT SKIN WND OMEGA3 SB 7X10  [employer] [id number] Right 1 Implanted    @  OPERATIVE FINDINGS: There is no gastroc or soleus muscles to advance over the distal tibia.  Used tissue graft instead.  Bone was packed with 1 g vancomycin powder.  Tissue and bone sent for separate cultures.  There was purulent drainage.  OPERATIVE PROCEDURE: Patient was brought the operating room and underwent general anesthetic.  After adequate levels anesthesia obtained patient underwent general surgery procedure for multiple ulcers.  After completion of the general surgery case the orthopedic case was started.   The right lower extremity was prepped using DuraPrep draped into a sterile field a timeout was called.  A fishmouth incision was made around the ulcerative tissue this was carried down to bone.  There was purulent abscess within the soft tissue and this was sent for cultures.  The distal centimeter of the tibia was resected and beveled anteriorly the distal fibula 1 cm was resected.  The tibia was curetted and the tibia was packed with vancomycin powder 1 g.  The tissue margins were clear the wound was irrigated with normal saline.  Electrocautery was used hemostasis.  The Kerecis micro powder was applied to the wound bed followed by the [facility] sheet 7 x 10 cm.  There was insufficient gastroc or soleus muscles to pull over the end of the bone.  2-0 nylon was used for wound closure.  This was covered with a Prevena customizable wound VAC this had a good suction fit patient was extubated taken the PACU in stable condition.   DISCHARGE PLANNING:  Antibiotic duration: Recommend continue antibiotics based on tissue cultures.  Weightbearing: Nonweightbearing on the right  Pain medication: Opioid pathway  Dressing care/ Wound VAC: Continue wound VAC for 1 week  Ambulatory devices: Walker  Discharge to: Discharge planning based on therapy recommendations.  Follow-up: In the office 1 week post operative.

## 2023-02-27 NOTE — Care Management Important Message (Signed)
Important Message  Patient Details  Name: Ryan [name] MRN: [id number] Date of Birth: 09/05/1966   Medicare Important Message Given:  Yes     Hakiem Malizia 02/27/2023, 1:16 PM

## 2023-02-27 NOTE — Progress Notes (Signed)
PROGRESS NOTE  Ryan [name]  DOB: 09-Jan-1966  PCP: Hortencia Conradi, NP ZOX:[id number]  DOA: 02/24/2023  LOS: 2 days  Hospital Day: 4  Brief narrative: Ryan [name] is a 57 y.o. male with PMH significant for uncontrolled DM2, right leg osteomyelitis s/p right BKA 2022, chronic pain syndrome on chronic opiates, chronic smoker 4/16, patient presented to the ED with complaint of multiple wounds on his right shoulder, right leg, left buttocks Patient seems to be noncompliant to follow ups and medications except for his pain medicines. Few weeks ago, patient noticed a boil on his right shoulder which got worse.  Few days ago, he noticed a similar boil on the right buttock.  He also noticed a sore spot on his right BKA stump that is not draining.  In the ED, afebrile, hemodynamically stable Initial labs with WBC count 11.9, hemoglobin 11.4, lactic acid 3, sodium 127, glucose elevated at 648 CRP 11.2, ESR elevated to 113 Urine ketones negative Right femur x-ray showed below-knee amputation.  No acute osseous lesions.  Admitted to [facility]  Subjective: Patient was seen and examined this afternoon. Sitting up in bed.  In pain after surgery.  Assessment and plan: Left buttock abscess Right upper back/shoulder wound General surgery consult obtained.   Underwent I&D and sharp debridement of the right posterior shoulder and left buttock abscess. Currently on IV Rocephin and IV vancomycin.  Culture sent from the OR.  Right BKA stump cellulitis/drainage 4/19, underwent right BKA revision by Dr. Lajoyce Corners.  Type 2 diabetes mellitus uncontrolled with hyperglycemia A1c uncontrolled, more than 15 Not taking any medicines at home Currently on Lantus 30 units daily, scheduled and sliding scale regular insulin.   Recent Labs  Lab 02/26/23 2002 02/27/23 0811 02/27/23 1009 02/27/23 1200 02/27/23 1344  GLUCAP 336* 152* 127* 133* 154*    Chronic pain syndrome  Chronic opiate use  sees pain  clinic in [place], [place]. PTA on Neurontin and oxycodone 15 mg 7 times a day Same resumed. Also on as needed IV Dilaudid for severe pain.  Tobacco use disorder Continues to smoke 1 pack a day.     Mobility: Was using prosthesis on the right amputation stump.  PT eval ordered  Goals of care   Code Status: Full Code     DVT prophylaxis:  SCD's Start: 02/27/23 1342 heparin injection 5,000 Units Start: 02/25/23 0100 SCDs Start: 02/25/23 0045   Antimicrobials: IV Rocephin, IV vancomycin Fluid: NS at 75 mill per hour Consultants: General surgery, orthopedics Family Communication: None at bedside  Status: Inpatient Level of care:  Med-Surg   Needs to continue in-hospital care:  OR today.  Pending culture report  Patient from: Home Anticipated d/c to: Pending clinical course.    Diet:  Diet Order             Diet Carb Modified Fluid consistency: Thin; Room service appropriate? Yes  Diet effective now                   Scheduled Meds:  vitamin C  1,000 mg Oral Daily   [START ON 02/28/2023] docusate sodium  100 mg Oral Daily   gabapentin  800 mg Oral TID   heparin  5,000 Units Subcutaneous Q8H   HYDROmorphone       HYDROmorphone       insulin aspart  0-15 Units Subcutaneous TID WC   insulin aspart  0-5 Units Subcutaneous QHS   insulin aspart  3 Units Subcutaneous TID WC  insulin glargine-yfgn  10 Units Subcutaneous Once   insulin glargine-yfgn  30 Units Subcutaneous QHS   leptospermum manuka honey  1 Application Topical Daily   [START ON 02/28/2023] leptospermum manuka honey  1 Application Topical Daily   nutrition supplement (JUVEN)  1 packet Oral BID BM   oxyCODONE       pantoprazole  40 mg Oral Daily   sodium hypochlorite   Irrigation BID   Zinc Oxide   Topical BID   zinc sulfate  220 mg Oral Daily    PRN meds: [START ON 02/28/2023] acetaminophen, alum & mag hydroxide-simeth, bisacodyl, guaiFENesin-dextromethorphan, hydrALAZINE, HYDROmorphone,  HYDROmorphone, HYDROmorphone (DILAUDID) injection, labetalol, magnesium citrate, magnesium sulfate bolus IVPB, metoprolol tartrate, ondansetron, ondansetron **OR** [DISCONTINUED] ondansetron (ZOFRAN) IV, oxyCODONE, oxyCODONE, oxyCODONE, oxyCODONE, phenol, polyethylene glycol, potassium chloride   Infusions:   sodium chloride 75 mL/hr at 02/27/23 0251   sodium chloride 75 mL/hr at 02/27/23 1352   cefTRIAXone (ROCEPHIN)  IV 2 g (02/27/23 1354)   magnesium sulfate bolus IVPB     vancomycin 1,250 mg (02/27/23 2956)    Antimicrobials: Anti-infectives (From admission, onward)    Start     Dose/Rate Route Frequency Ordered Stop   02/27/23 1430  ceFAZolin (ANCEF) IVPB 2g/100 mL premix  Status:  Discontinued        2 g 200 mL/hr over 30 Minutes Intravenous Every 8 hours 02/27/23 1341 02/27/23 1416   02/27/23 1130  ceFAZolin (ANCEF) IVPB 2g/100 mL premix        2 g 200 mL/hr over 30 Minutes Intravenous On call to O.R. 02/26/23 1840 02/27/23 1049   02/27/23 1128  vancomycin (VANCOCIN) powder  Status:  Discontinued          As needed 02/27/23 1148 02/27/23 1155   02/25/23 1530  vancomycin (VANCOREADY) IVPB 1250 mg/250 mL        1,250 mg 166.7 mL/hr over 90 Minutes Intravenous Every 12 hours 02/25/23 1430     02/25/23 1430  cefTRIAXone (ROCEPHIN) 2 g in sodium chloride 0.9 % 100 mL IVPB        2 g 200 mL/hr over 30 Minutes Intravenous Every 24 hours 02/25/23 1339     02/25/23 0900  vancomycin (VANCOCIN) IVPB 1000 mg/200 mL premix  Status:  Discontinued        1,000 mg 200 mL/hr over 60 Minutes Intravenous Every 12 hours 02/24/23 2323 02/25/23 0010   02/25/23 0600  cephALEXin (KEFLEX) capsule 500 mg  Status:  Discontinued        500 mg Oral Every 8 hours 02/25/23 0010 02/25/23 1339   02/24/23 1930  vancomycin (VANCOCIN) IVPB 1000 mg/200 mL premix  Status:  Discontinued        1,000 mg 200 mL/hr over 60 Minutes Intravenous  Once 02/24/23 1921 02/24/23 1926   02/24/23 1930  cefTRIAXone (ROCEPHIN)  2 g in sodium chloride 0.9 % 100 mL IVPB        2 g 200 mL/hr over 30 Minutes Intravenous  Once 02/24/23 1921 02/24/23 2058   02/24/23 1930  vancomycin (VANCOREADY) IVPB 2000 mg/400 mL        2,000 mg 200 mL/hr over 120 Minutes Intravenous  Once 02/24/23 1926 02/24/23 2311       Nutritional status:  Body mass index is 30.54 kg/m.          Objective: Vitals:   02/27/23 1245 02/27/23 1300  BP: (!) 142/69 (!) 133/90  Pulse: 87 87  Resp: 16 12  Temp:  97.7 F (36.5 C)  SpO2: 97% 95%    Intake/Output Summary (Last 24 hours) at 02/27/2023 1419 Last data filed at 02/27/2023 1300 Gross per 24 hour  Intake 3230.11 ml  Output 1375 ml  Net 1855.11 ml    Filed Weights   02/24/23 2004 02/27/23 0835  Weight: 105.6 kg 105 kg   Weight change:  Body mass index is 30.54 kg/m.   Physical Exam: General exam: Middle-aged Caucasian male.  Pain partially controlled. Skin: No rashes, lesions or ulcers. HEENT: Atraumatic, normocephalic, no obvious bleeding Lungs: Clear to auscultation bilaterally CVS: Regular rate and rhythm, no murmur GI/Abd soft, nontender, nondistended, bowel sound present CNS: Alert, awake, slow to respond, oriented x 3 Psychiatry: Mood appropriate.  Not so conversational. Extremities: Right BKA revision with bandage to stump. Right shoulder area with postsurgical status.  Data Review: I have personally reviewed the laboratory data and studies available.  F/u labs ordered Unresulted Labs (From admission, onward)     Start     Ordered   02/27/23 1125  Aerobic/Anaerobic Culture w Gram Stain (surgical/deep wound)  RELEASE UPON ORDERING,   TIMED       Comments: Specimen A: Phone [phone] Immunocompromised?  No  Antibiotic Treatment:  vancomycin/rocephin Is the patient on airborne/droplet precautions? No Clinical History:   Special Instructions:   Specimen Disposition:  Microbiology     02/27/23 1125   02/27/23 1125  Aerobic/Anaerobic Culture w Gram  Stain (surgical/deep wound)  RELEASE UPON ORDERING,   TIMED       Comments: Specimen B: Phone [phone] Immunocompromised?  No  Antibiotic Treatment:  vancomycin/rocephin Is the patient on airborne/droplet precautions? No Clinical History:   Special Instructions:   Specimen Disposition:  Microbiology     02/27/23 1125   02/24/23 2347  Rapid urine drug screen (hospital performed)  Add-on,   AD        02/24/23 2346            Total time spent in review of labs and imaging, patient evaluation, formulation of plan, documentation and communication with family: 45 minutes  Signed, Lorin Glass, MD [facility] 02/27/2023

## 2023-02-27 NOTE — Progress Notes (Signed)
Patient ID: Ryan [name], male   DOB: 02-13-1966, 57 y.o.   MRN: [id number]   Pre Procedure note for inpatients:   Ryan [name] has been scheduled for Procedure(s): REVISION RIGHT BELOW KNEE AMPUTATION (Right) Debridement Right Shoulder (Right) IRRIGATION AND DEBRIDEMENT LEFT BUTTOCK (Left) today. The various methods of treatment have been discussed with the patient. After consideration of the risks, benefits and treatment options the patient has consented to the planned procedure.   The patient has been seen and labs reviewed. There are no changes in the patient's condition to prevent proceeding with the planned procedure today.  Recent labs:  Lab Results  Component Value Date   WBC 9.4 02/27/2023   HGB 11.3 (L) 02/27/2023   HCT 34.2 (L) 02/27/2023   PLT 382 02/27/2023   GLUCOSE 188 (H) 02/25/2023   ALT 12 02/25/2023   AST 13 (L) 02/25/2023   NA 133 (L) 02/25/2023   K 3.8 02/25/2023   CL 101 02/25/2023   CREATININE 0.87 02/25/2023   BUN 13 02/25/2023   CO2 25 02/25/2023   INR 0.98 12/05/2010    Abigail Miyamoto, MD 02/27/2023 9:32 AM
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
## 2023-02-27 NOTE — Anesthesia Preprocedure Evaluation (Signed)
Anesthesia Evaluation  Patient identified by MRN, date of birth, ID band Patient awake    Reviewed: Allergy & Precautions, H&P , NPO status , Patient's Chart, lab work & pertinent test results, reviewed documented beta blocker date and time   Airway Mallampati: I  TM Distance: >3 FB Neck ROM: Full    Dental  (+) Poor Dentition, Missing   Pulmonary asthma , sleep apnea and Continuous Positive Airway Pressure Ventilation , Current Smoker and Patient abstained from smoking.   Pulmonary exam normal breath sounds clear to auscultation       Cardiovascular Normal cardiovascular exam Rhythm:Regular Rate:Normal     Neuro/Psych    GI/Hepatic ,GERD  ,,  Endo/Other  diabetes, Poorly Controlled, Oral Hypoglycemic Agents    Renal/GU      Musculoskeletal  (+) Arthritis , Osteoarthritis,    Abdominal   Peds  Hematology   Anesthesia Other Findings   Reproductive/Obstetrics                             Anesthesia Physical Anesthesia Plan  ASA: III  Anesthesia Plan: General   Post-op Pain Management: Dilaudid IV   Induction: Intravenous  PONV Risk Score and Plan: 1 and Ondansetron and Treatment may vary due to age or medical condition  Airway Management Planned: Oral ETT  Additional Equipment:   Intra-op Plan:   Post-operative Plan: Extubation in OR  Informed Consent: I have reviewed the patients History and Physical, chart, labs and discussed the procedure including the risks, benefits and alternatives for the proposed anesthesia with the patient or authorized representative who has indicated his/her understanding and acceptance.     Dental advisory given  Plan Discussed with: Anesthesiologist and Surgeon  Anesthesia Plan Comments:         Anesthesia Quick Evaluation

## 2023-02-27 NOTE — Anesthesia Procedure Notes (Signed)
Procedure Name: Intubation Date/Time: 02/27/2023 10:23 AM  Performed by: Waynard Edwards, CRNAPre-anesthesia Checklist: Patient identified, Emergency Drugs available, Suction available and Patient being monitored Patient Re-evaluated:Patient Re-evaluated prior to induction Oxygen Delivery Method: Circle system utilized Preoxygenation: Pre-oxygenation with 100% oxygen Induction Type: IV induction Ventilation: Mask ventilation without difficulty and Oral airway inserted - appropriate to patient size Laryngoscope Size: Hyacinth Meeker and 2 Grade View: Grade I Tube type: Oral Tube size: 7.5 mm Number of attempts: 1 Airway Equipment and Method: Stylet and Oral airway Placement Confirmation: ETT inserted through vocal cords under direct vision, positive ETCO2 and breath sounds checked- equal and bilateral Secured at: 23 cm Tube secured with: Tape Dental Injury: Teeth and Oropharynx as per pre-operative assessment

## 2023-02-27 NOTE — Transfer of Care (Signed)
Immediate Anesthesia Transfer of Care Note  Patient: Ryan [name]  Procedure(s) Performed: REVISION RIGHT BELOW KNEE AMPUTATION (Right: Knee) IRRIGATION AND DEBRIDEMENT LEFT BUTTOCK (Left: Buttocks) IRRIGATION AND DEBRIDEMENT SHOULDER  WOUND (Right: Shoulder)  Patient Location: PACU  Anesthesia Type:General  Level of Consciousness: drowsy  Airway & Oxygen Therapy: Patient Spontanous Breathing and Patient connected to face mask oxygen  Post-op Assessment: Report given to RN and Post -op Vital signs reviewed and stable  Post vital signs: Reviewed and stable  Last Vitals:  Vitals Value Taken Time  BP 126/85 02/27/23 1158  Temp    Pulse 82 02/27/23 1200  Resp 11 02/27/23 1200  SpO2 98 % 02/27/23 1200  Vitals shown include unvalidated device data.  Last Pain:  Vitals:   02/27/23 0850  TempSrc:   PainSc: 7       Patients Stated Pain Goal: 0 (02/26/23 1009)  Complications: No notable events documented.

## 2023-02-28 DIAGNOSIS — E1165 Type 2 diabetes mellitus with hyperglycemia: Secondary | ICD-10-CM | POA: Diagnosis not present

## 2023-02-28 LAB — AEROBIC/ANAEROBIC CULTURE W GRAM STAIN (SURGICAL/DEEP WOUND)

## 2023-02-28 LAB — GLUCOSE, CAPILLARY
Glucose-Capillary: 299 mg/dL — ABNORMAL HIGH (ref 70–99)
Glucose-Capillary: 338 mg/dL — ABNORMAL HIGH (ref 70–99)

## 2023-02-28 LAB — CULTURE, BLOOD (ROUTINE X 2): Culture: NO GROWTH

## 2023-02-28 MED ORDER — SODIUM CHLORIDE 0.9 % IV SOLN: INTRAVENOUS | Status: DC

## 2023-02-28 MED ORDER — INSULIN ASPART 100 UNIT/ML IJ SOLN
5.0000 [IU] | Freq: Three times a day (TID) | INTRAMUSCULAR | Status: DC
  Administered 2023-02-28 – 2023-03-02 (×7): 5 [IU] via SUBCUTANEOUS

## 2023-02-28 MED ORDER — INSULIN GLARGINE-YFGN 100 UNIT/ML ~~LOC~~ SOLN
35.0000 [IU] | Freq: Every day | SUBCUTANEOUS | Status: DC
  Administered 2023-02-28 – 2023-03-06 (×7): 35 [IU] via SUBCUTANEOUS
  Filled 2023-02-28 (×8): qty 0.35

## 2023-02-28 NOTE — Progress Notes (Signed)
PT Note  Patient Details Name: Ryan [name] MRN: [id number] DOB: [birth year]  Spoke with Dr Lajoyce Corners in regards to residual limb protector and obtained Medium from OR.  Went to place on pt but pt sitting EOB eating and would like to finish first.  Educated pt on donning brace and left with pt.  Will f/u next visit.  Anise Salvo, PT Acute Rehab Services [facility] [phone]    Rayetta Humphrey 02/28/2023, 5:20 PM

## 2023-02-28 NOTE — Evaluation (Signed)
Occupational Therapy Evaluation Patient Details Name: Ryan [name] MRN: [id number] DOB: 10-22-1966 Today's Date: 02/28/2023   History of Present Illness 57 y.o.M admitted on 02/24/23 due to cellulitis, abscesses and multiple wounds. Pt underwent I&D of R shoulder, and R BKA wounds on 02/27/23. 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   Clinical Impression   Pt admitted for concerns listed above. PTA pt reported that he was independent with all ADL's and mobility, however has formed multiple wounds that he is unable to take care of. At this time he is able to complete bed mobility independently and sit to stands with min guard, however requiring increased assist to hop forward and transfer to other surfaces. Overall he is requiring up to min assist for ADL's and min guard to min assist for mobility. Recommending further therapy to maximize his independence and ability to care for himself. OT will continue to follow acutely.      Recommendations for follow up therapy are one component of a multi-disciplinary discharge planning process, led by the attending physician.  Recommendations may be updated based on patient status, additional functional criteria and insurance authorization.   Assistance Recommended at Discharge Frequent or constant Supervision/Assistance  Patient can return home with the following A lot of help with walking and/or transfers;A little help with bathing/dressing/bathroom;Help with stairs or ramp for entrance;Assistance with cooking/housework    Functional Status Assessment  Patient has had a recent decline in their functional status and demonstrates the ability to make significant improvements in function in a reasonable and predictable amount of time.  Equipment Recommendations  BSC/3in1;Other (comment) (RW)    Recommendations for Other Services       Precautions / Restrictions Precautions Precautions:  Fall Restrictions Weight Bearing Restrictions: Yes RLE Weight Bearing: Non weight bearing      Mobility Bed Mobility Overal bed mobility: Independent             General bed mobility comments: No difficulties coming to sit EOB    Transfers Overall transfer level: Needs assistance Equipment used: Rollator (4 wheels) Transfers: Sit to/from Stand Sit to Stand: Min guard           General transfer comment: Min guard due to heavy use of rollator and rollator wheels not locking during transfer.      Balance Overall balance assessment: Needs assistance Sitting-balance support: No upper extremity supported, Feet supported Sitting balance-Leahy Scale: Good     Standing balance support: Bilateral upper extremity supported, During functional activity Standing balance-Leahy Scale: Poor Standing balance comment: Reliant on rollator                           ADL either performed or assessed with clinical judgement   ADL Overall ADL's : Needs assistance/impaired Eating/Feeding: Independent;Sitting   Grooming: Min guard;Standing   Upper Body Bathing: Independent;Sitting   Lower Body Bathing: Minimal assistance;Sitting/lateral leans;Sit to/from stand   Upper Body Dressing : Independent;Sitting   Lower Body Dressing: Minimal assistance;Sitting/lateral leans;Sit to/from stand   Toilet Transfer: Minimal assistance;Stand-pivot   Toileting- Architect and Hygiene: Min guard;Sitting/lateral lean;Sit to/from stand       Functional mobility during ADLs: Min guard;Minimal assistance;Rollator (4 wheels) General ADL Comments: Requiring assist due to weakness and balance deficits     Vision Baseline Vision/History: 1 Wears glasses Ability to See in Adequate Light: 0 Adequate Patient Visual Report: No change from baseline Vision Assessment?:  No apparent visual deficits     Perception Perception Perception Tested?: No   Praxis Praxis Praxis tested?:  Not tested    Pertinent Vitals/Pain Pain Assessment Pain Assessment: 0-10 Pain Score: 8  Pain Location: RLE, back and neck Pain Descriptors / Indicators: Aching, Discomfort, Grimacing Pain Intervention(s): Limited activity within patient's tolerance, Monitored during session, RN gave pain meds during session     Hand Dominance Right   Extremity/Trunk Assessment Upper Extremity Assessment Upper Extremity Assessment: Overall WFL for tasks assessed   Lower Extremity Assessment Lower Extremity Assessment: RLE deficits/detail RLE Deficits / Details: BKA, s/p I & D   Cervical / Trunk Assessment Cervical / Trunk Assessment: Normal   Communication Communication Communication: No difficulties   Cognition Arousal/Alertness: Awake/alert Behavior During Therapy: WFL for tasks assessed/performed Overall Cognitive Status: Within Functional Limits for tasks assessed                                       General Comments  VSS on RA, pt with woundvac on RLE and bandages on R shoulder    Exercises     Shoulder Instructions      Home Living Family/patient expects to be discharged to:: Private residence Living Arrangements: Non-relatives/Friends Available Help at Discharge: Friend(s);Available PRN/intermittently Type of Home: House Home Access: Stairs to enter [employer] of Steps: 4 steps Entrance Stairs-Rails: None Home Layout: One level     Bathroom Shower/Tub: Chief Strategy Officer: Standard Bathroom Accessibility: Yes How Accessible: Accessible via walker Home Equipment: Rollator (4 wheels);Shower seat          Prior Functioning/Environment Prior Level of Function : Independent/Modified Independent             Mobility Comments: Using Rollator and Prosthetic leg ADLs Comments: Reports independence        OT Problem List: Decreased strength;Decreased activity tolerance;Impaired balance (sitting and/or  standing);Decreased safety awareness;Impaired sensation;Impaired UE functional use;Pain      OT Treatment/Interventions: Self-care/ADL training;Therapeutic exercise;Energy conservation;DME and/or AE instruction;Therapeutic activities;Balance training;Patient/family education    OT Goals(Current goals can be found in the care plan section) Acute Rehab OT Goals Patient Stated Goal: To go home OT Goal Formulation: With patient Time For Goal Achievement: 03/14/23 Potential to Achieve Goals: Good ADL Goals Pt Will Perform Grooming: with modified independence;standing Pt Will Perform Lower Body Bathing: with modified independence;sitting/lateral leans;sit to/from stand Pt Will Perform Lower Body Dressing: with modified independence;sitting/lateral leans;sit to/from stand Pt Will Transfer to Toilet: with modified independence;ambulating Pt Will Perform Toileting - Clothing Manipulation and hygiene: with modified independence;sitting/lateral leans;sit to/from stand  OT Frequency: Min 2X/week    Co-evaluation              AM-PAC OT "6 Clicks" Daily Activity     Outcome Measure Help from another person eating meals?: None Help from another person taking care of personal grooming?: A Little Help from another person toileting, which includes using toliet, bedpan, or urinal?: A Little Help from another person bathing (including washing, rinsing, drying)?: A Little Help from another person to put on and taking off regular upper body clothing?: A Little Help from another person to put on and taking off regular lower body clothing?: A Little 6 Click Score: 19   End of Session Equipment Utilized During Treatment: Rollator (4 wheels) Nurse Communication: Mobility status  Activity Tolerance: Patient tolerated treatment well Patient left: in bed;with  call bell/phone within reach  OT Visit Diagnosis: Unsteadiness on feet (R26.81);Other abnormalities of gait and mobility (R26.89);Muscle weakness  (generalized) (M62.81)                Time: 1610-9604 OT Time Calculation (min): 31 min Charges:  OT General Charges $OT Visit: 1 Visit OT Evaluation $OT Eval Low Complexity: 1 Low OT Treatments $Self Care/Home Management : 8-22 mins  Trish Mage, OTR/L [place] [facility]  Clarabell Matsuoka Elane Bing Plume 02/28/2023, 2:35 PM

## 2023-02-28 NOTE — Progress Notes (Signed)
Inpatient Rehab Admissions Coordinator:  Consult received.  Therapy evaluations completed and SNF rehab is being recommended. Pt does not appear to warrant the intensity of CIR. TOC made aware.   LaureWolfgang PhoenixMS, CCC-SLP Admissions Coordinator [phone]

## 2023-02-28 NOTE — Progress Notes (Signed)
PROGRESS NOTE  Ryan [name]  DOB: Aug 17, 1966  PCP: Hortencia Conradi, NP ZOX:[id number]  DOA: 02/24/2023  LOS: 3 days  Hospital Day: 5  Brief narrative: Ryan [name] is a 57 y.o. male with PMH significant for uncontrolled DM2, right leg osteomyelitis s/p right BKA 2022, chronic pain syndrome on chronic opiates, chronic smoker 4/16, patient presented to the ED with complaint of multiple wounds on his right shoulder, right leg, left buttocks Patient seems to be noncompliant to follow ups and medications except for his pain medicines. Few weeks ago, patient noticed a boil on his right shoulder which got worse.  Few days ago, he noticed a similar boil on the right buttock.  He also noticed a sore spot on his right BKA stump that is not draining.  In the ED, afebrile, hemodynamically stable Initial labs with WBC count 11.9, hemoglobin 11.4, lactic acid 3, sodium 127, glucose elevated at 648 CRP 11.2, ESR elevated to 113 Urine ketones negative Right femur x-ray showed below-knee amputation.  No acute osseous lesions. Admitted to [facility] General surgery and orthopedics were consulted. 4/19, he had a combined procedure by draining which he underwent I&D and sharp debridement of right posterior shoulder and left buttock abscess followed by revision of right BKA. See below for details.  Subjective: Patient was seen and examined this morning. Lying on bed.  Complains of inadequate pain control. Also has blood sugar running very high.  He does not like fingersticks.  Assessment and plan: Left buttock abscess Right upper back/shoulder wound IND of both wounds by general surgery on 4/19 Wet-to-dry dressing change per general surgery. Currently on IV Rocephin and IV vancomycin.  Culture sent from the OR.  Right BKA stump cellulitis/drainage 4/19, revision of right BKA by Dr. Lajoyce Corners.  Tissue culture and bone culture pending.  Wound VAC in place.  Per his note this morning, patient may be  discharged with a Prevena plus portable wound VAC pump at discharge.  Type 2 diabetes mellitus uncontrolled with hyperglycemia A1c uncontrolled, more than 15 Not taking any medicines at home Sugar level running elevated over 300. Increase Lantus to 35 units for tonight.  Also started on scheduled Premeal aspart 5 units 3 times daily along with sliding scale insulin He does not like fingersticks.  Will discuss with diabetes coordinator on Monday to obtain freestyle libre for CBG Recent Labs  Lab 02/27/23 1009 02/27/23 1200 02/27/23 1344 02/27/23 1708 02/27/23 2007  GLUCAP 127* 133* 154* 352* 366*    Chronic pain syndrome  Chronic opiate use  sees pain clinic in [place], [place]. PTA on Neurontin and oxycodone 15 mg 7 times a day Continue same. Also on as needed IV Dilaudid for severe pain.  Tobacco use disorder Continues to smoke 1 pack a day.     Mobility: Was using prosthesis on the right amputation stump.  PT eval pending.  Goals of care   Code Status: Full Code     DVT prophylaxis:  SCD's Start: 02/27/23 1342 heparin injection 5,000 Units Start: 02/25/23 0100 SCDs Start: 02/25/23 0045   Antimicrobials: IV Rocephin, IV vancomycin Fluid: Encourage oral hydration.  Can stop IV fluid today. Consultants: General surgery, orthopedics Family Communication: None at bedside  Status: Inpatient Level of care:  Med-Surg   Needs to continue in-hospital care:  Pending OR cultures  Patient from: Home Anticipated d/c to: Pending clinical course.    Diet:  Diet Order  Diet Carb Modified Fluid consistency: Thin; Room service appropriate? Yes  Diet effective now                   Scheduled Meds:  vitamin C  1,000 mg Oral Daily   docusate sodium  100 mg Oral Daily   gabapentin  800 mg Oral TID   heparin  5,000 Units Subcutaneous Q8H   insulin aspart  0-15 Units Subcutaneous TID WC   insulin aspart  0-5 Units Subcutaneous QHS   insulin aspart  5  Units Subcutaneous TID WC   insulin glargine-yfgn  10 Units Subcutaneous Once   insulin glargine-yfgn  35 Units Subcutaneous QHS   leptospermum manuka honey  1 Application Topical Daily   leptospermum manuka honey  1 Application Topical Daily   nutrition supplement (JUVEN)  1 packet Oral BID BM   pantoprazole  40 mg Oral Daily   Zinc Oxide   Topical BID   zinc sulfate  220 mg Oral Daily    PRN meds: acetaminophen, alum & mag hydroxide-simeth, bisacodyl, guaiFENesin-dextromethorphan, hydrALAZINE, HYDROmorphone (DILAUDID) injection, labetalol, magnesium citrate, magnesium sulfate bolus IVPB, metoprolol tartrate, ondansetron, ondansetron **OR** [DISCONTINUED] ondansetron (ZOFRAN) IV, oxyCODONE, oxyCODONE, oxyCODONE, phenol, polyethylene glycol, potassium chloride   Infusions:   sodium chloride 75 mL/hr at 02/28/23 1418   cefTRIAXone (ROCEPHIN)  IV 2 g (02/28/23 1417)   magnesium sulfate bolus IVPB     vancomycin 1,250 mg (02/28/23 0607)    Antimicrobials: Anti-infectives (From admission, onward)    Start     Dose/Rate Route Frequency Ordered Stop   02/27/23 1430  ceFAZolin (ANCEF) IVPB 2g/100 mL premix  Status:  Discontinued        2 g 200 mL/hr over 30 Minutes Intravenous Every 8 hours 02/27/23 1341 02/27/23 1416   02/27/23 1130  ceFAZolin (ANCEF) IVPB 2g/100 mL premix        2 g 200 mL/hr over 30 Minutes Intravenous On call to O.R. 02/26/23 1840 02/27/23 1049   02/27/23 1128  vancomycin (VANCOCIN) powder  Status:  Discontinued          As needed 02/27/23 1148 02/27/23 1155   02/25/23 1530  vancomycin (VANCOREADY) IVPB 1250 mg/250 mL        1,250 mg 166.7 mL/hr over 90 Minutes Intravenous Every 12 hours 02/25/23 1430     02/25/23 1430  cefTRIAXone (ROCEPHIN) 2 g in sodium chloride 0.9 % 100 mL IVPB        2 g 200 mL/hr over 30 Minutes Intravenous Every 24 hours 02/25/23 1339     02/25/23 0900  vancomycin (VANCOCIN) IVPB 1000 mg/200 mL premix  Status:  Discontinued        1,000  mg 200 mL/hr over 60 Minutes Intravenous Every 12 hours 02/24/23 2323 02/25/23 0010   02/25/23 0600  cephALEXin (KEFLEX) capsule 500 mg  Status:  Discontinued        500 mg Oral Every 8 hours 02/25/23 0010 02/25/23 1339   02/24/23 1930  vancomycin (VANCOCIN) IVPB 1000 mg/200 mL premix  Status:  Discontinued        1,000 mg 200 mL/hr over 60 Minutes Intravenous  Once 02/24/23 1921 02/24/23 1926   02/24/23 1930  cefTRIAXone (ROCEPHIN) 2 g in sodium chloride 0.9 % 100 mL IVPB        2 g 200 mL/hr over 30 Minutes Intravenous  Once 02/24/23 1921 02/24/23 2058   02/24/23 1930  vancomycin (VANCOREADY) IVPB 2000 mg/400 mL  2,000 mg 200 mL/hr over 120 Minutes Intravenous  Once 02/24/23 1926 02/24/23 2311       Nutritional status:  Body mass index is 30.54 kg/m.          Objective: Vitals:   02/28/23 0505 02/28/23 0823  BP: (!) 142/84 127/73  Pulse: 95 85  Resp: 15   Temp: (!) 97.4 F (36.3 C) 98.2 F (36.8 C)  SpO2: 100% 100%    Intake/Output Summary (Last 24 hours) at 02/28/2023 1501 Last data filed at 02/28/2023 1422 Gross per 24 hour  Intake 2592.08 ml  Output 2450 ml  Net 142.08 ml    Filed Weights   02/24/23 2004 02/27/23 0835  Weight: 105.6 kg 105 kg   Weight change:  Body mass index is 30.54 kg/m.   Physical Exam: General exam: Middle-aged Caucasian male.  Pain partially controlled. Skin: No rashes, lesions or ulcers. HEENT: Atraumatic, normocephalic, no obvious bleeding Lungs: Clear to auscultation bilaterally CVS: Regular rate and rhythm, no murmur GI/Abd soft, nontender, nondistended, bowel sound present CNS: Alert, awake, slow to respond, oriented x 3 Psychiatry: Mood appropriate.  Not so conversational. Extremities: Right BKA revision with bandage to stump. Right shoulder area with postsurgical status.  Data Review: I have personally reviewed the laboratory data and studies available.  F/u labs ordered Unresulted Labs (From admission, onward)      Start     Ordered   02/24/23 2347  Rapid urine drug screen (hospital performed)  Add-on,   AD        02/24/23 2346            Total time spent in review of labs and imaging, patient evaluation, formulation of plan, documentation and communication with family: 45 minutes  Signed, Lorin Glass, MD [facility] 02/28/2023

## 2023-02-28 NOTE — Evaluation (Signed)
Physical Therapy Evaluation Patient Details Name: Ryan [name] MRN: [id number] DOB: [birth year] Today's Date: 02/28/2023  History of Present Illness  Pt is 58 y.o.M admitted on 02/24/23 due to cellulitis, abscesses and multiple wounds. 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  Clinical Impression  Pt admitted with above diagnosis. At baseline, pt is ambulatory with rollator and his R BKA prosthesis.  He lives with friends but reports little assistance available at d/c and has 4 steps to enter home.  Today, pt was sitting EOB at arrival.  He was able to stand and take a few steps (hops on L LE) using rollator with min guard but declined further.  Pt with limited ROM R knee due to pain -encouraged/educated to elevate and rest with leg straight.  Pt with decreased insight into safety awareness and deficits.  Barriers to return home include lack of support, NWB R LE status, reports w/c would be hard to manage, and 4 steps to enter his home.  Pt currently with functional limitations due to the deficits listed below (see PT Problem List). Pt will benefit from acute skilled PT to increase their independence and safety with mobility to allow discharge.          Recommendations for follow up therapy are one component of a multi-disciplinary discharge planning process, led by the attending physician.  Recommendations may be updated based on patient status, additional functional criteria and insurance authorization.  Follow Up Recommendations       Assistance Recommended at Discharge Intermittent Supervision/Assistance  Patient can return home with the following  A little help with walking and/or transfers;A little help with bathing/dressing/bathroom;Assistance with cooking/housework;Help with stairs or ramp for entrance    Equipment Recommendations  (RW vs  potential w/c pending progress)  Recommendations for Other Services       Functional Status Assessment Patient has had a recent decline in their functional status and demonstrates the ability to make significant improvements in function in a reasonable and predictable amount of time.     Precautions / Restrictions Precautions Precautions: Fall Restrictions Weight Bearing Restrictions: Yes RLE Weight Bearing: Non weight bearing      Mobility  Bed Mobility Overal bed mobility: Independent             General bed mobility comments: Sitting EOB at arrival    Transfers Overall transfer level: Needs assistance Equipment used: Rollator (4 wheels) Transfers: Sit to/from Stand Sit to Stand: Min guard           General transfer comment: Min guard for safety; cues for hand placement    Ambulation/Gait Ambulation/Gait assistance: Min guard Gait Distance (Feet): 4 Feet Assistive device: Rollator (4 wheels) Gait Pattern/deviations: Step-to pattern Gait velocity: decreased     General Gait Details: Pt hopping on L LE using rollator (no RW in room , therapist offered to find one but pt declined); min guard safety and to stabilze Rollator; cues for safety; declined further ambulation  Stairs            Wheelchair Mobility    Modified Rankin (Stroke Patients Only)       Balance Overall balance assessment: Needs assistance Sitting-balance support: No upper extremity supported, Feet supported Sitting balance-Leahy Scale: Good     Standing balance support: Bilateral upper extremity supported, During functional activity Standing balance-Leahy Scale: Poor Standing balance comment:  Reliant on rollator                             Pertinent Vitals/Pain Pain Assessment Pain Assessment: 0-10 Pain Score: 8  Pain Location: RLE, back and neck Pain Descriptors / Indicators: Aching, Discomfort, Grimacing Pain Intervention(s): Limited activity within  patient's tolerance, Monitored during session, Premedicated before session    Home Living Family/patient expects to be discharged to:: Private residence Living Arrangements: Non-relatives/Friends Available Help at Discharge: Friend(s);Available PRN/intermittently (reports he won't have much help) Type of Home: House Home Access: Stairs to enter Entrance Stairs-Rails: None Entrance Stairs-Number of Steps: 4 steps   Home Layout: One level Home Equipment: Rollator (4 wheels);Shower seat;Rolling Environmental consultant (2 wheels) Additional Comments: Rollator but it is broken (borrowed from friend); Reports RW may not be in good condition    Prior Function Prior Level of Function : Independent/Modified Independent;Driving             Mobility Comments: Using Rollator and Prosthetic leg can ambulate in community; Reports he drove himself home 2 days after his R BKA ADLs Comments: Reports independence with ADLs and IADL     Hand Dominance   Dominant Hand: Right    Extremity/Trunk Assessment   Upper Extremity Assessment Upper Extremity Assessment: Overall WFL for tasks assessed    Lower Extremity Assessment Lower Extremity Assessment: RLE deficits/detail;LLE deficits/detail RLE Deficits / Details: R BKA from 2022 but s/p Revision POD #1;  ROM : knee ~15 to 45 degrees limited by pain ; MMT: knee and hip 3/5 not further tested LLE Deficits / Details: ROM WFL; MMT 5/5; +edema throughout    Cervical / Trunk Assessment Cervical / Trunk Assessment: Normal  Communication   Communication: No difficulties  Cognition Arousal/Alertness: Awake/alert Behavior During Therapy: WFL for tasks assessed/performed Overall Cognitive Status: Within Functional Limits for tasks assessed                                 General Comments: Overall WFL but decreased safety awareness and awareness of deficits        General Comments General comments (skin integrity, edema, etc.): VSS on RA.  Pt  sitting at EOB with legs in dependent position.  Encouraged/educated on importance of elevation to limit swelling.  Also, discussed importance of resting with leg straight to prevent contracture for future prosthetic fitting.  Pt reports he is aware of both recommendations.  States he needs pillows to elevate b/c blankets are too stiff and painful (PT searched all 2 W and 3 W closets for pillows and notified charge nurse).  Pt also reports had residual limb protector after last amputation to keep leg straight and protect and would like one this time- sent secure chat to Dr. Lajoyce Corners notifying him of pt request.    Exercises     Assessment/Plan    PT Assessment Patient needs continued PT services  PT Problem List Decreased strength;Pain;Decreased range of motion;Decreased activity tolerance;Decreased balance;Decreased mobility;Decreased knowledge of precautions;Decreased safety awareness;Decreased knowledge of use of DME       PT Treatment Interventions DME instruction;Therapeutic exercise;Gait training;Stair training;Functional mobility training;Therapeutic activities;Patient/family education;Wheelchair mobility training;Balance training;Neuromuscular re-education;Modalities    PT Goals (Current goals can be found in the Care Plan section)  Acute Rehab PT Goals Patient Stated Goal: return home PT Goal Formulation: With patient Time For Goal Achievement: 03/14/23 Potential to Achieve Goals: Fair Additional  Goals Additional Goal #1: Pt will mobilize with w/c for 200' with supervision    Frequency Min 4X/week     Co-evaluation               AM-PAC PT "6 Clicks" Mobility  Outcome Measure Help needed turning from your back to your side while in a flat bed without using bedrails?: None Help needed moving from lying on your back to sitting on the side of a flat bed without using bedrails?: None Help needed moving to and from a bed to a chair (including a wheelchair)?: A Little Help  needed standing up from a chair using your arms (e.g., wheelchair or bedside chair)?: A Little Help needed to walk in hospital room?: Total Help needed climbing 3-5 steps with a railing? : Total 6 Click Score: 16    End of Session Equipment Utilized During Treatment: Gait belt Activity Tolerance: Other (comment) (Limited by pain and self limiting) Patient left: in bed;with call bell/phone within reach;with bed alarm set Nurse Communication: Mobility status PT Visit Diagnosis: Other abnormalities of gait and mobility (R26.89);Muscle weakness (generalized) (M62.81)    Time: 1610-9604 PT Time Calculation (min) (ACUTE ONLY): 25 min   Charges:   PT Evaluation $PT Eval Low Complexity: 1 Low PT Treatments $Therapeutic Activity: 8-22 mins        Anise Salvo, PT Acute Rehab [facility] Re[facility] [phone]   Rayetta Humphrey 02/28/2023, 4:30 PM

## 2023-02-28 NOTE — Progress Notes (Signed)
Patient ID: Ryan [name], male   DOB: 21-Feb-1966, 57 y.o.   MRN: [id number] Patient is postoperative day 1 revision right transtibial amputation.  There is no drainage in the wound VAC canister there is a good seal.  Vancomycin powder was placed within the residual tibia tissue and bone were sent for cultures separately.  Patient may discharge with the Praveena plus portable wound VAC pump at time of discharge.  Tissue cultures and bone cultures pending.

## 2023-02-28 NOTE — Progress Notes (Signed)
Progress Note  1 Day Post-Op  Subjective: Pt with pain from 3 surgical sites.  No fevers.  Pt with a lot of complaints about stump/prosthetic issues.  Dr. Lajoyce Corners present at the time and worked with those answers.     Objective: Vital signs in last 24 hours: Temp:  [97.4 F (36.3 C)-98.2 F (36.8 C)] 98.2 F (36.8 C) (04/20 0823) Pulse Rate:  [82-100] 85 (04/20 0823) Resp:  [11-18] 15 (04/20 0505) BP: (127-160)/(69-101) 127/73 (04/20 0823) SpO2:  [95 %-100 %] 100 % (04/20 0823) Last BM Date : 02/27/23  Intake/Output from previous day: 04/19 0701 - 04/20 0700 In: 3702.1 [P.O.:477; I.V.:2448.8; IV Piggyback:776.3] Out: 1875 [Urine:1650; Blood:225] Intake/Output this shift: Total I/O In: 237 [P.O.:237] Out: -   PE: General: A&O x 3 after awakened.  Looks uncomfortable.  Lungs: Respiratory effort nonlabored Skin:   Left shoulder wound clean. Surrounding tissue still indurated with some cellulitis.   Buttock wound dressing c/d/I.  Not taken down this AM.    Lab Results:  Recent Labs    02/27/23 0455  WBC 9.4  HGB 11.3*  HCT 34.2*  PLT 382   BMET No results for input(s): "NA", "K", "CL", "CO2", "GLUCOSE", "BUN", "CREATININE", "CALCIUM" in the last 72 hours.  PT/INR No results for input(s): "LABPROT", "INR" in the last 72 hours. CMP     Component Value Date/Time   NA 133 (L) 02/25/2023 0429   K 3.8 02/25/2023 0429   CL 101 02/25/2023 0429   CO2 25 02/25/2023 0429   GLUCOSE 188 (H) 02/25/2023 0429   BUN 13 02/25/2023 0429   CREATININE 0.87 02/25/2023 0429   CALCIUM 8.0 (L) 02/25/2023 0429   PROT 6.1 (L) 02/25/2023 0429   ALBUMIN 1.8 (L) 02/25/2023 0429   AST 13 (L) 02/25/2023 0429   ALT 12 02/25/2023 0429   ALKPHOS 103 02/25/2023 0429   BILITOT 0.4 02/25/2023 0429   GFRNONAA >60 02/25/2023 0429   GFRAA >90 04/02/2012 0948   Lipase  No results found for: "LIPASE"     Studies/Results: No results found.  Anti-infectives: Anti-infectives (From  admission, onward)    Start     Dose/Rate Route Frequency Ordered Stop   02/27/23 1430  ceFAZolin (ANCEF) IVPB 2g/100 mL premix  Status:  Discontinued        2 g 200 mL/hr over 30 Minutes Intravenous Every 8 hours 02/27/23 1341 02/27/23 1416   02/27/23 1130  ceFAZolin (ANCEF) IVPB 2g/100 mL premix        2 g 200 mL/hr over 30 Minutes Intravenous On call to O.R. 02/26/23 1840 02/27/23 1049   02/27/23 1128  vancomycin (VANCOCIN) powder  Status:  Discontinued          As needed 02/27/23 1148 02/27/23 1155   02/25/23 1530  vancomycin (VANCOREADY) IVPB 1250 mg/250 mL        1,250 mg 166.7 mL/hr over 90 Minutes Intravenous Every 12 hours 02/25/23 1430     02/25/23 1430  cefTRIAXone (ROCEPHIN) 2 g in sodium chloride 0.9 % 100 mL IVPB        2 g 200 mL/hr over 30 Minutes Intravenous Every 24 hours 02/25/23 1339     02/25/23 0900  vancomycin (VANCOCIN) IVPB 1000 mg/200 mL premix  Status:  Discontinued        1,000 mg 200 mL/hr over 60 Minutes Intravenous Every 12 hours 02/24/23 2323 02/25/23 0010   02/25/23 0600  cephALEXin (KEFLEX) capsule 500 mg  Status:  Discontinued  500 mg Oral Every 8 hours 02/25/23 0010 02/25/23 1339   02/24/23 1930  vancomycin (VANCOCIN) IVPB 1000 mg/200 mL premix  Status:  Discontinued        1,000 mg 200 mL/hr over 60 Minutes Intravenous  Once 02/24/23 1921 02/24/23 1926   02/24/23 1930  cefTRIAXone (ROCEPHIN) 2 g in sodium chloride 0.9 % 100 mL IVPB        2 g 200 mL/hr over 30 Minutes Intravenous  Once 02/24/23 1921 02/24/23 2058   02/24/23 1930  vancomycin (VANCOREADY) IVPB 2000 mg/400 mL        2,000 mg 200 mL/hr over 120 Minutes Intravenous  Once 02/24/23 1926 02/24/23 2311        Assessment/Plan Left Buttock abscess Right upper back/shoulder wound  POD 1 debridement and drainage of both sites.   Wound clean on shoulder. Continue wet to dry dressing changes.  Cx NGTD from both wounds.    BKA stump per Dr. Lajoyce Corners   FEN: Carb mod  ID:  ceftriaxone/vancomycin VTE: heparin subq   Per primary T2DM Chronic pain Tobacco use  I reviewed hospitalist notes, last 24 h vitals and pain scores, last 48 h intake and output, last 24 h labs and trends, last 24 h imaging results, and discussed with orthopedic surgery .    LOS: 3 days   Almond Lint, MD [facility] 02/28/2023, 9:32 AM Please see Amion for pager number during day hours 7:00am-4:30pm

## 2023-02-28 NOTE — Progress Notes (Signed)
Pharmacy Antibiotic Note  Ryan [name] is a 57 y.o. male admitted on 02/24/2023 with cellulitis and abscess.  Pharmacy has been consulted for vancomycin dosing. He has multiple wounds including a left buttock abscess, right shoulder wound, and a right BKA stump wound. These wounds have been draining pus as well.   Patient received vancomycin 2g IV x1 in the ED 4/16 ~2100 and has since continued on vancomycin and ceftriaxone. He is afebrile with no signs of hypotension. WBC has trended down from 11k to 9k.   General surgery performed I&D of the right shoulder and left buttock wound on 4/19. Orthopedic surgery performed a revision of the right BKA on 4/19 as well.   Plan: Continue Vancomycin  IV q12 hours (eAUC 507, Scr 0.87, Vd 0.5) Continue Ceftriaxone 2g IV q24h per MD Follow up culture data, renal function, vanc levels as necessary   Height:  (185.4 cm) Weight: 105 kg (231 lb 7.7 oz) IBW/kg (Calculated) : 79.9  Temp (24hrs), Avg:97.9 F (36.6 C), Min:97.4 F (36.3 C), Max:98.2 F (36.8 C)  Recent Labs  Lab 02/24/23 1735 02/25/23 0429 02/27/23 0455  WBC 11.9* 13.2* 9.4  CREATININE 1.13 0.87  --   LATICACIDVEN 3.0*  --   --      Estimated Creatinine Clearance: 120.6 mL/min (by C-G formula based on SCr of 0.87 mg/dL).    Allergies  Allergen Reactions   Acetaminophen Nausea And Vomiting    Antimicrobials this admission: 4/16 ceftriaxone >> 4/16 vancomycin >>   Microbiology results: 4/16 BCx: NG x 4 days  4/16 resp panel: negative  4/19: Deep wound Cx: No org seen, pending    Thank you for allowing pharmacy to be a part of this patient's care.  Blane Ohara, PharmD  PGY1 Pharmacy Resident

## 2023-03-01 DIAGNOSIS — E1165 Type 2 diabetes mellitus with hyperglycemia: Secondary | ICD-10-CM | POA: Diagnosis not present

## 2023-03-01 LAB — GLUCOSE, CAPILLARY
Glucose-Capillary: 178 mg/dL — ABNORMAL HIGH (ref 70–99)
Glucose-Capillary: 343 mg/dL — ABNORMAL HIGH (ref 70–99)

## 2023-03-01 LAB — CULTURE, BLOOD (ROUTINE X 2)

## 2023-03-01 LAB — AEROBIC/ANAEROBIC CULTURE W GRAM STAIN (SURGICAL/DEEP WOUND): Gram Stain: NONE SEEN

## 2023-03-01 NOTE — Progress Notes (Signed)
PROGRESS NOTE  Ryan [name]  DOB: [birth year]  PCP: Hortencia Conradi, NP WGN:[id number]  DOA: 02/24/2023  LOS: 4 days  Hospital Day: 6  Brief narrative: Ryan [name] is a 57 y.o. male with PMH significant for uncontrolled DM2, right leg osteomyelitis s/p right BKA 2022, chronic pain syndrome on chronic opiates, chronic smoker 4/16, patient presented to the ED with complaint of multiple wounds on his right shoulder, right leg, left buttocks Patient seems to be noncompliant to follow ups and medications except for his pain medicines. Few weeks ago, patient noticed a boil on his right shoulder which got worse.  Few days ago, he noticed a similar boil on the right buttock.  He also noticed a sore spot on his right BKA stump that is not draining.  In the ED, afebrile, hemodynamically stable Initial labs with WBC count 11.9, hemoglobin 11.4, lactic acid 3, sodium 127, glucose elevated at 648 CRP 11.2, ESR elevated to 113 Urine ketones negative Right femur x-ray showed below-knee amputation.  No acute osseous lesions. Admitted to [facility] General surgery and orthopedics were consulted. 4/19, he had a combined procedure by draining which he underwent I&D and sharp debridement of right posterior shoulder and left buttock abscess followed by revision of right BKA. See below for details.  Subjective: Patient was seen and examined this morning. Sitting up at the edge of the bed.  Not in distress. Does not like fingersticks done for glucose monitoring.  Asking for CBG monitor.  Assessment and plan: Left buttock abscess Right upper back/shoulder wound IND of both wounds by general surgery on 4/19 Wet-to-dry dressing change per general surgery. Currently on IV Rocephin and IV vancomycin.  Culture sent from the OR.  Right BKA stump cellulitis/drainage 4/19, revision of right BKA by Dr. Lajoyce Corners.  Tissue culture and bone culture pending.  Wound VAC in place.  Per his note this morning, patient may  be discharged with a Prevena plus portable wound VAC pump at discharge.  Type 2 diabetes mellitus uncontrolled with hyperglycemia A1c uncontrolled, more than 15 Not taking any medicines at home Sugar level running elevated over 300. Currently on Lantus to 35 units, scheduled Premeal aspart 5 units 3 times daily along with sliding scale insulin He does not like fingersticks.  Will discuss with diabetes coordinator on Monday to obtain freestyle libre for CBG Recent Labs  Lab 02/27/23 1708 02/27/23 2007 02/28/23 1714 02/28/23 2018 03/01/23 0816  GLUCAP 352* 366* 299* 338* 178*    Chronic pain syndrome  Chronic opiate use  sees pain clinic in [place], [place]. PTA on Neurontin and oxycodone 15 mg 7 times a day Continue same. Also on as needed IV Dilaudid for severe pain.  Tobacco use disorder Continues to smoke 1 pack a day.     Mobility: Was using prosthesis on the right amputation stump.  PT eval pending.  Goals of care   Code Status: Full Code     DVT prophylaxis:  SCD's Start: 02/27/23 1342 heparin injection 5,000 Units Start: 02/25/23 0100 SCDs Start: 02/25/23 0045   Antimicrobials: IV Rocephin, IV vancomycin Fluid: Encourage oral hydration.  Not on IV fluid Consultants: General surgery, orthopedics Family Communication: None at bedside  Status: Inpatient Level of care:  Med-Surg   Needs to continue in-hospital care:  Pending OR cultures  Patient from: Home Anticipated d/c to: Pending clinical course.    Diet:  Diet Order             Diet Carb Modified  Fluid consistency: Thin; Room service appropriate? Yes  Diet effective now                   Scheduled Meds:  vitamin C  1,000 mg Oral Daily   docusate sodium  100 mg Oral Daily   gabapentin  800 mg Oral TID   heparin  5,000 Units Subcutaneous Q8H   insulin aspart  0-15 Units Subcutaneous TID WC   insulin aspart  0-5 Units Subcutaneous QHS   insulin aspart  5 Units Subcutaneous TID WC    insulin glargine-yfgn  10 Units Subcutaneous Once   insulin glargine-yfgn  35 Units Subcutaneous QHS   leptospermum manuka honey  1 Application Topical Daily   leptospermum manuka honey  1 Application Topical Daily   nutrition supplement (JUVEN)  1 packet Oral BID BM   pantoprazole  40 mg Oral Daily   Zinc Oxide   Topical BID   zinc sulfate  220 mg Oral Daily    PRN meds: acetaminophen, alum & mag hydroxide-simeth, bisacodyl, guaiFENesin-dextromethorphan, hydrALAZINE, HYDROmorphone (DILAUDID) injection, labetalol, magnesium citrate, magnesium sulfate bolus IVPB, metoprolol tartrate, ondansetron, ondansetron **OR** [DISCONTINUED] ondansetron (ZOFRAN) IV, oxyCODONE, oxyCODONE, oxyCODONE, phenol, polyethylene glycol, potassium chloride   Infusions:   cefTRIAXone (ROCEPHIN)  IV 2 g (03/01/23 1354)   magnesium sulfate bolus IVPB     vancomycin 1,250 mg (03/01/23 0605)    Antimicrobials: Anti-infectives (From admission, onward)    Start     Dose/Rate Route Frequency Ordered Stop   02/27/23 1430  ceFAZolin (ANCEF) IVPB 2g/100 mL premix  Status:  Discontinued        2 g 200 mL/hr over 30 Minutes Intravenous Every 8 hours 02/27/23 1341 02/27/23 1416   02/27/23 1130  ceFAZolin (ANCEF) IVPB 2g/100 mL premix        2 g 200 mL/hr over 30 Minutes Intravenous On call to O.R. 02/26/23 1840 02/27/23 1049   02/27/23 1128  vancomycin (VANCOCIN) powder  Status:  Discontinued          As needed 02/27/23 1148 02/27/23 1155   02/25/23 1530  vancomycin (VANCOREADY) IVPB 1250 mg/250 mL        1,250 mg 166.7 mL/hr over 90 Minutes Intravenous Every 12 hours 02/25/23 1430     02/25/23 1430  cefTRIAXone (ROCEPHIN) 2 g in sodium chloride 0.9 % 100 mL IVPB        2 g 200 mL/hr over 30 Minutes Intravenous Every 24 hours 02/25/23 1339     02/25/23 0900  vancomycin (VANCOCIN) IVPB 1000 mg/200 mL premix  Status:  Discontinued        1,000 mg 200 mL/hr over 60 Minutes Intravenous Every 12 hours 02/24/23 2323  02/25/23 0010   02/25/23 0600  cephALEXin (KEFLEX) capsule 500 mg  Status:  Discontinued        500 mg Oral Every 8 hours 02/25/23 0010 02/25/23 1339   02/24/23 1930  vancomycin (VANCOCIN) IVPB 1000 mg/200 mL premix  Status:  Discontinued        1,000 mg 200 mL/hr over 60 Minutes Intravenous  Once 02/24/23 1921 02/24/23 1926   02/24/23 1930  cefTRIAXone (ROCEPHIN) 2 g in sodium chloride 0.9 % 100 mL IVPB        2 g 200 mL/hr over 30 Minutes Intravenous  Once 02/24/23 1921 02/24/23 2058   02/24/23 1930  vancomycin (VANCOREADY) IVPB 2000 mg/400 mL        2,000 mg 200 mL/hr over 120 Minutes Intravenous  Once  02/24/23 1926 02/24/23 2311       Nutritional status:  Body mass index is 30.71 kg/m.          Objective: Vitals:   03/01/23 0330 03/01/23 0750  BP: 119/82 126/81  Pulse: (!) 102 91  Resp: 15 17  Temp: (!) 97.4 F (36.3 C) 98 F (36.7 C)  SpO2: 99% 98%    Intake/Output Summary (Last 24 hours) at 03/01/2023 1441 Last data filed at 03/01/2023 1400 Gross per 24 hour  Intake 1130.56 ml  Output 3900 ml  Net -2769.44 ml    Filed Weights   02/24/23 2004 02/27/23 0835 03/01/23 0500  Weight: 105.6 kg 105 kg 105.6 kg   Weight change: 0.6 kg Body mass index is 30.71 kg/m.   Physical Exam: General exam: Middle-aged Caucasian male. Pain partially controlled. Skin: No rashes, lesions or ulcers. HEENT: Atraumatic, normocephalic, no obvious bleeding Lungs: Clear to auscultation bilaterally CVS: Regular rate and rhythm, no murmur GI/Abd soft, nontender, nondistended, bowel sound present CNS: Alert, awake, slow to respond, oriented x 3 Psychiatry: Mood appropriate.  Not so conversational. Extremities: Right BKA revision with bandage to stump. Right shoulder area with postsurgical status.  Data Review: I have personally reviewed the laboratory data and studies available.  F/u labs ordered Unresulted Labs (From admission, onward)     Start     Ordered   03/02/23 0500   CBC  Daily,   R     Question:  Specimen collection method  Answer:  Lab=Lab collect   03/01/23 1257   03/02/23 0500  Basic metabolic panel  Daily,   R     Question:  Specimen collection method  Answer:  Lab=Lab collect   03/01/23 1257   02/24/23 2347  Rapid urine drug screen (hospital performed)  Add-on,   AD        02/24/23 2346            Total time spent in review of labs and imaging, patient evaluation, formulation of plan, documentation and communication with family: 45 minutes  Signed, Lorin Glass, MD [facility] 03/01/2023

## 2023-03-01 NOTE — TOC Initial Note (Signed)
Transition of Care [facility]) - Initial/Assessment Note    Patient Details  Name: Ryan [name] MRN: [id number] Date of Birth: [birth year]  [facility]) CM/SW Contact:    Helene Kelp, LCSW Phone Number: 03/01/2023, 12:58 PM  Clinical Narrative:                 CSW met met with the patient at the bedside and reviewed clinical updates and current clinical disposition.  Patient responded being okay with SNF rehab referral recommendation and expressed preferences.   CSW checked with the patient to identify any concern, if any. The patient expressed needing support with DME device for right leg amputation, which he notes has been defective based on the strap design on the device.   CSW completed TOC work up (per clinical team updates) and referred the patient out for SNF Rehab post d/c needs. No other needs identified by this Clinical research associate currently. Patient needs and current disposition to be followed by unit CSW/CM.   Completed by CSW: TOC assess FL2 PASSR [id number] A SNF Rehab referrals (No preference expressed by the patient)  Follow-up needs: Check bed status offers and update clinical supportive progress with pt, natural supports and clinical team.   Expected Discharge Plan: Skilled Nursing Facility Barriers to Discharge: Continued Medical Work up   Patient Goals and CMS Choice Patient states their goals for this hospitalization and ongoing recovery are:: To gain strenght and manageable functional mobility CMS Medicare.gov Compare Post Acute Care list provided to:: Patient Choice offered to / list presented to : Patient      Expected Discharge Plan and Services In-house Referral: Clinical Social Work Discharge Planning Services: Other - See comment (TOC for coordination of care needs.) Post Acute Care Choice: Skilled Nursing Facility Living arrangements for the past 2 months: Single Family Home                 Prior Living Arrangements/Services Living arrangements  for the past 2 months: Single Family Home Lives with:: Self Patient language and need for interpreter reviewed:: Yes Do you feel safe going back to the place where you live?: Yes      Need for Family Participation in Patient Care: No (Comment) (Patient has limited to no natural support to aide with post d/c needs.) Care giver support system in place?: No (comment)   Criminal Activity/Legal Involvement Pertinent to Current Situation/Hospitalization: No - Comment as needed  Activities of Daily Living Home Assistive Devices/Equipment: Prosthesis, Eyeglasses, Shower chair with back, Walker (specify type) (rollator) ADL Screening (condition at time of admission) Patient's cognitive ability adequate to safely complete daily activities?: Yes Is the patient deaf or have difficulty hearing?: No Does the patient have difficulty seeing, even when wearing glasses/contacts?: Yes Does the patient have difficulty concentrating, remembering, or making decisions?: No Patient able to express need for assistance with ADLs?: Yes Does the patient have difficulty dressing or bathing?: No Independently performs ADLs?: Yes (appropriate for developmental age) Does the patient have difficulty walking or climbing stairs?: Yes Weakness of Legs: Both Weakness of Arms/Hands: Both  Permission Sought/Granted Permission sought to share information with : Case Manager, Oceanographer granted to share information with : Yes, Verbal Permission Granted     Emotional Assessment   Orientation: : Oriented to Self, Oriented to Place, Oriented to  Time, Oriented to Situation Alcohol / Substance Use: Tobacco Use (Please H&P note) Psych Involvement: No (comment)  Admission diagnosis:  Abscess [L02.91] Hyperglycemia [R73.9] Uncontrolled type 2  diabetes mellitus with hyperglycemia, without long-term current use of insulin [E11.65] Cellulitis, unspecified cellulitis site [L03.90] Patient Active  Problem List   Diagnosis Date Noted   Subacute osteomyelitis of right tibia 02/27/2023   Dehiscence of amputation stump of right lower extremity 02/27/2023   Uncontrolled type 2 diabetes mellitus with hyperglycemia, without long-term current use of insulin 02/24/2023   Left buttock abscess 02/24/2023   Open wound of right shoulder 02/24/2023   Chronic prescription opiate use - sees pain clinic in [place], [place]. 02/24/2023   S/P BKA (below knee amputation) unilateral, right 01/24/2021   Tobacco use disorder 03/19/2020   PCP:  Hortencia Conradi, NP Pharmacy:   [facility] [phone] - [place], [place] - [address] AT [facility] [address] [facility] [address] [place] [address] Phone: [phone] Fax: [phone]  Social Determinants of Health (SDOH) Social History: SDOH Screenings   Food Insecurity: Food Insecurity Present (02/24/2023)  Housing: Low Risk  (02/24/2023)  Transportation Needs: No Transportation Needs (02/24/2023)  Utilities: Not At Risk (02/24/2023)  Tobacco Use: High Risk (02/27/2023)   SDOH Interventions:   Readmission Risk Interventions     No data to display

## 2023-03-01 NOTE — NC FL2 (Signed)
[place] MEDICAID FL2 LEVEL OF CARE FORM     IDENTIFICATION  Patient Name: Ryan [name] Birthdate: 07-Feb-1966 Sex: male Admission Date (Current Location): 02/24/2023  [facility] and [place] Number:  Producer, television/film/video and Address:  [facility], [address] [place], [place] [address]      Provider Number: [id number]  Attending Physician Name and Address:  Lorin Glass, MD  Relative Name and Phone Number:       Current Level of Care: Hospital Recommended Level of Care: Skilled Nursing Facility Prior Approval Number:    Date Approved/Denied:   PASRR Number: [id number]  Discharge Plan: SNF    Current Diagnoses: Patient Active Problem List   Diagnosis Date Noted   Subacute osteomyelitis of right tibia 02/27/2023   Dehiscence of amputation stump of right lower extremity 02/27/2023   Uncontrolled type 2 diabetes mellitus with hyperglycemia, without long-term current use of insulin 02/24/2023   Left buttock abscess 02/24/2023   Open wound of right shoulder 02/24/2023   Chronic prescription opiate use - sees pain clinic in [place], [place]. 02/24/2023   S/P BKA (below knee amputation) unilateral, right 01/24/2021   Tobacco use disorder 03/19/2020    Orientation RESPIRATION BLADDER Height & Weight     Time, Self, Place  Normal Continent Weight: 232 lb 12.9 oz (105.6 kg) Height:   (185.4 cm)  BEHAVIORAL SYMPTOMS/MOOD NEUROLOGICAL BOWEL NUTRITION STATUS      Continent Diet (Please see d/c summary)  AMBULATORY STATUS COMMUNICATION OF NEEDS Skin   Extensive Assist Verbally Surgical wounds, Skin abrasions, Bruising (Please see d/c summary)                       Personal Care Assistance Level of Assistance  Bathing, Dressing, Feeding Bathing Assistance: Limited assistance Feeding assistance: Independent Dressing Assistance: Limited assistance     Functional Limitations Info  Sight, Hearing, Speech Sight Info: Adequate Hearing  Info: Adequate Speech Info: Adequate    SPECIAL CARE FACTORS FREQUENCY  PT (By licensed PT), OT (By licensed OT)     PT Frequency: 5x weekely OT Frequency: 3x weekly            Contractures Contractures Info: Not present    Additional Factors Info  Code Status, Allergies Code Status Info: Full code Allergies Info: Acetaminophen           Current Medications (03/01/2023):  This is the current hospital active medication list Current Facility-Administered Medications  Medication Dose Route Frequency Provider Last Rate Last Admin   acetaminophen (TYLENOL) tablet 325-650 mg  325-650 mg Oral Q6H PRN Nadara Mustard, MD       alum & mag hydroxide-simeth (MAALOX/MYLANTA) 200-200-20 MG/5ML suspension 15-30 mL  15-30 mL Oral Q2H PRN Nadara Mustard, MD       ascorbic acid (VITAMIN C) tablet 1,000 mg  1,000 mg Oral Daily Nadara Mustard, MD   1,000 mg at 03/01/23 0843   bisacodyl (DULCOLAX) EC tablet 5 mg  5 mg Oral Daily PRN Nadara Mustard, MD       cefTRIAXone (ROCEPHIN) 2 g in sodium chloride 0.9 % 100 mL IVPB  2 g Intravenous Q24H Nadara Mustard, MD   Stopped at 02/28/23 1447   docusate sodium (COLACE) capsule 100 mg  100 mg Oral Daily Nadara Mustard, MD   100 mg at 03/01/23 0843   gabapentin (NEURONTIN) capsule 800 mg  800 mg Oral TID Nadara Mustard,  MD   800 mg at 03/01/23 0843   guaiFENesin-dextromethorphan (ROBITUSSIN DM) 100-10 MG/5ML syrup 15 mL  15 mL Oral Q4H PRN Nadara Mustard, MD       heparin injection 5,000 Units  5,000 Units Subcutaneous Q8H Nadara Mustard, MD   5,000 Units at 03/01/23 1610   hydrALAZINE (APRESOLINE) injection 5 mg  5 mg Intravenous Q20 Min PRN Nadara Mustard, MD       HYDROmorphone (DILAUDID) injection 0.5-1 mg  0.5-1 mg Intravenous Q4H PRN Nadara Mustard, MD   1 mg at 03/01/23 0553   insulin aspart (novoLOG) injection 0-15 Units  0-15 Units Subcutaneous TID WC Nadara Mustard, MD   3 Units at 03/01/23 9604   insulin aspart (novoLOG) injection 0-5 Units   0-5 Units Subcutaneous QHS Nadara Mustard, MD   4 Units at 02/28/23 2207   insulin aspart (novoLOG) injection 5 Units  5 Units Subcutaneous TID WC Lorin Glass, MD   5 Units at 03/01/23 1238   insulin glargine-yfgn (SEMGLEE) injection 10 Units  10 Units Subcutaneous Once Nadara Mustard, MD       insulin glargine-yfgn [facility]) injection 35 Units  35 Units Subcutaneous QHS Lorin Glass, MD   35 Units at 02/28/23 2311   labetalol (NORMODYNE) injection 10 mg  10 mg Intravenous Q10 min PRN Nadara Mustard, MD       leptospermum manuka honey (MEDIHONEY) paste 1 Application  1 Application Topical Daily Nadara Mustard, MD   1 Application at 02/26/23 1006   leptospermum manuka honey (MEDIHONEY) paste 1 Application  1 Application Topical Daily Nadara Mustard, MD       magnesium citrate solution 1 Bottle  1 Bottle Oral Once PRN Nadara Mustard, MD       magnesium sulfate IVPB 2 g 50 mL  2 g Intravenous Daily PRN Nadara Mustard, MD       metoprolol tartrate (LOPRESSOR) injection 2-5 mg  2-5 mg Intravenous Q2H PRN Nadara Mustard, MD       nutrition supplement (JUVEN) (JUVEN) powder packet 1 packet  1 packet Oral BID BM Nadara Mustard, MD   1 packet at 03/01/23 0844   ondansetron (ZOFRAN) injection 4 mg  4 mg Intravenous Q6H PRN Nadara Mustard, MD       ondansetron [facility]) tablet 4 mg  4 mg Oral Q6H PRN Nadara Mustard, MD       oxyCODONE (Oxy IR/ROXICODONE) immediate release tablet 10-15 mg  10-15 mg Oral Q4H PRN Nadara Mustard, MD   15 mg at 03/01/23 0148   oxyCODONE (Oxy IR/ROXICODONE) immediate release tablet 15 mg  15 mg Oral Q3H PRN Nadara Mustard, MD   15 mg at 03/01/23 1209   oxyCODONE (Oxy IR/ROXICODONE) immediate release tablet 5-10 mg  5-10 mg Oral Q4H PRN Nadara Mustard, MD       pantoprazole (PROTONIX) EC tablet 40 mg  40 mg Oral Daily Nadara Mustard, MD   40 mg at 03/01/23 0843   phenol (CHLORASEPTIC) mouth spray 1 spray  1 spray Mouth/Throat PRN Nadara Mustard, MD       polyethylene glycol  (MIRALAX / GLYCOLAX) packet 17 g  17 g Oral Daily PRN Nadara Mustard, MD       potassium chloride SA (KLOR-CON M) CR tablet 20-40 mEq  20-40 mEq Oral Daily PRN Nadara Mustard, MD       vancomycin [facility]) IVPB  1250 mg/250 mL  1,250 mg Intravenous Q12H Nadara Mustard, MD 166.7 mL/hr at 03/01/23 0605 1,250 mg at 03/01/23 1191   Zinc Oxide (TRIPLE PASTE) 12.8 % ointment   Topical BID Nadara Mustard, MD   Given at 02/27/23 4782   zinc sulfate capsule 220 mg  220 mg Oral Daily Nadara Mustard, MD   220 mg at 03/01/23 9562     Discharge Medications: Please see discharge summary for a list of discharge medications.  Relevant Imaging Results:  Relevant Lab Results:   Additional Information    Helene Kelp, LCSW

## 2023-03-01 NOTE — Progress Notes (Signed)
Patient refused to have finger stick for CBG Q4 at 1130AM. Made the charge nurse and provider aware (at bed side).  Refused to change dressing.

## 2023-03-01 NOTE — Progress Notes (Signed)
SCD placed on LLE per order . Pt reused incentive spirometry.

## 2023-03-02 ENCOUNTER — Other Ambulatory Visit (HOSPITAL_COMMUNITY): Payer: Self-pay

## 2023-03-02 ENCOUNTER — Telehealth (HOSPITAL_COMMUNITY): Payer: Self-pay | Admitting: Pharmacy Technician

## 2023-03-02 ENCOUNTER — Encounter (HOSPITAL_COMMUNITY): Payer: Self-pay | Admitting: Orthopedic Surgery

## 2023-03-02 DIAGNOSIS — E1165 Type 2 diabetes mellitus with hyperglycemia: Secondary | ICD-10-CM | POA: Diagnosis not present

## 2023-03-02 LAB — BASIC METABOLIC PANEL
Anion gap: 8 (ref 5–15)
BUN: 23 mg/dL — ABNORMAL HIGH (ref 6–20)
CO2: 25 mmol/L (ref 22–32)
Calcium: 8.7 mg/dL — ABNORMAL LOW (ref 8.9–10.3)
Chloride: 100 mmol/L (ref 98–111)
Creatinine, Ser: 1.06 mg/dL (ref 0.61–1.24)
GFR, Estimated: >60 mL/min (ref >60)
Glucose, Bld: 235 mg/dL — ABNORMAL HIGH (ref 70–99)
Potassium: 4 mmol/L (ref 3.5–5.1)
Sodium: 133 mmol/L — ABNORMAL LOW (ref 135–145)

## 2023-03-02 LAB — CBC
HCT: 30.1 % — ABNORMAL LOW (ref 39.0–52.0)
Hemoglobin: 10.2 g/dL — ABNORMAL LOW (ref 13.0–17.0)
MCH: 29.6 pg (ref 26.0–34.0)
MCHC: 33.9 g/dL (ref 30.0–36.0)
MCV: 87.2 fL (ref 80.0–100.0)
Platelets: 441 10*3/uL — ABNORMAL HIGH (ref 150–400)
RBC: 3.45 MIL/uL — ABNORMAL LOW (ref 4.22–5.81)
RDW: 13.4 % (ref 11.5–15.5)
WBC: 9.8 10*3/uL (ref 4.0–10.5)
nRBC: 0 % (ref 0.0–0.2)

## 2023-03-02 LAB — VANCOMYCIN, PEAK: Vancomycin Pk: 40 ug/mL (ref 30–40)

## 2023-03-02 LAB — GLUCOSE, CAPILLARY
Glucose-Capillary: 464 mg/dL — ABNORMAL HIGH (ref 70–99)
Glucose-Capillary: 157 mg/dL — ABNORMAL HIGH (ref 70–99)

## 2023-03-02 LAB — AEROBIC/ANAEROBIC CULTURE W GRAM STAIN (SURGICAL/DEEP WOUND)

## 2023-03-02 MED ORDER — INSULIN ASPART 100 UNIT/ML IJ SOLN
8.0000 [IU] | Freq: Three times a day (TID) | INTRAMUSCULAR | Status: DC
  Administered 2023-03-02 – 2023-03-04 (×6): 8 [IU] via SUBCUTANEOUS

## 2023-03-02 NOTE — Telephone Encounter (Signed)
Patient Advocate Encounter  Prior Authorization for [employer]  has been approved.    PA# [id number] Insurance [employer] Medicare Part D Electronic Prior Authorization Form  Effective dates: 03/02/2023 through 11/10/2023  Patients co-pay is $0.00.     Roland Earl, CPhT Pharmacy Patient Advocate Specialist [facility] Patient Advocate Team Direct Number: [phone]  Fax: [phone]

## 2023-03-02 NOTE — Progress Notes (Addendum)
PROGRESS NOTE  Ryan [name]  DOB: [birth year]  PCP: Hortencia Conradi, NP JXB:[id number]  DOA: 02/24/2023  LOS: 5 days  Hospital Day: 7  Brief narrative: Ryan [name] is a 57 y.o. male with PMH significant for uncontrolled DM2, right leg osteomyelitis s/p right BKA 2022, chronic pain syndrome on chronic opiates, chronic smoker 4/16, patient presented to the ED with complaint of multiple wounds on his right shoulder, right leg, left buttocks Patient seems to be noncompliant to follow ups and medications except for his pain medicines. Few weeks ago, patient noticed a boil on his right shoulder which got worse.  Few days ago, he noticed a similar boil on the right buttock.  He also noticed a sore spot on his right BKA stump that is not draining.  In the ED, afebrile, hemodynamically stable Initial labs with WBC count 11.9, hemoglobin 11.4, lactic acid 3, sodium 127, glucose elevated at 648 CRP 11.2, ESR elevated to 113 Urine ketones negative Right femur x-ray showed below-knee amputation.  No acute osseous lesions. Admitted to [facility] General surgery and orthopedics were consulted. 4/19, he had a combined procedure by draining which he underwent I&D and sharp debridement of right posterior shoulder and left buttock abscess followed by revision of right BKA. See below for details.  Subjective: Patient was seen and examined this morning. Sitting up at the edge of the bed.  Not in distress. He got CBC monitor placed today.  Assessment and plan: Left buttock abscess Right upper back/shoulder wound IND of both wounds by general surgery on 4/19 Wet-to-dry dressing change per general surgery. OR cultures growing MRSA. Currently on IV Rocephin and IV vancomycin. General surgery following for wound dressing.  Right BKA stump cellulitis/drainage 4/19, revision of right BKA by Dr. Lajoyce Corners.  Tissue culture and bone culture pending.  Wound VAC in place.  Patient may be discharged with a  Prevena plus portable wound VAC pump at discharge. Recommended against this or kneeling scooter until the skin is healed.   Type 2 diabetes mellitus uncontrolled with hyperglycemia A1c uncontrolled, more than 15 Not taking any medicines at home Sugar level running elevated over 300. Currently on Lantus to 35 units, scheduled Premeal aspart 5 units 3 times daily along with sliding scale insulin Blood sugar level running elevated.  I increased Premeal aspart 18 units 3 times daily today.  CBG monitoring started today. Recent Labs  Lab 02/28/23 1714 02/28/23 2018 03/01/23 0816 03/01/23 2135 03/02/23 0812  GLUCAP 299* 338* 178* 343* 157*   Chronic pain syndrome  Chronic opiate use  sees pain clinic in [place], [place]. PTA on Neurontin and oxycodone 15 mg 7 times a day Continue same. Also on as needed IV Dilaudid for severe pain.  Tobacco use disorder Continues to smoke 1 pack a day.   Left lower extremity swelling Patient with pretty significant left lower extremity edema. Start Lasix 40 mg IV daily. Obtain echocardiogram and ultrasound duplex lower left lower extremity Recent Labs    02/24/23 1735 02/25/23 0429 03/02/23 0331  BUN 17 13 23*  CREATININE 1.13 0.87 1.06      Mobility: Was using prosthesis on the right amputation stump.  PT eval pending.  Goals of care   Code Status: Full Code     DVT prophylaxis:  SCD's Start: 02/27/23 1342 heparin injection 5,000 Units Start: 02/25/23 0100 SCDs Start: 02/25/23 0045   Antimicrobials: IV Rocephin, IV vancomycin Fluid: Encourage oral hydration.  Not on IV fluid Consultants: General surgery, orthopedics  Family Communication: None at bedside  Status: Inpatient Level of care:  Med-Surg   Needs to continue in-hospital care:  MRSA growing in OR culture.  Patient from: Home Anticipated d/c to: Pending clinical course.    Diet:  Diet Order             Diet Carb Modified Fluid consistency: Thin; Room service  appropriate? Yes  Diet effective now                   Scheduled Meds:  vitamin C  1,000 mg Oral Daily   docusate sodium  100 mg Oral Daily   gabapentin  800 mg Oral TID   heparin  5,000 Units Subcutaneous Q8H   insulin aspart  0-15 Units Subcutaneous TID WC   insulin aspart  0-5 Units Subcutaneous QHS   insulin aspart  8 Units Subcutaneous TID WC   insulin glargine-yfgn  10 Units Subcutaneous Once   insulin glargine-yfgn  35 Units Subcutaneous QHS   leptospermum manuka honey  1 Application Topical Daily   leptospermum manuka honey  1 Application Topical Daily   nutrition supplement (JUVEN)  1 packet Oral BID BM   pantoprazole  40 mg Oral Daily   Zinc Oxide   Topical BID   zinc sulfate  220 mg Oral Daily    PRN meds: acetaminophen, alum & mag hydroxide-simeth, bisacodyl, guaiFENesin-dextromethorphan, hydrALAZINE, HYDROmorphone (DILAUDID) injection, labetalol, magnesium citrate, magnesium sulfate bolus IVPB, metoprolol tartrate, ondansetron, ondansetron **OR** [DISCONTINUED] ondansetron (ZOFRAN) IV, oxyCODONE, oxyCODONE, oxyCODONE, phenol, polyethylene glycol, potassium chloride   Infusions:   cefTRIAXone (ROCEPHIN)  IV 2 g (03/02/23 1356)   magnesium sulfate bolus IVPB     vancomycin 1,250 mg (03/02/23 0639)    Antimicrobials: Anti-infectives (From admission, onward)    Start     Dose/Rate Route Frequency Ordered Stop   02/27/23 1430  ceFAZolin (ANCEF) IVPB 2g/100 mL premix  Status:  Discontinued        2 g 200 mL/hr over 30 Minutes Intravenous Every 8 hours 02/27/23 1341 02/27/23 1416   02/27/23 1130  ceFAZolin (ANCEF) IVPB 2g/100 mL premix        2 g 200 mL/hr over 30 Minutes Intravenous On call to O.R. 02/26/23 1840 02/27/23 1049   02/27/23 1128  vancomycin (VANCOCIN) powder  Status:  Discontinued          As needed 02/27/23 1148 02/27/23 1155   02/25/23 1530  vancomycin (VANCOREADY) IVPB 1250 mg/250 mL        1,250 mg 166.7 mL/hr over 90 Minutes Intravenous Every  12 hours 02/25/23 1430     02/25/23 1430  cefTRIAXone (ROCEPHIN) 2 g in sodium chloride 0.9 % 100 mL IVPB        2 g 200 mL/hr over 30 Minutes Intravenous Every 24 hours 02/25/23 1339     02/25/23 0900  vancomycin (VANCOCIN) IVPB 1000 mg/200 mL premix  Status:  Discontinued        1,000 mg 200 mL/hr over 60 Minutes Intravenous Every 12 hours 02/24/23 2323 02/25/23 0010   02/25/23 0600  cephALEXin (KEFLEX) capsule 500 mg  Status:  Discontinued        500 mg Oral Every 8 hours 02/25/23 0010 02/25/23 1339   02/24/23 1930  vancomycin (VANCOCIN) IVPB 1000 mg/200 mL premix  Status:  Discontinued        1,000 mg 200 mL/hr over 60 Minutes Intravenous  Once 02/24/23 1921 02/24/23 1926   02/24/23 1930  cefTRIAXone (ROCEPHIN) 2  g in sodium chloride 0.9 % 100 mL IVPB        2 g 200 mL/hr over 30 Minutes Intravenous  Once 02/24/23 1921 02/24/23 2058   02/24/23 1930  vancomycin (VANCOREADY) IVPB 2000 mg/400 mL        2,000 mg 200 mL/hr over 120 Minutes Intravenous  Once 02/24/23 1926 02/24/23 2311       Nutritional status:  Body mass index is 32.84 kg/m.          Objective: Vitals:   03/02/23 0814 03/02/23 1542  BP: 120/66 130/67  Pulse: 92 100  Resp: 18 18  Temp: 98.1 F (36.7 C) 98.1 F (36.7 C)  SpO2: 99% 100%    Intake/Output Summary (Last 24 hours) at 03/02/2023 1609 Last data filed at 03/02/2023 1544 Gross per 24 hour  Intake 1755.32 ml  Output 1450 ml  Net 305.32 ml   Filed Weights   02/27/23 0835 03/01/23 0500 03/02/23 0608  Weight: 105 kg 105.6 kg 112.9 kg   Weight change: 7.3 kg Body mass index is 32.84 kg/m.   Physical Exam: General exam: Middle-aged Caucasian male. Pain partially controlled.  Not in distress Skin: No rashes, lesions or ulcers. HEENT: Atraumatic, normocephalic, no obvious bleeding Lungs: Clear to auscultation bilaterally CVS: Regular rate and rhythm, no murmur GI/Abd soft, nontender, nondistended, bowel sound present CNS: Alert, awake, slow  to respond, oriented x 3 Psychiatry: Mood appropriate.  Not so conversational. Extremities: Right BKA revision with bandage to stump. Right shoulder area with postsurgical status.  Data Review: I have personally reviewed the laboratory data and studies available.  F/u labs ordered Unresulted Labs (From admission, onward)     Start     Ordered   03/03/23 0530  Vancomycin, trough  Once-Timed,   TIMED       Question:  Specimen collection method  Answer:  Lab=Lab collect  See Hyperspace for full Linked Orders Report.   03/02/23 1229   03/02/23 2130  Vancomycin, peak  Once-Timed,   TIMED       Question:  Specimen collection method  Answer:  Lab=Lab collect   03/02/23 1229   03/02/23 0500  CBC  Daily,   R     Question:  Specimen collection method  Answer:  Lab=Lab collect   03/01/23 1257   03/02/23 0500  Basic metabolic panel  Daily,   R     Question:  Specimen collection method  Answer:  Lab=Lab collect   03/01/23 1257   02/24/23 2347  Rapid urine drug screen (hospital performed)  Add-on,   AD        02/24/23 2346            Total time spent in review of labs and imaging, patient evaluation, formulation of plan, documentation and communication with family: 45 minutes  Signed, Lorin Glass, MD [facility] 03/02/2023

## 2023-03-02 NOTE — Progress Notes (Signed)
PT Cancellation Note  Patient Details Name: Ryan [name] MRN: [id number] DOB: 30-Aug-1966   Cancelled Treatment:    Reason Eval/Treat Not Completed: Other (comment) (Pt asked PT to come back after he gets a nap this pm. will reattempt as able.)   [name] 03/02/2023, 2:00 PM Travian Kerner M,PT Acute Rehab Services [phone]

## 2023-03-02 NOTE — Inpatient Diabetes Management (Addendum)
Inpatient Diabetes Program Recommendations  AACE/ADA: New Consensus Statement on Inpatient Glycemic Control   Target Ranges:  Prepandial:   less than 140 mg/dL      Peak postprandial:   less than 180 mg/dL (1-2 hours)      Critically ill patients:  140 - 180 mg/dL    Latest Reference Range & Units 03/01/23 08:16 03/01/23 21:35 03/02/23 08:12  Glucose-Capillary 70 - 99 mg/dL 161 (H) 096 (H) 045 (H)   Review of Glycemic Control  Diabetes history: DM2 Outpatient Diabetes medications: None listed; had been on Trulicity, Tresiba, and Fiasp insulin in the past Current orders for Inpatient glycemic control: Semglee 35 units QHS, Novolog 5 units TID with meals, Novolog 0-15 units TID with meals, Novolog 0-5 units QHS  Inpatient Diabetes Program Recommendations:    Insulin: If post prandial glucose is consistently over 180 mg/dl, please consider increasing meal coverage to Novolog 8 units TID with meals.  NURSING: Please use the Dexcom G7 CGM to obtain glucose values. Please be sure to chart CGM glucose  AC&HS in Flowsheets under "CGM Device".   Outpatient DM medications: Per Roland Earl, CPhT with outpatient [facility] pharmacy: [name], and Humalog and they are all $0 copay. FreeStyle Libre3 and Dexcom G7 both require prior authorization.  Please provide Rx for Trulicity, Tresiba, Humalog Flexpens, and insulin pen needles. Also, please provide Rx for Dexcom G7 sensors (#[id number]) as well.   NOTE: Noted patient is refusing finger stick glucose at times. Dr. Pola Corn would like for patient to get a CGM while inpatient so it could be used for glucose monitoring while inpatient. Spoke with patient at bedside. Patient has 2 different android phones at bedside. Neither phone is compatible with the FreeStyle Libre3 nor the Dexcom G7 CGM. Inpatient diabetes team had 1 Dexcom G7 reader so it was decided to use the Dexcom G7 sensor on the patient so that the reader could be used to read it.  Educated patient  on Dexcom G7 CGM regarding application and changing CGM sensor (alternate every 10 days on back of arms), 30 minute warm-up, how to pair sensor to start a new sensor, and how to use reader device to check glucose.   Patient has been given 2 Dexcom G7 sensor samples. Assisted patient to apply Dexcom G7 sensor to back of left arm at 10:45 am.  Informed patient that Dr. Pola Corn has given permission to use the CGM glucose while inpatient since he is refusing finger sticks and we need the glucose information to get DM under better control. Patient is aware that he will need to show nursing staff what his glucose is from the [facility] G7 reader device.  Informed patient that it would be requested that attending provider provide Rx for Dexcom G7 sensors at discharge. Asked patient to be sure to let PCP know about Dexcom G7 and allow provider to review reports from [facility] G7 reader so the provider can use the information to continue to make adjustments with DM medications if needed. Patient verbalized understanding of information and has no questions at this time.  Thanks, Orlando Penner, RN, MSN, CDCES Diabetes Coordinator Inpatient Diabetes Program [phone] (Team Pager from 8am to 5pm)

## 2023-03-02 NOTE — Progress Notes (Signed)
Patient ID: Ryan [name], male   DOB: Oct 29, 1966, 57 y.o.   MRN: [id number] Patient is seen in follow-up 3 days status post revision right below-knee amputation.  Cultures are positive for staph.  Anticipate patient will be able to be discharged on a month of oral antibiotics.  Patient has the limb guard in place.  Patient should benefit from inpatient rehab.  Patient will discharge with the Praveena plus portable wound VAC pump.  Discussed the importance of nonweightbearing on the residual limb for 3 weeks.  Discussed the importance of working on active knee extension.  Patient states he is interested in an I walk device.  Recommended against this or kneeling scooter until the skin is healed.

## 2023-03-02 NOTE — Progress Notes (Signed)
PT Cancellation Note  Patient Details Name: Ryan [name] MRN: [id number] DOB: Aug 11, 1966   Cancelled Treatment:    Reason Eval/Treat Not Completed: Other (comment) (Education being done with pt by nurse and she said it would take some time.  Will check back as able.)   [name] 03/02/2023, 11:10 AM Tibor Lemmons M,PT Acute Rehab Services [phone]

## 2023-03-02 NOTE — Progress Notes (Addendum)
[facility] Progress Note  3 Days Post-Op  Subjective: CC:  Does not like blood glucose checks. Denies pain at rest.  At baseline he lives with a friend, says he has no help for wound care at discharge   Objective: Vital signs in last 24 hours: Temp:  [98.1 F (36.7 C)-98.6 F (37 C)] 98.1 F (36.7 C) (04/22 0413) Pulse Rate:  [89-90] 89 (04/22 0413) Resp:  [17-18] 17 (04/22 0413) BP: (132-143)/(71-87) 132/71 (04/22 0413) SpO2:  [100 %] 100 % (04/22 0413) Weight:  [112.9 kg] 112.9 kg (04/22 1610) Last BM Date : 02/27/23  Intake/Output from previous day: 04/21 0701 - 04/22 0700 In: 1544.3 [P.O.:1040; IV Piggyback:504.3] Out: 2950 [Urine:2950] Intake/Output this shift: Total I/O In: -  Out: 550 [Urine:550]  PE: Gen:  Alert, NAD, pleasant Card:  Regular rate and rhythm Pulm:  Normal effort ORA Abd: Soft, non-tender, non-distended Skin:  R shoulder - some chronic skin changes around the surgical wound which has a clean base. Small amt fibrinous exudate perimeter of wound. No fluctuance. Mild induration    L buttock would - 2x2x2 cm   Psych: A&Ox3   Lab Results:  Recent Labs    03/02/23 0331  WBC 9.8  HGB 10.2*  HCT 30.1*  PLT 441*   BMET Recent Labs    03/02/23 0331  NA 133*  K 4.0  CL 100  CO2 25  GLUCOSE 235*  BUN 23*  CREATININE 1.06  CALCIUM 8.7*   PT/INR No results for input(s): "LABPROT", "INR" in the last 72 hours. CMP     Component Value Date/Time   NA 133 (L) 03/02/2023 0331   K 4.0 03/02/2023 0331   CL 100 03/02/2023 0331   CO2 25 03/02/2023 0331   GLUCOSE 235 (H) 03/02/2023 0331   BUN 23 (H) 03/02/2023 0331   CREATININE 1.06 03/02/2023 0331   CALCIUM 8.7 (L) 03/02/2023 0331   PROT 6.1 (L) 02/25/2023 0429   ALBUMIN 1.8 (L) 02/25/2023 0429   AST 13 (L) 02/25/2023 0429   ALT 12 02/25/2023 0429   ALKPHOS 103 02/25/2023 0429   BILITOT 0.4 02/25/2023 0429   GFRNONAA >60 03/02/2023 0331   GFRAA >90 04/02/2012 0948    Lipase  No results found for: "LIPASE"     Studies/Results: No results found.  Anti-infectives: Anti-infectives (From admission, onward)    Start     Dose/Rate Route Frequency Ordered Stop   02/27/23 1430  ceFAZolin (ANCEF) IVPB 2g/100 mL premix  Status:  Discontinued        2 g 200 mL/hr over 30 Minutes Intravenous Every 8 hours 02/27/23 1341 02/27/23 1416   02/27/23 1130  ceFAZolin (ANCEF) IVPB 2g/100 mL premix        2 g 200 mL/hr over 30 Minutes Intravenous On call to O.R. 02/26/23 1840 02/27/23 1049   02/27/23 1128  vancomycin (VANCOCIN) powder  Status:  Discontinued          As needed 02/27/23 1148 02/27/23 1155   02/25/23 1530  vancomycin (VANCOREADY) IVPB 1250 mg/250 mL        1,250 mg 166.7 mL/hr over 90 Minutes Intravenous Every 12 hours 02/25/23 1430     02/25/23 1430  cefTRIAXone (ROCEPHIN) 2 g in sodium chloride 0.9 % 100 mL IVPB        2 g 200 mL/hr over 30 Minutes Intravenous Every 24 hours 02/25/23 1339     02/25/23 0900  vancomycin (VANCOCIN) IVPB 1000 mg/200 mL premix  Status:  Discontinued        1,000 mg 200 mL/hr over 60 Minutes Intravenous Every 12 hours 02/24/23 2323 02/25/23 0010   02/25/23 0600  cephALEXin (KEFLEX) capsule 500 mg  Status:  Discontinued        500 mg Oral Every 8 hours 02/25/23 0010 02/25/23 1339   02/24/23 1930  vancomycin (VANCOCIN) IVPB 1000 mg/200 mL premix  Status:  Discontinued        1,000 mg 200 mL/hr over 60 Minutes Intravenous  Once 02/24/23 1921 02/24/23 1926   02/24/23 1930  cefTRIAXone (ROCEPHIN) 2 g in sodium chloride 0.9 % 100 mL IVPB        2 g 200 mL/hr over 30 Minutes Intravenous  Once 02/24/23 1921 02/24/23 2058   02/24/23 1930  vancomycin (VANCOREADY) IVPB 2000 mg/400 mL        2,000 mg 200 mL/hr over 120 Minutes Intravenous  Once 02/24/23 1926 02/24/23 2311        Assessment/Plan  Left Buttock abscess Right upper back/shoulder wound  POD 3 debridement and drainage of both sites.   - afebrile, WBC 9.8  (from 13  5d ago) - wounds overall clean. No fluctuance or expanding cellulitis. Mild induration. No further surgical needs. Continue TWICE DAILY dressing changes. Patient may shower from CCS standpoint. I will arrange outpatient follow up for wound check in about 3 weeks. CCS will sign off.   BKA stump per Dr. Lajoyce Corners, Cx staph aureus    FEN: Carb mod   ID: ceftriaxone/vancomycin VTE: heparin subq   Per primary T2DM Chronic pain Tobacco use   LOS: 5 days   I reviewed nursing notes, Consultant ortho notes, last 24 h vitals and pain scores, last 48 h intake and output, last 24 h labs and trends, and last 24 h imaging results.    Hosie Spangle, PA-C [facility] Please see Amion for pager number during day hours 7:00am-4:30pm

## 2023-03-02 NOTE — Telephone Encounter (Signed)
Patient Advocate Encounter   Received notification that prior authorization for Dexcom G7 Sensor is required.   PA submitted on 03/02/2023 Key [id number] Insurance [employer] Medicare Part D Electronic Prior Authorization Form Status is pending       Roland Earl, CPhT Pharmacy Patient Advocate Specialist [facility] Patient Advocate Team Direct Number: [phone]  Fax: [phone]

## 2023-03-02 NOTE — Progress Notes (Signed)
Pt refused wound care.  

## 2023-03-02 NOTE — Progress Notes (Signed)
Inpatient Rehab Admissions Coordinator:    CIR consult received. Note PT/OT are recommending SNF for Pt. He also has a payor that is unlikely to approve  CIR for an amputation. I will not pursue CIR admit for this Pt. Recommend TOC look at SNF for this Pt, per therapy recommendations.  Megan Salon, MS, CCC-SLP Rehab Admissions Coordinator  [phone] (celll) [phone] (office)

## 2023-03-03 ENCOUNTER — Inpatient Hospital Stay (HOSPITAL_COMMUNITY): Payer: 59

## 2023-03-03 DIAGNOSIS — R9431 Abnormal electrocardiogram [ECG] [EKG]: Secondary | ICD-10-CM

## 2023-03-03 DIAGNOSIS — Z794 Long term (current) use of insulin: Secondary | ICD-10-CM

## 2023-03-03 DIAGNOSIS — E1165 Type 2 diabetes mellitus with hyperglycemia: Secondary | ICD-10-CM | POA: Diagnosis not present

## 2023-03-03 DIAGNOSIS — M7989 Other specified soft tissue disorders: Secondary | ICD-10-CM

## 2023-03-03 LAB — BASIC METABOLIC PANEL
Anion gap: 8 (ref 5–15)
BUN: 26 mg/dL — ABNORMAL HIGH (ref 6–20)
CO2: 24 mmol/L (ref 22–32)
Calcium: 8.6 mg/dL — ABNORMAL LOW (ref 8.9–10.3)
Chloride: 100 mmol/L (ref 98–111)
Creatinine, Ser: 1.09 mg/dL (ref 0.61–1.24)
GFR, Estimated: >60 mL/min (ref >60)
Glucose, Bld: 114 mg/dL — ABNORMAL HIGH (ref 70–99)
Potassium: 3.5 mmol/L (ref 3.5–5.1)
Sodium: 132 mmol/L — ABNORMAL LOW (ref 135–145)

## 2023-03-03 LAB — CBC
HCT: 28.2 % — ABNORMAL LOW (ref 39.0–52.0)
Hemoglobin: 9.6 g/dL — ABNORMAL LOW (ref 13.0–17.0)
MCH: 30 pg (ref 26.0–34.0)
MCHC: 34 g/dL (ref 30.0–36.0)
MCV: 88.1 fL (ref 80.0–100.0)
Platelets: 422 10*3/uL — ABNORMAL HIGH (ref 150–400)
RBC: 3.2 MIL/uL — ABNORMAL LOW (ref 4.22–5.81)
RDW: 13.5 % (ref 11.5–15.5)
WBC: 10.8 10*3/uL — ABNORMAL HIGH (ref 4.0–10.5)
nRBC: 0 % (ref 0.0–0.2)

## 2023-03-03 LAB — ECHOCARDIOGRAM COMPLETE
AR max vel: 3.11 cm2
AV Peak grad: 7.5 mmHg
Ao pk vel: 1.37 m/s
Area-P 1/2: 4.36 cm2
Height: 73 in
S' Lateral: 3.2 cm
Weight: 3435.2 oz

## 2023-03-03 LAB — VANCOMYCIN, TROUGH: Vancomycin Tr: 17 ug/mL (ref 15–20)

## 2023-03-03 LAB — GLUCOSE, CAPILLARY: Glucose-Capillary: 368 mg/dL — ABNORMAL HIGH (ref 70–99)

## 2023-03-03 MED ORDER — VANCOMYCIN HCL 750 MG/150ML IV SOLN
750.0000 mg | Freq: Two times a day (BID) | INTRAVENOUS | Status: DC
  Filled 2023-03-03: qty 150

## 2023-03-03 MED ORDER — FUROSEMIDE 10 MG/ML IJ SOLN
40.0000 mg | Freq: Two times a day (BID) | INTRAMUSCULAR | Status: DC
  Administered 2023-03-03: 40 mg via INTRAVENOUS
  Filled 2023-03-03: qty 4

## 2023-03-03 MED ORDER — DOXYCYCLINE HYCLATE 100 MG PO TABS
100.0000 mg | ORAL_TABLET | Freq: Two times a day (BID) | ORAL | Status: DC
  Administered 2023-03-03 – 2023-03-07 (×8): 100 mg via ORAL
  Filled 2023-03-03 (×8): qty 1

## 2023-03-03 NOTE — Progress Notes (Signed)
Occupational Therapy Treatment Patient Details Name: Ryan [name] MRN: [id number] DOB: [birth year] Today's Date: 03/03/2023   History of present illness Pt is 57 y.o.M admitted on 02/24/23 due to cellulitis, abscesses and multiple wounds. 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   OT comments  Pt making good progress with functional goals. Pt standing at side of sink upon OT arrival looking. Pt sat EOB to simulate LB bathing and dressing tasks min guard A. Pt talked extensively (very distracted by) about him having a different limb guard brought to him to keep his leg straight to wear his prosthesis. OT will continue to follow acutely to maximize level of function and safety   Recommendations for follow up therapy are one component of a multi-disciplinary discharge planning process, led by the attending physician.  Recommendations may be updated based on patient status, additional functional criteria and insurance authorization.    Assistance Recommended at Discharge Frequent or constant Supervision/Assistance  Patient can return home with the following  A little help with bathing/dressing/bathroom;Help with stairs or ramp for entrance;Assistance with cooking/housework;A little help with walking and/or transfers   Equipment Recommendations  BSC/3in1;Other (comment)    Recommendations for Other Services      Precautions / Restrictions Precautions Precautions: Fall Restrictions Weight Bearing Restrictions: Yes RLE Weight Bearing: Non weight bearing       Mobility Bed Mobility               General bed mobility comments: Pt standing at side of sink upon arrival    Transfers Overall transfer level: Needs assistance Equipment used: Rollator (4 wheels) Transfers: Sit to/from Stand Sit to Stand: Min guard           General transfer  comment: Min guard for safety; cues for hand placement. Pt flexed with posture     Balance Overall balance assessment: Needs assistance Sitting-balance support: No upper extremity supported, Feet supported Sitting balance-Leahy Scale: Good     Standing balance support: Bilateral upper extremity supported, During functional activity Standing balance-Leahy Scale: Poor                             ADL either performed or assessed with clinical judgement   ADL Overall ADL's : Needs assistance/impaired     Grooming: Wash/dry hands;Wash/dry face;Standing;Min guard       Lower Body Bathing: Sitting/lateral leans;Min guard Lower Body Bathing Details (indicate cue type and reason): simulated     Lower Body Dressing: Min guard;Sitting/lateral leans   Toilet Transfer: Min guard;Ambulation;Rollator (4 wheels) Toilet Transfer Details (indicate cue type and reason): simulated Toileting- Clothing Manipulation and Hygiene: Min guard;Sit to/from stand       Functional mobility during ADLs: Diplomatic Services operational officer (4 wheels)      Extremity/Trunk Assessment Upper Extremity Assessment Upper Extremity Assessment: Overall WFL for tasks assessed   Lower Extremity Assessment Lower Extremity Assessment: Defer to PT evaluation        Vision Ability to See in Adequate Light: 0 Adequate Patient Visual Report: No change from baseline     Perception     Praxis      Cognition Arousal/Alertness: Awake/alert Behavior During Therapy: WFL for tasks assessed/performed Overall Cognitive Status: Within Functional Limits for tasks assessed  Exercises      Shoulder Instructions       General Comments      Pertinent Vitals/ Pain       Pain Assessment Pain Assessment: 0-10 Pain Score: 5  Pain Location: RLE, back and neck Pain Descriptors / Indicators: Aching, Discomfort Pain Intervention(s): Monitored during session,  Repositioned  Home Living                                          Prior Functioning/Environment              Frequency  Min 2X/week        Progress Toward Goals  OT Goals(current goals can now be found in the care plan section)  Progress towards OT goals: Progressing toward goals     Plan Discharge plan remains appropriate    Co-evaluation                 AM-PAC OT "6 Clicks" Daily Activity     Outcome Measure   Help from another person eating meals?: None Help from another person taking care of personal grooming?: A Little Help from another person toileting, which includes using toliet, bedpan, or urinal?: A Little Help from another person bathing (including washing, rinsing, drying)?: A Little   Help from another person to put on and taking off regular lower body clothing?: A Little 6 Click Score: 16    End of Session Equipment Utilized During Treatment: Rollator (4 wheels)  OT Visit Diagnosis: Unsteadiness on feet (R26.81);Other abnormalities of gait and mobility (R26.89);Muscle weakness (generalized) (M62.81)   Activity Tolerance Patient tolerated treatment well   Patient Left in bed;with call bell/phone within reach;Other (comment) (sitting EOB eating lunch)   Nurse Communication          Time: 5621-3086 OT Time Calculation (min): 18 min  Charges: OT General Charges $OT Visit: 1 Visit OT Treatments $Self Care/Home Management : 8-22 mins    Galen Manila 03/03/2023, 3:23 PM

## 2023-03-03 NOTE — Progress Notes (Signed)
Pharmacy Antibiotic Note  Ryan [name] is a 57 y.o. male admitted on 02/24/2023 with cellulitis and abscess.  Pharmacy has been consulted for vancomycin dosing. He has multiple wounds including a left buttock abscess, right shoulder wound, and a right BKA stump wound. These wounds have been draining pus as well.   Patient received vancomycin 2g IV x1 in the ED 4/16 ~2100 and has since continued on vancomycin and ceftriaxone. He is afebrile with no signs of hypotension. WBC has trended down from 11k to 10.8k.   General surgery performed I&D of the right shoulder and left buttock wound on 4/19. Orthopedic surgery performed a revision of the right BKA on 4/19 as well.   Tissue cultures growing MSSA and MRSE. D/W Ortho and TRH, will keep current vancomycin and CTX until at least after debridement planned for 4/24. Would recommend de-escalation to linezolid when appropriate.   Vancomycin levels drawn overnight. Peak 40, trough 17. Calculated AUC 748.7 (goal 400-600). Will decrease dose to 750 mg IV q12h  Plan: Decrease Vancomycin 750 mg IV q12 hours (eAUC 448) Continue Ceftriaxone 2g IV q24h per MD Follow up , renal function, vanc levels as necessary   Height:  (185.4 cm) Weight: 97.4 kg (214 lb 11.2 oz) (standing) IBW/kg (Calculated) : 79.9  Temp (24hrs), Avg:97.7 F (36.5 C), Min:97.5 F (36.4 C), Max:98.1 F (36.7 C)  Recent Labs  Lab 02/24/23 1735 02/25/23 0429 02/27/23 0455 03/02/23 0331 03/02/23 2136 03/03/23 0532  WBC 11.9* 13.2* 9.4 9.8  --  10.8*  CREATININE 1.13 0.87  --  1.06  --  1.09  LATICACIDVEN 3.0*  --   --   --   --   --   VANCOTROUGH  --   --   --   --   --  17  VANCOPEAK  --   --   --   --  40  --      Estimated Creatinine Clearance: 93 mL/min (by C-G formula based on SCr of 1.09 mg/dL).    Allergies  Allergen Reactions   Acetaminophen Nausea And Vomiting    Antimicrobials this admission: 4/16 ceftriaxone >> 4/16 vancomycin >>   Microbiology  results: 4/16 BCx: NG x 4 days  4/16 resp panel: negative  4/19: Deep wound Cx: MSSA, MRSE   Dearius Hoffmann A. Jeanella Craze, PharmD, BCPS, FNKF Clinical Pharmacist [place] Please utilize Amion for appropriate phone number to reach the unit pharmacist [facility])

## 2023-03-03 NOTE — Progress Notes (Signed)
PROGRESS NOTE  Ryan [name]  DOB: 1966/01/18  PCP: Hortencia Conradi, NP XBJ:[id number]  DOA: 02/24/2023  LOS: 6 days  Hospital Day: 8  Brief narrative: Ryan [name] is a 57 y.o. male with PMH significant for uncontrolled DM2, right leg osteomyelitis s/p right BKA 2022, chronic pain syndrome on chronic opiates, chronic smoker 4/16, patient presented to the ED with complaint of multiple wounds on his right shoulder, right leg, left buttocks Patient seems to be noncompliant to follow ups and medications except for his pain medicines. Few weeks ago, patient noticed a boil on his right shoulder which got worse.  Few days ago, he noticed a similar boil on the right buttock.  He also noticed a sore spot on his right BKA stump that is not draining.  In the ED, afebrile, hemodynamically stable Initial labs with WBC count 11.9, hemoglobin 11.4, lactic acid 3, sodium 127, glucose elevated at 648 CRP 11.2, ESR elevated to 113 Urine ketones negative Right femur x-ray showed below-knee amputation.  No acute osseous lesions. Admitted to [facility] General surgery and orthopedics were consulted. 4/19, he had a combined procedure by draining which he underwent I&D and sharp debridement of right posterior shoulder and left buttock abscess followed by revision of right BKA. See below for details.  Subjective: Patient was seen and examined this morning. Sitting up at the edge of the bed.  Not in distress. Blood sugar level improving.  He is agreeable to 1 particular SNF -Labs are as well  Assessment and plan: Left buttock abscess Right upper back/shoulder wound IND of both wounds by general surgery on 4/19 Wet-to-dry dressing change per general surgery. OR cultures growing MRSA. Currently on IV Rocephin and IV vancomycin.  ID consulted. Per general surgery, wound dressing twice a day to continue.  Outpatient follow-up in 3 weeks  Right BKA stump cellulitis/drainage 4/19, revision of right BKA by  Dr. Lajoyce Corners.  Tissue culture and bone culture pending.  Wound VAC in place.  Patient may be discharged with a Prevena plus portable wound VAC pump at discharge. Recommended against this or kneeling scooter until the skin is healed.   Type 2 diabetes mellitus uncontrolled with hyperglycemia A1c uncontrolled, more than 15 Not taking any medicines at home Currently on Lantus to 35 units, scheduled Premeal aspart 18 units 3 times daily along with sliding scale insulin. Continue CBG monitoring Recent Labs  Lab 02/28/23 1714 02/28/23 2018 03/01/23 0816 03/01/23 2135 03/02/23 0812  GLUCAP 299* 338* 178* 343* 157*   Chronic pain syndrome  Chronic opiate use  sees pain clinic in [place], [place]. PTA on Neurontin and oxycodone 15 mg 7 times a day Continue same. Also on as needed IV Dilaudid for severe pain.  Tobacco use disorder Continues to smoke 1 pack a day.   Left lower extremity swelling Patient with pretty significant left lower extremity edema. US duplex scan negative for DVT.  Pending echocardiogram 4/23, start Lasix 40 mg IV twice daily. Obtain echocardiogram and ultrasound duplex lower left lower extremity Recent Labs    02/24/23 1735 02/25/23 0429 03/02/23 0331 03/03/23 0532  BUN 17 13 23* 26*  CREATININE 1.13 0.87 1.06 1.09      Mobility: Was using prosthesis on the right amputation stump.  PT eval pending.  Goals of care   Code Status: Full Code     DVT prophylaxis:  SCD's Start: 02/27/23 1342 heparin injection 5,000 Units Start: 02/25/23 0100 SCDs Start: 02/25/23 0045   Antimicrobials: IV Rocephin, IV  vancomycin Fluid: Encourage oral hydration.  Not on IV fluid Consultants: General surgery, orthopedics Family Communication: None at bedside  Status: Inpatient Level of care:  Med-Surg   Needs to continue in-hospital care:  MRSA growing in OR culture.  Patient from: Home Anticipated d/c to: Likely SNF   Diet:  Diet Order             Diet Carb  Modified Fluid consistency: Thin; Room service appropriate? Yes  Diet effective now                   Scheduled Meds:  vitamin C  1,000 mg Oral Daily   docusate sodium  100 mg Oral Daily   furosemide  40 mg Intravenous Q12H   gabapentin  800 mg Oral TID   heparin  5,000 Units Subcutaneous Q8H   insulin aspart  0-15 Units Subcutaneous TID WC   insulin aspart  0-5 Units Subcutaneous QHS   insulin aspart  8 Units Subcutaneous TID WC   insulin glargine-yfgn  10 Units Subcutaneous Once   insulin glargine-yfgn  35 Units Subcutaneous QHS   leptospermum manuka honey  1 Application Topical Daily   leptospermum manuka honey  1 Application Topical Daily   nutrition supplement (JUVEN)  1 packet Oral BID BM   pantoprazole  40 mg Oral Daily   Zinc Oxide   Topical BID   zinc sulfate  220 mg Oral Daily    PRN meds: acetaminophen, alum & mag hydroxide-simeth, bisacodyl, guaiFENesin-dextromethorphan, hydrALAZINE, HYDROmorphone (DILAUDID) injection, labetalol, magnesium citrate, magnesium sulfate bolus IVPB, metoprolol tartrate, ondansetron, ondansetron **OR** [DISCONTINUED] ondansetron (ZOFRAN) IV, oxyCODONE, phenol, polyethylene glycol, potassium chloride   Infusions:   cefTRIAXone (ROCEPHIN)  IV 2 g (03/02/23 1356)   magnesium sulfate bolus IVPB     vancomycin      Antimicrobials: Anti-infectives (From admission, onward)    Start     Dose/Rate Route Frequency Ordered Stop   03/03/23 1800  vancomycin (VANCOREADY) IVPB 750 mg/150 mL        750 mg 150 mL/hr over 60 Minutes Intravenous Every 12 hours 03/03/23 0824     02/27/23 1430  ceFAZolin (ANCEF) IVPB 2g/100 mL premix  Status:  Discontinued        2 g 200 mL/hr over 30 Minutes Intravenous Every 8 hours 02/27/23 1341 02/27/23 1416   02/27/23 1130  ceFAZolin (ANCEF) IVPB 2g/100 mL premix        2 g 200 mL/hr over 30 Minutes Intravenous On call to O.R. 02/26/23 1840 02/27/23 1049   02/27/23 1128  vancomycin (VANCOCIN) powder  Status:   Discontinued          As needed 02/27/23 1148 02/27/23 1155   02/25/23 1530  vancomycin (VANCOREADY) IVPB 1250 mg/250 mL  Status:  Discontinued        1,250 mg 166.7 mL/hr over 90 Minutes Intravenous Every 12 hours 02/25/23 1430 03/03/23 0824   02/25/23 1430  cefTRIAXone (ROCEPHIN) 2 g in sodium chloride 0.9 % 100 mL IVPB        2 g 200 mL/hr over 30 Minutes Intravenous Every 24 hours 02/25/23 1339     02/25/23 0900  vancomycin (VANCOCIN) IVPB 1000 mg/200 mL premix  Status:  Discontinued        1,000 mg 200 mL/hr over 60 Minutes Intravenous Every 12 hours 02/24/23 2323 02/25/23 0010   02/25/23 0600  cephALEXin (KEFLEX) capsule 500 mg  Status:  Discontinued        500 mg  Oral Every 8 hours 02/25/23 0010 02/25/23 1339   02/24/23 1930  vancomycin (VANCOCIN) IVPB 1000 mg/200 mL premix  Status:  Discontinued        1,000 mg 200 mL/hr over 60 Minutes Intravenous  Once 02/24/23 1921 02/24/23 1926   02/24/23 1930  cefTRIAXone (ROCEPHIN) 2 g in sodium chloride 0.9 % 100 mL IVPB        2 g 200 mL/hr over 30 Minutes Intravenous  Once 02/24/23 1921 02/24/23 2058   02/24/23 1930  vancomycin (VANCOREADY) IVPB 2000 mg/400 mL        2,000 mg 200 mL/hr over 120 Minutes Intravenous  Once 02/24/23 1926 02/24/23 2311       Nutritional status:  Body mass index is 28.33 kg/m.          Objective: Vitals:   03/03/23 0429 03/03/23 0812  BP: 124/76 123/75  Pulse: 97 91  Resp: 19 18  Temp: (!) 97.5 F (36.4 C) 97.7 F (36.5 C)  SpO2: 99% (!) 89%    Intake/Output Summary (Last 24 hours) at 03/03/2023 1246 Last data filed at 03/02/2023 2300 Gross per 24 hour  Intake 476 ml  Output --  Net 476 ml   Filed Weights   03/01/23 0500 03/02/23 0608 03/03/23 0445  Weight: 105.6 kg 112.9 kg 97.4 kg   Weight change: -15.5 kg Body mass index is 28.33 kg/m.   Physical Exam: General exam: Middle-aged Caucasian male. Pain partially controlled.  Not in distress Skin: No rashes, lesions or  ulcers. HEENT: Atraumatic, normocephalic, no obvious bleeding Lungs: Clear to auscultation bilaterally CVS: Regular rate and rhythm, no murmur GI/Abd soft, nontender, nondistended, bowel sound present CNS: Alert, awake, slow to respond, oriented x 3 Psychiatry: Mood appropriate.  Not so conversational. Extremities: Right BKA revision with bandage to stump. Right shoulder area with postsurgical status.  Left lower extremity 1-2+ pedal edema present  Data Review: I have personally reviewed the laboratory data and studies available.  F/u labs ordered Unresulted Labs (From admission, onward)     Start     Ordered   03/02/23 0500  CBC  Daily,   R     Question:  Specimen collection method  Answer:  Lab=Lab collect   03/01/23 1257   03/02/23 0500  Basic metabolic panel  Daily,   R     Question:  Specimen collection method  Answer:  Lab=Lab collect   03/01/23 1257            Total time spent in review of labs and imaging, patient evaluation, formulation of plan, documentation and communication with family: 45 minutes  Signed, Lorin Glass, MD [facility] 03/03/2023

## 2023-03-03 NOTE — Progress Notes (Signed)
PT Cancellation Note  Patient Details Name: LATRAVIS GRINE MRN: [id number] DOB: [birth year]   Cancelled Treatment:    Reason Eval/Treat Not Completed: Patient at procedure or test/unavailable - echo in room, PT to check back as able.   Marye Round, PT DPT Acute Rehabilitation Services Secure Chat Preferred  Office [phone]    Truddie Coco 03/03/2023, 12:28 PM

## 2023-03-03 NOTE — Consult Note (Signed)
[facility]    Date of Admission:  02/24/2023      Total days of antibiotics 6   Vancomycin               Reason for Consult: Skin and soft tissue infections / abscesses     Referring Provider: Dahal Primary Care Provider: Hortencia Conradi, NP   Assessment: Ryan [name] is a 57 y.o. male with multiple abscesses / cellulits of the skin in the setting of poorly controlled diabetes.   Abscess / Cellulitis - No findings of osteomyelitis and all seems deep SSTI. Cultures with MSSA and MRSE sensitive to tetracyclines. Would treat with 2 weeks since last debridement on 4/19 and finish out on doxycycline 100 mg PO BID. Can switch now. He needs follow up with surgical team and PCP outpatient. No need to follow with ID necessarily unless there are concerns.   Poorly controlled diabetes - Has not been able to get to medications and A1C > 14% - we talked about having his maintenance rx's sent to a mail order pharmacy (like [facility]) to help coordinate shipments. He has a majority of his care in [place] area.     Plan: Doreatha Martin out 2 weeks from last debridement with doxy 100 mg BID to cover recovered organisms Needs help for solution to get his medications  Glycemic control is a problem - needs outpatient FU.   FU outpatient with surgical teams and PCP  ID will sign off - please call back with any questions/concerns or if we can be of further assistance.      Principal Problem:   Uncontrolled type 2 diabetes mellitus with hyperglycemia, without long-term current use of insulin Active Problems:   S/P BKA (below knee amputation) unilateral, right   Tobacco use disorder   Left buttock abscess   Open wound of right shoulder   Chronic prescription opiate use - sees pain clinic in [place], [place].   Subacute osteomyelitis of right tibia   Dehiscence of amputation stump of right lower extremity    vitamin C  1,000 mg Oral Daily   docusate sodium   100 mg Oral Daily   furosemide  40 mg Intravenous BID   gabapentin  800 mg Oral TID   heparin  5,000 Units Subcutaneous Q8H   insulin aspart  0-15 Units Subcutaneous TID WC   insulin aspart  0-5 Units Subcutaneous QHS   insulin aspart  8 Units Subcutaneous TID WC   insulin glargine-yfgn  10 Units Subcutaneous Once   insulin glargine-yfgn  35 Units Subcutaneous QHS   leptospermum manuka honey  1 Application Topical Daily   leptospermum manuka honey  1 Application Topical Daily   nutrition supplement (JUVEN)  1 packet Oral BID BM   pantoprazole  40 mg Oral Daily   Zinc Oxide   Topical BID   zinc sulfate  220 mg Oral Daily    HPI: Ryan [name] is a 57 y.o. male here for management of multiple abscess sites including right thigh, buttocks, posterior shoulder, and Rt BKA stump.   He says he gets these abscesses from time to time that usually go away after he opens them up to drain. However they did not go away like they have in the past/   He says he has not had any of his diabetes medications and states he has no transportation available to him to make it to the doctors appointments  for follow up and cannot get to the pharmacy. He is a diabetic and uses a continuous glucose monitor at present and enjoying a coca cola and ice pop. He was admitted with a serum glucose of 648. WBC 11.4K. He has had surgical intervention for debridement on stump, shoulder and left buttock wound. The right buttock wound has spontaneously drained on it's own. He has worked with ID team in the past in [place] and required PICC lines in the past prior to his BKA.    Review of Systems: Review of Systems  Constitutional:  Negative for chills, diaphoresis, fever and malaise/fatigue.  Respiratory: Negative.    Cardiovascular: Negative.   Gastrointestinal: Negative.   Genitourinary: Negative.   Musculoskeletal:        Chronic stable back pain.   Skin:  Positive for rash (abscesses / cellulitis).    Past Medical  History:  Diagnosis Date   Arthritis    Asthma    seasonal allergies   Blood transfusion    Diabetes mellitus    GERD (gastroesophageal reflux disease)    occ   Sleep apnea    cpap used for 10 yrs. no recent sleep study    Social History   Tobacco Use   Smoking status: Every Day    Packs/day: 1.00    Years: 30.00    Additional pack years: 0.00    Total pack years: 30.00    Types: Cigarettes   Smokeless tobacco: Never   Tobacco comments:    Patient refuse  Vaping Use   Vaping Use: Never used  Substance Use Topics   Alcohol use: No   Drug use: No    History reviewed. No pertinent family history. Allergies  Allergen Reactions   Acetaminophen Nausea And Vomiting    OBJECTIVE: Blood pressure 123/75, pulse 91, temperature 97.7 F (36.5 C), temperature source Oral, resp. rate 18, height 6\' 1"  (1.854 m), weight 97.4 kg, SpO2 (!) 89 %.  Physical Exam Vitals reviewed.  Constitutional:      Appearance: Normal appearance. He is not ill-appearing.  HENT:     Mouth/Throat:     Mouth: Mucous membranes are moist.     Pharynx: No oropharyngeal exudate.  Eyes:     General: No scleral icterus.    Pupils: Pupils are equal, round, and reactive to light.  Cardiovascular:     Rate and Rhythm: Normal rate and regular rhythm.  Pulmonary:     Effort: Pulmonary effort is normal. No respiratory distress.  Abdominal:     General: Bowel sounds are normal. There is no distension.     Palpations: Abdomen is soft.  Skin:    General: Skin is warm and dry.     Capillary Refill: Capillary refill takes less than 2 seconds.     Comments: Abscess sites reviewed - resolving erythema. Dressings he would let us see are clean / dry.   Neurological:     Mental Status: He is alert and oriented to person, place, and time.     Lab Results Lab Results  Component Value Date   WBC 10.8 (H) 03/03/2023   HGB 9.6 (L) 03/03/2023   HCT 28.2 (L) 03/03/2023   MCV 88.1 03/03/2023   PLT 422 (H)  03/03/2023    Lab Results  Component Value Date   CREATININE 1.09 03/03/2023   BUN 26 (H) 03/03/2023   NA 132 (L) 03/03/2023   K 3.5 03/03/2023   CL 100 03/03/2023   CO2 24 03/03/2023  Lab Results  Component Value Date   ALT 12 02/25/2023   AST 13 (L) 02/25/2023   ALKPHOS 103 02/25/2023   BILITOT 0.4 02/25/2023     Microbiology: Recent Results (from the past 240 hour(s))  Resp panel by RT-PCR (RSV, Flu A&B, Covid) Anterior Nasal Swab     Status: None   Collection Time: 02/24/23  7:20 PM   Specimen: Anterior Nasal Swab  Result Value Ref Range Status   SARS Coronavirus 2 by RT PCR NEGATIVE NEGATIVE Final   Influenza A by PCR NEGATIVE NEGATIVE Final   Influenza B by PCR NEGATIVE NEGATIVE Final    Comment: (NOTE) The Xpert Xpress SARS-CoV-2/FLU/RSV plus assay is intended as an aid in the diagnosis of influenza from Nasopharyngeal swab specimens and should not be used as a sole basis for treatment. Nasal washings and aspirates are unacceptable for Xpert Xpress SARS-CoV-2/FLU/RSV testing.  Fact Sheet for Patients: BloggerCourse.com  Fact Sheet for Healthcare Providers: SeriousBroker.it  This test is not yet approved or cleared by the [place] FDA and has been authorized for detection and/or diagnosis of SARS-CoV-2 by FDA under an Emergency Use Authorization (EUA). This EUA will remain in effect (meaning this test can be used) for the duration of the COVID-19 declaration under Section 564(b)(1) of the Act, 21 U.S.C. section 360bbb-3(b)(1), unless the authorization is terminated or revoked.     Resp Syncytial Virus by PCR NEGATIVE NEGATIVE Final    Comment: (NOTE) Fact Sheet for Patients: BloggerCourse.com  Fact Sheet for Healthcare Providers: SeriousBroker.it  This test is not yet approved or cleared by the [place] FDA and has been authorized for detection  and/or diagnosis of SARS-CoV-2 by FDA under an Emergency Use Authorization (EUA). This EUA will remain in effect (meaning this test can be used) for the duration of the COVID-19 declaration under Section 564(b)(1) of the Act, 21 U.S.C. section 360bbb-3(b)(1), unless the authorization is terminated or revoked.  Performed at [facility], [address] [place], [place] [address]   Blood Culture (routine x 2)     Status: None   Collection Time: 02/24/23  7:32 PM   Specimen: BLOOD  Result Value Ref Range Status   Specimen Description BLOOD LEFT ANTECUBITAL  Final   Special Requests   Final    BOTTLES DRAWN AEROBIC AND ANAEROBIC Blood Culture adequate volume   Culture   Final    NO GROWTH 5 DAYS Performed at [facility], [address] [place], [place] [address]    Report Status 03/01/2023 FINAL  Final  Blood Culture (routine x 2)     Status: None   Collection Time: 02/24/23  7:46 PM   Specimen: BLOOD LEFT FOREARM  Result Value Ref Range Status   Specimen Description BLOOD LEFT FOREARM  Final   Special Requests   Final    BOTTLES DRAWN AEROBIC AND ANAEROBIC Blood Culture results may not be optimal due to an excessive volume of blood received in culture bottles   Culture   Final    NO GROWTH 5 DAYS Performed at [facility], [address], [place], [place] [address]    Report Status 03/01/2023 FINAL  Final  Aerobic/Anaerobic Culture w Gram Stain (surgical/deep wound)     Status: None (Preliminary result)   Collection Time: 02/27/23 11:23 AM   Specimen: Soft Tissue, Other  Result Value Ref Range Status   Specimen Description TISSUE  Final   Special Requests NONE  Final  Gram Stain   Final    NO WBC SEEN NO ORGANISMS SEEN Performed at [facility], [address], [place], [place] [address]    Culture   Final    RARE STAPHYLOCOCCUS AUREUS FEW STAPHYLOCOCCUS EPIDERMIDIS NO ANAEROBES ISOLATED; CULTURE IN PROGRESS FOR 5 DAYS    Report Status  PENDING  Incomplete   Organism ID, Bacteria STAPHYLOCOCCUS AUREUS  Final   Organism ID, Bacteria STAPHYLOCOCCUS EPIDERMIDIS  Final      Susceptibility   Staphylococcus aureus - MIC*    CIPROFLOXACIN >=8 RESISTANT Resistant     ERYTHROMYCIN >=8 RESISTANT Resistant     GENTAMICIN <=0.5 SENSITIVE Sensitive     OXACILLIN 0.5 SENSITIVE Sensitive     TETRACYCLINE <=1 SENSITIVE Sensitive     VANCOMYCIN 1 SENSITIVE Sensitive     TRIMETH/SULFA <=10 SENSITIVE Sensitive     CLINDAMYCIN >=8 RESISTANT Resistant     RIFAMPIN <=0.5 SENSITIVE Sensitive     Inducible Clindamycin NEGATIVE Sensitive     * RARE STAPHYLOCOCCUS AUREUS   Staphylococcus epidermidis - MIC*    CIPROFLOXACIN <=0.5 SENSITIVE Sensitive     ERYTHROMYCIN >=8 RESISTANT Resistant     GENTAMICIN <=0.5 SENSITIVE Sensitive     OXACILLIN >=4 RESISTANT Resistant     TETRACYCLINE 2 SENSITIVE Sensitive     VANCOMYCIN 1 SENSITIVE Sensitive     TRIMETH/SULFA >=320 RESISTANT Resistant     CLINDAMYCIN >=8 RESISTANT Resistant     RIFAMPIN <=0.5 SENSITIVE Sensitive     Inducible Clindamycin NEGATIVE Sensitive     * FEW STAPHYLOCOCCUS EPIDERMIDIS  Aerobic/Anaerobic Culture w Gram Stain (surgical/deep wound)     Status: None (Preliminary result)   Collection Time: 02/27/23 11:25 AM   Specimen: Soft Tissue, Other  Result Value Ref Range Status   Specimen Description TISSUE  Final   Special Requests NONE  Final   Gram Stain NO WBC SEEN NO ORGANISMS SEEN   Final   Culture   Final    NO GROWTH 4 DAYS NO ANAEROBES ISOLATED; CULTURE IN PROGRESS FOR 5 DAYS Performed at [facility], [address], [place], [place] [address]    Report Status PENDING  Incomplete     Rexene Alberts, MSN, NP-C [facility]  [email or web address] Pager: [phone] Office: [phone] [facility] Main Line: [phone] *Secure Chat Communication Welcome

## 2023-03-03 NOTE — Plan of Care (Signed)
  Problem: Education: Goal: Ability to describe self-care measures that may prevent or decrease complications (Diabetes Survival Skills Education) will improve Outcome: Progressing   Problem: Skin Integrity: Goal: Risk for impaired skin integrity will decrease Outcome: Progressing   

## 2023-03-03 NOTE — Plan of Care (Signed)
  Problem: Education: Goal: Ability to describe self-care measures that may prevent or decrease complications (Diabetes Survival Skills Education) will improve 03/03/2023 0157 by Charlynn Grimes, RN Outcome: Progressing 03/03/2023 0139 by Charlynn Grimes, RN Outcome: Progressing Goal: Individualized Educational Video(s) Outcome: Progressing   Problem: Skin Integrity: Goal: Risk for impaired skin integrity will decrease Outcome: Progressing   Problem: Education: Goal: Individualized Educational Video(s) Outcome: Progressing

## 2023-03-03 NOTE — Progress Notes (Signed)
Echocardiogram 2D Echocardiogram has been performed.  Ryan [name] 03/03/2023, 12:47 PM

## 2023-03-04 ENCOUNTER — Telehealth: Payer: Self-pay

## 2023-03-04 DIAGNOSIS — E1165 Type 2 diabetes mellitus with hyperglycemia: Secondary | ICD-10-CM | POA: Diagnosis not present

## 2023-03-04 LAB — BASIC METABOLIC PANEL
Anion gap: 9 (ref 5–15)
BUN: 29 mg/dL — ABNORMAL HIGH (ref 6–20)
CO2: 25 mmol/L (ref 22–32)
Calcium: 8.6 mg/dL — ABNORMAL LOW (ref 8.9–10.3)
Chloride: 99 mmol/L (ref 98–111)
Creatinine, Ser: 1.34 mg/dL — ABNORMAL HIGH (ref 0.61–1.24)
GFR, Estimated: >60 mL/min (ref >60)
Glucose, Bld: 266 mg/dL — ABNORMAL HIGH (ref 70–99)
Potassium: 4.2 mmol/L (ref 3.5–5.1)
Sodium: 133 mmol/L — ABNORMAL LOW (ref 135–145)

## 2023-03-04 LAB — CBC
HCT: 33.2 % — ABNORMAL LOW (ref 39.0–52.0)
Hemoglobin: 10.5 g/dL — ABNORMAL LOW (ref 13.0–17.0)
MCH: 28.8 pg (ref 26.0–34.0)
MCHC: 31.6 g/dL (ref 30.0–36.0)
MCV: 91 fL (ref 80.0–100.0)
Platelets: 508 10*3/uL — ABNORMAL HIGH (ref 150–400)
RBC: 3.65 MIL/uL — ABNORMAL LOW (ref 4.22–5.81)
RDW: 14 % (ref 11.5–15.5)
WBC: 11 10*3/uL — ABNORMAL HIGH (ref 4.0–10.5)
nRBC: 0 % (ref 0.0–0.2)

## 2023-03-04 LAB — AEROBIC/ANAEROBIC CULTURE W GRAM STAIN (SURGICAL/DEEP WOUND): Gram Stain: NONE SEEN

## 2023-03-04 MED ORDER — INSULIN ASPART 100 UNIT/ML IJ SOLN
12.0000 [IU] | Freq: Three times a day (TID) | INTRAMUSCULAR | Status: DC
  Administered 2023-03-04 – 2023-03-05 (×3): 12 [IU] via SUBCUTANEOUS

## 2023-03-04 NOTE — Inpatient Diabetes Management (Signed)
Inpatient Diabetes Program Recommendations  AACE/ADA: New Consensus Statement on Inpatient Glycemic Control (2015)  Target Ranges:  Prepandial:   less than 140 mg/dL      Peak postprandial:   less than 180 mg/dL (1-2 hours)      Critically ill patients:  140 - 180 mg/dL   Lab Results  Component Value Date   GLUCAP 368 (H) 03/03/2023   HGBA1C >15.5 (H) 02/25/2023    Review of Glycemic Control  CGM readings on 4/23 113/285/290/368  Diabetes history: DM2 Outpatient Diabetes medications: None? Current orders for Inpatient glycemic control:  Semglee 35 QHS Novolog 0-9 units TID and 0-5 units QHS Novolog 8 units TID  Inpatient Diabetes Program Recommendations:    Please increase meals coverage:  Novolog 12 units TID if he consumes at least 50%  Will continue to follow while inpatient.  Thank you, Dulce Sellar, MSN, CDCES Diabetes Coordinator Inpatient Diabetes Program [phone] (team pager from 8a-5p)

## 2023-03-04 NOTE — Progress Notes (Signed)
Physical Therapy Treatment Patient Details Name: Ryan [name] MRN: [id number] DOB: 03-Aug-1966 Today's Date: 03/04/2023   History of Present Illness Pt is 57 y.o.M admitted on 02/24/23 due to cellulitis, abscesses and multiple wounds. 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    PT Comments    Pt impulsive and ambulating around room with rollator, disconnecting wound vac without instruction, and perseverating on acquiring a different R residual limb guard. Acute PT spoke at length with pt and various Orthopedic MDs that patient wanted to reach out to find out what brace he had before. Initial PT eval note stated she acquired a medium ampushield from OR however pt has on a large and it is to big/long and doesn't fit him properly. Pt however doesn't want a smaller size in the ampushield he wants the brace that he received from [facility] when he had his initial R LE BKA. Spoke with Susy Frizzle and unfortunately is unable to come to our hospital as he isn't affiliated with [facility]. Attempted to contact Dr. Lajoyce Corners however haven't received a call back. Spoke with OR (received number from original PT on eval) and they do not have a medium ampushield at this time. Acute PT to cont to follow and follow up with optimal R residual limb guard as able.    Recommendations for follow up therapy are one component of a multi-disciplinary discharge planning process, led by the attending physician.  Recommendations may be updated based on patient status, additional functional criteria and insurance authorization.  Follow Up Recommendations       Assistance Recommended at Discharge Intermittent Supervision/Assistance  Patient can return home with the following A little help with walking and/or transfers;A little help with bathing/dressing/bathroom;Assistance with cooking/housework;Help  with stairs or ramp for entrance   Equipment Recommendations   (RW vs potential w/c pending progress)    Recommendations for Other Services       Precautions / Restrictions Precautions Precautions: Fall Restrictions Weight Bearing Restrictions: Yes RLE Weight Bearing: Non weight bearing     Mobility  Bed Mobility Overal bed mobility: Independent             General bed mobility comments: pt sitting on EOB upon PT arrival    Transfers Overall transfer level: Needs assistance Equipment used: Rollator (4 wheels) Transfers: Sit to/from Stand Sit to Stand: Min guard           General transfer comment: Min guard for safety; cues for hand placement. Pt flexed with posture    Ambulation/Gait Ambulation/Gait assistance: Min guard Gait Distance (Feet): 25 Feet Assistive device: Rollator (4 wheels) Gait Pattern/deviations: Step-to pattern Gait velocity: decreased     General Gait Details: Pt hopping on L LE using rollator (no RW in room , therapist offered to find one but pt declined); min guard safety and to stabilze Rollator; cues for safety; refused use of RW stating "I hate those things, this is way better"   Stairs             Wheelchair Mobility    Modified Rankin (Stroke Patients Only)       Balance Overall balance assessment: Needs assistance Sitting-balance support: No upper extremity supported, Feet supported Sitting balance-Leahy Scale: Good     Standing balance support: Bilateral upper extremity supported, During functional activity Standing balance-Leahy Scale: Poor Standing balance comment: Reliant on rollator  Cognition Arousal/Alertness: Awake/alert Behavior During Therapy: Impulsive Overall Cognitive Status: No family/caregiver present to determine baseline cognitive functioning                                 General Comments: suspect this is pt's baseline however pt with  decreased safety awareness and insight to deficits. Pt trying to smoke in bathroom. Pt also very perseverative on getting a different limb guard for R LE        Exercises      General Comments General comments (skin integrity, edema, etc.): VSS, wound vac intact      Pertinent Vitals/Pain Pain Assessment Pain Assessment: Faces Faces Pain Scale: Hurts a little bit Pain Location: RLE, back and neck Pain Descriptors / Indicators: Aching, Discomfort Pain Intervention(s): Monitored during session    Home Living                          Prior Function            PT Goals (current goals can now be found in the care plan section) Acute Rehab PT Goals Patient Stated Goal: return home PT Goal Formulation: With patient Time For Goal Achievement: 03/14/23 Potential to Achieve Goals: Fair Progress towards PT goals: Progressing toward goals    Frequency    Min 3X/week      PT Plan Current plan remains appropriate;Frequency needs to be updated    Co-evaluation              AM-PAC PT "6 Clicks" Mobility   Outcome Measure  Help needed turning from your back to your side while in a flat bed without using bedrails?: None Help needed moving from lying on your back to sitting on the side of a flat bed without using bedrails?: None Help needed moving to and from a bed to a chair (including a wheelchair)?: A Little Help needed standing up from a chair using your arms (e.g., wheelchair or bedside chair)?: A Little Help needed to walk in hospital room?: Total Help needed climbing 3-5 steps with a railing? : Total 6 Click Score: 16    End of Session Equipment Utilized During Treatment: Gait belt Activity Tolerance: Patient tolerated treatment well Patient left: in bed;with call bell/phone within reach (sitting on EOB) Nurse Communication: Mobility status PT Visit Diagnosis: Other abnormalities of gait and mobility (R26.89);Muscle weakness (generalized) (M62.81)      Time: 1610-9604 PT Time Calculation (min) (ACUTE ONLY): 24 min  Charges:  $Gait Training: 8-22 mins $Therapeutic Activity: 8-22 mins                     Lewis Shock, PT, DPT Acute Rehabilitation Services Secure chat preferred Office #: [phone]    Iona Hansen 03/04/2023, 2:18 PM

## 2023-03-04 NOTE — Progress Notes (Signed)
Patient ID: Ryan [name], male   DOB: 1966/10/23, 57 y.o.   MRN: [id number] Patient is seen in follow-up status post transtibial amputation revision on the right.  Patient states that his prosthetists recommends a different limb protector.  I discussed that he can follow-up with his prosthetists and obtain a new limb protector when he is out of the hospital.  Discussed that this is the only limb protector we have in the hospital.  Patient states that his physician at the wound center at [facility] long recommended against using [employer].  Will plan to discharge with a dry dressing at time of discharge and will have the wound VAC removed at time of discharge.  Discussed the importance of working on knee extension.

## 2023-03-04 NOTE — Progress Notes (Signed)
Patient found smoking cigarettes in his bathroom. Cigarettes confiscated. Patient educated on importance of smoking cessation while inside hospital building. Patient upset. Patient not wanting nicotine patch. Refused dressing changes.  Patient has stash of pepsi, coke, and other snacks in drawer. Patient noncompliant with diabetic diet. Patient educated on importance of compliance.

## 2023-03-04 NOTE — Progress Notes (Signed)
Dexcom shows BS to be 333

## 2023-03-04 NOTE — Progress Notes (Signed)
PROGRESS NOTE  Ryan [name]  DOB: January 30, 1966  PCP: Hortencia Conradi, NP WUJ:[id number]  DOA: 02/24/2023  LOS: 7 days  Hospital Day: 9  Brief narrative: Ryan [name] is a 57 y.o. male with PMH significant for uncontrolled DM2, right leg osteomyelitis s/p right BKA 2022, chronic pain syndrome on chronic opiates, chronic smoker 4/16, patient presented to the ED with complaint of multiple wounds on his right shoulder, right leg, left buttocks Patient seems to be noncompliant to follow ups and medications except for his pain medicines. Few weeks ago, patient noticed a boil on his right shoulder which got worse.  Few days ago, he noticed a similar boil on the right buttock.  He also noticed a sore spot on his right BKA stump that is not draining.  In the ED, afebrile, hemodynamically stable Initial labs with WBC count 11.9, hemoglobin 11.4, lactic acid 3, sodium 127, glucose elevated at 648 CRP 11.2, ESR elevated to 113 Urine ketones negative Right femur x-ray showed below-knee amputation.  No acute osseous lesions. Admitted to [facility] General surgery and orthopedics were consulted. 4/19, he had a combined procedure by draining which he underwent I&D and sharp debridement of right posterior shoulder and left buttock abscess followed by revision of right BKA. See below for details.  Subjective: Patient was seen and examined this morning. Sitting up at the edge of the bed.  Not in distress.  Pending SNF.  He is asking repeatedly to let him be out of the building to smoke.  I told him the hospital policy would not allow it.  Assessment and plan: Left buttock abscess Right upper back/shoulder wound IND of both wounds by general surgery on 4/19 Wet-to-dry dressing change per general surgery. OR cultures growing MRSA. Currently on IV Rocephin and IV vancomycin.  ID consult obtained.  Recommended doxycycline twice daily for 2 weeks. Per general surgery, wound dressing twice a day to  continue.  Outpatient follow-up in 3 weeks  Right BKA stump cellulitis/drainage 4/19, revision of right BKA by Dr. Lajoyce Corners.  Tissue culture and bone culture pending.  Wound VAC in place.  Patient may be discharged with a Prevena plus portable wound VAC pump at discharge. Recommended against this or kneeling scooter until the skin is healed.   Type 2 diabetes mellitus uncontrolled with hyperglycemia A1c uncontrolled, more than 15 Not taking any medicines at home Currently on Lantus to 35 units, scheduled Premeal aspart 18 units 3 times daily along with sliding scale insulin. Continue CBG monitoring Patient does not seem to be following any dietary restriction. Recent Labs  Lab 02/28/23 2018 03/01/23 0816 03/01/23 2135 03/02/23 0812 03/03/23 2201  GLUCAP 338* 178* 343* 157* 368*    Chronic pain syndrome  Chronic opiate use  sees pain clinic in [place], [place]. PTA on Neurontin and oxycodone 15 mg 7 times a day Continue same. Also on as needed IV Dilaudid for severe pain.  Tobacco use disorder Continues to smoke 1 pack a day.  Wants to smoke while in the hospital.  Not happy when I did not let him go out for smoking.  Nicotine patch offered.  Left lower extremity swelling Patient with pretty significant left lower extremity edema. US duplex scan negative for DVT. Echocardiogram with normal LVEF, RV function and size.  4/23, started on Lasix 40 mg IV twice daily. Creatinine slightly up today.  Hold Lasix. Recent Labs    02/24/23 1735 02/25/23 0429 03/02/23 0331 03/03/23 0532 03/04/23 0417  BUN 17 13  23* 26* 29*  CREATININE 1.13 0.87 1.06 1.09 1.34*       Mobility: Was using prosthesis on the right amputation stump.  PT eval pending.  Goals of care   Code Status: Full Code     DVT prophylaxis:  SCD's Start: 02/27/23 1342 heparin injection 5,000 Units Start: 02/25/23 0100 SCDs Start: 02/25/23 0045   Antimicrobials: IV Rocephin, IV vancomycin Fluid: Encourage oral  hydration.  Not on IV fluid Consultants: General surgery, orthopedics Family Communication: None at bedside  Status: Inpatient Level of care:  Med-Surg   Needs to continue in-hospital care:  MRSA growing in OR culture.  Patient from: Home Anticipated d/c to: Likely SNF   Diet:  Diet Order             Diet Carb Modified Fluid consistency: Thin; Room service appropriate? Yes  Diet effective now                   Scheduled Meds:  vitamin C  1,000 mg Oral Daily   docusate sodium  100 mg Oral Daily   doxycycline  100 mg Oral Q12H   gabapentin  800 mg Oral TID   heparin  5,000 Units Subcutaneous Q8H   insulin aspart  0-15 Units Subcutaneous TID WC   insulin aspart  0-5 Units Subcutaneous QHS   insulin aspart  12 Units Subcutaneous TID WC   insulin glargine-yfgn  10 Units Subcutaneous Once   insulin glargine-yfgn  35 Units Subcutaneous QHS   leptospermum manuka honey  1 Application Topical Daily   leptospermum manuka honey  1 Application Topical Daily   nutrition supplement (JUVEN)  1 packet Oral BID BM   pantoprazole  40 mg Oral Daily   Zinc Oxide   Topical BID   zinc sulfate  220 mg Oral Daily    PRN meds: acetaminophen, alum & mag hydroxide-simeth, bisacodyl, guaiFENesin-dextromethorphan, hydrALAZINE, HYDROmorphone (DILAUDID) injection, labetalol, magnesium citrate, magnesium sulfate bolus IVPB, metoprolol tartrate, ondansetron, ondansetron **OR** [DISCONTINUED] ondansetron (ZOFRAN) IV, oxyCODONE, phenol, polyethylene glycol, potassium chloride   Infusions:   magnesium sulfate bolus IVPB      Antimicrobials: Anti-infectives (From admission, onward)    Start     Dose/Rate Route Frequency Ordered Stop   03/03/23 2200  doxycycline (VIBRA-TABS) tablet 100 mg        100 mg Oral Every 12 hours 03/03/23 1533 03/14/23 0959   03/03/23 1800  vancomycin (VANCOREADY) IVPB 750 mg/150 mL  Status:  Discontinued        750 mg 150 mL/hr over 60 Minutes Intravenous Every 12  hours 03/03/23 0824 03/03/23 1533   02/27/23 1430  ceFAZolin (ANCEF) IVPB 2g/100 mL premix  Status:  Discontinued        2 g 200 mL/hr over 30 Minutes Intravenous Every 8 hours 02/27/23 1341 02/27/23 1416   02/27/23 1130  ceFAZolin (ANCEF) IVPB 2g/100 mL premix        2 g 200 mL/hr over 30 Minutes Intravenous On call to O.R. 02/26/23 1840 02/27/23 1049   02/27/23 1128  vancomycin (VANCOCIN) powder  Status:  Discontinued          As needed 02/27/23 1148 02/27/23 1155   02/25/23 1530  vancomycin (VANCOREADY) IVPB 1250 mg/250 mL  Status:  Discontinued        1,250 mg 166.7 mL/hr over 90 Minutes Intravenous Every 12 hours 02/25/23 1430 03/03/23 0824   02/25/23 1430  cefTRIAXone (ROCEPHIN) 2 g in sodium chloride 0.9 % 100 mL IVPB  Status:  Discontinued        2 g 200 mL/hr over 30 Minutes Intravenous Every 24 hours 02/25/23 1339 03/03/23 1533   02/25/23 0900  vancomycin (VANCOCIN) IVPB 1000 mg/200 mL premix  Status:  Discontinued        1,000 mg 200 mL/hr over 60 Minutes Intravenous Every 12 hours 02/24/23 2323 02/25/23 0010   02/25/23 0600  cephALEXin (KEFLEX) capsule 500 mg  Status:  Discontinued        500 mg Oral Every 8 hours 02/25/23 0010 02/25/23 1339   02/24/23 1930  vancomycin (VANCOCIN) IVPB 1000 mg/200 mL premix  Status:  Discontinued        1,000 mg 200 mL/hr over 60 Minutes Intravenous  Once 02/24/23 1921 02/24/23 1926   02/24/23 1930  cefTRIAXone (ROCEPHIN) 2 g in sodium chloride 0.9 % 100 mL IVPB        2 g 200 mL/hr over 30 Minutes Intravenous  Once 02/24/23 1921 02/24/23 2058   02/24/23 1930  vancomycin (VANCOREADY) IVPB 2000 mg/400 mL        2,000 mg 200 mL/hr over 120 Minutes Intravenous  Once 02/24/23 1926 02/24/23 2311       Nutritional status:  Body mass index is 28.33 kg/m.          Objective: Vitals:   03/03/23 2201 03/04/23 0926  BP: 126/67 126/70  Pulse: (!) 106 97  Resp: 20 17  Temp: 98.5 F (36.9 C) 98.4 F (36.9 C)  SpO2: 98% 97%     Intake/Output Summary (Last 24 hours) at 03/04/2023 1410 Last data filed at 03/03/2023 1900 Gross per 24 hour  Intake --  Output 600 ml  Net -600 ml    Filed Weights   03/01/23 0500 03/02/23 0608 03/03/23 0445  Weight: 105.6 kg 112.9 kg 97.4 kg   Weight change:  Body mass index is 28.33 kg/m.   Physical Exam: General exam: Middle-aged Caucasian male. Pain partially controlled.  Not in distress Skin: No rashes, lesions or ulcers. HEENT: Atraumatic, normocephalic, no obvious bleeding Lungs: Clear to auscultate bilaterally CVS: Regular rate and rhythm, no murmur GI/Abd soft, nontender, nondistended, bowel sound present CNS: Alert, awake, slow to respond, oriented x 3 Psychiatry: Mood appropriate.  Not so conversational. Extremities: Right BKA revision with bandage to stump. Right shoulder area with postsurgical status.  Left lower extremity 1-2+ pedal edema present  Data Review: I have personally reviewed the laboratory data and studies available.  F/u labs ordered Unresulted Labs (From admission, onward)     Start     Ordered   03/02/23 0500  CBC  Daily,   R     Question:  Specimen collection method  Answer:  Lab=Lab collect   03/01/23 1257   03/02/23 0500  Basic metabolic panel  Daily,   R     Question:  Specimen collection method  Answer:  Lab=Lab collect   03/01/23 1257            Total time spent in review of labs and imaging, patient evaluation, formulation of plan, documentation and communication with family: 45 minutes  Signed, Lorin Glass, MD [facility] 03/04/2023

## 2023-03-04 NOTE — Telephone Encounter (Signed)
Please see message below. The pt is wearing a limb protector for recent BKA revision. PT requesting something different please see message below.

## 2023-03-04 NOTE — Telephone Encounter (Signed)
Morrie Sheldon from PT at the hospital called. Patient is wanting a different lower extremity guard for his amputation. She said that it sounds like patient is describing a knee immobilizer, but therapist would need orders before she could do anything different. [phone].Marland KitchenMarland KitchenMorrie Sheldon is only there until about 2:15pm today. I explained that Dr.Duda is out of the office this afternoon, but I would relay this to his assistant.

## 2023-03-04 NOTE — TOC Progression Note (Signed)
Transition of Care [facility]) - Progression Note    Patient Details  Name: Ryan [name] MRN: [id number] Date of Birth: [birth year]  Transition of Care [facility]) CM/SW Contact  Mikaia Janvier A [place], [place] Phone Number: 03/04/2023, 4:32 PM  Clinical Narrative:    CSW reached out to [employer] at [facility].  Pt currently under review. Discharge pending bed offers.   TOC will continue to follow.    Expected Discharge Plan: Skilled Nursing Facility Barriers to Discharge: Continued Medical Work up  Expected Discharge Plan and Services In-house Referral: Clinical Social Work Discharge Planning Services: Other - See comment (TOC for coordination of care needs.) Post Acute Care Choice: Skilled Nursing Facility Living arrangements for the past 2 months: Single Family Home                                       Social Determinants of Health (SDOH) Interventions SDOH Screenings   Food Insecurity: Food Insecurity Present (02/24/2023)  Housing: Low Risk  (02/24/2023)  Transportation Needs: No Transportation Needs (02/24/2023)  Utilities: Not At Risk (02/24/2023)  Tobacco Use: High Risk (03/02/2023)    Readmission Risk Interventions     No data to display

## 2023-03-05 DIAGNOSIS — E1165 Type 2 diabetes mellitus with hyperglycemia: Secondary | ICD-10-CM | POA: Diagnosis not present

## 2023-03-05 LAB — BASIC METABOLIC PANEL
Anion gap: 7 (ref 5–15)
BUN: 27 mg/dL — ABNORMAL HIGH (ref 6–20)
CO2: 29 mmol/L (ref 22–32)
Calcium: 9 mg/dL (ref 8.9–10.3)
Chloride: 98 mmol/L (ref 98–111)
Creatinine, Ser: 1.07 mg/dL (ref 0.61–1.24)
GFR, Estimated: >60 mL/min (ref >60)
Glucose, Bld: 249 mg/dL — ABNORMAL HIGH (ref 70–99)
Potassium: 4.6 mmol/L (ref 3.5–5.1)
Sodium: 134 mmol/L — ABNORMAL LOW (ref 135–145)

## 2023-03-05 LAB — CBC
HCT: 30.5 % — ABNORMAL LOW (ref 39.0–52.0)
Hemoglobin: 9.7 g/dL — ABNORMAL LOW (ref 13.0–17.0)
MCH: 29 pg (ref 26.0–34.0)
MCHC: 31.8 g/dL (ref 30.0–36.0)
MCV: 91.3 fL (ref 80.0–100.0)
Platelets: 473 10*3/uL — ABNORMAL HIGH (ref 150–400)
RBC: 3.34 MIL/uL — ABNORMAL LOW (ref 4.22–5.81)
RDW: 14.1 % (ref 11.5–15.5)
WBC: 8.8 10*3/uL (ref 4.0–10.5)
nRBC: 0 % (ref 0.0–0.2)

## 2023-03-05 MED ORDER — INSULIN ASPART 100 UNIT/ML IJ SOLN
15.0000 [IU] | Freq: Three times a day (TID) | INTRAMUSCULAR | Status: DC
  Administered 2023-03-05 – 2023-03-07 (×5): 15 [IU] via SUBCUTANEOUS

## 2023-03-05 NOTE — Progress Notes (Signed)
PROGRESS NOTE  Ryan [name]  DOB: 11-Jun-1966  PCP: Hortencia Conradi, NP ZOX:[id number]  DOA: 02/24/2023  LOS: 8 days  Hospital Day: 10  Brief narrative: Ryan [name] is a 57 y.o. male with PMH significant for uncontrolled DM2, right leg osteomyelitis s/p right BKA 2022, chronic pain syndrome on chronic opiates, chronic smoker 4/16, patient presented to the ED with complaint of multiple wounds on his right shoulder, right leg, left buttocks Patient seems to be noncompliant to follow ups and medications except for his pain medicines. Few weeks ago, patient noticed a boil on his right shoulder which got worse.  Few days ago, he noticed a similar boil on the right buttock.  He also noticed a sore spot on his right BKA stump that is not draining.  In the ED, afebrile, hemodynamically stable Initial labs with WBC count 11.9, hemoglobin 11.4, lactic acid 3, sodium 127, glucose elevated at 648 CRP 11.2, ESR elevated to 113 Urine ketones negative Right femur x-ray showed below-knee amputation.  No acute osseous lesions. Admitted to [facility] General surgery and orthopedics were consulted. 4/19, he had a combined procedure by draining which he underwent I&D and sharp debridement of right posterior shoulder and left buttock abscess followed by revision of right BKA. See below for details.  Subjective: Patient was seen and examined this morning. Sitting up at the edge of the bed.  Not in distress.  He is concerned about ill fitting limb protector  Assessment and plan: Left buttock abscess Right upper back/shoulder wound IND of both wounds by general surgery on 4/19 Wet-to-dry dressing change per general surgery. OR cultures growing MRSA. Currently on IV Rocephin and IV vancomycin.  ID consult obtained.  Recommended doxycycline twice daily for 2 weeks. Per general surgery, wound dressing twice a day to continue.  Outpatient follow-up in 3 weeks  Right BKA stump cellulitis/drainage 4/19,  revision of right BKA by Dr. Lajoyce Corners.  Tissue culture and bone culture pending.  Wound VAC in place.  Patient may be discharged with a Prevena plus portable wound VAC pump at discharge. Recommended against this or kneeling scooter until the skin is healed.  He complains about limb protector not fitting properly.  He discussed with his prosthetist at [facility] for a new ambushield.  He needs to follow-up as an outpatient for that.  Type 2 diabetes mellitus uncontrolled with hyperglycemia A1c uncontrolled, more than 15 Not taking any medicines at home Currently on Lantus to 35 units, scheduled Premeal aspart 12 units 3 times daily along with sliding scale insulin.  Patient is not following dietary restriction.  Sugar level running elevated over 200 consistently.  Diabetes care coordinator consult appreciated Premeal aspart increased to 15 units 3 times daily. Recent Labs  Lab 02/28/23 2018 03/01/23 0816 03/01/23 2135 03/02/23 0812 03/03/23 2201  GLUCAP 338* 178* 343* 157* 368*    Chronic pain syndrome  Chronic opiate use  sees pain clinic in [place], [place]. PTA on Neurontin and oxycodone 15 mg 7 times a day Continue same. Also on as needed IV Dilaudid for severe pain.  Tobacco use disorder Continues to smoke 1 pack a day.  Wants to smoke while in the hospital.  Not happy when I did not let him go out for smoking.  Nicotine patch offered.  Left lower extremity swelling Patient with pretty significant left lower extremity edema. US duplex scan negative for DVT. Echocardiogram with normal LVEF, RV function and size.  Gently diuresed with IV Lasix.  Held after  creatinine climbed up.  Encouraged to keep legs elevated. Recent Labs    02/24/23 1735 02/25/23 0429 03/02/23 0331 03/03/23 0532 03/04/23 0417 03/05/23 0611  BUN 17 13 23* 26* 29* 27*  CREATININE 1.13 0.87 1.06 1.09 1.34* 1.07    Impaired mobility PT eval completion of recommended   Goals of care   Code Status: Full Code      DVT prophylaxis:  SCD's Start: 02/27/23 1342 heparin injection 5,000 Units Start: 02/25/23 0100 SCDs Start: 02/25/23 0045   Antimicrobials: Oral doxycycline Fluid: Encourage oral hydration.  Not on IV fluid Consultants: General surgery, orthopedics Family Communication: None at bedside  Status: Inpatient Level of care:  Med-Surg   Needs to continue in-hospital care:  MRSA growing in OR culture.  Patient from: Home Anticipated d/c to: Likely SNF   Diet:  Diet Order             Diet Carb Modified Fluid consistency: Thin; Room service appropriate? Yes  Diet effective now                   Scheduled Meds:  vitamin C  1,000 mg Oral Daily   docusate sodium  100 mg Oral Daily   doxycycline  100 mg Oral Q12H   gabapentin  800 mg Oral TID   heparin  5,000 Units Subcutaneous Q8H   insulin aspart  0-15 Units Subcutaneous TID WC   insulin aspart  0-5 Units Subcutaneous QHS   insulin aspart  15 Units Subcutaneous TID WC   insulin glargine-yfgn  10 Units Subcutaneous Once   insulin glargine-yfgn  35 Units Subcutaneous QHS   leptospermum manuka honey  1 Application Topical Daily   leptospermum manuka honey  1 Application Topical Daily   nutrition supplement (JUVEN)  1 packet Oral BID BM   pantoprazole  40 mg Oral Daily   Zinc Oxide   Topical BID   zinc sulfate  220 mg Oral Daily    PRN meds: acetaminophen, alum & mag hydroxide-simeth, bisacodyl, guaiFENesin-dextromethorphan, hydrALAZINE, HYDROmorphone (DILAUDID) injection, labetalol, magnesium citrate, magnesium sulfate bolus IVPB, metoprolol tartrate, ondansetron, ondansetron **OR** [DISCONTINUED] ondansetron (ZOFRAN) IV, oxyCODONE, phenol, polyethylene glycol, potassium chloride   Infusions:   magnesium sulfate bolus IVPB      Antimicrobials: Anti-infectives (From admission, onward)    Start     Dose/Rate Route Frequency Ordered Stop   03/03/23 2200  doxycycline (VIBRA-TABS) tablet 100 mg        100 mg Oral  Every 12 hours 03/03/23 1533 03/14/23 0959   03/03/23 1800  vancomycin (VANCOREADY) IVPB 750 mg/150 mL  Status:  Discontinued        750 mg 150 mL/hr over 60 Minutes Intravenous Every 12 hours 03/03/23 0824 03/03/23 1533   02/27/23 1430  ceFAZolin (ANCEF) IVPB 2g/100 mL premix  Status:  Discontinued        2 g 200 mL/hr over 30 Minutes Intravenous Every 8 hours 02/27/23 1341 02/27/23 1416   02/27/23 1130  ceFAZolin (ANCEF) IVPB 2g/100 mL premix        2 g 200 mL/hr over 30 Minutes Intravenous On call to O.R. 02/26/23 1840 02/27/23 1049   02/27/23 1128  vancomycin (VANCOCIN) powder  Status:  Discontinued          As needed 02/27/23 1148 02/27/23 1155   02/25/23 1530  vancomycin (VANCOREADY) IVPB 1250 mg/250 mL  Status:  Discontinued        1,250 mg 166.7 mL/hr over 90 Minutes Intravenous Every 12  hours 02/25/23 1430 03/03/23 0824   02/25/23 1430  cefTRIAXone (ROCEPHIN) 2 g in sodium chloride 0.9 % 100 mL IVPB  Status:  Discontinued        2 g 200 mL/hr over 30 Minutes Intravenous Every 24 hours 02/25/23 1339 03/03/23 1533   02/25/23 0900  vancomycin (VANCOCIN) IVPB 1000 mg/200 mL premix  Status:  Discontinued        1,000 mg 200 mL/hr over 60 Minutes Intravenous Every 12 hours 02/24/23 2323 02/25/23 0010   02/25/23 0600  cephALEXin (KEFLEX) capsule 500 mg  Status:  Discontinued        500 mg Oral Every 8 hours 02/25/23 0010 02/25/23 1339   02/24/23 1930  vancomycin (VANCOCIN) IVPB 1000 mg/200 mL premix  Status:  Discontinued        1,000 mg 200 mL/hr over 60 Minutes Intravenous  Once 02/24/23 1921 02/24/23 1926   02/24/23 1930  cefTRIAXone (ROCEPHIN) 2 g in sodium chloride 0.9 % 100 mL IVPB        2 g 200 mL/hr over 30 Minutes Intravenous  Once 02/24/23 1921 02/24/23 2058   02/24/23 1930  vancomycin (VANCOREADY) IVPB 2000 mg/400 mL        2,000 mg 200 mL/hr over 120 Minutes Intravenous  Once 02/24/23 1926 02/24/23 2311       Nutritional status:  Body mass index is 28.53 kg/m.           Objective: Vitals:   03/05/23 0518 03/05/23 0851  BP: 131/83 125/78  Pulse: 91 (!) 104  Resp: 17   Temp: 97.9 F (36.6 C) 98 F (36.7 C)  SpO2: 100% 100%    Intake/Output Summary (Last 24 hours) at 03/05/2023 1257 Last data filed at 03/05/2023 0500 Gross per 24 hour  Intake 236 ml  Output 1525 ml  Net -1289 ml    Filed Weights   03/02/23 0608 03/03/23 0445 03/05/23 0500  Weight: 112.9 kg 97.4 kg 98.1 kg   Weight change:  Body mass index is 28.53 kg/m.   Physical Exam: General exam: Middle-aged Caucasian male. Pain partially controlled.  Not in distress. Skin: No rashes, lesions or ulcers. HEENT: Atraumatic, normocephalic, no obvious bleeding Lungs: Clear to auscultate bilaterally CVS: Regular rate and rhythm, no murmur GI/Abd soft, nontender, nondistended, bowel sound present CNS: Alert, awake, slow to respond, oriented x 3 Psychiatry: Mood appropriate.  Not so conversational. Extremities: Right BKA revision with bandage to stump. Right shoulder area with postsurgical status. Left lower extremity 1-2+ pedal edema present  Data Review: I have personally reviewed the laboratory data and studies available.  F/u labs ordered Unresulted Labs (From admission, onward)     Start     Ordered   03/02/23 0500  CBC  Daily,   R     Question:  Specimen collection method  Answer:  Lab=Lab collect   03/01/23 1257   03/02/23 0500  Basic metabolic panel  Daily,   R     Question:  Specimen collection method  Answer:  Lab=Lab collect   03/01/23 1257            Total time spent in review of labs and imaging, patient evaluation, formulation of plan, documentation and communication with family: 45 minutes  Signed, Lorin Glass, MD [facility] 03/05/2023

## 2023-03-05 NOTE — Progress Notes (Signed)
Physical Therapy Treatment Patient Details Name: Ryan [name] MRN: [id number] DOB: 06-Mar-1966 Today's Date: 03/05/2023   History of Present Illness Pt is 57 y.o.M admitted on 02/24/23 due to cellulitis, abscesses and multiple wounds. 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    PT Comments    Pt reporting R knee pain and frustration over not having his limb guard of choice. PT explained that pt will have to follow up OP for a different limb guard, pt expresses frustration but acknowledges. Pt with deficits in safety awareness throughout session requiring safety cues throughout. Pt mobilizing at close guard level in the hallway, fatigues quickly. PT to continue to follow.     Recommendations for follow up therapy are one component of a multi-disciplinary discharge planning process, led by the attending physician.  Recommendations may be updated based on patient status, additional functional criteria and insurance authorization.  Follow Up Recommendations       Assistance Recommended at Discharge Intermittent Supervision/Assistance  Patient can return home with the following A little help with walking and/or transfers;A little help with bathing/dressing/bathroom;Assistance with cooking/housework;Help with stairs or ramp for entrance   Equipment Recommendations   (RW vs potential w/c pending progress)    Recommendations for Other Services       Precautions / Restrictions Precautions Precautions: Fall Precaution Comments: wound vac (pt disconnected his wound vac himself prior to PT arrival and declines plugging it back in until the end of the session), limb protector (pt declines to wear it) Restrictions Weight Bearing Restrictions: Yes RLE Weight Bearing: Non weight bearing     Mobility  Bed Mobility Overal bed mobility: Needs Assistance              General bed mobility comments: pt sitting on EOB upon PT arrival    Transfers Overall transfer level: Needs assistance Equipment used: Rollator (4 wheels) Transfers: Sit to/from Stand Sit to Stand: Min guard           General transfer comment: close guard for safety, poor safety awareness and inappropriately pulling up on rollator when moving sit>stand    Ambulation/Gait Ambulation/Gait assistance: Min guard Gait Distance (Feet): 60 Feet Assistive device: Rollator (4 wheels) Gait Pattern/deviations: Step-to pattern Gait velocity: decreased     General Gait Details: hop to pattern, pt requesting to use rollator vs RW. Cues for sequencing and safety, as well as energy conservation   Stairs             Wheelchair Mobility    Modified Rankin (Stroke Patients Only)       Balance Overall balance assessment: Needs assistance Sitting-balance support: No upper extremity supported, Feet supported Sitting balance-Leahy Scale: Good     Standing balance support: Bilateral upper extremity supported, During functional activity Standing balance-Leahy Scale: Poor Standing balance comment: Reliant on rollator                            Cognition Arousal/Alertness: Awake/alert Behavior During Therapy: Impulsive Overall Cognitive Status: No family/caregiver present to determine baseline cognitive functioning                                 General Comments: poor safety awareness and insight into deficits, pt refuses limb protector and unplugs  wound vac        Exercises Amputee Exercises Knee Extension: AROM, Right, Seated (x2 - seated EOB, pt declines doing any more stating his leg hurts. PT encouraged pt to perform more later in sitting and supine to encourage full knee extension)    General Comments        Pertinent Vitals/Pain Pain Assessment Pain Assessment: Faces Faces Pain Scale: Hurts even more Pain Location: R  residual limb Pain Descriptors / Indicators: Aching, Discomfort, Sore Pain Intervention(s): Limited activity within patient's tolerance, Monitored during session, Repositioned    Home Living                          Prior Function            PT Goals (current goals can now be found in the care plan section) Acute Rehab PT Goals Patient Stated Goal: return home PT Goal Formulation: With patient Time For Goal Achievement: 03/14/23 Potential to Achieve Goals: Fair Progress towards PT goals: Progressing toward goals    Frequency    Min 3X/week      PT Plan Current plan remains appropriate    Co-evaluation              AM-PAC PT "6 Clicks" Mobility   Outcome Measure  Help needed turning from your back to your side while in a flat bed without using bedrails?: A Little Help needed moving from lying on your back to sitting on the side of a flat bed without using bedrails?: A Little Help needed moving to and from a bed to a chair (including a wheelchair)?: A Little Help needed standing up from a chair using your arms (e.g., wheelchair or bedside chair)?: A Little Help needed to walk in hospital room?: A Lot Help needed climbing 3-5 steps with a railing? : Total 6 Click Score: 15    End of Session   Activity Tolerance: Patient tolerated treatment well Patient left: in bed;with call bell/phone within reach;with family/visitor present (sitting EOB) Nurse Communication: Mobility status PT Visit Diagnosis: Other abnormalities of gait and mobility (R26.89);Muscle weakness (generalized) (M62.81)     Time: 1610-9604 PT Time Calculation (min) (ACUTE ONLY): 17 min  Charges:  $Gait Training: 8-22 mins                     Marye Round, PT DPT Acute Rehabilitation Services Secure Chat Preferred  Office [phone]    Terez Montee Sheliah Plane 03/05/2023, 3:54 PM

## 2023-03-05 NOTE — TOC Progression Note (Signed)
Transition of Care [facility]) - Progression Note    Patient Details  Name: Ryan [name] MRN: [id number] Date of Birth: Nov 18, 1965  [facility] CM/SW Contact  Kelis Plasse A [place], [place] Phone Number: 03/05/2023, 10:43 AM  Clinical Narrative:    CSW met with pt at bedside to discuss bed offers at [facility]. He stated he would be ok with going there but wanted to hear back from [facility] first before making a decision.   He also said that he needed an equipment prosthetic change and was working with his provider to potentially get the correct fitting and assist him with getting the correct equipment.   TOC will continue to follow.    Expected Discharge Plan: Skilled Nursing Facility Barriers to Discharge: Continued Medical Work up  Expected Discharge Plan and Services In-house Referral: Clinical Social Work Discharge Planning Services: Other - See comment (TOC for coordination of care needs.) Post Acute Care Choice: Skilled Nursing Facility Living arrangements for the past 2 months: Single Family Home                                       Social Determinants of Health (SDOH) Interventions SDOH Screenings   Food Insecurity: Food Insecurity Present (02/24/2023)  Housing: Low Risk  (02/24/2023)  Transportation Needs: No Transportation Needs (02/24/2023)  Utilities: Not At Risk (02/24/2023)  Tobacco Use: High Risk (03/02/2023)    Readmission Risk Interventions     No data to display

## 2023-03-05 NOTE — Inpatient Diabetes Management (Signed)
Inpatient Diabetes Program Recommendations  AACE/ADA: New Consensus Statement on Inpatient Glycemic Control (2015)  Target Ranges:  Prepandial:   less than 140 mg/dL      Peak postprandial:   less than 180 mg/dL (1-2 hours)      Critically ill patients:  140 - 180 mg/dL       Dexcom CGM readings this AM:  307 (Taken after Breakfast)             279 (Prior to Lunch)     Home DM Meds: None listed; had been on Trulicity, Tresiba, and Fiasp insulin in the past    Current Orders: Semglee 35 units QHS      Novolog Moderate Correction Scale/ SSI (0-15 units) TID AC + HS         Novolog 12 units TID with meals     MD- Note Dexcom Scan was elevated this AM, however, RN told me scan was taken after pt ate Breakfast  Dexcom Scan was 279 prior to Lunch  May consider increasing the Novolog Meal Coverage to 15 units TID with meals    --Will follow patient during hospitalization--  Ambrose Finland RN, MSN, CDCES Diabetes Coordinator Inpatient Glycemic Control Team Team Pager: [phone] (8a-5p)

## 2023-03-06 DIAGNOSIS — E1165 Type 2 diabetes mellitus with hyperglycemia: Secondary | ICD-10-CM | POA: Diagnosis not present

## 2023-03-06 LAB — BASIC METABOLIC PANEL
Anion gap: 9 (ref 5–15)
BUN: 26 mg/dL — ABNORMAL HIGH (ref 6–20)
CO2: 26 mmol/L (ref 22–32)
Calcium: 8.9 mg/dL (ref 8.9–10.3)
Chloride: 100 mmol/L (ref 98–111)
Creatinine, Ser: 1.12 mg/dL (ref 0.61–1.24)
GFR, Estimated: >60 mL/min (ref >60)
Glucose, Bld: 135 mg/dL — ABNORMAL HIGH (ref 70–99)
Potassium: 4.3 mmol/L (ref 3.5–5.1)
Sodium: 135 mmol/L (ref 135–145)

## 2023-03-06 LAB — CBC
HCT: 29.6 % — ABNORMAL LOW (ref 39.0–52.0)
Hemoglobin: 9.8 g/dL — ABNORMAL LOW (ref 13.0–17.0)
MCH: 29.5 pg (ref 26.0–34.0)
MCHC: 33.1 g/dL (ref 30.0–36.0)
MCV: 89.2 fL (ref 80.0–100.0)
Platelets: 468 10*3/uL — ABNORMAL HIGH (ref 150–400)
RBC: 3.32 MIL/uL — ABNORMAL LOW (ref 4.22–5.81)
RDW: 14.3 % (ref 11.5–15.5)
WBC: 8.7 10*3/uL (ref 4.0–10.5)
nRBC: 0 % (ref 0.0–0.2)

## 2023-03-06 MED ORDER — MEDIHONEY WOUND/BURN DRESSING EX PSTE: 1.0000 | PASTE | Freq: Every day | CUTANEOUS | Status: DC

## 2023-03-06 MED ORDER — ACETAMINOPHEN 325 MG PO TABS: 325.0000 mg | ORAL_TABLET | Freq: Four times a day (QID) | ORAL | Status: DC | PRN

## 2023-03-06 MED ORDER — INSULIN ASPART 100 UNIT/ML IJ SOLN: 0.0000 [IU] | Freq: Three times a day (TID) | INTRAMUSCULAR | 11 refills | Status: DC

## 2023-03-06 MED ORDER — DOXYCYCLINE HYCLATE 100 MG PO TABS: 100.0000 mg | ORAL_TABLET | Freq: Two times a day (BID) | ORAL | Status: AC

## 2023-03-06 MED ORDER — ZINC OXIDE 12.8 % EX OINT: TOPICAL_OINTMENT | Freq: Two times a day (BID) | CUTANEOUS | 0 refills | Status: DC

## 2023-03-06 MED ORDER — BISACODYL 5 MG PO TBEC: 5.0000 mg | DELAYED_RELEASE_TABLET | Freq: Every day | ORAL | 0 refills | Status: DC | PRN

## 2023-03-06 MED ORDER — DOCUSATE SODIUM 100 MG PO CAPS: 100.0000 mg | ORAL_CAPSULE | Freq: Every day | ORAL | 0 refills | Status: DC

## 2023-03-06 MED ORDER — INSULIN ASPART 100 UNIT/ML IJ SOLN: 0.0000 [IU] | Freq: Every day | INTRAMUSCULAR | 11 refills | Status: DC

## 2023-03-06 MED ORDER — POLYETHYLENE GLYCOL 3350 17 G PO PACK: 17.0000 g | PACK | Freq: Every day | ORAL | 0 refills | Status: DC | PRN

## 2023-03-06 MED ORDER — PANTOPRAZOLE SODIUM 40 MG PO TBEC: 40.0000 mg | DELAYED_RELEASE_TABLET | Freq: Every day | ORAL | Status: DC

## 2023-03-06 MED ORDER — OXYCODONE HCL 15 MG PO TABS: 15.0000 mg | ORAL_TABLET | ORAL | 0 refills | Status: AC | PRN

## 2023-03-06 MED ORDER — INSULIN ASPART 100 UNIT/ML IJ SOLN: 15.0000 [IU] | Freq: Three times a day (TID) | INTRAMUSCULAR | 11 refills | Status: DC

## 2023-03-06 MED ORDER — ZINC SULFATE 220 (50 ZN) MG PO CAPS: 220.0000 mg | ORAL_CAPSULE | Freq: Every day | ORAL | Status: DC

## 2023-03-06 MED ORDER — JUVEN PO PACK: 1.0000 | PACK | Freq: Two times a day (BID) | ORAL | 0 refills | Status: DC

## 2023-03-06 MED ORDER — INSULIN GLARGINE-YFGN 100 UNIT/ML ~~LOC~~ SOLN: 35.0000 [IU] | Freq: Every day | SUBCUTANEOUS | 11 refills | Status: DC

## 2023-03-06 MED ORDER — ONDANSETRON HCL 4 MG PO TABS: 4.0000 mg | ORAL_TABLET | Freq: Four times a day (QID) | ORAL | 0 refills | Status: DC | PRN

## 2023-03-06 MED ORDER — GUAIFENESIN-DM 100-10 MG/5ML PO SYRP: 15.0000 mL | ORAL_SOLUTION | ORAL | 0 refills | Status: DC | PRN

## 2023-03-06 NOTE — TOC Progression Note (Signed)
Transition of Care [facility]) - Progression Note    Patient Details  Name: Ryan [name] MRN: [id number] Date of Birth: January 26, 1966  [facility]) CM/SW Contact  Aneeka Bowden A [place], [place] Phone Number: 03/06/2023, 3:52 PM  Clinical Narrative:     Patient will DC to: [facility]  Anticipated DC date: 03/06/23  Family notified: Charmian Muff  Transport by: Clearence Cheek, private vehicle  Reference ID:  [id number]   Per MD patient ready for DC to [facility]. RN, patient, patient's family, and facility notified of DC. Discharge Summary and FL2 sent to facility. RN to call report prior to discharge ( Room 111-A, [phone]). DC packet on chart. Ambulance transport requested for patient.     CSW will sign off for now as social work intervention is no longer needed. Please consult [place] again if new needs arise.    Expected Discharge Plan: Skilled Nursing Facility Barriers to Discharge: Barriers Resolved  Expected Discharge Plan and Services In-house Referral: Clinical Social Work Discharge Planning Services: Other - See comment (TOC for coordination of care needs.) Post Acute Care Choice: Skilled Nursing Facility Living arrangements for the past 2 months: Single Family Home Expected Discharge Date: 03/06/23                                     Social Determinants of Health (SDOH) Interventions SDOH Screenings   Food Insecurity: Food Insecurity Present (02/24/2023)  Housing: Low Risk  (02/24/2023)  Transportation Needs: No Transportation Needs (02/24/2023)  Utilities: Not At Risk (02/24/2023)  Tobacco Use: High Risk (03/02/2023)    Readmission Risk Interventions     No data to display

## 2023-03-06 NOTE — Progress Notes (Signed)
Occupational Therapy Treatment Patient Details Name: Ryan [name] MRN: [id number] DOB: Feb 24, 1966 Today's Date: 03/06/2023   History of present illness Pt is 57 y.o.M admitted on 02/24/23 due to cellulitis, abscesses and multiple wounds. 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   OT comments  Patient seated on EOB upon arrival. Patient able to donn shoe and performed mobility in room with rollator to simulate toilet transfers and ADL mobility. Patient stated fatigue in BUEs and was provided UE HEP and therapy band. Patient instructed on HEP and demonstrated good understanding. Patient will benefit from continued inpatient follow up therapy, <3 hours/day. Acute OT to continue to follow.    Recommendations for follow up therapy are one component of a multi-disciplinary discharge planning process, led by the attending physician.  Recommendations may be updated based on patient status, additional functional criteria and insurance authorization.    Assistance Recommended at Discharge Frequent or constant Supervision/Assistance  Patient can return home with the following  A little help with bathing/dressing/bathroom;Help with stairs or ramp for entrance;Assistance with cooking/housework;A little help with walking and/or transfers   Equipment Recommendations  BSC/3in1    Recommendations for Other Services      Precautions / Restrictions Precautions Precautions: Fall Precaution Comments: wound vac, limb protector (pt decines to wear it) Restrictions Weight Bearing Restrictions: Yes RLE Weight Bearing: Non weight bearing       Mobility Bed Mobility Overal bed mobility: Needs Assistance             General bed mobility comments: seated on EOB upon arrival and at end of session    Transfers Overall transfer level: Needs assistance Equipment  used: Rollator (4 wheels) Transfers: Sit to/from Stand Sit to Stand: Min guard           General transfer comment: min guard for safety and cues for safe use of rollator     Balance Overall balance assessment: Needs assistance Sitting-balance support: No upper extremity supported, Feet supported Sitting balance-Leahy Scale: Good     Standing balance support: Bilateral upper extremity supported, During functional activity Standing balance-Leahy Scale: Poor Standing balance comment: Reliant on rollator                           ADL either performed or assessed with clinical judgement   ADL Overall ADL's : Needs assistance/impaired     Grooming: Wash/dry hands;Wash/dry face;Standing;Min guard               Lower Body Dressing: Min guard;Sitting/lateral leans Lower Body Dressing Details (indicate cue type and reason): donned shoe Toilet Transfer: Min guard;Ambulation;Rollator (4 wheels) Toilet Transfer Details (indicate cue type and reason): simulated           General ADL Comments: assistance due to safety    Extremity/Trunk Assessment              Vision       Perception     Praxis      Cognition Arousal/Alertness: Awake/alert Behavior During Therapy: Impulsive Overall Cognitive Status: No family/caregiver present to determine baseline cognitive functioning                                 General Comments: unplugs wound vac and declines limb protector  use, poor safety        Exercises Exercises: General Upper Extremity General Exercises - Upper Extremity Shoulder Flexion: Strengthening, Both, Seated, Theraband Theraband Level (Shoulder Flexion): Level 2 (Red) Shoulder Horizontal ABduction: Strengthening, Both, 10 reps, Seated, Theraband Theraband Level (Shoulder Horizontal Abduction): Level 2 (Red)    Shoulder Instructions       General Comments therapy band and HEP provided to patient    Pertinent Vitals/  Pain       Pain Assessment Pain Assessment: Faces Faces Pain Scale: Hurts little more Pain Location: R residual limb Pain Descriptors / Indicators: Aching, Discomfort, Sore Pain Intervention(s): Limited activity within patient's tolerance, Monitored during session, Repositioned  Home Living                                          Prior Functioning/Environment              Frequency  Min 2X/week        Progress Toward Goals  OT Goals(current goals can now be found in the care plan section)  Progress towards OT goals: Progressing toward goals  Acute Rehab OT Goals Patient Stated Goal: get stronger OT Goal Formulation: With patient Time For Goal Achievement: 03/14/23 Potential to Achieve Goals: Good ADL Goals Pt Will Perform Grooming: with modified independence;standing Pt Will Perform Lower Body Bathing: with modified independence;sitting/lateral leans;sit to/from stand Pt Will Perform Lower Body Dressing: with modified independence;sitting/lateral leans;sit to/from stand Pt Will Transfer to Toilet: with modified independence;ambulating Pt Will Perform Toileting - Clothing Manipulation and hygiene: with modified independence;sitting/lateral leans;sit to/from stand  Plan Discharge plan remains appropriate    Co-evaluation                 AM-PAC OT "6 Clicks" Daily Activity     Outcome Measure   Help from another person eating meals?: None Help from another person taking care of personal grooming?: A Little Help from another person toileting, which includes using toliet, bedpan, or urinal?: A Little Help from another person bathing (including washing, rinsing, drying)?: A Little Help from another person to put on and taking off regular upper body clothing?: A Little Help from another person to put on and taking off regular lower body clothing?: A Little 6 Click Score: 19    End of Session Equipment Utilized During Treatment: Rollator (4  wheels);Gait belt  OT Visit Diagnosis: Unsteadiness on feet (R26.81);Other abnormalities of gait and mobility (R26.89);Muscle weakness (generalized) (M62.81)   Activity Tolerance Patient tolerated treatment well   Patient Left in bed;with call bell/phone within reach (sitting on EOB awaiting breakfast)   Nurse Communication Mobility status        Time: 1191-4782 OT Time Calculation (min): 34 min  Charges: OT General Charges $OT Visit: 1 Visit OT Treatments $Self Care/Home Management : 8-22 mins $Therapeutic Exercise: 8-22 mins  Alfonse Flavors, OTA Acute Rehabilitation Services  Office [phone]   Dewain Penning 03/06/2023, 10:36 AM

## 2023-03-06 NOTE — Discharge Summary (Signed)
Physician Discharge Summary  Ryan [name] UEA:[id number] DOB: 24-Jun-1966 DOA: 02/24/2023  PCP: Hortencia Conradi, NP  Admit date: 02/24/2023 Discharge date: 03/06/2023  Admitted from: home Discharge disposition: SNF  Recommendations at discharge:  Doxycycline 100 mg twice daily for 2 weeks with probiotics Follow-up with orthopedics as an outpatient Follow-up with your prosthetist as an outpatient. Encourage compliance to carb restricted diet and insulin  Brief narrative: Ryan [name] is a 57 y.o. male with PMH significant for uncontrolled DM2, right leg osteomyelitis s/p right BKA 2022, chronic pain syndrome on chronic opiates, chronic smoker 4/16, patient presented to the ED with complaint of multiple wounds on his right shoulder, right leg, left buttocks Patient seems to be noncompliant to follow ups and medications except for his pain medicines. Few weeks ago, patient noticed a boil on his right shoulder which got worse.  Few days ago, he noticed a similar boil on the right buttock.  He also noticed a sore spot on his right BKA stump that is not draining.  In the ED, afebrile, hemodynamically stable Initial labs with WBC count 11.9, hemoglobin 11.4, lactic acid 3, sodium 127, glucose elevated at 648 CRP 11.2, ESR elevated to 113 Urine ketones negative Right femur x-ray showed below-knee amputation.  No acute osseous lesions. Admitted to [facility] General surgery and orthopedics were consulted. 4/19, he had a combined procedure by draining which he underwent I&D and sharp debridement of right posterior shoulder and left buttock abscess followed by revision of right BKA. See below for details.  Subjective: Patient was seen and examined this morning. Ready for discharge to SNF today.  Hospital course: Left buttock abscess Right upper back/shoulder wound IND of both wounds by general surgery on 4/19 Wet-to-dry dressing change per general surgery. OR cultures growing  MRSA. Currently on IV Rocephin and IV vancomycin.  ID consult obtained.  Recommended doxycycline twice daily for 2 weeks. Per general surgery, wound dressing twice a day to continue.  Outpatient follow-up in 3 weeks  Right BKA stump cellulitis/drainage 4/19, revision of right BKA by Dr. Lajoyce Corners.  Tissue culture and bone culture pending.  Wound VAC was placed.  Wound VAC removed per Dr. Audrie Lia recommendation today.  Recommended against kneeling scooter until the skin is healed.  He complains about limb protector not fitting properly.  He discussed with his prosthetist at [facility] for a new ambushield.  He needs to follow-up as an outpatient for that.  Type 2 diabetes mellitus uncontrolled with hyperglycemia A1c uncontrolled, more than 15 Not taking any medicines at home Currently on Lantus to 35 units, scheduled Premeal aspart 15 units 3 times daily along with sliding scale insulin.  Patient is not following dietary restriction.  Sugar level running elevated over 200 consistently.  Diabetes care coordinator consult appreciated. Recent Labs  Lab 02/28/23 2018 03/01/23 0816 03/01/23 2135 03/02/23 0812 03/03/23 2201  GLUCAP 338* 178* 343* 157* 368*   Chronic pain syndrome  Chronic opiate use  sees pain clinic in [place], [place]. PTA on Neurontin and oxycodone 15 mg q3hrs Continue same.  Tobacco use disorder Continues to smoke 1 pack a day.  Wants to smoke while in the hospital.  Not happy when I did not let him go out for smoking.  Nicotine patch offered.  Left lower extremity swelling Patient with pretty significant left lower extremity edema. US duplex scan negative for DVT. Echocardiogram with normal LVEF, RV function and size.  Gently diuresed with IV Lasix.  It was held after creatinine  climbed up.  Encouraged to keep legs elevated. Recent Labs    02/24/23 1735 02/25/23 0429 03/02/23 0331 03/03/23 0532 03/04/23 0417 03/05/23 0611 03/06/23 0727  BUN 17 13 23* 26* 29* 27* 26*   CREATININE 1.13 0.87 1.06 1.09 1.34* 1.07 1.12   Impaired mobility PT eval obtained.  SNF recommended   Goals of care   Code Status: Full Code   Wounds:  - Incision 04/07/12 Back Other (Comment) (Active)  Date First Assessed/Time First Assessed: 04/07/12 1243   Location: Back  Location Orientation: Other (Comment)    Assessments 04/07/2012 12:53 PM 04/08/2012  8:55 AM  Dressing Clean;Dry;Intact Clean;Dry;Intact  Site / Wound Assessment Clean;Dry Clean;Dry  Margins Attached edges (approximated) --  Closure Adhesive strips;Approximated;Skin glue --     No associated orders.     Wound / Incision (Open or Dehisced) 02/25/23 Other (Comment) Pretibial Right;Posterior (Active)  Date First Assessed/Time First Assessed: 02/25/23 0302   Wound Type: Other (Comment)  Location: Pretibial  Location Orientation: Right;Posterior  Present on Admission: Yes    Assessments 02/25/2023  4:00 PM 03/05/2023  9:11 PM  Dressing Type Foam - Lift dressing to assess site every shift Foam - Lift dressing to assess site every shift  Dressing Changed New --  Dressing Status Clean, Dry, Intact Clean, Dry, Intact     No associated orders.     Wound / Incision (Open or Dehisced) 02/25/23 Other (Comment) Back Lateral;Right;Upper (Active)  Date First Assessed/Time First Assessed: 02/25/23 0303   Wound Type: Other (Comment)  Location: Back  Location Orientation: Lateral;Right;Upper  Present on Admission: Yes    Assessments 02/25/2023  4:00 PM 03/05/2023  9:11 PM  Dressing Type Foam - Lift dressing to assess site every shift Moist to dry  Dressing Changed New --  Dressing Status Clean, Dry, Intact Clean, Dry, Intact     No associated orders.     Wound / Incision (Open or Dehisced) 02/25/23 Other (Comment) Hip Left (Active)  Date First Assessed/Time First Assessed: 02/25/23 0304   Wound Type: Other (Comment)  Location: Hip  Location Orientation: Left  Present on Admission: Yes    Assessments 02/25/2023  4:00 PM  03/05/2023  9:11 PM  Dressing Type Foam - Lift dressing to assess site every shift Honey;Abdominal pads;Normal saline moist dressing  Dressing Changed New --  Dressing Status Clean, Dry, Intact Clean, Dry, Intact  Dressing Change Frequency -- Every 3 days  Drainage Amount -- None     No associated orders.     Incision (Closed) 02/27/23 Shoulder Right (Active)  Date First Assessed/Time First Assessed: 02/27/23 1145   Location: Shoulder  Location Orientation: Right    Assessments 02/27/2023 12:00 PM 03/05/2023  9:11 PM  Dressing Type Gauze (Comment);Abdominal pads Moist to dry;Gauze (Comment);Abdominal pads;Dakin's-soaked gauze  Dressing Clean, Dry, Intact Clean, Dry, Intact  Dressing Change Frequency -- Every 3 days  Site / Wound Assessment Dressing in place / Unable to assess Dressing in place / Unable to assess     No associated orders.     Incision (Closed) 02/27/23 Buttocks Left (Active)  Date First Assessed/Time First Assessed: 02/27/23 1145   Location: Buttocks  Location Orientation: Left    Assessments 02/27/2023 12:00 PM 03/05/2023  9:11 PM  Dressing Type Gauze (Comment);Abdominal pads Moist to dry;Abdominal pads;Gauze (Comment)  Dressing Clean, Dry, Intact Clean, Dry, Intact  Dressing Change Frequency -- Twice a day  Site / Wound Assessment Dressing in place / Unable to assess Dressing in place / Unable  to assess     No associated orders.     Incision (Closed) 02/27/23 Knee Left (Active)  Date First Assessed/Time First Assessed: 02/27/23 1145   Location: Knee  Location Orientation: Left    Assessments 02/27/2023 12:00 PM 03/05/2023  9:11 PM  Dressing Type Negative pressure wound therapy Negative pressure wound therapy  Dressing Clean, Dry, Intact Clean, Dry, Intact  Site / Wound Assessment Dressing in place / Unable to assess --  Drainage Amount None --     No associated orders.    Discharge Exam:   Vitals:   03/05/23 1934 03/06/23 0440 03/06/23 0500 03/06/23 0719  BP:  (!) 140/75 137/87  126/81  Pulse: 98 90  89  Resp:    16  Temp: 98.1 F (36.7 C) 98.2 F (36.8 C)  98 F (36.7 C)  TempSrc: Oral Oral  Oral  SpO2: 99% 100%  99%  Weight:   98.4 kg   Height:        Body mass index is 28.62 kg/m.   General exam: Middle-aged Caucasian male. Pain partially controlled.  Not in distress. Skin: No rashes, lesions or ulcers. HEENT: Atraumatic, normocephalic, no obvious bleeding Lungs: Clear to auscultate bilaterally CVS: Regular rate and rhythm, no murmur GI/Abd soft, nontender, nondistended, bowel sound present CNS: Alert, awake, slow to respond, oriented x 3 Psychiatry: Mood appropriate.   Extremities: Right BKA revision with bandage to stump. Right shoulder area with postsurgical status. Left lower extremity 1-2+ pedal edema present  Follow ups:    Contact information for follow-up providers     Nadara Mustard, MD Follow up in 1 week(s).   Specialty: Orthopedic Surgery Contact information: [address] [place] [address] [phone]         Reine Just, PA-C Follow up.   Specialty: General Surgery Why: our office is scheduling you for a wound check of your back and butock in about 3 weeks. please call to confirm appointment  date/time. Contact information: [address] [place] [address] [phone]         Hortencia Conradi, NP Follow up.   Contact information: [address] [place] [address] [phone]              Contact information for after-discharge care     Destination     [facility] .   Service: Skilled Nursing Contact information: [address] [place] [address] [phone]                     Discharge Instructions:   Discharge Instructions     Call MD for:  difficulty breathing, headache or visual disturbances   Complete by: As directed    Call MD for:  extreme fatigue   Complete by: As  directed    Call MD for:  hives   Complete by: As directed    Call MD for:  persistant dizziness or light-headedness   Complete by: As directed    Call MD for:  persistant nausea and vomiting   Complete by: As directed    Call MD for:  severe uncontrolled pain   Complete by: As directed    Call MD for:  temperature >100.4   Complete by: As directed    Diet general   Complete by: As directed    Discharge instructions   Complete by: As directed    Recommendations at discharge:   Doxycycline 100  mg twice daily for 2 weeks with probiotics  Follow-up with orthopedics as an outpatient  Follow-up with your prosthetist as an outpatient.  Encourage compliance to carb restricted diet and insulin  Discharge instructions for diabetes mellitus: Check blood sugar 3 times a day and bedtime at home. If blood sugar running above 200 or less than 70 please call your MD to adjust insulin. If you notice signs and symptoms of hypoglycemia (low blood sugar) like jitteriness, confusion, thirst, tremor and sweating, please check blood sugar, drink sugary drink/biscuits/sweets to increase sugar level and call MD or return to ER.    General discharge instructions: Follow with Primary MD Hortencia Conradi, NP in 7 days  Please request your PCP  to go over your hospital tests, procedures, radiology results at the follow up. Please get your medicines reviewed and adjusted.  Your PCP may decide to repeat certain labs or tests as needed. Do not drive, operate heavy machinery, perform activities at heights, swimming or participation in water activities or provide baby sitting services if your were admitted for syncope or siezures until you have seen by Primary MD or a Neurologist and advised to do so again. [place] Controlled Substance Reporting System database was reviewed. Do not drive, operate heavy machinery, perform activities at heights, swim, participate in water activities or provide baby-sitting  services while on medications for pain, sleep and mood until your outpatient physician has reevaluated you and advised to do so again.  You are strongly recommended to comply with the dose, frequency and duration of prescribed medications. Activity: As tolerated with Full fall precautions use walker/cane & assistance as needed Avoid using any recreational substances like cigarette, tobacco, alcohol, or non-prescribed drug. If you experience worsening of your admission symptoms, develop shortness of breath, life threatening emergency, suicidal or homicidal thoughts you must seek medical attention immediately by calling 911 or calling your MD immediately  if symptoms less severe. You must read complete instructions/literature along with all the possible adverse reactions/side effects for all the medicines you take and that have been prescribed to you. Take any new medicine only after you have completely understood and accepted all the possible adverse reactions/side effects.  Wear Seat belts while driving. You were cared for by a hospitalist during your hospital stay. If you have any questions about your discharge medications or the care you received while you were in the hospital after you are discharged, you can call the unit and ask to speak with the hospitalist or the covering physician. Once you are discharged, your primary care physician will handle any further medical issues. Please note that NO REFILLS for any discharge medications will be authorized once you are discharged, as it is imperative that you return to your primary care physician (or establish a relationship with a primary care physician if you do not have one).   Discharge wound care:   Complete by: As directed    Increase activity slowly   Complete by: As directed        Discharge Medications:   Allergies as of 03/06/2023       Reactions   Acetaminophen Nausea And Vomiting        Medication List     TAKE these  medications    acetaminophen 325 MG tablet Commonly known as: TYLENOL Take 1-2 tablets (325-650 mg total) by mouth every 6 (six) hours as needed for mild pain (pain score 1-3 or temp > 100.5).   bisacodyl 5 MG EC tablet  Commonly known as: DULCOLAX Take 1 tablet (5 mg total) by mouth daily as needed for moderate constipation.   docusate sodium 100 MG capsule Commonly known as: COLACE Take 1 capsule (100 mg total) by mouth daily. Start taking on: March 07, 2023   doxycycline 100 MG tablet Commonly known as: VIBRA-TABS Take 1 tablet (100 mg total) by mouth every 12 (twelve) hours for 14 days.   gabapentin 800 MG tablet Commonly known as: NEURONTIN Take 800 mg by mouth 3 (three) times daily.   guaiFENesin-dextromethorphan 100-10 MG/5ML syrup Commonly known as: ROBITUSSIN DM Take 15 mLs by mouth every 4 (four) hours as needed for cough.   insulin aspart 100 UNIT/ML injection Commonly known as: novoLOG Inject 0-15 Units into the skin 3 (three) times daily with meals.   insulin aspart 100 UNIT/ML injection Commonly known as: novoLOG Inject 0-5 Units into the skin at bedtime.   insulin aspart 100 UNIT/ML injection Commonly known as: novoLOG Inject 15 Units into the skin 3 (three) times daily with meals.   insulin glargine-yfgn 100 UNIT/ML injection Commonly known as: SEMGLEE Inject 0.35 mLs (35 Units total) into the skin at bedtime.   leptospermum manuka honey Pste paste Apply 1 Application topically daily. Start taking on: March 07, 2023   leptospermum manuka honey Pste paste Apply 1 Application topically daily. Start taking on: March 07, 2023   naloxone 4 MG/0.1ML Liqd nasal spray kit Commonly known as: NARCAN Place 1 spray into the nose once.   nutrition supplement (JUVEN) Pack Take 1 packet by mouth 2 (two) times daily between meals.   ondansetron 4 MG tablet Commonly known as: ZOFRAN Take 1 tablet (4 mg total) by mouth every 6 (six) hours as needed for  nausea.   oxyCODONE 15 MG immediate release tablet Commonly known as: ROXICODONE Take 1 tablet (15 mg total) by mouth every 4 (four) hours as needed for up to 3 days for moderate pain or severe pain. What changed:  when to take this reasons to take this additional instructions   pantoprazole 40 MG tablet Commonly known as: PROTONIX Take 1 tablet (40 mg total) by mouth daily. Start taking on: March 07, 2023   polyethylene glycol 17 g packet Commonly known as: MIRALAX / GLYCOLAX Take 17 g by mouth daily as needed for mild constipation.   Zinc Oxide 12.8 % ointment Commonly known as: TRIPLE PASTE Apply topically 2 (two) times daily.   zinc sulfate 220 (50 Zn) MG capsule Take 1 capsule (220 mg total) by mouth daily. Start taking on: March 07, 2023               Discharge Care Instructions  (From admission, onward)           Start     Ordered   03/06/23 0000  Discharge wound care:        03/06/23 1418             The results of significant diagnostics from this hospitalization (including imaging, microbiology, ancillary and laboratory) are listed below for reference.    Procedures and Diagnostic Studies:   CT KNEE RIGHT W CONTRAST  Result Date: 02/26/2023 CLINICAL DATA:  Below the knee amputation with amputation site wound. Evaluate for underlying abscess or osteomyelitis. EXAM: CT OF THE RIGHT KNEE WITH CONTRAST TECHNIQUE: Multidetector CT imaging was performed following the standard protocol during bolus administration of intravenous contrast. RADIATION DOSE REDUCTION: This exam was performed according to the departmental dose-optimization program which includes automated  exposure control, adjustment of the mA and/or kV according to patient size and/or use of iterative reconstruction technique. CONTRAST:  75mL OMNIPAQUE IOHEXOL 350 MG/ML SOLN COMPARISON:  Radiographs 02/24/2023. FINDINGS: Bones/Joint/Cartilage Status post below the knee amputation. Along the  lateral aspect of the tibial stump, there is an ill-defined osseous erosion measuring approximately 9 mm on coronal image 110/6. The amputation margins of the distal tibia and fibula are otherwise smooth without additional areas of bone destruction. The bones are demineralized. The distal femur and patella are unremarkable in appearance. There is a small nonspecific knee joint effusion. Ligaments Suboptimally assessed by CT. The cruciate ligaments are grossly intact. Muscles and Tendons The quadriceps and patellar tendons are intact. Mild atrophy within the lower leg musculature. No focal intramuscular fluid collections or suspicious enhancement identified. Soft tissues There are several ill-defined low-density collections within the soft tissues along the anterolateral aspect of the distal tibial stump. There is a distal component measuring up to 2.3 cm on coronal image 103/8. Anterolaterally, there is a component measuring up to 2.5 cm on image 115/5. There is mild peripheral enhancement of these collections which are not well defined. No associated foreign body or soft tissue emphysema. No proximal fluid collections are identified. There is generalized subcutaneous edema throughout the remaining lower leg and visualized distal thigh. Femoral atherosclerosis noted. IMPRESSION: 1. Ill-defined low-density collections within the soft tissues along the anterolateral aspect of the distal tibial stump, suspicious for small abscesses. 2. Small ill-defined osseous erosion along the lateral aspect of the tibial stump suspicious for osteomyelitis. No other suspicious osseous findings post below the knee amputation. 3. Generalized subcutaneous edema throughout the remaining lower leg and visualized distal thigh. 4. Nonspecific small knee joint effusion. Electronically Signed   By: Carey Bullocks M.D.   On: 02/26/2023 08:02   CT Chest W Contrast  Result Date: 02/24/2023 CLINICAL DATA:  Chest wall pain EXAM: CT CHEST WITH  CONTRAST TECHNIQUE: Multidetector CT imaging of the chest was performed during intravenous contrast administration. RADIATION DOSE REDUCTION: This exam was performed according to the departmental dose-optimization program which includes automated exposure control, adjustment of the mA and/or kV according to patient size and/or use of iterative reconstruction technique. CONTRAST:  50mL OMNIPAQUE IOHEXOL 350 MG/ML SOLN COMPARISON:  CT abdomen 05/03/2009 and previous FINDINGS: Cardiovascular: Heart size upper limits normal. Chronic anterior pericardial calcification without effusion. Scattered left coronary calcifications. Atheromatous descending thoracic aorta without aneurysm. Mediastinum/Nodes: No mass or adenopathy. Lungs/Pleura: No pleural effusion. No pneumothorax. Minimal dependent atelectasis at the left lung base. Lungs otherwise clear. Upper Abdomen: No acute findings. Stable left upper pole renal cyst; no follow-up recommended. Musculoskeletal: Cervical fixation hardware partially visualized. Bilateral sternoclavicular DJD. Sternotomy wires. Vertebral endplate spurring at multiple levels in the lower thoracic spine. IMPRESSION: 1. No acute findings. 2. Coronary and Aortic Atherosclerosis (ICD10-170.0). Electronically Signed   By: Corlis Leak M.D.   On: 02/24/2023 20:37   DG Chest 1 View  Result Date: 02/24/2023 CLINICAL DATA:  Right leg stump pain EXAM: CHEST  1 VIEW COMPARISON:  07/09/2022 FINDINGS: Lungs are essentially clear. Left infrahilar opacity, likely atelectasis. No pleural effusion or pneumothorax. The heart is normal in size. Median sternotomy. IMPRESSION: No acute cardiopulmonary disease. Electronically Signed   By: Charline Bills M.D.   On: 02/24/2023 20:21   DG Knee 2 Views Right  Result Date: 02/24/2023 CLINICAL DATA:  Stump pain of right leg below-knee EXAM: RIGHT KNEE - 1-2 VIEW; RIGHT FEMUR 2 VIEWS; PELVIS - 1-2  VIEW COMPARISON:  None Available. FINDINGS: No acute fracture or  dislocation in the pelvis. Degenerative changes pubic symphysis, both hips, SI joints and lower lumbar spine. No acute fracture of the right femur. Right below-the-knee amputation. No acute fracture or dislocation of the right knee. No effusion. No evidence of osteomyelitis about the stump. Soft tissues are unremarkable. Vascular calcifications. The lateral view is limited due to external material projecting over the knee. IMPRESSION: 1. No acute fracture or dislocation in the pelvis, right femur, or knee. 2. Right below-the-knee amputation.  No acute osseous abnormality. Electronically Signed   By: Minerva Fester M.D.   On: 02/24/2023 20:20   DG Femur Min 2 Views Right  Result Date: 02/24/2023 CLINICAL DATA:  Stump pain of right leg below-knee EXAM: RIGHT KNEE - 1-2 VIEW; RIGHT FEMUR 2 VIEWS; PELVIS - 1-2 VIEW COMPARISON:  None Available. FINDINGS: No acute fracture or dislocation in the pelvis. Degenerative changes pubic symphysis, both hips, SI joints and lower lumbar spine. No acute fracture of the right femur. Right below-the-knee amputation. No acute fracture or dislocation of the right knee. No effusion. No evidence of osteomyelitis about the stump. Soft tissues are unremarkable. Vascular calcifications. The lateral view is limited due to external material projecting over the knee. IMPRESSION: 1. No acute fracture or dislocation in the pelvis, right femur, or knee. 2. Right below-the-knee amputation.  No acute osseous abnormality. Electronically Signed   By: Minerva Fester M.D.   On: 02/24/2023 20:20   DG Pelvis 1-2 Views  Result Date: 02/24/2023 CLINICAL DATA:  Stump pain of right leg below-knee EXAM: RIGHT KNEE - 1-2 VIEW; RIGHT FEMUR 2 VIEWS; PELVIS - 1-2 VIEW COMPARISON:  None Available. FINDINGS: No acute fracture or dislocation in the pelvis. Degenerative changes pubic symphysis, both hips, SI joints and lower lumbar spine. No acute fracture of the right femur. Right below-the-knee amputation.  No acute fracture or dislocation of the right knee. No effusion. No evidence of osteomyelitis about the stump. Soft tissues are unremarkable. Vascular calcifications. The lateral view is limited due to external material projecting over the knee. IMPRESSION: 1. No acute fracture or dislocation in the pelvis, right femur, or knee. 2. Right below-the-knee amputation.  No acute osseous abnormality. Electronically Signed   By: Minerva Fester M.D.   On: 02/24/2023 20:20     Labs:   Basic Metabolic Panel: Recent Labs  Lab 03/02/23 0331 03/03/23 0532 03/04/23 0417 03/05/23 0611 03/06/23 0727  NA 133* 132* 133* 134* 135  K 4.0 3.5 4.2 4.6 4.3  CL 100 100 99 98 100  CO2 25 24 25 29 26   GLUCOSE 235* 114* 266* 249* 135*  BUN 23* 26* 29* 27* 26*  CREATININE 1.06 1.09 1.34* 1.07 1.12  CALCIUM 8.7* 8.6* 8.6* 9.0 8.9   GFR Estimated Creatinine Clearance: 90.9 mL/min (by C-G formula based on SCr of 1.12 mg/dL). Liver Function Tests: No results for input(s): "AST", "ALT", "ALKPHOS", "BILITOT", "PROT", "ALBUMIN" in the last 168 hours. No results for input(s): "LIPASE", "AMYLASE" in the last 168 hours. No results for input(s): "AMMONIA" in the last 168 hours. Coagulation profile No results for input(s): "INR", "PROTIME" in the last 168 hours.  CBC: Recent Labs  Lab 03/02/23 0331 03/03/23 0532 03/04/23 0417 03/05/23 0611 03/06/23 0727  WBC 9.8 10.8* 11.0* 8.8 8.7  HGB 10.2* 9.6* 10.5* 9.7* 9.8*  HCT 30.1* 28.2* 33.2* 30.5* 29.6*  MCV 87.2 88.1 91.0 91.3 89.2  PLT 441* 422* 508* 473* 468*  Cardiac Enzymes: No results for input(s): "CKTOTAL", "CKMB", "CKMBINDEX", "TROPONINI" in the last 168 hours. BNP: Invalid input(s): "POCBNP" CBG: Recent Labs  Lab 02/28/23 2018 03/01/23 0816 03/01/23 2135 03/02/23 0812 03/03/23 2201  GLUCAP 338* 178* 343* 157* 368*   D-Dimer No results for input(s): "DDIMER" in the last 72 hours. Hgb A1c No results for input(s): "HGBA1C" in the last 72  hours. Lipid Profile No results for input(s): "CHOL", "HDL", "LDLCALC", "TRIG", "CHOLHDL", "LDLDIRECT" in the last 72 hours. Thyroid function studies No results for input(s): "TSH", "T4TOTAL", "T3FREE", "THYROIDAB" in the last 72 hours.  Invalid input(s): "FREET3" Anemia work up No results for input(s): "VITAMINB12", "FOLATE", "FERRITIN", "TIBC", "IRON", "RETICCTPCT" in the last 72 hours. Microbiology Recent Results (from the past 240 hour(s))  Resp panel by RT-PCR (RSV, Flu A&B, Covid) Anterior Nasal Swab     Status: None   Collection Time: 02/24/23  7:20 PM   Specimen: Anterior Nasal Swab  Result Value Ref Range Status   SARS Coronavirus 2 by RT PCR NEGATIVE NEGATIVE Final   Influenza A by PCR NEGATIVE NEGATIVE Final   Influenza B by PCR NEGATIVE NEGATIVE Final    Comment: (NOTE) The Xpert Xpress SARS-CoV-2/FLU/RSV plus assay is intended as an aid in the diagnosis of influenza from Nasopharyngeal swab specimens and should not be used as a sole basis for treatment. Nasal washings and aspirates are unacceptable for Xpert Xpress SARS-CoV-2/FLU/RSV testing.  Fact Sheet for Patients: BloggerCourse.com  Fact Sheet for Healthcare Providers: SeriousBroker.it  This test is not yet approved or cleared by the [place] FDA and has been authorized for detection and/or diagnosis of SARS-CoV-2 by FDA under an Emergency Use Authorization (EUA). This EUA will remain in effect (meaning this test can be used) for the duration of the COVID-19 declaration under Section 564(b)(1) of the Act, 21 U.S.C. section 360bbb-3(b)(1), unless the authorization is terminated or revoked.     Resp Syncytial Virus by PCR NEGATIVE NEGATIVE Final    Comment: (NOTE) Fact Sheet for Patients: BloggerCourse.com  Fact Sheet for Healthcare Providers: SeriousBroker.it  This test is not yet approved or cleared  by the [place] FDA and has been authorized for detection and/or diagnosis of SARS-CoV-2 by FDA under an Emergency Use Authorization (EUA). This EUA will remain in effect (meaning this test can be used) for the duration of the COVID-19 declaration under Section 564(b)(1) of the Act, 21 U.S.C. section 360bbb-3(b)(1), unless the authorization is terminated or revoked.  Performed at [facility], [address], [place], [place] [address]   Blood Culture (routine x 2)     Status: None   Collection Time: 02/24/23  7:32 PM   Specimen: BLOOD  Result Value Ref Range Status   Specimen Description BLOOD LEFT ANTECUBITAL  Final   Special Requests   Final    BOTTLES DRAWN AEROBIC AND ANAEROBIC Blood Culture adequate volume   Culture   Final    NO GROWTH 5 DAYS Performed at [facility], [address], [place], [place] [address]    Report Status 03/01/2023 FINAL  Final  Blood Culture (routine x 2)     Status: None   Collection Time: 02/24/23  7:46 PM   Specimen: BLOOD LEFT FOREARM  Result Value Ref Range Status   Specimen Description BLOOD LEFT FOREARM  Final   Special Requests   Final    BOTTLES DRAWN AEROBIC AND ANAEROBIC Blood Culture results may not be optimal due to an excessive volume of  blood received in culture bottles   Culture   Final    NO GROWTH 5 DAYS Performed at [facility], [address], [place], [place] [address]    Report Status 03/01/2023 FINAL  Final  Aerobic/Anaerobic Culture w Gram Stain (surgical/deep wound)     Status: None   Collection Time: 02/27/23 11:23 AM   Specimen: Soft Tissue, Other  Result Value Ref Range Status   Specimen Description TISSUE  Final   Special Requests NONE  Final   Gram Stain NO WBC SEEN NO ORGANISMS SEEN   Final   Culture   Final    RARE STAPHYLOCOCCUS AUREUS FEW STAPHYLOCOCCUS EPIDERMIDIS NO ANAEROBES ISOLATED Performed at [facility], [address] [place], [place] [address]    Report Status  03/04/2023 FINAL  Final   Organism ID, Bacteria STAPHYLOCOCCUS AUREUS  Final   Organism ID, Bacteria STAPHYLOCOCCUS EPIDERMIDIS  Final      Susceptibility   Staphylococcus aureus - MIC*    CIPROFLOXACIN >=8 RESISTANT Resistant     ERYTHROMYCIN >=8 RESISTANT Resistant     GENTAMICIN <=0.5 SENSITIVE Sensitive     OXACILLIN 0.5 SENSITIVE Sensitive     TETRACYCLINE <=1 SENSITIVE Sensitive     VANCOMYCIN 1 SENSITIVE Sensitive     TRIMETH/SULFA <=10 SENSITIVE Sensitive     CLINDAMYCIN >=8 RESISTANT Resistant     RIFAMPIN <=0.5 SENSITIVE Sensitive     Inducible Clindamycin NEGATIVE Sensitive     * RARE STAPHYLOCOCCUS AUREUS   Staphylococcus epidermidis - MIC*    CIPROFLOXACIN <=0.5 SENSITIVE Sensitive     ERYTHROMYCIN >=8 RESISTANT Resistant     GENTAMICIN <=0.5 SENSITIVE Sensitive     OXACILLIN >=4 RESISTANT Resistant     TETRACYCLINE 2 SENSITIVE Sensitive     VANCOMYCIN 1 SENSITIVE Sensitive     TRIMETH/SULFA >=320 RESISTANT Resistant     CLINDAMYCIN >=8 RESISTANT Resistant     RIFAMPIN <=0.5 SENSITIVE Sensitive     Inducible Clindamycin NEGATIVE Sensitive     * FEW STAPHYLOCOCCUS EPIDERMIDIS  Aerobic/Anaerobic Culture w Gram Stain (surgical/deep wound)     Status: None   Collection Time: 02/27/23 11:25 AM   Specimen: Soft Tissue, Other  Result Value Ref Range Status   Specimen Description TISSUE  Final   Special Requests NONE  Final   Gram Stain NO WBC SEEN NO ORGANISMS SEEN   Final   Culture   Final    No growth aerobically or anaerobically. Performed at [facility], [address] [place], [place] [address]    Report Status 03/04/2023 FINAL  Final    Time coordinating discharge: 45 minutes  Signed: Raiden Yearwood  [facility] 03/06/2023, 2:18 PM

## 2023-03-06 NOTE — TOC Progression Note (Signed)
Transition of Care [facility]) - Progression Note    Patient Details  Name: SHAQUEL CHAVOUS MRN: [id number] Date of Birth: 1965-11-14  [facility] CM/SW Contact  Sosaia Pittinger A [place], [place] Phone Number: 03/06/2023, 9:57 AM  Clinical Narrative:    CSW provided pt with updated pt bed offers for SNF. He stated he wanted to talk with his sister regarding [facility] because she used to work there. He stated "he had a buddy" that could provide transportation to whatever facility pt decides. CSW will follow up with pt about SNF this afternoon. DC pending SNF.   TOC will continue to follow.    Expected Discharge Plan: Skilled Nursing Facility Barriers to Discharge: Continued Medical Work up  Expected Discharge Plan and Services In-house Referral: Clinical Social Work Discharge Planning Services: Other - See comment (TOC for coordination of care needs.) Post Acute Care Choice: Skilled Nursing Facility Living arrangements for the past 2 months: Single Family Home                                       Social Determinants of Health (SDOH) Interventions SDOH Screenings   Food Insecurity: Food Insecurity Present (02/24/2023)  Housing: Low Risk  (02/24/2023)  Transportation Needs: No Transportation Needs (02/24/2023)  Utilities: Not At Risk (02/24/2023)  Tobacco Use: High Risk (03/02/2023)    Readmission Risk Interventions     No data to display

## 2023-03-06 NOTE — Progress Notes (Signed)
Per Dr. Lajoyce Corners ok for RN to remove [facility] and place dry 4x4 dressing w/ ace wrap to right stump. Patient educated and aware at this time.

## 2023-03-06 NOTE — Progress Notes (Signed)
Per patient he is refusing to leave today due to not knowing if he is able to have a private room at SNF, patient is also stating his ride isn't able to take him today and is refusing an ambulance. SW and MD updated,RN awaiting response.

## 2023-03-06 NOTE — TOC Progression Note (Signed)
Transition of Care [facility]) - Progression Note    Patient Details  Name: Ryan [name] MRN: [id number] Date of Birth: [birth year]  Transition of [facility]) CM/SW Contact  Janae Bridgeman, RN Phone Number: 03/06/2023, 2:10 PM  Clinical Narrative:    CM spoke with the patient at the bedside at request of K[place], LCSW.  The patient states that he needs a size Medium limb protector and the one he received is a large.  Dr. Audrie Lia note was re-discussed with the patient regarding his follow up as Outpatient for a new limb protector but patient was insistent that he needed a medium or small and not a large size that was present in the hospital room at this time.  I called [facility] and asked that they speak with the patient at the bedside and notify [facility] if appropriate.  Milon Dikes will follow up with the patient today since patient is pending SNF placement.   Expected Discharge Plan: Skilled Nursing Facility Barriers to Discharge: Continued Medical Work up  Expected Discharge Plan and Services In-house Referral: Clinical Social Work Discharge Planning Services: Other - See comment (TOC for coordination of care needs.) Post Acute Care Choice: Skilled Nursing Facility Living arrangements for the past 2 months: Single Family Home                                       Social Determinants of Health (SDOH) Interventions SDOH Screenings   Food Insecurity: Food Insecurity Present (02/24/2023)  Housing: Low Risk  (02/24/2023)  Transportation Needs: No Transportation Needs (02/24/2023)  Utilities: Not At Risk (02/24/2023)  Tobacco Use: High Risk (03/02/2023)    Readmission Risk Interventions     No data to display

## 2023-03-07 NOTE — Discharge Summary (Signed)
Physician Discharge Summary   Patient: Ryan [name] MRN: [id number] DOB: May 21, 1966  Admit date:     02/24/2023  Discharge date: 03/07/23  Discharge Physician: Lynden Oxford  PCP: Hortencia Conradi, NP  Recommendations at discharge: Follow up with Dr Lajoyce Corners and General surgery as recommended.   Doxycycline 100 mg twice daily for 2 weeks with probiotics Follow-up with orthopedics as an outpatient Follow-up with your prosthetist as an outpatient. Encourage compliance to carb restricted diet and insulin   Contact information for follow-up providers     Nadara Mustard, MD Follow up in 1 week(s).   Specialty: Orthopedic Surgery Contact information: [address] [place] [address] [phone]         Reine Just, PA-C Follow up.   Specialty: General Surgery Why: our office is scheduling you for a wound check of your back and butock in about 3 weeks. please call to confirm appointment  date/time. Contact information: [address] [place] [address] [phone]         Hortencia Conradi, NP Follow up.   Contact information: [address] [place] [address] [phone]              Contact information for after-discharge care     Destination     [facility] .   Service: Skilled Nursing Contact information: [address] [place] [address] [phone]                   Discharge Diagnoses: Principal Problem:   Uncontrolled type 2 diabetes mellitus with hyperglycemia, without long-term current use of insulin (HCC) Active Problems:   Left buttock abscess   Open wound of right shoulder   S/P BKA (below knee amputation) unilateral, right (HCC)   Tobacco use disorder   Chronic prescription opiate use - sees pain clinic in [place], [place].   Subacute osteomyelitis of right tibia (HCC)   Dehiscence of amputation stump of right lower extremity [facility])  Hospital  Course: Ryan [name] is a 57 y.o. male with PMH significant for uncontrolled DM2, right leg osteomyelitis s/p right BKA 2022, chronic pain syndrome on chronic opiates, chronic smoker 4/16, patient presented to the ED with complaint of multiple wounds on his right shoulder, right leg, left buttocks Patient seems to be noncompliant to follow ups and medications except for his pain medicines. Few weeks ago, patient noticed a boil on his right shoulder which got worse.  Few days ago, he noticed a similar boil on the right buttock.  He also noticed a sore spot on his right BKA stump that is not draining.   In the ED, afebrile, hemodynamically stable Initial labs with WBC count 11.9, hemoglobin 11.4, lactic acid 3, sodium 127, glucose elevated at 648 CRP 11.2, ESR elevated to 113 Urine ketones negative Right femur x-ray showed below-knee amputation.  No acute osseous lesions. Admitted to [facility] General surgery and orthopedics were consulted. 4/19, he had a combined procedure, he underwent I&D and sharp debridement of right posterior shoulder and left buttock abscess followed by revision of right BKA. See below for details.  Assessment and Plan  Left buttock abscess Right upper back/shoulder wound IND of both wounds by general surgery on 4/19 Wet-to-dry dressing change per general surgery. OR cultures growing MRSA. Was on IV Rocephin and IV vancomycin.  ID consult obtained.   Recommended doxycycline twice daily for 2 weeks. Per general surgery, wound dressing twice a  day to continue.   Outpatient follow-up in 3 weeks   Right BKA stump cellulitis/drainage 4/19, revision of right BKA by Dr. Lajoyce Corners.   Tissue culture negative bone culture grew staph epi and MSSA.   Wound VAC was placed.  Wound VAC removed per Dr. Audrie Lia recommendation. Recommended against kneeling scooter until the skin is healed.  He complains about limb protector not fitting properly.  He discussed with his prosthetist at  [facility] for a new ambushield.  He needs to follow-up as an outpatient for that.   Type 2 diabetes mellitus uncontrolled with hyperglycemia A1c uncontrolled, more than 15 Not taking any medicines at home Currently on Lantus to 35 units, scheduled Premeal aspart 15 units 3 times daily along with sliding scale insulin. Continue at SNF Not following dietary restriction. Sugar level running elevated over 200 consistently.   Diabetes care coordinator consult appreciated.   Chronic pain syndrome  Chronic opiate use  sees pain clinic in [place], [place]. PTA on Neurontin and oxycodone 15 mg q3hrs Continue same.   Tobacco use disorder Continues to smoke 1 pack a day.  Wants to smoke while in the hospital.  Not happy when it was not allowed.  Nicotine patch offered. refused   Left lower extremity swelling Patient with pretty significant left lower extremity edema. US duplex scan negative for DVT.  Echocardiogram with normal LVEF, RV function and size.  Gently diuresed with IV Lasix.  It was held after creatinine climbed up.  Encouraged to keep legs elevated.   Impaired mobility PT eval obtained.  SNF recommended   Pain control - [facility] Controlled Substance Reporting System database was reviewed. and patient was instructed, not to drive, operate heavy machinery, perform activities at heights, swimming or participation in water activities or provide baby-sitting services while on Pain, Sleep and Anxiety Medications; until their outpatient Physician has advised to do so again. Also recommended to not to take more than prescribed Pain, Sleep and Anxiety Medications.  Consultants:  ID Orthopedics  General surgery   Procedures performed:   REVISION RIGHT BELOW KNEE AMPUTATION Application of Kerecis micro graft 38 cm and Kerecis sheet 7 x 10 cm. Application of customizable wound VAC. Application of vancomycin powder 1 g packed within the distal tibia. Tissue and bone sent for separate  cultures.  Incision, drainage, sharp debridement right posterior shoulder and left buttock abscess   DISCHARGE MEDICATION: Allergies as of 03/07/2023       Reactions   Acetaminophen Nausea And Vomiting        Medication List     TAKE these medications    acetaminophen 325 MG tablet Commonly known as: TYLENOL Take 1-2 tablets (325-650 mg total) by mouth every 6 (six) hours as needed for mild pain (pain score 1-3 or temp > 100.5).   bisacodyl 5 MG EC tablet Commonly known as: DULCOLAX Take 1 tablet (5 mg total) by mouth daily as needed for moderate constipation.   docusate sodium 100 MG capsule Commonly known as: COLACE Take 1 capsule (100 mg total) by mouth daily.   doxycycline 100 MG tablet Commonly known as: VIBRA-TABS Take 1 tablet (100 mg total) by mouth every 12 (twelve) hours for 14 days.   gabapentin 800 MG tablet Commonly known as: NEURONTIN Take 800 mg by mouth 3 (three) times daily.   guaiFENesin-dextromethorphan 100-10 MG/5ML syrup Commonly known as: ROBITUSSIN DM Take 15 mLs by mouth every 4 (four) hours as needed for cough.   insulin aspart 100 UNIT/ML injection Commonly  known as: novoLOG Inject 0-15 Units into the skin 3 (three) times daily with meals.   insulin aspart 100 UNIT/ML injection Commonly known as: novoLOG Inject 0-5 Units into the skin at bedtime.   insulin aspart 100 UNIT/ML injection Commonly known as: novoLOG Inject 15 Units into the skin 3 (three) times daily with meals.   insulin glargine-yfgn 100 UNIT/ML injection Commonly known as: SEMGLEE Inject 0.35 mLs (35 Units total) into the skin at bedtime.   leptospermum manuka honey Pste paste Apply 1 Application topically daily.   leptospermum manuka honey Pste paste Apply 1 Application topically daily.   naloxone 4 MG/0.1ML Liqd nasal spray kit Commonly known as: NARCAN Place 1 spray into the nose once.   nutrition supplement (JUVEN) Pack Take 1 packet by mouth 2 (two)  times daily between meals.   ondansetron 4 MG tablet Commonly known as: ZOFRAN Take 1 tablet (4 mg total) by mouth every 6 (six) hours as needed for nausea.   oxyCODONE 15 MG immediate release tablet Commonly known as: ROXICODONE Take 1 tablet (15 mg total) by mouth every 4 (four) hours as needed for up to 3 days for moderate pain or severe pain. What changed:  when to take this reasons to take this additional instructions   pantoprazole 40 MG tablet Commonly known as: PROTONIX Take 1 tablet (40 mg total) by mouth daily.   polyethylene glycol 17 g packet Commonly known as: MIRALAX / GLYCOLAX Take 17 g by mouth daily as needed for mild constipation.   Zinc Oxide 12.8 % ointment Commonly known as: TRIPLE PASTE Apply topically 2 (two) times daily.   zinc sulfate 220 (50 Zn) MG capsule Take 1 capsule (220 mg total) by mouth daily.               Discharge Care Instructions  (From admission, onward)           Start     Ordered   03/06/23 0000  Discharge wound care:        03/06/23 1418           Disposition: SNF Diet recommendation: Carb modified diet  Discharge Exam: Vitals:   03/06/23 0719 03/06/23 1945 03/07/23 0431 03/07/23 0759  BP: 126/81 135/80 134/72 139/85  Pulse: 89 91 98 99  Resp: 16 16 16 16   Temp: 98 F (36.7 C) 97.9 F (36.6 C) 98.2 F (36.8 C) 98.1 F (36.7 C)  TempSrc: Oral Oral Oral   SpO2: 99% 94% 97% 94%  Weight:      Height:       General: Appear in mild distress; no visible Abnormal Neck Mass Or lumps, Conjunctiva normal Cardiovascular: S1 and S2 Present, no Murmur, Respiratory: good respiratory effort, Bilateral Air entry present and CTA, no Crackles, no wheezes Abdomen: Bowel Sound present, Non tender  Extremities: left Pedal edema, right BKA Neurology: alert and oriented to time, place, and person  Filed Weights   03/03/23 0445 03/05/23 0500 03/06/23 0500  Weight: 97.4 kg 98.1 kg 98.4 kg   Condition at discharge:  stable  The results of significant diagnostics from this hospitalization (including imaging, microbiology, ancillary and laboratory) are listed below for reference.   Imaging Studies: VAS [place] LOWER EXTREMITY VENOUS (DVT)  Result Date: 03/03/2023  Lower Venous DVT Study Patient Name:  JADIER ROCKERS  Date of Exam:   03/03/2023 Medical Rec #: [id number]         Accession #:    [id number] Date of Birth: 01-04-1966  Patient Gender: M Patient Age:   63 years Exam [facility]  [facility] Procedure:      VAS [place] LOWER EXTREMITY VENOUS (DVT) Referring Phys: Lorin Glass --------------------------------------------------------------------------------  Indications: Swelling, and Edema. Other Indications: Status post right BKA 02/27/23. Limitations: Poor ultrasound/tissue interface and in left calf. Comparison Study: No priors. Performing Technologist: Marilynne Halsted RDMS, RVT  Examination Guidelines: A complete evaluation includes B-mode imaging, spectral Doppler, color Doppler, and power Doppler as needed of all accessible portions of each vessel. Bilateral testing is considered an integral part of a complete examination. Limited examinations for reoccurring indications may be performed as noted. The reflux portion of the exam is performed with the patient in reverse Trendelenburg.  +-----+---------------+---------+-----------+----------+--------------+ RIGHTCompressibilityPhasicitySpontaneityPropertiesThrombus Aging +-----+---------------+---------+-----------+----------+--------------+ CFV  Full           Yes      Yes                                 +-----+---------------+---------+-----------+----------+--------------+ SFJ  Full                                                        +-----+---------------+---------+-----------+----------+--------------+   +---------+---------------+---------+-----------+----------+-------------------+ LEFT      CompressibilityPhasicitySpontaneityPropertiesThrombus Aging      +---------+---------------+---------+-----------+----------+-------------------+ CFV      Full           Yes      Yes                                      +---------+---------------+---------+-----------+----------+-------------------+ SFJ      Full                                                             +---------+---------------+---------+-----------+----------+-------------------+ FV Prox  Full                                                             +---------+---------------+---------+-----------+----------+-------------------+ FV Mid   Full                                                             +---------+---------------+---------+-----------+----------+-------------------+ FV DistalFull                                                             +---------+---------------+---------+-----------+----------+-------------------+ PFV      Full                                                             +---------+---------------+---------+-----------+----------+-------------------+  POP      Full                                                             +---------+---------------+---------+-----------+----------+-------------------+ PTV      Full                                         appears patent with                                                       color               +---------+---------------+---------+-----------+----------+-------------------+ PERO     Full                                         appears patent with                                                       color               +---------+---------------+---------+-----------+----------+-------------------+     Summary: RIGHT: - No evidence of common femoral vein obstruction.  LEFT: - There is no evidence of deep vein thrombosis in the lower extremity.  - No cystic structure found  in the popliteal fossa.  *See table(s) above for measurements and observations. Electronically signed by Coral Else MD on 03/03/2023 at 7:25:48 PM.    Final    ECHOCARDIOGRAM COMPLETE  Result Date: 03/03/2023    ECHOCARDIOGRAM REPORT   Patient Name:   ORVIN NETTER Date of Exam: 03/03/2023 Medical Rec #:  [id number]        Height:       73.0 in Accession #:    [id number]       Weight:       214.7 lb Date of Birth:  1966-06-21        BSA:          2.217 m Patient Age:    56 years         BP:           124/76 mmHg Patient Gender: M                HR:           92 bpm. Exam Location:  Inpatient Procedure: 2D Echo, Pediatric Echo and Color Doppler Indications:    Abnormal ECG 794.31/R94.31  History:        Patient has no prior history of Echocardiogram examinations.                 Risk Factors:Diabetes, Current Smoker and Sleep Apnea.  Sonographer:    Lucendia Herrlich Referring Phys: [id number] BINAYA DAHAL IMPRESSIONS  1. Left ventricular ejection fraction,  by estimation, is 65 to 70%. The left ventricle has hyperdynamic function. Left ventricular endocardial border not optimally defined to evaluate regional wall motion. Left ventricular diastolic parameters were normal.  2. Right ventricular systolic function is normal. The right ventricular size is normal.  3. The mitral valve is normal in structure. No evidence of mitral valve regurgitation. No evidence of mitral stenosis.  4. The aortic valve is tricuspid. There is mild thickening of the aortic valve. Aortic valve regurgitation is not visualized. Aortic valve sclerosis is present, with no evidence of aortic valve stenosis.  5. The inferior vena cava is normal in size with greater than 50% respiratory variability, suggesting right atrial pressure of 3 mmHg. FINDINGS  Left Ventricle: Left ventricular ejection fraction, by estimation, is 65 to 70%. The left ventricle has hyperdynamic function. Left ventricular endocardial border not optimally defined to evaluate  regional wall motion. The left ventricular internal cavity size was normal in size. There is no left ventricular hypertrophy. Left ventricular diastolic parameters were normal. Right Ventricle: The right ventricular size is normal. No increase in right ventricular wall thickness. Right ventricular systolic function is normal. Left Atrium: Left atrial size was normal in size. Right Atrium: Right atrial size was normal in size. Pericardium: There is no evidence of pericardial effusion. Mitral Valve: The mitral valve is normal in structure. No evidence of mitral valve regurgitation. No evidence of mitral valve stenosis. Tricuspid Valve: The tricuspid valve is normal in structure. Tricuspid valve regurgitation is trivial. No evidence of tricuspid stenosis. Aortic Valve: The aortic valve is tricuspid. There is mild thickening of the aortic valve. Aortic valve regurgitation is not visualized. Aortic valve sclerosis is present, with no evidence of aortic valve stenosis. Aortic valve peak gradient measures 7.5  mmHg. Pulmonic Valve: The pulmonic valve was normal in structure. Pulmonic valve regurgitation is not visualized. No evidence of pulmonic stenosis. Aorta: The aortic root is normal in size and structure. Venous: The inferior vena cava is normal in size with greater than 50% respiratory variability, suggesting right atrial pressure of 3 mmHg. IAS/Shunts: No atrial level shunt detected by color flow Doppler.  LEFT VENTRICLE PLAX 2D LVIDd:         4.30 cm   Diastology LVIDs:         3.20 cm   LV e' medial:    10.70 cm/s LV PW:         1.00 cm   LV E/e' medial:  10.7 LV IVS:        1.00 cm   LV e' lateral:   12.00 cm/s LVOT diam:     2.20 cm   LV E/e' lateral: 9.5 LV SV:         73 LV SV Index:   33 LVOT Area:     3.80 cm  RIGHT VENTRICLE             IVC RV S prime:     13.70 cm/s  IVC diam: 2.10 cm TAPSE (M-mode): 2.3 cm LEFT ATRIUM             Index        RIGHT ATRIUM           Index LA diam:        3.70 cm 1.67  cm/m   RA Area:     12.40 cm LA Vol (A2C):   40.1 ml 18.08 ml/m  RA Volume:   24.00 ml  10.82 ml/m LA Vol (A4C):  49.7 ml 22.41 ml/m LA Biplane Vol: 46.9 ml 21.15 ml/m  AORTIC VALVE AV Area (Vmax): 3.11 cm AV Vmax:        137.00 cm/s AV Peak Grad:   7.5 mmHg LVOT Vmax:      112.00 cm/s LVOT Vmean:     70.767 cm/s LVOT VTI:       0.191 m  AORTA Ao Root diam: 3.70 cm Ao Asc diam:  3.30 cm MITRAL VALVE                TRICUSPID VALVE MV Area (PHT): 4.36 cm     TR Peak grad:   19.5 mmHg MV Decel Time: 174 msec     TR Vmax:        221.00 cm/s MV E velocity: 114.00 cm/s MV A velocity: 104.00 cm/s  SHUNTS MV E/A ratio:  1.10         Systemic VTI:  0.19 m                             Systemic Diam: 2.20 cm Rachelle Hora Croitoru MD Electronically signed by Thurmon Fair MD Signature Date/Time: 03/03/2023/1:00:37 PM    Final    CT KNEE RIGHT W CONTRAST  Result Date: 02/26/2023 CLINICAL DATA:  Below the knee amputation with amputation site wound. Evaluate for underlying abscess or osteomyelitis. EXAM: CT OF THE RIGHT KNEE WITH CONTRAST TECHNIQUE: Multidetector CT imaging was performed following the standard protocol during bolus administration of intravenous contrast. RADIATION DOSE REDUCTION: This exam was performed according to the departmental dose-optimization program which includes automated exposure control, adjustment of the mA and/or kV according to patient size and/or use of iterative reconstruction technique. CONTRAST:  75mL OMNIPAQUE IOHEXOL 350 MG/ML SOLN COMPARISON:  Radiographs 02/24/2023. FINDINGS: Bones/Joint/Cartilage Status post below the knee amputation. Along the lateral aspect of the tibial stump, there is an ill-defined osseous erosion measuring approximately 9 mm on coronal image 110/6. The amputation margins of the distal tibia and fibula are otherwise smooth without additional areas of bone destruction. The bones are demineralized. The distal femur and patella are unremarkable in appearance. There  is a small nonspecific knee joint effusion. Ligaments Suboptimally assessed by CT. The cruciate ligaments are grossly intact. Muscles and Tendons The quadriceps and patellar tendons are intact. Mild atrophy within the lower leg musculature. No focal intramuscular fluid collections or suspicious enhancement identified. Soft tissues There are several ill-defined low-density collections within the soft tissues along the anterolateral aspect of the distal tibial stump. There is a distal component measuring up to 2.3 cm on coronal image 103/8. Anterolaterally, there is a component measuring up to 2.5 cm on image 115/5. There is mild peripheral enhancement of these collections which are not well defined. No associated foreign body or soft tissue emphysema. No proximal fluid collections are identified. There is generalized subcutaneous edema throughout the remaining lower leg and visualized distal thigh. Femoral atherosclerosis noted. IMPRESSION: 1. Ill-defined low-density collections within the soft tissues along the anterolateral aspect of the distal tibial stump, suspicious for small abscesses. 2. Small ill-defined osseous erosion along the lateral aspect of the tibial stump suspicious for osteomyelitis. No other suspicious osseous findings post below the knee amputation. 3. Generalized subcutaneous edema throughout the remaining lower leg and visualized distal thigh. 4. Nonspecific small knee joint effusion. Electronically Signed   By: Carey Bullocks M.D.   On: 02/26/2023 08:02   CT Chest W Contrast  Result Date: 02/24/2023  CLINICAL DATA:  Chest wall pain EXAM: CT CHEST WITH CONTRAST TECHNIQUE: Multidetector CT imaging of the chest was performed during intravenous contrast administration. RADIATION DOSE REDUCTION: This exam was performed according to the departmental dose-optimization program which includes automated exposure control, adjustment of the mA and/or kV according to patient size and/or use of iterative  reconstruction technique. CONTRAST:  50mL OMNIPAQUE IOHEXOL 350 MG/ML SOLN COMPARISON:  CT abdomen 05/03/2009 and previous FINDINGS: Cardiovascular: Heart size upper limits normal. Chronic anterior pericardial calcification without effusion. Scattered left coronary calcifications. Atheromatous descending thoracic aorta without aneurysm. Mediastinum/Nodes: No mass or adenopathy. Lungs/Pleura: No pleural effusion. No pneumothorax. Minimal dependent atelectasis at the left lung base. Lungs otherwise clear. Upper Abdomen: No acute findings. Stable left upper pole renal cyst; no follow-up recommended. Musculoskeletal: Cervical fixation hardware partially visualized. Bilateral sternoclavicular DJD. Sternotomy wires. Vertebral endplate spurring at multiple levels in the lower thoracic spine. IMPRESSION: 1. No acute findings. 2. Coronary and Aortic Atherosclerosis (ICD10-170.0). Electronically Signed   By: Corlis Leak M.D.   On: 02/24/2023 20:37   DG Chest 1 View  Result Date: 02/24/2023 CLINICAL DATA:  Right leg stump pain EXAM: CHEST  1 VIEW COMPARISON:  07/09/2022 FINDINGS: Lungs are essentially clear. Left infrahilar opacity, likely atelectasis. No pleural effusion or pneumothorax. The heart is normal in size. Median sternotomy. IMPRESSION: No acute cardiopulmonary disease. Electronically Signed   By: Charline Bills M.D.   On: 02/24/2023 20:21   DG Knee 2 Views Right  Result Date: 02/24/2023 CLINICAL DATA:  Stump pain of right leg below-knee EXAM: RIGHT KNEE - 1-2 VIEW; RIGHT FEMUR 2 VIEWS; PELVIS - 1-2 VIEW COMPARISON:  None Available. FINDINGS: No acute fracture or dislocation in the pelvis. Degenerative changes pubic symphysis, both hips, SI joints and lower lumbar spine. No acute fracture of the right femur. Right below-the-knee amputation. No acute fracture or dislocation of the right knee. No effusion. No evidence of osteomyelitis about the stump. Soft tissues are unremarkable. Vascular calcifications.  The lateral view is limited due to external material projecting over the knee. IMPRESSION: 1. No acute fracture or dislocation in the pelvis, right femur, or knee. 2. Right below-the-knee amputation.  No acute osseous abnormality. Electronically Signed   By: Minerva Fester M.D.   On: 02/24/2023 20:20   DG Femur Min 2 Views Right  Result Date: 02/24/2023 CLINICAL DATA:  Stump pain of right leg below-knee EXAM: RIGHT KNEE - 1-2 VIEW; RIGHT FEMUR 2 VIEWS; PELVIS - 1-2 VIEW COMPARISON:  None Available. FINDINGS: No acute fracture or dislocation in the pelvis. Degenerative changes pubic symphysis, both hips, SI joints and lower lumbar spine. No acute fracture of the right femur. Right below-the-knee amputation. No acute fracture or dislocation of the right knee. No effusion. No evidence of osteomyelitis about the stump. Soft tissues are unremarkable. Vascular calcifications. The lateral view is limited due to external material projecting over the knee. IMPRESSION: 1. No acute fracture or dislocation in the pelvis, right femur, or knee. 2. Right below-the-knee amputation.  No acute osseous abnormality. Electronically Signed   By: Minerva Fester M.D.   On: 02/24/2023 20:20   DG Pelvis 1-2 Views  Result Date: 02/24/2023 CLINICAL DATA:  Stump pain of right leg below-knee EXAM: RIGHT KNEE - 1-2 VIEW; RIGHT FEMUR 2 VIEWS; PELVIS - 1-2 VIEW COMPARISON:  None Available. FINDINGS: No acute fracture or dislocation in the pelvis. Degenerative changes pubic symphysis, both hips, SI joints and lower lumbar spine. No acute fracture of the right femur.  Right below-the-knee amputation. No acute fracture or dislocation of the right knee. No effusion. No evidence of osteomyelitis about the stump. Soft tissues are unremarkable. Vascular calcifications. The lateral view is limited due to external material projecting over the knee. IMPRESSION: 1. No acute fracture or dislocation in the pelvis, right femur, or knee. 2. Right  below-the-knee amputation.  No acute osseous abnormality. Electronically Signed   By: Minerva Fester M.D.   On: 02/24/2023 20:20    Microbiology: Results for orders placed or performed during the hospital encounter of 02/24/23  Resp panel by RT-PCR (RSV, Flu A&B, Covid) Anterior Nasal Swab     Status: None   Collection Time: 02/24/23  7:20 PM   Specimen: Anterior Nasal Swab  Result Value Ref Range Status   SARS Coronavirus 2 by RT PCR NEGATIVE NEGATIVE Final   Influenza A by PCR NEGATIVE NEGATIVE Final   Influenza B by PCR NEGATIVE NEGATIVE Final    Comment: (NOTE) The Xpert Xpress SARS-CoV-2/FLU/RSV plus assay is intended as an aid in the diagnosis of influenza from Nasopharyngeal swab specimens and should not be used as a sole basis for treatment. Nasal washings and aspirates are unacceptable for Xpert Xpress SARS-CoV-2/FLU/RSV testing.  Fact Sheet for Patients: BloggerCourse.com  Fact Sheet for Healthcare Providers: SeriousBroker.it  This test is not yet approved or cleared by the [place] FDA and has been authorized for detection and/or diagnosis of SARS-CoV-2 by FDA under an Emergency Use Authorization (EUA). This EUA will remain in effect (meaning this test can be used) for the duration of the COVID-19 declaration under Section 564(b)(1) of the Act, 21 U.S.C. section 360bbb-3(b)(1), unless the authorization is terminated or revoked.     Resp Syncytial Virus by PCR NEGATIVE NEGATIVE Final    Comment: (NOTE) Fact Sheet for Patients: BloggerCourse.com  Fact Sheet for Healthcare Providers: SeriousBroker.it  This test is not yet approved or cleared by the [place] FDA and has been authorized for detection and/or diagnosis of SARS-CoV-2 by FDA under an Emergency Use Authorization (EUA). This EUA will remain in effect (meaning this test can be used) for the duration  of the COVID-19 declaration under Section 564(b)(1) of the Act, 21 U.S.C. section 360bbb-3(b)(1), unless the authorization is terminated or revoked.  Performed at [facility], [address], [place], [place] [address]   Blood Culture (routine x 2)     Status: None   Collection Time: 02/24/23  7:32 PM   Specimen: BLOOD  Result Value Ref Range Status   Specimen Description BLOOD LEFT ANTECUBITAL  Final   Special Requests   Final    BOTTLES DRAWN AEROBIC AND ANAEROBIC Blood Culture adequate volume   Culture   Final    NO GROWTH 5 DAYS Performed at [facility], [address] [place], [place] [address]    Report Status 03/01/2023 FINAL  Final  Blood Culture (routine x 2)     Status: None   Collection Time: 02/24/23  7:46 PM   Specimen: BLOOD LEFT FOREARM  Result Value Ref Range Status   Specimen Description BLOOD LEFT FOREARM  Final   Special Requests   Final    BOTTLES DRAWN AEROBIC AND ANAEROBIC Blood Culture results may not be optimal due to an excessive volume of blood received in culture bottles   Culture   Final    NO GROWTH 5 DAYS Performed at [facility], [address] [place], [place] [address]    Report Status  03/01/2023 FINAL  Final  Aerobic/Anaerobic Culture w Gram Stain (surgical/deep wound)     Status: None   Collection Time: 02/27/23 11:23 AM   Specimen: Soft Tissue, Other  Result Value Ref Range Status   Specimen Description TISSUE  Final   Special Requests NONE  Final   Gram Stain NO WBC SEEN NO ORGANISMS SEEN   Final   Culture   Final    RARE STAPHYLOCOCCUS AUREUS FEW STAPHYLOCOCCUS EPIDERMIDIS NO ANAEROBES ISOLATED Performed at [facility], [address], [place], [place] [address]    Report Status 03/04/2023 FINAL  Final   Organism ID, Bacteria STAPHYLOCOCCUS AUREUS  Final   Organism ID, Bacteria STAPHYLOCOCCUS EPIDERMIDIS  Final      Susceptibility   Staphylococcus aureus - MIC*    CIPROFLOXACIN >=8 RESISTANT  Resistant     ERYTHROMYCIN >=8 RESISTANT Resistant     GENTAMICIN <=0.5 SENSITIVE Sensitive     OXACILLIN 0.5 SENSITIVE Sensitive     TETRACYCLINE <=1 SENSITIVE Sensitive     VANCOMYCIN 1 SENSITIVE Sensitive     TRIMETH/SULFA <=10 SENSITIVE Sensitive     CLINDAMYCIN >=8 RESISTANT Resistant     RIFAMPIN <=0.5 SENSITIVE Sensitive     Inducible Clindamycin NEGATIVE Sensitive     * RARE STAPHYLOCOCCUS AUREUS   Staphylococcus epidermidis - MIC*    CIPROFLOXACIN <=0.5 SENSITIVE Sensitive     ERYTHROMYCIN >=8 RESISTANT Resistant     GENTAMICIN <=0.5 SENSITIVE Sensitive     OXACILLIN >=4 RESISTANT Resistant     TETRACYCLINE 2 SENSITIVE Sensitive     VANCOMYCIN 1 SENSITIVE Sensitive     TRIMETH/SULFA >=320 RESISTANT Resistant     CLINDAMYCIN >=8 RESISTANT Resistant     RIFAMPIN <=0.5 SENSITIVE Sensitive     Inducible Clindamycin NEGATIVE Sensitive     * FEW STAPHYLOCOCCUS EPIDERMIDIS  Aerobic/Anaerobic Culture w Gram Stain (surgical/deep wound)     Status: None   Collection Time: 02/27/23 11:25 AM   Specimen: Soft Tissue, Other  Result Value Ref Range Status   Specimen Description TISSUE  Final   Special Requests NONE  Final   Gram Stain NO WBC SEEN NO ORGANISMS SEEN   Final   Culture   Final    No growth aerobically or anaerobically. Performed at [facility], [address] [place], [place] [address]    Report Status 03/04/2023 FINAL  Final   Labs: CBC: Recent Labs  Lab 03/02/23 0331 03/03/23 0532 03/04/23 0417 03/05/23 0611 03/06/23 0727  WBC 9.8 10.8* 11.0* 8.8 8.7  HGB 10.2* 9.6* 10.5* 9.7* 9.8*  HCT 30.1* 28.2* 33.2* 30.5* 29.6*  MCV 87.2 88.1 91.0 91.3 89.2  PLT 441* 422* 508* 473* 468*   Basic Metabolic Panel: Recent Labs  Lab 03/02/23 0331 03/03/23 0532 03/04/23 0417 03/05/23 0611 03/06/23 0727  NA 133* 132* 133* 134* 135  K 4.0 3.5 4.2 4.6 4.3  CL 100 100 99 98 100  CO2 25 24 25 29 26   GLUCOSE 235* 114* 266* 249* 135*  BUN 23* 26* 29* 27* 26*   CREATININE 1.06 1.09 1.34* 1.07 1.12  CALCIUM 8.7* 8.6* 8.6* 9.0 8.9   Liver Function Tests: No results for input(s): "AST", "ALT", "ALKPHOS", "BILITOT", "PROT", "ALBUMIN" in the last 168 hours. CBG: Recent Labs  Lab 02/28/23 2018 03/01/23 0816 03/01/23 2135 03/02/23 0812 03/03/23 2201  GLUCAP 338* 178* 343* 157* 368*    Discharge time spent: greater than 30 minutes.  Signed: Lynden Oxford, MD [facility]

## 2023-03-07 NOTE — TOC Transition Note (Signed)
Transition of Care [facility]) - CM/SW Discharge Note   Patient Details  Name: Ryan [name] MRN: [id number] Date of Birth: [birth year]  [facility] CM/SW Contact:  Deatra Robinson, LCSW Phone Number: 03/07/2023, 10:15 AM   Clinical Narrative: Pt for dc to [facility] today. Pt aware he will go to a semi-private room today with the plan of transferring to a private room on Monday or Tuesday. Confirmed with Lauren at [facility] they are prepared to admit pt to room 111-A. RN provided with number for report. Pt states his friend will provide transport after lunch today. SW signing off at dc.   Dellie Burns, MSW, LCSW [phone] (coverage)       Final next level of care: Skilled Nursing Facility Barriers to Discharge: No Barriers Identified   Patient Goals and CMS Choice CMS Medicare.gov Compare Post Acute Care list provided to:: Patient Choice offered to / list presented to : Patient  Discharge Placement                Patient chooses bed at: Other - please specify in the comment section below: ([facility]) Patient to be transferred to facility by: Friend Name of family member notified: Pt to update family Patient and family notified of of transfer: 03/07/23  Discharge Plan and Services Additional resources added to the After Visit Summary for   In-house Referral: Clinical Social Work Discharge Planning Services: Other - See comment (TOC for coordination of care needs.) Post Acute Care Choice: Skilled Nursing Facility                               Social Determinants of Health (SDOH) Interventions SDOH Screenings   Food Insecurity: Food Insecurity Present (02/24/2023)  Housing: Low Risk  (02/24/2023)  Transportation Needs: No Transportation Needs (02/24/2023)  Utilities: Not At Risk (02/24/2023)  Tobacco Use: High Risk (03/02/2023)     Readmission Risk Interventions     No data to display

## 2023-03-18 ENCOUNTER — Encounter: Payer: Self-pay | Admitting: Family

## 2023-03-18 ENCOUNTER — Ambulatory Visit (INDEPENDENT_AMBULATORY_CARE_PROVIDER_SITE_OTHER): Payer: 59 | Admitting: Family

## 2023-03-18 DIAGNOSIS — Z89511 Acquired absence of right leg below knee: Secondary | ICD-10-CM

## 2023-03-18 DIAGNOSIS — T8781 Dehiscence of amputation stump: Secondary | ICD-10-CM
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
## 2023-03-20 ENCOUNTER — Encounter: Payer: 59 | Admitting: Family

## 2023-03-23 ENCOUNTER — Telehealth: Payer: Self-pay | Admitting: Orthopedic Surgery

## 2023-03-23 NOTE — Telephone Encounter (Addendum)
Patient called asked if he can get a Rx refilled for an antibiotic. Patient said the facility ([facility]) took him off of the antibiotic he was on. Patient said his leg is still draining. The number to contact patient is [phone] The fax # [phone]    Ph# is [phone]  [facility] per Sheryle Hail -nurse

## 2023-03-24 ENCOUNTER — Telehealth: Payer: Self-pay | Admitting: Orthopedic Surgery

## 2023-03-24 NOTE — Telephone Encounter (Signed)
[facility] advising is wound is dehiscing and getting bigger, please call [phone]( lindsey)

## 2023-03-24 NOTE — Telephone Encounter (Signed)
Can you please call and make an appt for pt to come in tomorrow? He needs assessment if they are concerned about changes in wound. If you need a spot open let me know. Thanks

## 2023-03-24 NOTE — Telephone Encounter (Signed)
Called and sw wound nurse. Will keep the 5/22 appt

## 2023-03-26 ENCOUNTER — Ambulatory Visit (INDEPENDENT_AMBULATORY_CARE_PROVIDER_SITE_OTHER): Payer: 59 | Admitting: Orthopedic Surgery

## 2023-03-26 DIAGNOSIS — Z89511 Acquired absence of right leg below knee: Secondary | ICD-10-CM

## 2023-04-01 ENCOUNTER — Telehealth: Payer: Self-pay

## 2023-04-01 ENCOUNTER — Ambulatory Visit: Payer: 59 | Admitting: Family

## 2023-04-01 NOTE — Telephone Encounter (Signed)
Gerrianne Scale from [facility] called and stated pt is dehiscing more and pulling stitches. I talked to the pt. He will come in and be seen tomorrow at 1pm   S/p revision BKA on 02/27/23

## 2023-04-01 NOTE — Telephone Encounter (Signed)
Noted  

## 2023-04-02 ENCOUNTER — Encounter: Payer: Self-pay | Admitting: Orthopedic Surgery

## 2023-04-02 ENCOUNTER — Ambulatory Visit (INDEPENDENT_AMBULATORY_CARE_PROVIDER_SITE_OTHER): Payer: 59 | Admitting: Orthopedic Surgery

## 2023-04-02 DIAGNOSIS — T8781 Dehiscence of amputation stump: Secondary | ICD-10-CM

## 2023-04-02 DIAGNOSIS — Z89511 Acquired absence of right leg below knee: Secondary | ICD-10-CM
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
## 2023-04-06 ENCOUNTER — Encounter: Payer: Self-pay | Admitting: Orthopedic Surgery
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
## 2023-04-08 ENCOUNTER — Ambulatory Visit (HOSPITAL_BASED_OUTPATIENT_CLINIC_OR_DEPARTMENT_OTHER): Payer: 59 | Admitting: General Surgery

## 2023-04-09 ENCOUNTER — Encounter: Payer: 59 | Admitting: Orthopedic Surgery

## 2023-04-14 ENCOUNTER — Ambulatory Visit (INDEPENDENT_AMBULATORY_CARE_PROVIDER_SITE_OTHER): Payer: 59 | Admitting: Orthopedic Surgery

## 2023-04-14 DIAGNOSIS — T8781 Dehiscence of amputation stump: Secondary | ICD-10-CM

## 2023-04-14 DIAGNOSIS — Z89511 Acquired absence of right leg below knee: Secondary | ICD-10-CM

## 2023-04-14 MED ORDER — COLLAGENASE 250 UNIT/GM EX OINT: 1.0000 | TOPICAL_OINTMENT | Freq: Every day | CUTANEOUS | 3 refills | Status: DC

## 2023-04-14 MED ORDER — DOXYCYCLINE HYCLATE 100 MG PO TABS: 100.0000 mg | ORAL_TABLET | Freq: Two times a day (BID) | ORAL | 0 refills | Status: DC

## 2023-04-15 ENCOUNTER — Other Ambulatory Visit: Payer: Self-pay

## 2023-04-15 ENCOUNTER — Telehealth: Payer: Self-pay | Admitting: Orthopedic Surgery

## 2023-04-15 NOTE — Telephone Encounter (Signed)
Order for daily soap and water cleansing santyl dressing application and Doxy BID x 2 weeks. Pt will follow up next week for eval. This was faxed as requested to facility.

## 2023-04-15 NOTE — Telephone Encounter (Signed)
Mardella Layman, RN from [facility] called regarding Mr. Stancato.  He had a visit with Dr. Lajoyce Corners yesterday and returned to the facility without any orders or his "envelope".  The patient advised Mardella Layman that he received an antibiotic and some new dressing instructions.  Can someone please fax these over to [facility] so that she will have a copy of the orders (I looked and at this time I do not see Dr. Audrie Lia dictation from yesterday's visit)?  I did advise that per the med list, he was given Rx for doxycycline and santyl dressings.  She will need these in order form faxed to [phone].  If you need to speak with Mardella Layman, her cell # is [phone].

## 2023-04-16 ENCOUNTER — Telehealth: Payer: Self-pay

## 2023-04-16 ENCOUNTER — Telehealth: Payer: Self-pay | Admitting: Orthopedic Surgery

## 2023-04-16 NOTE — Telephone Encounter (Signed)
Mary Mauldin with [facility].would like a call back concerning shin guard for patient.  Cb# [phone].  Please advise.  Thank you. 

## 2023-04-16 NOTE — Telephone Encounter (Signed)
Lerry Liner with [facility].would like a call back concerning shin guard for patient.  Cb# [phone].  Please advise.  Thank you.

## 2023-04-17 ENCOUNTER — Telehealth: Payer: Self-pay | Admitting: Orthopedic Surgery

## 2023-04-17 NOTE — Telephone Encounter (Signed)
Received vm from [facility] needing the last few notes. I faxed all postop notes [phone], callback [phone]

## 2023-04-17 NOTE — Telephone Encounter (Signed)
I called and sw the nurse and she advised that the pt was trying to use a prosthetic company and have them make an immobilizer for the leg advised this was not needed he could continue with the limb protector and dry dressing and keep f/u on 04/28/2023 can discuss with Dr. Lajoyce Corners

## 2023-04-24 ENCOUNTER — Encounter: Payer: Self-pay | Admitting: Orthopedic Surgery
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
## 2023-04-28 ENCOUNTER — Ambulatory Visit (INDEPENDENT_AMBULATORY_CARE_PROVIDER_SITE_OTHER): Payer: 59 | Admitting: Orthopedic Surgery

## 2023-04-28 DIAGNOSIS — Z89511 Acquired absence of right leg below knee: Secondary | ICD-10-CM

## 2023-04-28 DIAGNOSIS — T8781 Dehiscence of amputation stump: Secondary | ICD-10-CM

## 2023-04-30 ENCOUNTER — Encounter: Payer: Self-pay | Admitting: Orthopedic Surgery

## 2023-04-30 ENCOUNTER — Inpatient Hospital Stay (HOSPITAL_COMMUNITY): Payer: 59 | Admitting: Anesthesiology

## 2023-04-30 ENCOUNTER — Encounter (HOSPITAL_COMMUNITY): Payer: Self-pay | Admitting: Orthopedic Surgery

## 2023-04-30 ENCOUNTER — Telehealth: Payer: Self-pay
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
## 2023-04-30 NOTE — Anesthesia Preprocedure Evaluation (Addendum)
Anesthesia Evaluation  Patient identified by MRN, date of birth, ID band Patient awake    Reviewed: Allergy & Precautions, H&P , NPO status , Patient's Chart, lab work & pertinent test results  Airway Mallampati: III  TM Distance: >3 FB Neck ROM: Full    Dental  (+) Missing, Poor Dentition   Pulmonary asthma , sleep apnea and Continuous Positive Airway Pressure Ventilation , Current Smoker and Patient abstained from smoking.   Pulmonary exam normal breath sounds clear to auscultation       Cardiovascular Normal cardiovascular exam Rhythm:Regular Rate:Normal  Echo 02/2023  1. Left ventricular ejection fraction, by estimation, is 65 to 70%. The left ventricle has hyperdynamic function. Left ventricular endocardial border not optimally defined to evaluate regional wall motion. Left ventricular diastolic parameters were  normal.   2. Right ventricular systolic function is normal. The right ventricular size is normal.   3. The mitral valve is normal in structure. No evidence of mitral valve regurgitation. No evidence of mitral stenosis.   4. The aortic valve is tricuspid. There is mild thickening of the aortic valve. Aortic valve regurgitation is not visualized. Aortic valve sclerosis is present, with no evidence of aortic valve stenosis.   5. The inferior vena cava is normal in size with greater than 50% respiratory variability, suggesting right atrial pressure of 3 mmHg.      Neuro/Psych    GI/Hepatic ,GERD  ,,  Endo/Other  diabetes, Poorly Controlled, Oral Hypoglycemic Agents    Renal/GU      Musculoskeletal  (+) Arthritis , Osteoarthritis,    Abdominal  (+) + obese  Peds  Hematology   Anesthesia Other Findings   Reproductive/Obstetrics                             Anesthesia Physical Anesthesia Plan  ASA: 3  Anesthesia Plan: General   Post-op Pain Management: Dilaudid IV and Precedex    Induction: Intravenous, Rapid sequence and Cricoid pressure planned  PONV Risk Score and Plan: 2 and Ondansetron, Treatment may vary due to age or medical condition, Dexamethasone and Midazolam  Airway Management Planned: Oral ETT  Additional Equipment:   Intra-op Plan:   Post-operative Plan: Extubation in OR  Informed Consent: I have reviewed the patients History and Physical, chart, labs and discussed the procedure including the risks, benefits and alternatives for the proposed anesthesia with the patient or authorized representative who has indicated his/her understanding and acceptance.     Dental advisory given  Plan Discussed with: CRNA  Anesthesia Plan Comments: (I met the patient in preop. He was quite short when I spoke to him, and had reportedly been very rude to short stay staff already. The pt. was also drowsy and would be slow to respond to my questions. He had reportedly been given Trulicity today. I discussed the risks of aspiration and that he would need a GETA with RSI. He repeatedly stated he was in pain and would ask for pain medicine. I offered him a peripheral nerve block for his surgery and leg pain, but he refused and continued to request pain medicine. Given his already sedated state I didn't feel comfortable with giving anything prior to all preop questions being answer. The OR RN came to do her assessment. When she asked the patient what he was having done today he replied "keeping me in pain." When the RN asked multiple other times he answered with the same response. I  said to the patient that he must answer the questions to ensure his safety so that we can know he has given informed consent. He refused. I stated that if he doesn't answer the questions he'll be sent back to his facility without surgery to which he responded "send me back to my facility." I discussed this with Dr. Lajoyce Corners. Apparently he would not answer Dr. Audrie Lia questions adequately either. )         Anesthesia Quick Evaluation

## 2023-04-30 NOTE — Progress Notes (Signed)
Surgical Instructions    Your procedure is scheduled on Friday May 01, 2023.  Report to [facility] Main Entrance "A" at 1 pm., then check in with the Admitting office.  Call this number if you have problems the morning of surgery:  [phone]   If you have any questions prior to your surgery date call [phone]: Open Monday-Friday 8am-4pm If you experience any cold or flu symptoms such as cough, fever, chills, shortness of breath, etc. between now and your scheduled surgery, please notify us at the above number    Remember:  Do not eat after midnight the night before your surgery  You may drink clear liquids until 12:30 the morning of your surgery.   Clear liquids allowed are: Water, Non-Citrus Juices (without pulp), Carbonated Beverages, Clear Tea, Black Coffee ONLY (NO MILK, CREAM OR POWDERED CREAMER of any kind), and Gatorade   Take these medicines the morning of surgery with A SIP OF WATER:  gabapentin (NEURONTIN)  pantoprazole (PROTONIX)    As needed: acetaminophen (TYLENOL)  ondansetron (ZOFRAN)  oxyCODONE (ROXICODONE)   As of today, STOP taking any Aspirin (unless otherwise instructed by your surgeon) Aleve, Naproxen, Ibuprofen, Motrin, Advil, Goody's, BC's, all herbal medications, fish oil, and all vitamins.   WHAT DO I DO ABOUT MY DIABETES MEDICATION?   Do not take oral diabetes medicines (pills) the morning of surgery.        THE NIGHT BEFORE SURGERY DO NOT TAKE YOUR BEDTIME DOSE OF insulin aspart (NOVOLOG) .         THE MORNING OF SURGERY DO NOT TAKE YOUR MORNING DOSE OF  insulin aspart (NOVOLOG)   THE NIGHT BEFORE SURGERY, take 15 units of insulin glargine-yfgn (SEMGLEE).  THE MORNING OF SURGERY, take 15 units of insulin glargine-yfgn (SEMGLEE).   The day of surgery, do not take other diabetes injectables, including Byetta (exenatide), Bydureon (exenatide ER), Victoza (liraglutide), or Trulicity (dulaglutide).   If your CBG is greater than 220 mg/dL,  you may take  of your sliding scale (correction) dose of insulin.   HOW TO MANAGE YOUR DIABETES BEFORE AND AFTER SURGERY  Why is it important to control my blood sugar before and after surgery? Improving blood sugar levels before and after surgery helps healing and can limit problems. A way of improving blood sugar control is eating a healthy diet by:  Eating less sugar and carbohydrates  Increasing activity/exercise  Talking with your doctor about reaching your blood sugar goals High blood sugars (greater than 180 mg/dL) can raise your risk of infections and slow your recovery, so you will need to focus on controlling your diabetes during the weeks before surgery. Make sure that the doctor who takes care of your diabetes knows about your planned surgery including the date and location.  How do I manage my blood sugar before surgery? Check your blood sugar at least 4 times a day, starting 2 days before surgery, to make sure that the level is not too high or low.  Check your blood sugar the morning of your surgery when you wake up and every 2 hours until you get to the Short Stay unit.  If your blood sugar is less than 70 mg/dL, you will need to treat for low blood sugar: Do not take insulin. Treat a low blood sugar (less than 70 mg/dL) with  cup of clear juice (cranberry or apple), 4 glucose tablets, OR glucose gel. Recheck blood sugar in 15 minutes after treatment (to make sure it is  greater than 70 mg/dL). If your blood sugar is not greater than 70 mg/dL on recheck, call [phone] for further instructions. Report your blood sugar to the short stay nurse when you get to Short Stay.  If you are admitted to the hospital after surgery: Your blood sugar will be checked by the staff and you will probably be given insulin after surgery (instead of oral diabetes medicines) to make sure you have good blood sugar levels. The goal for blood sugar control after surgery is 80-180 mg/dL.      Do NOT Smoke (Tobacco/Vaping)  24 hours prior to your procedure  If you use a CPAP at night, you may bring your mask for your overnight stay.   Contacts, glasses, hearing aids, dentures or partials may not be worn into surgery, please bring cases for these belongings   For patients admitted to the hospital, discharge time will be determined by your treatment team.   Patients discharged the day of surgery will not be allowed to drive home, and someone needs to stay with them for 24 hours.   SURGICAL WAITING ROOM VISITATION Patients having surgery or a procedure may have no more than 2 support people in the waiting area - these visitors may rotate.   Children under the age of 62 must have an adult with them who is not the patient. If the patient needs to stay at the hospital during part of their recovery, the visitor guidelines for inpatient rooms apply. Pre-op nurse will coordinate an appropriate time for 1 support person to accompany patient in pre-op.  This support person may not rotate.   Please refer to [email or web address] for the visitor guidelines for Inpatients (after your surgery is over and you are in a regular room).    Special instructions:    Oral Hygiene is also important to reduce your risk of infection.  Remember - BRUSH YOUR TEETH THE MORNING OF SURGERY WITH YOUR REGULAR TOOTHPASTE   [facility]- Preparing For Surgery  Please follow these instructions carefully.   Shower the [employer] BEFORE SURGERY  with [employer].   Pat yourself dry with a CLEAN TOWEL.  Wear CLEAN PAJAMAS to bed the night before surgery  Place CLEAN SHEETS on your bed the night before your surgery   Day of Surgery: Take a shower with Dial soap. Do not wear jewelry or makeup. Do not wear lotions, powders,cologne or deodorant.  Men may shave face and neck. Do not bring valuables to the hospital.  [facility] is not responsible for any  belongings or valuables.   Wear Clean/Comfortable clothing the morning of surgery   Remember to brush your teeth WITH YOUR REGULAR TOOTHPASTE.   If you received a COVID test during your pre-op visit, it is requested that you wear a mask when out in public, stay away from anyone that may not be feeling well, and notify your surgeon if you develop symptoms. If you have been in contact with anyone that has tested positive in the last 10 days, please notify your surgeon.    Please read over the following fact sheets that you were given.

## 2023-04-30 NOTE — Telephone Encounter (Signed)
Called pt and lm o nvm to advise that surgery scheduling will be calling to advise of arrive time.

## 2023-04-30 NOTE — Telephone Encounter (Signed)
Patient called the triage line and stated he was advised by Dr Lajoyce Corners he would be scheduled for surgery, for tomorrow.  He said that he has not been given a time, just yet to be there for the surgery and would like a call back.   [phone]  He also would like to talk with Dr Lajoyce Corners about the surgery-stated he has some more questions, as he is not quite convinced yet that he should have surgery.

## 2023-04-30 NOTE — Progress Notes (Signed)
SDW call  Spoke with Toni Amend, RN at [facility] at [phone].  Reviewed pre-surgical instructions and faxed instructions to [phone]   PCP - [name], NP Cardiologist -  Pulmonary:    PPM/ICD - Denies  Chest x-ray - 02/24/2023 EKG -  02/24/2023 Stress Test -03/03/2023 ECHO -  Cardiac Cath -   Sleep Study/sleep apnea/CPAP: Diagnosed with sleep apnea and wears a CPAP night  Type II diabetic Fasting Blood sugar range: 109-150 How often check sugars: AC/HS Insulin aspart (Novolog) instructed to hold bedtime and morning dose.  If blood sugar>220 give 1/2 correction dose. Insulin glargine (Semglee), instructed to give 15 units (50% regular dose) at bedtime the night before surgery.    Blood Thinner Instructions: denies Aspirin Instructions:denies   ERAS Protcol - Yes, clear fluids until 1230 PRE-SURGERY Ensure or G2-    COVID TEST- n/a    Anesthesia review: No   Patient denies shortness of breath, fever, cough and chest pain over the phone call  Your procedure is scheduled on Friday May 01, 2023  Report to [facility] Main Entrance "A" at 1300, then check in with the Admitting office.  Call this number if you have problems the morning of surgery:  [phone]   If you have any questions prior to your surgery date call [phone]: Open Monday-Friday 8am-4pm If you experience any cold or flu symptoms such as cough, fever, chills, shortness of breath, etc. between now and your scheduled surgery, please notify us at the above number    Remember:  Do not eat after midnight the night before your surgery  You may drink clear liquids until 1230  the morning of your surgery.   Clear liquids allowed are: Water, Non-Citrus Juices (without pulp), Carbonated Beverages, Clear Tea, Black Coffee ONLY (NO MILK, CREAM OR POWDERED CREAMER of any kind), and Gatorade   Take these medicines the morning of surgery with A SIP OF WATER:  Gabapentin, protonix  As  needed: Tylenol, guaifenesin, zofran, oxycodone  As of today, STOP taking any Aspirin (unless otherwise instructed by your surgeon) Aleve, Naproxen, Ibuprofen, Motrin, Advil, Goody's, BC's, all herbal medications, fish oil, and all vitamins.

## 2023-05-01 ENCOUNTER — Encounter (HOSPITAL_COMMUNITY)
Admission: RE | Disposition: A | Discharge status: Skilled Nursing Facility | Payer: Self-pay | Source: Home / Self Care | Attending: Orthopedic Surgery

## 2023-05-01 ENCOUNTER — Other Ambulatory Visit: Payer: Self-pay

## 2023-05-01 ENCOUNTER — Ambulatory Visit (HOSPITAL_COMMUNITY)
Admission: RE | Admit: 2023-05-01 | Discharge: 2023-05-01 | Disposition: A | Discharge status: Skilled Nursing Facility | Payer: 59 | Attending: Orthopedic Surgery | Admitting: Orthopedic Surgery

## 2023-05-01 ENCOUNTER — Encounter (HOSPITAL_COMMUNITY): Payer: Self-pay | Admitting: Orthopedic Surgery

## 2023-05-01 DIAGNOSIS — Z7984 Long term (current) use of oral hypoglycemic drugs: Secondary | ICD-10-CM | POA: Insufficient documentation

## 2023-05-01 DIAGNOSIS — T8781 Dehiscence of amputation stump: Secondary | ICD-10-CM | POA: Diagnosis not present

## 2023-05-01 DIAGNOSIS — F17200 Nicotine dependence, unspecified, uncomplicated: Secondary | ICD-10-CM | POA: Diagnosis not present

## 2023-05-01 DIAGNOSIS — E119 Type 2 diabetes mellitus without complications: Secondary | ICD-10-CM | POA: Diagnosis not present

## 2023-05-01 DIAGNOSIS — G4730 Sleep apnea, unspecified: Secondary | ICD-10-CM | POA: Insufficient documentation

## 2023-05-01 DIAGNOSIS — Y835 Amputation of limb(s) as the cause of abnormal reaction of the patient, or of later complication, without mention of misadventure at the time of the procedure: Secondary | ICD-10-CM | POA: Insufficient documentation

## 2023-05-01 DIAGNOSIS — Z01812 Encounter for preprocedural laboratory examination: Secondary | ICD-10-CM | POA: Insufficient documentation

## 2023-05-01 DIAGNOSIS — J45909 Unspecified asthma, uncomplicated: Secondary | ICD-10-CM | POA: Diagnosis not present

## 2023-05-01 LAB — GLUCOSE, CAPILLARY: Glucose-Capillary: 88 mg/dL (ref 70–99)

## 2023-05-01 LAB — BASIC METABOLIC PANEL
Anion gap: 8 (ref 5–15)
BUN: 18 mg/dL (ref 6–20)
CO2: 27 mmol/L (ref 22–32)
Calcium: 8.8 mg/dL — ABNORMAL LOW (ref 8.9–10.3)
Chloride: 103 mmol/L (ref 98–111)
Creatinine, Ser: 1.06 mg/dL (ref 0.61–1.24)
GFR, Estimated: >60 mL/min (ref >60)
Glucose, Bld: 97 mg/dL (ref 70–99)
Potassium: 4 mmol/L (ref 3.5–5.1)
Sodium: 138 mmol/L (ref 135–145)

## 2023-05-01 LAB — CBC
HCT: 40.1 % (ref 39.0–52.0)
Hemoglobin: 13 g/dL (ref 13.0–17.0)
MCH: 28.3 pg (ref 26.0–34.0)
MCHC: 32.4 g/dL (ref 30.0–36.0)
MCV: 87.2 fL (ref 80.0–100.0)
Platelets: 277 10*3/uL (ref 150–400)
RBC: 4.6 MIL/uL (ref 4.22–5.81)
RDW: 14.2 % (ref 11.5–15.5)
WBC: 7.7 10*3/uL (ref 4.0–10.5)
nRBC: 0 % (ref 0.0–0.2)

## 2023-05-01 SURGERY — AMPUTATION, ABOVE KNEE
Anesthesia: General | Site: Knee | Laterality: Right

## 2023-05-01 MED ORDER — TRANEXAMIC ACID 1000 MG/10ML IV SOLN
2000.0000 mg | INTRAVENOUS | Status: DC
  Filled 2023-05-01: qty 20

## 2023-05-01 MED ORDER — CEFAZOLIN SODIUM-DEXTROSE 2-4 GM/100ML-% IV SOLN: 2.0000 g | INTRAVENOUS | Status: DC

## 2023-05-01 MED ORDER — CHLORHEXIDINE GLUCONATE 0.12 % MT SOLN: 15.0000 mL | Freq: Once | OROMUCOSAL | Status: DC

## 2023-05-01 MED ORDER — ORAL CARE MOUTH RINSE: 15.0000 mL | Freq: Once | OROMUCOSAL | Status: DC

## 2023-05-01 MED ORDER — TRANEXAMIC ACID-NACL 1000-0.7 MG/100ML-% IV SOLN: 1000.0000 mg | INTRAVENOUS | Status: DC

## 2023-05-01 MED ORDER — LACTATED RINGERS IV SOLN: INTRAVENOUS | Status: DC

## 2023-05-01 NOTE — Progress Notes (Signed)
Pt requested to wait at the front of the hospital for transport from SNF. SNF transport stated it would be approximately an hour before they got here. I offered to wait with pt, pt refused and stated he was fine to wait on his own. He was pushed in his wheelchair to the front lobby of the hospital.

## 2023-05-01 NOTE — Progress Notes (Signed)
Surgery cancelled per Dr. Lajoyce Corners and Dr. Renold Don. Report given to pt's nurse at facility, Onalee Hua. Transport from facility notified.
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
## 2023-05-08 ENCOUNTER — Encounter: Payer: Self-pay | Admitting: Vascular Surgery

## 2023-05-08 ENCOUNTER — Ambulatory Visit (INDEPENDENT_AMBULATORY_CARE_PROVIDER_SITE_OTHER): Payer: 59 | Admitting: Vascular Surgery

## 2023-05-08 VITALS — BP 160/76 | HR 82 | Temp 97.9°F | Resp 20 | Ht 73.0 in | Wt 230.0 lb

## 2023-05-08 DIAGNOSIS — M7989 Other specified soft tissue disorders: Secondary | ICD-10-CM

## 2023-05-15 ENCOUNTER — Telehealth: Payer: Self-pay

## 2023-05-15 NOTE — Telephone Encounter (Signed)
[facility] called [phone] and advised that pt does not wish to have surgery on Wednesday 05/20/2023.

## 2023-05-18 NOTE — Telephone Encounter (Signed)
Patient refusing to have surgery for BKA revision at this time. He says he doesn't think he needs it. Wants to discuss with Dr. Lajoyce Corners in office this week. Also mentioned getting a second opinion. I did schedule him to come in 05/21/23 at 9:30. FYI

## 2023-05-19 ENCOUNTER — Encounter (HOSPITAL_COMMUNITY): Payer: Self-pay | Admitting: Orthopedic Surgery

## 2023-05-20 ENCOUNTER — Telehealth: Payer: Self-pay

## 2023-05-20 ENCOUNTER — Inpatient Hospital Stay (HOSPITAL_COMMUNITY): Admission: RE | Admit: 2023-05-20 | Payer: 59 | Source: Home / Self Care | Admitting: Orthopedic Surgery

## 2023-05-20 DIAGNOSIS — S91302A Unspecified open wound, left foot, initial encounter: Secondary | ICD-10-CM

## 2023-05-20 SURGERY — AMPUTATION, ABOVE KNEE
Anesthesia: Choice | Site: Knee | Laterality: Right

## 2023-05-20 NOTE — Telephone Encounter (Signed)
Caller: Ryan [name] at [facility]  Concern: new open wound at medial ankle, measuring 0.8 cm x 0.8 cm, round, macerated at edges, weeping leg  Location: L foot  Description:  noticed today  Treatments:  cleaned, collagen placed on wound bed, wrapped  Resolution: Appointment scheduled for first available with regard to transportation  Next Appt: Appointment scheduled for 05/22/23 @ 0730 for ABI & Dr. Karin Lieu

## 2023-05-21 ENCOUNTER — Telehealth: Payer: Self-pay | Admitting: Orthopedic Surgery

## 2023-05-21 ENCOUNTER — Ambulatory Visit (INDEPENDENT_AMBULATORY_CARE_PROVIDER_SITE_OTHER): Payer: 59 | Admitting: Orthopedic Surgery

## 2023-05-21 DIAGNOSIS — T8781 Dehiscence of amputation stump: Secondary | ICD-10-CM

## 2023-05-21 DIAGNOSIS — Z89511 Acquired absence of right leg below knee: Secondary | ICD-10-CM

## 2023-05-21 MED ORDER — DOXYCYCLINE HYCLATE 100 MG PO TABS: 100.0000 mg | ORAL_TABLET | Freq: Two times a day (BID) | ORAL | 0 refills | Status: DC

## 2023-05-21 NOTE — Telephone Encounter (Signed)
Patient called asked if he can get a Rx for an Antibiotic sent the [facility] pharmacy  on [address] in [place]     The number to contact patient is  [phone]
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
## 2023-05-21 NOTE — Telephone Encounter (Signed)
Pt was seen today to discuss if revision of BKA was needed. He is requesting an abx be sent to [facility].

## 2023-05-22 ENCOUNTER — Other Ambulatory Visit: Payer: Self-pay | Admitting: Orthopedic Surgery

## 2023-05-22 ENCOUNTER — Ambulatory Visit (INDEPENDENT_AMBULATORY_CARE_PROVIDER_SITE_OTHER): Payer: 59 | Admitting: Vascular Surgery

## 2023-05-22 ENCOUNTER — Encounter: Payer: Self-pay | Admitting: Vascular Surgery

## 2023-05-22 ENCOUNTER — Ambulatory Visit (HOSPITAL_COMMUNITY)
Admission: RE | Admit: 2023-05-22 | Discharge: 2023-05-22 | Disposition: A | Discharge status: Home / Self Care | Payer: No Typology Code available for payment source | Source: Ambulatory Visit | Attending: Vascular Surgery | Admitting: Vascular Surgery

## 2023-05-22 ENCOUNTER — Telehealth: Payer: Self-pay

## 2023-05-22 VITALS — BP 155/84 | HR 88 | Temp 97.8°F | Resp 20 | Ht 73.0 in | Wt 230.0 lb

## 2023-05-22 DIAGNOSIS — S91302A Unspecified open wound, left foot, initial encounter: Secondary | ICD-10-CM | POA: Insufficient documentation

## 2023-05-22 DIAGNOSIS — S81802A Unspecified open wound, left lower leg, initial encounter: Secondary | ICD-10-CM | POA: Diagnosis not present

## 2023-05-22 DIAGNOSIS — I70202 Unspecified atherosclerosis of native arteries of extremities, left leg: Secondary | ICD-10-CM

## 2023-05-22 LAB — VAS US ABI WITH/WO TBI: Left ABI: 0.88

## 2023-05-22 MED ORDER — DOXYCYCLINE HYCLATE 100 MG PO TABS: 100.0000 mg | ORAL_TABLET | Freq: Two times a day (BID) | ORAL | 0 refills | Status: DC

## 2023-05-22 NOTE — Telephone Encounter (Signed)
Spoke to pt and his nurse Lillia Abed at [facility] regarding the plan for his wound. I have faxed her over pt's office visit note from today regarding unna boot, as she states she can do this there at the facility. No further questions/concerns at this time.

## 2023-05-22 NOTE — Telephone Encounter (Signed)
Sent to [facility]

## 2023-05-22 NOTE — Telephone Encounter (Signed)
Doxycycline didn't electronically send last night. I will resend to [facility].

## 2023-05-26 ENCOUNTER — Telehealth: Payer: Self-pay

## 2023-05-26 NOTE — Telephone Encounter (Signed)
Lillia Abed at [facility] called regarding wound care and [employer] changes.  Reviewed pt's chart, returned call for clarification, two identifiers used. Pt is refusing to wear the Unna boots d/t severe drainage and takes them off daily. She is trying to talk to him and make him understand their importance in healing. She has him currently in ABD pads, wraps, and collagen. She will attempt to talk with him again and offer to change them twice weekly. Will call office for update. Confirmed understanding.

## 2023-05-29 ENCOUNTER — Encounter: Payer: Self-pay | Admitting: Orthopedic Surgery
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
## 2023-06-02 ENCOUNTER — Ambulatory Visit (INDEPENDENT_AMBULATORY_CARE_PROVIDER_SITE_OTHER): Payer: 59 | Admitting: Family

## 2023-06-02 ENCOUNTER — Encounter: Payer: Self-pay | Admitting: Family

## 2023-06-02 DIAGNOSIS — M75101 Unspecified rotator cuff tear or rupture of right shoulder, not specified as traumatic: Secondary | ICD-10-CM | POA: Diagnosis not present
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
## 2023-06-03 MED ORDER — METHYLPREDNISOLONE ACETATE 40 MG/ML IJ SUSP
40.0000 mg | INTRAMUSCULAR | Status: AC | PRN
Start: 2023-06-02 — End: 2023-06-02
  Administered 2023-06-02: 40 mg via INTRA_ARTICULAR

## 2023-06-03 MED ORDER — LIDOCAINE HCL 1 % IJ SOLN
5.0000 mL | INTRAMUSCULAR | Status: AC | PRN
Start: 2023-06-02 — End: 2023-06-02
  Administered 2023-06-02: 5 mL

## 2023-06-18 ENCOUNTER — Ambulatory Visit: Payer: 59 | Admitting: Orthopedic Surgery
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
## 2023-06-19 ENCOUNTER — Ambulatory Visit: Payer: 59 | Admitting: Vascular Surgery

## 2023-06-19 ENCOUNTER — Encounter: Payer: Self-pay | Admitting: Vascular Surgery

## 2023-06-19 VITALS — BP 151/90 | HR 66 | Temp 98.2°F | Resp 20 | Ht 73.0 in | Wt 230.0 lb

## 2023-06-19 DIAGNOSIS — S81802D Unspecified open wound, left lower leg, subsequent encounter: Secondary | ICD-10-CM

## 2023-06-19 DIAGNOSIS — M7989 Other specified soft tissue disorders: Secondary | ICD-10-CM

## 2023-06-25 ENCOUNTER — Other Ambulatory Visit: Payer: Self-pay

## 2023-06-25 DIAGNOSIS — S81802D Unspecified open wound, left lower leg, subsequent encounter: Secondary | ICD-10-CM

## 2023-07-02 ENCOUNTER — Ambulatory Visit: Payer: 59 | Admitting: Orthopedic Surgery

## 2023-07-14 ENCOUNTER — Ambulatory Visit: Payer: 59 | Admitting: Family

## 2023-07-15 ENCOUNTER — Ambulatory Visit (INDEPENDENT_AMBULATORY_CARE_PROVIDER_SITE_OTHER): Payer: 59 | Admitting: Family

## 2023-07-15 ENCOUNTER — Encounter: Payer: Self-pay | Admitting: Family

## 2023-07-15 DIAGNOSIS — M75101 Unspecified rotator cuff tear or rupture of right shoulder, not specified as traumatic: Secondary | ICD-10-CM | POA: Diagnosis not present

## 2023-07-15 MED ORDER — METHYLPREDNISOLONE ACETATE 40 MG/ML IJ SUSP
40.0000 mg | INTRAMUSCULAR | Status: AC | PRN
Start: 2023-07-15 — End: 2023-07-15
  Administered 2023-07-15: 40 mg via INTRA_ARTICULAR

## 2023-07-15 MED ORDER — LIDOCAINE HCL 1 % IJ SOLN
5.0000 mL | INTRAMUSCULAR | Status: AC | PRN
Start: 2023-07-15 — End: 2023-07-15
  Administered 2023-07-15: 5 mL
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
## 2023-08-03 ENCOUNTER — Ambulatory Visit: Payer: 59 | Admitting: Orthopedic Surgery

## 2023-08-16 DIAGNOSIS — J189 Pneumonia, unspecified organism: Secondary | ICD-10-CM | POA: Insufficient documentation

## 2023-08-16 DIAGNOSIS — L03116 Cellulitis of left lower limb: Secondary | ICD-10-CM | POA: Insufficient documentation

## 2023-08-16 HISTORY — DX: Pneumonia, unspecified organism: J18.9

## 2023-08-16 HISTORY — DX: Cellulitis of left lower limb: L03.116

## 2023-08-18 DIAGNOSIS — I739 Peripheral vascular disease, unspecified: Secondary | ICD-10-CM | POA: Insufficient documentation

## 2023-08-18 DIAGNOSIS — L0291 Cutaneous abscess, unspecified: Secondary | ICD-10-CM | POA: Insufficient documentation

## 2023-08-18 HISTORY — DX: Morbid (severe) obesity due to excess calories: E66.01

## 2023-08-18 HISTORY — DX: Peripheral vascular disease, unspecified: I73.9

## 2023-08-18 HISTORY — DX: Cutaneous abscess, unspecified: L02.91

## 2023-08-20 DIAGNOSIS — J9612 Chronic respiratory failure with hypercapnia: Secondary | ICD-10-CM

## 2023-08-20 HISTORY — DX: Chronic respiratory failure with hypercapnia: J96.12

## 2023-09-09 ENCOUNTER — Other Ambulatory Visit: Payer: Self-pay

## 2023-09-09 DIAGNOSIS — G4730 Sleep apnea, unspecified: Secondary | ICD-10-CM | POA: Insufficient documentation

## 2023-09-09 DIAGNOSIS — K219 Gastro-esophageal reflux disease without esophagitis: Secondary | ICD-10-CM | POA: Insufficient documentation

## 2023-09-09 DIAGNOSIS — M1990 Unspecified osteoarthritis, unspecified site: Secondary | ICD-10-CM | POA: Insufficient documentation

## 2023-09-12 DIAGNOSIS — E8770 Fluid overload, unspecified: Secondary | ICD-10-CM | POA: Insufficient documentation

## 2023-09-12 HISTORY — DX: Fluid overload, unspecified: E87.70

## 2023-09-14 ENCOUNTER — Ambulatory Visit: Payer: 59

## 2023-09-17 ENCOUNTER — Ambulatory Visit: Payer: 59 | Admitting: Orthopedic Surgery

## 2023-10-16 ENCOUNTER — Ambulatory Visit: Payer: No Typology Code available for payment source

## 2023-10-16 VITALS — BP 106/58 | HR 89 | Ht 73.0 in | Wt 235.0 lb

## 2023-10-16 DIAGNOSIS — I5032 Chronic diastolic (congestive) heart failure: Secondary | ICD-10-CM

## 2023-10-16 DIAGNOSIS — I509 Heart failure, unspecified: Secondary | ICD-10-CM | POA: Insufficient documentation

## 2023-10-16 HISTORY — DX: Heart failure, unspecified: I50.9
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
## 2023-10-16 NOTE — Patient Instructions (Addendum)
Medication Instructions:  Low Salt diet Less than 2 liters of fluid daily   Lab Work: Your physician recommends that you return for lab work in: 6 months You need to have labs done when you are fasting.  You can come Monday through Friday 8:30 am to 12:00 pm and 1:15 to 4:30. You do not need to make an appointment as the order has already been placed. The labs you are going to have done are CMP, CBC   Testing/Procedures: 6 months Your physician has requested that you have an echocardiogram. Echocardiography is a painless test that uses sound waves to create images of your heart. It provides your doctor with information about the size and shape of your heart and how well your heart's chambers and valves are working. This procedure takes approximately one hour. There are no restrictions for this procedure. Please do NOT wear cologne, perfume, aftershave, or lotions (deodorant is allowed). Please arrive 15 minutes prior to your appointment time.  Please note: We ask at that you not bring children with you during ultrasound (echo/ vascular) testing. Due to room size and safety concerns, children are not allowed in the ultrasound rooms during exams. Our front office staff cannot provide observation of children in our lobby area while testing is being conducted. An adult accompanying a patient to their appointment will only be allowed in the ultrasound room at the discretion of the ultrasound technician under special circumstances. We apologize for any inconvenience.    Follow-Up: At [facility], you and your health needs are our priority.  As part of our continuing mission to provide you with exceptional heart care, we have created designated Provider Care Teams.  These Care Teams include your primary Cardiologist (physician) and Advanced Practice Providers (APPs -  Physician Assistants and Nurse Practitioners) who all work together to provide you with the care you need, when you need it.  We  recommend signing up for the patient portal called "MyChart".  Sign up information is provided on this After Visit Summary.  MyChart is used to connect with patients for Virtual Visits (Telemedicine).  Patients are able to view lab/test results, encounter notes, upcoming appointments, etc.  Non-urgent messages can be sent to your provider as well.   To learn more about what you can do with MyChart, go to [email or web address].    Your next appointment:   6 month(s)  The format for your next appointment:   In Person  Provider:   Gypsy Balsam, MD    Other Instructions NA

## 2023-10-16 NOTE — Assessment & Plan Note (Signed)
Chronic diastolic heart failure with preserved EF, volume overloaded, compensated. Recent acute exacerbation in November 2024 requiring inpatient stay for 12 days in the setting of respiratory failure and anasarca complicated by acute kidney injury.  Currently on standard regimen with amiloride 5 mg once daily and torsemide 20 mg once daily. Tolerating well.  On fluid restriction to below 2 L/day. Recommended to be on salt restriction below 2 g/day.  Continue with metoprolol succinate 12.5 mg once daily.  Will recommend continuing to follow-up closely with nephrologist regarding his underlying chronic kidney disease history.  I I will not change his diuretic regimen at this time and will defer this to the nephrologist.  Will have him come back for follow-up in 6 months and obtain a transthoracic echocardiogram prior to next visit to assess his cardiac structure and function.  Advised him to monitor for any symptoms of significant weight gain, urinary retention or reduced urine output, shortness of breath or any other symptoms suggestive of electrolyte abnormalities such as muscle cramps weakness, palpitations.

## 2023-10-23 LAB — COMPREHENSIVE METABOLIC PANEL: EGFR: 59

## 2023-10-26 ENCOUNTER — Ambulatory Visit: Payer: 59 | Admitting: Orthopedic Surgery

## 2023-11-19 ENCOUNTER — Ambulatory Visit: Payer: 59 | Admitting: Orthopedic Surgery

## 2023-12-03 ENCOUNTER — Ambulatory Visit: Payer: 59 | Admitting: Orthopedic Surgery
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
## 2023-12-17 ENCOUNTER — Ambulatory Visit: Payer: 59 | Admitting: Vascular Surgery

## 2023-12-17 ENCOUNTER — Ambulatory Visit (HOSPITAL_COMMUNITY): Payer: No Typology Code available for payment source | Attending: Vascular Surgery

## 2023-12-18 ENCOUNTER — Ambulatory Visit: Payer: 59 | Admitting: Vascular Surgery

## 2023-12-18 ENCOUNTER — Encounter (HOSPITAL_COMMUNITY): Payer: 59

## 2024-01-11 DIAGNOSIS — J9601 Acute respiratory failure with hypoxia: Secondary | ICD-10-CM

## 2024-01-11 DIAGNOSIS — J449 Chronic obstructive pulmonary disease, unspecified: Secondary | ICD-10-CM

## 2024-01-11 HISTORY — DX: Acute respiratory failure with hypoxia: J96.01

## 2024-01-11 HISTORY — DX: Chronic obstructive pulmonary disease, unspecified: J44.9

## 2024-01-22 ENCOUNTER — Telehealth: Payer: Self-pay

## 2024-01-22 NOTE — Telephone Encounter (Signed)
 LVM for pt to call and r/s appt due to schedule change/kbl 01/22/24

## 2024-01-25 NOTE — Telephone Encounter (Signed)
 Rescheduled with facility/kbl 01/25/24
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
## 2024-02-04 ENCOUNTER — Ambulatory Visit (HOSPITAL_COMMUNITY): Payer: 59

## 2024-02-04 ENCOUNTER — Ambulatory Visit: Payer: 59 | Admitting: Vascular Surgery
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
## 2024-03-24 ENCOUNTER — Ambulatory Visit (HOSPITAL_COMMUNITY)
Admission: RE | Admit: 2024-03-24 | Discharge: 2024-03-24 | Disposition: A | Discharge status: Home / Self Care | Source: Ambulatory Visit | Attending: Vascular Surgery | Admitting: Vascular Surgery

## 2024-03-24 ENCOUNTER — Encounter: Payer: Self-pay | Admitting: Vascular Surgery

## 2024-03-24 ENCOUNTER — Ambulatory Visit: Attending: Vascular Surgery | Admitting: Vascular Surgery

## 2024-03-24 VITALS — BP 120/77 | HR 77 | Temp 98.0°F | Resp 20 | Ht 73.0 in | Wt 235.0 lb

## 2024-03-24 DIAGNOSIS — S81802D Unspecified open wound, left lower leg, subsequent encounter: Secondary | ICD-10-CM

## 2024-03-24 DIAGNOSIS — S91302A Unspecified open wound, left foot, initial encounter: Secondary | ICD-10-CM

## 2024-03-24 LAB — VAS US ABI WITH/WO TBI: Left ABI: 1.12

## 2024-03-29 ENCOUNTER — Other Ambulatory Visit: Payer: Self-pay | Admitting: *Deleted

## 2024-03-29 DIAGNOSIS — S81802A Unspecified open wound, left lower leg, initial encounter: Secondary | ICD-10-CM

## 2024-04-08 ENCOUNTER — Ambulatory Visit

## 2024-04-15 ENCOUNTER — Other Ambulatory Visit: Payer: 59

## 2024-04-19 ENCOUNTER — Ambulatory Visit: Payer: 59

## 2024-05-11 ENCOUNTER — Ambulatory Visit

## 2024-05-11 DIAGNOSIS — I5032 Chronic diastolic (congestive) heart failure: Secondary | ICD-10-CM | POA: Diagnosis not present

## 2024-05-12 ENCOUNTER — Ambulatory Visit: Payer: Self-pay

## 2024-05-12 LAB — ECHOCARDIOGRAM COMPLETE
Area-P 1/2: 3.96 cm2
S' Lateral: 3.4 cm

## 2024-05-12 NOTE — Progress Notes (Signed)
 [facility]  Referred by: Self, Referral  Chief Complaint: Wound  PMH Includes: - DM2 - Tobacco use  Initial History 07/16/23:  Here for evaluation of bilateral leg ulcerations. The ulcerations have been present for several months. He reports his left leg was draining heavily. That has stopped but now he has drainage in his toes. His right leg was about healed but it is draining again due to swelling.  Denies any f/c/n/v.  Continues to smoke 1 PPD He is in a wheelchair, he reports he cannot lay flat due to SOB. He reports response to diuretics but is not on them now. Unclear why. He reports they were helpful.    Reviewed notes:  Admitted in April 2024 at [facility] due to multiple wounds. He was noted to be noncompliant with follow ups and medications. He had two surgeries performed on 4/19. He had I&D of R posterior shoulder and left abscess followed by revision of R BKA.  06/19/23: saw vascular at [facility] Dr. Lanis due to R side BKA and LLE venous stasis ulcerations. Does not elevate his legs. Needs to elevate his legs and use compression. Revasc will not be helpful.   Relevant work up/results: ABI  7 /24 A1c > 15.5 4/24 Hgb 13.0 6/24  ABI 7/24   Reviewed recent echo from 4/24 Normal EF. R Ventricular function is normal. Mild aortic valve thickening.   Interval history/progress: Initial visit - see above  07/23/23 Toes are drier. Less drainage. His amputation on R is macerated. He reports it was not changed for a few days.  He is feeling well Happyw ith progress.   08/11/23 Both wounds are macerated. He reports dressings are not being changed but every 2-3 days. They are staying wet He is trying to elevate more.   08/31/23 He reports bandages are not being changed. His feet is staying wet causing maceration. They do not change them regularly.  He is trying to stay in the bed more.   10/05/23 His R BKA looks improved. Left foot appears healthier He  is feeling well He reports nurses at facility are changing bandages at least 1x per day No systemic symptoms   Admitted 11/2-11/14/24. Due to fluid overload. He was diuresed regularly. Tasnitioned to torsemide daily. Also started amiloride.  Left foot ulceration. Treated with abx for cellulitis. Used aquacel in the hospital.  CMP with albumin 3.3, Cr 1.44 09/20/23 A1c 10 08/15/23 CBC with Hgb 12.1 09/23/23 XR left foot 09/12/23: no osteomyelitis.   11/02/23 Wounds are improving.  No f/c/n/v.   11/19/23 Wound is progressing. Healthier.  Doing well. No concerns.   12/10/23 Here for evaluation of left leg ulcerations.  His edema has remained down since hospitalization Toes are not draining    02/08/24 His ankle is healed.  Toes are healed New ulceration between 4th and 5th digits on L. This started a month ago. Area is stable. No change No f/c/n/v. Have ben using aquacell.   03/04/24 Overall he is stable.  Left 5th MTH with new ulceration - started last week. Unclear how.  New wound on left leg - from swelling He believes he is building up fluid again. Denies f/c/n/v.   04-11-24 Wounds are doing well.  He has not done his arterial studies yet.   04/28/2024  Patient states he believes the wound is to wet.  They are changing it 3 times a week however it looks macerated from his vantage point.  He denies any  fever.  He states his glucose is not well-controlled running between 200 and 300.  He states they are not doing a good job with managing that.  He also continues to smoke and we have discussed smoking cessation.  He does not appear to be interested. Reviewed EHR  May 13, 2024 Here for follow up. He is overall doing ok. He reports having SOB. They are titration his lasix  at the facility. He has oxygen he has been using.  Leg is doing ok. He notes his left foot continues to turn out and get pressure. He has not received an offloading boot Denies f/c/n/v.    Arterial studies from  03/24/24  Vitals:   May 13, 2024 1454  BP: 119/70  Pulse: 84  Temp: 98.6 F (37 C)  SpO2: 90%     Exam: Pt is AA in NAD. Oriented x 3 Wound(s) exam:  Left leg:  Superficial erosion on anterior leg. No evidence of infection.   Lateral aspect of 4th toe at IPJ with stable granular ulceration. No infection  Lateral aspect of 5th MTH with maceration noted. There is some slough present. No debridement per request. No bone palpable.  No acute infectious signs.   Edema is improved to where he has been.   Wound 07/16/23 Diabetic Ulcer Toes (Comment which ones) Left (Active)  Date First Assessed/Time First Assessed: 07/16/23 0819   Pre-Existing Wound: Yes  Primary Wound Type: Diabetic Ulcer  Location: Toes (Comment which ones)  Wound Location Orientation: Left    Assessments 07/16/2023  8:48 AM 13-May-2024  2:52 PM  Wound Image      Site Assessment Clean;Slough Pale  Peri-Wound Assessment Weeping;Thin Clean  Diabetic Ulcer Wagner Grade Grade 2 --  Drainage Amount Small Scant  Drainage Description Clear --  Wound Odor None None  Treatments -- Cleansed  Wound Length (cm) 3 cm 0.2 cm  Wound Width (cm) 9 cm 0.3 cm  Wound Surface Area (cm^2) 27 cm^2 0.05 cm^2  Wound Depth (cm) 0.1 cm 0.1 cm  Wound Volume (cm^3) 2.7 cm^3 0.003 cm^3  Wound Healing % -- 100  Non-staged Wound Description Full thickness Full thickness     Active Orders  Date Order Priority Status Authorizing Provider  13-May-2024 1611 Wound Care Routine Active Fonda Charlie Morita, MD    - Wound cleansing::    Keep dry    - Dressing change frequency::    Change dressing every other day    - Primary wound dressing::    Other (Specify) (Gentian Violet, Xeroform,Hydrofera blue transfer)    - Secondary wound dressing::    Other (Specify) (dry dressing)    - Edema control::    Unna boot to left lower extremity    - Additional Orders/Instructions::    Follow nutritous diet    - Follow Up Appointment::    Return to clinic in 2 weeks   04/28/24 1610 Wound Care Routine Active Jerilynn Cheryl Pouch, MD    - Wound cleansing::    Keep dry    - Dressing change frequency::    Change dressing three times a week    - Primary wound dressing::    Other (Specify) (Gentian Violet, xeroform,hydrofera blue transfer)    - Secondary wound dressing::    Other (Specify) (dry dressing)    - Edema control::    Unna boot to left lower extremity    - Additional Orders/Instructions::    Follow nutritous diet    - Follow Up Appointment::    Return to clinic  in 2 weeks  03/04/24 1045 Wound Care Routine Active Fonda Charlie Morita, MD    - Wound cleansing::    Keep dry    - Dressing change frequency::    Do not change dressing for an entire week    - Primary wound dressing::    Other (Specify) (Hydrofera blue transfer,gentian violet,xeroform)    - Secondary wound dressing::    Other (Specify) (dry dressing)    - Edema control::    Unna boot to left lower extremity    - Additional Orders/Instructions::    Follow nutritous diet    - Follow Up Appointment::    Return to clinic in 3 weeks  02/08/24 1439 Wound Care Diabetic Ulcer Left Toes (Comment which ones) Routine Active Fonda Charlie Morita, MD    - Wound cleansing::    Other (Specify) (soap and water)    - Dressing change frequency::    Change dressing twice a day    - Primary wound dressing::    Other (Specify) (Hydrofera blue transfer)    - Secondary wound dressing::    2x2 Gauze    - Secure with::    Roll gauze    - Secure with::    ACE Wrap    - Additional Orders/Instructions::    Follow nutritous diet    - Follow Up Appointment::    Return to clinic in 3 weeks  12/10/23 1204 Wound Care Routine Active Fonda Charlie Morita, MD    - Wound cleansing::    Other (Specify) (soap and water)    - Dressing change frequency::    Change dressing twice a day    - Primary wound dressing::    Other (Specify) (Betadine  paint,aquacel Ag,xeroform)    - Secondary wound dressing::    Other (Specify)  (dry dressing)    - Secure with::    Roll gauze    - Additional Orders/Instructions::    Follow nutritous diet    - Follow Up Appointment::    Return to clinic in 1 week  11/19/23 1612 Wound Care Routine Active Fonda Charlie Morita, MD    - Wound cleansing::    Other (Specify) (soap and water)    - Dressing change frequency::    Change dressing twice a day    - Primary wound dressing::    Other (Specify) (Aquacel AG, Santyl )    - Secondary wound dressing::    Other (Specify) (Dry Dressing)    - Edema control::    Other (Specify) (Edemawear, ACE bandage)    - Additional Orders/Instructions::    Follow nutritous diet    - Additional Orders/Instructions::    Stop/Decrease smoking    - Follow Up Appointment::    Return to clinic in 2 weeks  11/02/23 0851 Wound Care Routine Active Fonda Charlie Morita, MD    - Wound cleansing::    Other (Specify) (soap and water)    - Dressing change frequency::    Change dressing twice a day    - Primary wound dressing::    Other (Specify) (Betadine ,Aquacel Ag)    - Secondary wound dressing::    Other (Specify) (dry dressing)    - Secure with::    Roll gauze    - Secure with::    ACE Wrap    - Additional Orders/Instructions::    Follow nutritous diet  10/19/23 0838 Wound Care Routine Active Curtistine Rush Recardo Raddle., MD    - Wound cleansing::    Other (Specify) (soap and water)    -  Dressing change frequency::    Change dressing twice a day    - Primary wound dressing::    Other (Specify) (Betadine  paint,Aquacel AG)    - Secondary wound dressing::    Other (Specify) (dry dressing)    - Secure with::    Roll gauze    - Additional Orders/Instructions::    Follow nutritous diet    - Follow Up Appointment::    Return to clinic in 2 weeks  10/05/23 0943 Wound Care Routine Active Fonda Charlie Morita, MD    - Wound cleansing::    Other (Specify) (soap and water)    - Dressing change frequency::    Change dressing twice a day    - Primary wound dressing::     Other (Specify) (Betadine  Paint, Aquacel AG)    - Secondary wound dressing::    4x4 Gauze    - Additional Orders/Instructions::    Follow nutritous diet    - Follow Up Appointment::    Return to clinic in 2 weeks  08/31/23 1649 Wound Care Diabetic Ulcer Left Toes (Comment which ones) Routine Active Fonda Charlie Morita, MD    - Wound cleansing::    Other (Specify) (soap and water)    - Dressing change frequency::    Change dressing twice a day    - Primary wound dressing::    Other (Specify) (Betadine  paint,aquacel AG)    - Secondary wound dressing::    Other (Specify) (dry dressing)    - Secure with::    ACE Wrap    - Secure with::    Roll gauze    - Additional Orders/Instructions::    Follow nutritous diet  08/11/23 1226 Wound Care Routine Active Fonda Charlie Morita, MD    - Wound cleansing::    Other (Specify) (soap and water)    - Dressing change frequency::    Change dressing twice a day    - Primary wound dressing::    Other (Specify) (Aquacel Ag, Betadine  paint)    - Secondary wound dressing::    Other (Specify) (dry dressing)    - Additional Orders/Instructions::    Follow nutritous diet  07/23/23 1144 Wound Care Routine Active Fonda Charlie Morita, MD    - Wound cleansing::    Other (Specify) (soap and water)    - Dressing change frequency::    Change dressing every day    - Primary wound dressing::    Other (Specify) (Aquacel Ag, Betadine  Paint)    - Secondary wound dressing::    Other (Specify) (dry dressing)    - Additional Orders/Instructions::    Follow nutritous diet    - Follow Up Appointment::    Return to clinic in 1 week  07/16/23 1354 Wound Care Routine Active Fonda Charlie Morita, MD    - Wound cleansing::    Other (Specify) (soap and water)    - Dressing change frequency::    Change dressing every day    - Primary wound dressing::    Other (Specify) (Santyl  and betadine  Paint)    - Secondary wound dressing::    4x4 Gauze    - Additional Orders/Instructions::     Follow nutritous diet    - Follow Up Appointment::    Return to clinic in 1 week     Wound 03/04/24 Venous Ulcer Pretibial Left (Active)  Date First Assessed/Time First Assessed: 03/04/24 0947   Pre-Existing Wound: Yes  Primary Wound Type: Venous Ulcer  Location: Pretibial  Wound Location Orientation: Left  Assessments 03/04/2024  9:48 AM 05/12/2024  2:51 PM  Wound Image      Site Assessment Granulation tissue Granulation tissue  Peri-Wound Assessment Callus Blanchable erythema  Drainage Amount Moderate Small  Drainage Description Serosanguineous Serosanguineous  Wound Odor None None  Treatments Cleansed Cleansed  Wound Cleanser Antimicrobial solution --  Wound Length (cm) 1.5 cm 3.5 cm  Wound Width (cm) 2.5 cm 1 cm  Wound Surface Area (cm^2) 3.75 cm^2 2.75 cm^2  Wound Depth (cm) 0.1 cm 0.1 cm  Wound Volume (cm^3) 0.375 cm^3 0.183 cm^3  Wound Healing % -- 51  Non-staged Wound Description Full thickness Full thickness     Active Orders  Date Order Priority Status Authorizing Provider  05/12/24 1611 Wound Care Routine Active Fonda Charlie Morita, MD    - Wound cleansing::    Keep dry    - Dressing change frequency::    Change dressing every other day    - Primary wound dressing::    Other (Specify) (Gentian Violet, Xeroform,Hydrofera blue transfer)    - Secondary wound dressing::    Other (Specify) (dry dressing)    - Edema control::    Unna boot to left lower extremity    - Additional Orders/Instructions::    Follow nutritous diet    - Follow Up Appointment::    Return to clinic in 2 weeks  04/28/24 1610 Wound Care Routine Active Jerilynn Cheryl Pouch, MD    - Wound cleansing::    Keep dry    - Dressing change frequency::    Change dressing three times a week    - Primary wound dressing::    Other (Specify) (Gentian Violet, xeroform,hydrofera blue transfer)    - Secondary wound dressing::    Other (Specify) (dry dressing)    - Edema control::    Unna boot to left lower  extremity    - Additional Orders/Instructions::    Follow nutritous diet    - Follow Up Appointment::    Return to clinic in 2 weeks  03/04/24 1045 Wound Care Routine Active Fonda Charlie Morita, MD    - Wound cleansing::    Keep dry    - Dressing change frequency::    Do not change dressing for an entire week    - Primary wound dressing::    Other (Specify) (Hydrofera blue transfer,gentian violet,xeroform)    - Secondary wound dressing::    Other (Specify) (dry dressing)    - Edema control::    Unna boot to left lower extremity    - Additional Orders/Instructions::    Follow nutritous diet    - Follow Up Appointment::    Return to clinic in 3 weeks     Wound 03/04/24 Venous Ulcer Foot Left;Lateral (Active)  Date First Assessed/Time First Assessed: 03/04/24 0947   Pre-Existing Wound: Yes  Primary Wound Type: Venous Ulcer  Location: Foot  Wound Location Orientation: Left;Lateral    Assessments 03/04/2024  9:49 AM 05/12/2024  2:52 PM  Wound Image      Site Assessment Slough Red  Peri-Wound Assessment Maceration Maceration  Drainage Amount Moderate Moderate  Drainage Description Serosanguineous Serosanguineous  Wound Odor None None  Treatments Cleansed Cleansed  Wound Cleanser Antimicrobial solution --  Wound Length (cm) 1 cm 2 cm  Wound Width (cm) 1.2 cm 2.5 cm  Wound Surface Area (cm^2) 1.2 cm^2 3.93 cm^2  Wound Depth (cm) 0.1 cm 0.2 cm  Wound Volume (cm^3) 0.12 cm^3 0.524 cm^3  Wound Healing % -- -337  Non-staged  Wound Description Full thickness Full thickness     Active Orders  Date Order Priority Status Authorizing Provider  05/12/24 1611 Wound Care Routine Active Fonda Charlie Morita, MD    - Wound cleansing::    Keep dry    - Dressing change frequency::    Change dressing every other day    - Primary wound dressing::    Other (Specify) (Gentian Violet, Xeroform,Hydrofera blue transfer)    - Secondary wound dressing::    Other (Specify) (dry dressing)    - Edema control::     Unna boot to left lower extremity    - Additional Orders/Instructions::    Follow nutritous diet    - Follow Up Appointment::    Return to clinic in 2 weeks  04/28/24 1610 Wound Care Routine Active Jerilynn Cheryl Pouch, MD    - Wound cleansing::    Keep dry    - Dressing change frequency::    Change dressing three times a week    - Primary wound dressing::    Other (Specify) (Gentian Violet, xeroform,hydrofera blue transfer)    - Secondary wound dressing::    Other (Specify) (dry dressing)    - Edema control::    Unna boot to left lower extremity    - Additional Orders/Instructions::    Follow nutritous diet    - Follow Up Appointment::    Return to clinic in 2 weeks  03/04/24 1045 Wound Care Routine Active Fonda Charlie Morita, MD    - Wound cleansing::    Keep dry    - Dressing change frequency::    Do not change dressing for an entire week    - Primary wound dressing::    Other (Specify) (Hydrofera blue transfer,gentian violet,xeroform)    - Secondary wound dressing::    Other (Specify) (dry dressing)    - Edema control::    Unna boot to left lower extremity    - Additional Orders/Instructions::    Follow nutritous diet    - Follow Up Appointment::    Return to clinic in 3 weeks     Inactive Orders  Date Order Priority Status Authorizing Provider  04/28/24 1535 Debridement Venous Ulcer Left;Lateral Foot Routine Completed Jerilynn Cheryl Pouch, MD    Assessment Encounter Diagnoses  Name Primary?  . Ulcer of left foot, limited to breakdown of skin    (CMD) Yes  . 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  Left foot - all toes healed except for 4th toe and lateral 5th MTH. This wound is due to pressure. Again, emphasized offloading boot. Recommended z flex or prevalon boot. Discussed how to get. Will attempt to get facility to arrange.   Again, he appears to be fluid overload. His oxygen saturation was 90%.  Encouraged him to take lasix  and montior oxygen. If still struggling, advised ED evaluation. Discussed daily weights to monitor.   Arterial studies with multiphasic waveforms and TP of 94 should be able to heal. Encouraged him to offload aggressively.  Labs from 6-10 shown hgb of 12.2 and Albumin of 4.0 both of which are adequate ot heal.    PLAN: Left Toes and left lateral foot: Apply Gentian violet,Xeroform, Hydrofera blue transfer and  in between 5th and 4th toes,dry dressing, Space the  toes with gauze, unna boot. Change 3 times a week or as needed due to drainage. Can use Barrier cream if needed.  Offloading boot recommended again today  Elevate leg High protein diet   He is smoking 1 PPD and not interested in quitting.  Smoking cessation counseling given Continue on lasix  dose per facility. If worsening, head to ED DM2 - uncontrolled. Remains high.    F/u 2 week sooner if needed     Orders Placed This Encounter  Procedures  . Wound Care    Left Toes and left lateral foot: Apply Gentian violet,Xeroform, Hydrofera blue transfer and  in between 5th and 4th toes,dry dressing, Space the  toes with gauze, unna boot. Change 3 times a week or as needed due to drainage. Can use Barrier cream if needed.   Left Leg: Apply Gentian Violet, Xeroform, dry dressing.,unna boot.  Change 3 times a week or as needed due to drainage. Can use Barrier cream if needed.    Wound cleansing::   Keep dry    Dressing change frequency::   Change dressing every other day    Primary wound dressing::   Other (Specify)             Gentian Violet, Xeroform,Hydrofera blue transfer    Secondary wound dressing::   Other (Specify)             dry dressing    Edema control::   Unna boot to left lower extremity    Additional Orders/Instructions::   Follow nutritous diet    Follow Up Appointment::   Return to clinic in 2 weeks    Fonda Charlie Morita 05/12/24   Past Medical History:  Diagnosis Date  .  Chronic pain   . COPD (chronic obstructive pulmonary disease)    (CMD)   . Diabetes mellitus    (CMD)   . History of MRSA infection   . Hypertension    pt denies takes for kidney protection  . Osteomyelitis of foot, right, acute    (CMD)

## 2024-05-19 ENCOUNTER — Ambulatory Visit

## 2024-05-19 VITALS — BP 130/80 | HR 79 | Ht 73.0 in | Wt 236.0 lb

## 2024-05-19 DIAGNOSIS — F17200 Nicotine dependence, unspecified, uncomplicated: Secondary | ICD-10-CM

## 2024-05-19 DIAGNOSIS — I5032 Chronic diastolic (congestive) heart failure: Secondary | ICD-10-CM | POA: Diagnosis not present

## 2024-05-19 MED ORDER — BUMETANIDE 1 MG PO TABS
ORAL_TABLET | ORAL | 3 refills | Status: DC
Start: 2024-05-19 — End: 2024-09-19

## 2024-05-19 NOTE — Assessment & Plan Note (Signed)
 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 Appears volume overloaded. Has not been weighed regularly at his rehab facility. Will check his weight today on the scale at home.  Continue with salt restriction below 2 g/day. Fluid restriction below 2 L/day.  Titrate up diuretic regimen by switching from torsemide to Bumex  2 mg twice daily for the next 5 days. After 5 days lower the Bumex  dose to 1 mg twice daily.  Repeat blood work BMP, magnesium , BNP after 5 days.  Return to the clinic for follow-up with me in 2 weeks.  Continue hydralazine  25 mg 3 times daily Continue with Jardiance 10 mg once daily Continue metoprolol  tartrate 12.5 mg, adjust the dose to twice daily.

## 2024-05-19 NOTE — Assessment & Plan Note (Signed)
 Advised strongly to quit smoking habit.  He is requesting information about [employer] product for oxygen concentrator.  I am unsure how to go about ordering this.  Will find out more information and will review at next follow-up visit.

## 2024-05-19 NOTE — Patient Instructions (Signed)
 Medication Instructions:  Your physician has recommended you make the following change in your medication:   STOP: Torsemide START: Bumex  1 mg (Take 2 mg two times daily for 5 days, then switch to 1 mg two times daily)  *If you need a refill on your cardiac medications before your next appointment, please call your pharmacy*  Lab Work: Your physician recommends that you return for lab work in:   Labs in 7 days: BMP, Magnesium , Pro BNP  If you have labs (blood work) drawn today and your tests are completely normal, you will receive your results only by: MyChart Message (if you have MyChart) OR A paper copy in the mail If you have any lab test that is abnormal or we need to change your treatment, we will call you to review the results.  Testing/Procedures: None  Follow-Up: At [facility], you and your health needs are our priority.  As part of our continuing mission to provide you with exceptional heart care, our providers are all part of one team.  This team includes your primary Cardiologist (physician) and Advanced Practice Providers or APPs (Physician Assistants and Nurse Practitioners) who all work together to provide you with the care you need, when you need it.  Your next appointment:   2 week(s)  Provider:   Alean Kobus, MD    We recommend signing up for the patient portal called MyChart.  Sign up information is provided on this After Visit Summary.  MyChart is used to connect with patients for Virtual Visits (Telemedicine).  Patients are able to view lab/test results, encounter notes, upcoming appointments, etc.  Non-urgent messages can be sent to your provider as well.   To learn more about what you can do with MyChart, go to [email or web address].   Other Instructions None
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
## 2024-06-02 ENCOUNTER — Ambulatory Visit
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
## 2024-06-30 ENCOUNTER — Ambulatory Visit (HOSPITAL_COMMUNITY): Admission: RE | Admit: 2024-06-30 | Source: Ambulatory Visit

## 2024-06-30 ENCOUNTER — Ambulatory Visit: Admitting: Vascular Surgery

## 2024-07-08 ENCOUNTER — Ambulatory Visit
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
## 2024-08-16 ENCOUNTER — Other Ambulatory Visit: Payer: Self-pay

## 2024-08-16 ENCOUNTER — Encounter (HOSPITAL_COMMUNITY): Payer: Self-pay

## 2024-08-16 ENCOUNTER — Inpatient Hospital Stay (HOSPITAL_COMMUNITY)
Admission: EM | Admit: 2024-08-16 | Discharge: 2024-10-10 | DRG: 853 | Disposition: E | Attending: Internal Medicine | Admitting: Internal Medicine

## 2024-08-16 DIAGNOSIS — Z7409 Other reduced mobility: Secondary | ICD-10-CM | POA: Diagnosis present

## 2024-08-16 DIAGNOSIS — E875 Hyperkalemia: Secondary | ICD-10-CM | POA: Diagnosis not present

## 2024-08-16 DIAGNOSIS — I890 Lymphedema, not elsewhere classified: Secondary | ICD-10-CM | POA: Diagnosis present

## 2024-08-16 DIAGNOSIS — E11621 Type 2 diabetes mellitus with foot ulcer: Secondary | ICD-10-CM | POA: Diagnosis present

## 2024-08-16 DIAGNOSIS — Z66 Do not resuscitate: Secondary | ICD-10-CM | POA: Diagnosis not present

## 2024-08-16 DIAGNOSIS — Z635 Disruption of family by separation and divorce: Secondary | ICD-10-CM

## 2024-08-16 DIAGNOSIS — R6521 Severe sepsis with septic shock: Secondary | ICD-10-CM | POA: Diagnosis not present

## 2024-08-16 DIAGNOSIS — E1169 Type 2 diabetes mellitus with other specified complication: Secondary | ICD-10-CM | POA: Diagnosis present

## 2024-08-16 DIAGNOSIS — R739 Hyperglycemia, unspecified: Secondary | ICD-10-CM

## 2024-08-16 DIAGNOSIS — L97829 Non-pressure chronic ulcer of other part of left lower leg with unspecified severity: Secondary | ICD-10-CM | POA: Diagnosis present

## 2024-08-16 DIAGNOSIS — M869 Osteomyelitis, unspecified: Secondary | ICD-10-CM

## 2024-08-16 DIAGNOSIS — F17210 Nicotine dependence, cigarettes, uncomplicated: Secondary | ICD-10-CM | POA: Diagnosis present

## 2024-08-16 DIAGNOSIS — N289 Disorder of kidney and ureter, unspecified: Secondary | ICD-10-CM | POA: Diagnosis present

## 2024-08-16 DIAGNOSIS — Z89511 Acquired absence of right leg below knee: Secondary | ICD-10-CM

## 2024-08-16 DIAGNOSIS — J449 Chronic obstructive pulmonary disease, unspecified: Secondary | ICD-10-CM | POA: Diagnosis present

## 2024-08-16 DIAGNOSIS — Z515 Encounter for palliative care: Secondary | ICD-10-CM

## 2024-08-16 DIAGNOSIS — J80 Acute respiratory distress syndrome: Secondary | ICD-10-CM | POA: Diagnosis present

## 2024-08-16 DIAGNOSIS — I5032 Chronic diastolic (congestive) heart failure: Secondary | ICD-10-CM | POA: Diagnosis present

## 2024-08-16 DIAGNOSIS — I70262 Atherosclerosis of native arteries of extremities with gangrene, left leg: Secondary | ICD-10-CM | POA: Diagnosis not present

## 2024-08-16 DIAGNOSIS — Z6834 Body mass index (BMI) 34.0-34.9, adult: Secondary | ICD-10-CM

## 2024-08-16 DIAGNOSIS — M009 Pyogenic arthritis, unspecified: Secondary | ICD-10-CM | POA: Diagnosis present

## 2024-08-16 DIAGNOSIS — B9562 Methicillin resistant Staphylococcus aureus infection as the cause of diseases classified elsewhere: Secondary | ICD-10-CM

## 2024-08-16 DIAGNOSIS — I110 Hypertensive heart disease with heart failure: Secondary | ICD-10-CM | POA: Diagnosis present

## 2024-08-16 DIAGNOSIS — L03116 Cellulitis of left lower limb: Secondary | ICD-10-CM | POA: Diagnosis present

## 2024-08-16 DIAGNOSIS — E871 Hypo-osmolality and hyponatremia: Secondary | ICD-10-CM | POA: Diagnosis present

## 2024-08-16 DIAGNOSIS — E11628 Type 2 diabetes mellitus with other skin complications: Secondary | ICD-10-CM | POA: Diagnosis present

## 2024-08-16 DIAGNOSIS — L089 Local infection of the skin and subcutaneous tissue, unspecified: Secondary | ICD-10-CM

## 2024-08-16 DIAGNOSIS — F1110 Opioid abuse, uncomplicated: Secondary | ICD-10-CM | POA: Diagnosis present

## 2024-08-16 DIAGNOSIS — L539 Erythematous condition, unspecified: Secondary | ICD-10-CM

## 2024-08-16 DIAGNOSIS — I97121 Postprocedural cardiac arrest following other surgery: Secondary | ICD-10-CM | POA: Diagnosis not present

## 2024-08-16 DIAGNOSIS — E785 Hyperlipidemia, unspecified: Secondary | ICD-10-CM | POA: Diagnosis present

## 2024-08-16 DIAGNOSIS — Z794 Long term (current) use of insulin: Secondary | ICD-10-CM

## 2024-08-16 DIAGNOSIS — R7982 Elevated C-reactive protein (CRP): Secondary | ICD-10-CM | POA: Diagnosis present

## 2024-08-16 DIAGNOSIS — A4102 Sepsis due to Methicillin resistant Staphylococcus aureus: Secondary | ICD-10-CM | POA: Diagnosis not present

## 2024-08-16 DIAGNOSIS — E872 Acidosis, unspecified: Secondary | ICD-10-CM | POA: Diagnosis present

## 2024-08-16 DIAGNOSIS — R7881 Bacteremia: Secondary | ICD-10-CM

## 2024-08-16 DIAGNOSIS — Z791 Long term (current) use of non-steroidal anti-inflammatories (NSAID): Secondary | ICD-10-CM

## 2024-08-16 DIAGNOSIS — E1152 Type 2 diabetes mellitus with diabetic peripheral angiopathy with gangrene: Secondary | ICD-10-CM | POA: Diagnosis not present

## 2024-08-16 DIAGNOSIS — T8744 Infection of amputation stump, left lower extremity: Secondary | ICD-10-CM | POA: Diagnosis not present

## 2024-08-16 DIAGNOSIS — Y838 Other surgical procedures as the cause of abnormal reaction of the patient, or of later complication, without mention of misadventure at the time of the procedure: Secondary | ICD-10-CM | POA: Diagnosis not present

## 2024-08-16 DIAGNOSIS — I272 Pulmonary hypertension, unspecified: Secondary | ICD-10-CM | POA: Diagnosis present

## 2024-08-16 DIAGNOSIS — K219 Gastro-esophageal reflux disease without esophagitis: Secondary | ICD-10-CM | POA: Diagnosis present

## 2024-08-16 DIAGNOSIS — T8781 Dehiscence of amputation stump: Secondary | ICD-10-CM | POA: Diagnosis present

## 2024-08-16 DIAGNOSIS — Z79899 Other long term (current) drug therapy: Secondary | ICD-10-CM

## 2024-08-16 DIAGNOSIS — Z89512 Acquired absence of left leg below knee: Secondary | ICD-10-CM

## 2024-08-16 DIAGNOSIS — Z716 Tobacco abuse counseling: Secondary | ICD-10-CM

## 2024-08-16 DIAGNOSIS — Z79891 Long term (current) use of opiate analgesic: Secondary | ICD-10-CM

## 2024-08-16 DIAGNOSIS — Z7984 Long term (current) use of oral hypoglycemic drugs: Secondary | ICD-10-CM

## 2024-08-16 DIAGNOSIS — G8929 Other chronic pain: Secondary | ICD-10-CM | POA: Diagnosis present

## 2024-08-16 DIAGNOSIS — L97529 Non-pressure chronic ulcer of other part of left foot with unspecified severity: Secondary | ICD-10-CM | POA: Diagnosis present

## 2024-08-16 DIAGNOSIS — I509 Heart failure, unspecified: Secondary | ICD-10-CM

## 2024-08-16 DIAGNOSIS — M86272 Subacute osteomyelitis, left ankle and foot: Principal | ICD-10-CM | POA: Diagnosis present

## 2024-08-16 DIAGNOSIS — E1165 Type 2 diabetes mellitus with hyperglycemia: Secondary | ICD-10-CM | POA: Diagnosis present

## 2024-08-16 DIAGNOSIS — Y835 Amputation of limb(s) as the cause of abnormal reaction of the patient, or of later complication, without mention of misadventure at the time of the procedure: Secondary | ICD-10-CM | POA: Diagnosis not present

## 2024-08-16 DIAGNOSIS — M86671 Other chronic osteomyelitis, right ankle and foot: Secondary | ICD-10-CM | POA: Diagnosis present

## 2024-08-16 DIAGNOSIS — E1142 Type 2 diabetes mellitus with diabetic polyneuropathy: Secondary | ICD-10-CM | POA: Diagnosis present

## 2024-08-16 DIAGNOSIS — G4733 Obstructive sleep apnea (adult) (pediatric): Secondary | ICD-10-CM | POA: Diagnosis present

## 2024-08-16 DIAGNOSIS — M62838 Other muscle spasm: Secondary | ICD-10-CM | POA: Diagnosis not present

## 2024-08-16 DIAGNOSIS — G931 Anoxic brain damage, not elsewhere classified: Secondary | ICD-10-CM | POA: Diagnosis not present

## 2024-08-16 DIAGNOSIS — F17200 Nicotine dependence, unspecified, uncomplicated: Secondary | ICD-10-CM | POA: Diagnosis present

## 2024-08-16 DIAGNOSIS — M65972 Unspecified synovitis and tenosynovitis, left ankle and foot: Secondary | ICD-10-CM | POA: Diagnosis present

## 2024-08-16 LAB — COMPREHENSIVE METABOLIC PANEL WITH GFR
ALT: 14 U/L (ref 0–44)
AST: 16 U/L (ref 15–41)
Albumin: 1.6 g/dL — ABNORMAL LOW (ref 3.5–5.0)
Alkaline Phosphatase: 201 U/L — ABNORMAL HIGH (ref 38–126)
Anion gap: 11 (ref 5–15)
BUN: 11 mg/dL (ref 6–20)
CO2: 28 mmol/L (ref 22–32)
Calcium: 8.2 mg/dL — ABNORMAL LOW (ref 8.9–10.3)
Chloride: 91 mmol/L — ABNORMAL LOW (ref 98–111)
Creatinine, Ser: 1.27 mg/dL — ABNORMAL HIGH (ref 0.61–1.24)
GFR, Estimated: >60 mL/min (ref >60)
Glucose, Bld: 579 mg/dL (ref 70–99)
Potassium: 3.6 mmol/L (ref 3.5–5.1)
Sodium: 130 mmol/L — ABNORMAL LOW (ref 135–145)
Total Bilirubin: 0.7 mg/dL (ref 0.0–1.2)
Total Protein: 7.5 g/dL (ref 6.5–8.1)

## 2024-08-16 LAB — I-STAT CHEM 8, ED
BUN: 12 mg/dL (ref 6–20)
Calcium, Ion: 1.11 mmol/L — ABNORMAL LOW (ref 1.15–1.40)
Chloride: 94 mmol/L — ABNORMAL LOW (ref 98–111)
Creatinine, Ser: 1.2 mg/dL (ref 0.61–1.24)
Glucose, Bld: 573 mg/dL (ref 70–99)
HCT: 49 % (ref 39.0–52.0)
Hemoglobin: 16.7 g/dL (ref 13.0–17.0)
Potassium: 3.8 mmol/L (ref 3.5–5.1)
Sodium: 134 mmol/L — ABNORMAL LOW (ref 135–145)
TCO2: 29 mmol/L (ref 22–32)

## 2024-08-16 LAB — I-STAT VENOUS BLOOD GAS, ED
Acid-Base Excess: 5 mmol/L — ABNORMAL HIGH (ref 0.0–2.0)
Bicarbonate: 31.9 mmol/L — ABNORMAL HIGH (ref 20.0–28.0)
Calcium, Ion: 1.11 mmol/L — ABNORMAL LOW (ref 1.15–1.40)
HCT: 45 % (ref 39.0–52.0)
Hemoglobin: 15.3 g/dL (ref 13.0–17.0)
O2 Saturation: 54 %
Potassium: 3.6 mmol/L (ref 3.5–5.1)
Sodium: 134 mmol/L — ABNORMAL LOW (ref 135–145)
TCO2: 34 mmol/L — ABNORMAL HIGH (ref 22–32)
pCO2, Ven: 54.4 mmHg (ref 44–60)
pH, Ven: 7.376 (ref 7.25–7.43)
pO2, Ven: 30 mmHg — CL (ref 32–45)

## 2024-08-16 LAB — CBC
HCT: 45.8 % (ref 39.0–52.0)
Hemoglobin: 13.3 g/dL (ref 13.0–17.0)
MCH: 23.7 pg — ABNORMAL LOW (ref 26.0–34.0)
MCHC: 29 g/dL — ABNORMAL LOW (ref 30.0–36.0)
MCV: 81.5 fL (ref 80.0–100.0)
Platelets: 353 K/uL (ref 150–400)
RBC: 5.62 MIL/uL (ref 4.22–5.81)
RDW: 18.4 % — ABNORMAL HIGH (ref 11.5–15.5)
WBC: 10.7 K/uL — ABNORMAL HIGH (ref 4.0–10.5)
nRBC: 0 % (ref 0.0–0.2)

## 2024-08-16 LAB — BETA-HYDROXYBUTYRIC ACID: Beta-Hydroxybutyric Acid: 0.09 mmol/L (ref 0.05–0.27)

## 2024-08-16 MED ORDER — SODIUM CHLORIDE 0.9 % IV BOLUS
1000.0000 mL | Freq: Once | INTRAVENOUS | Status: AC
  Administered 2024-08-16: 1000 mL via INTRAVENOUS

## 2024-08-16 MED ORDER — INSULIN ASPART 100 UNIT/ML IJ SOLN
5.0000 [IU] | Freq: Once | INTRAMUSCULAR | Status: AC
  Administered 2024-08-16: 5 [IU] via SUBCUTANEOUS

## 2024-08-16 NOTE — ED Triage Notes (Signed)
 Pt coming via EMS from home for hyperglycemia (CBG - 600). Has not been on insulin  past 4-5 weeks after being discharged from SNF and not being able to get into his physician for prescription.  Pt also here for feeling sore on bottom of left foot, ongoing for past 2 months. Pt reports it has been oozing blood. Area is wrapped on arrival to ER.

## 2024-08-16 NOTE — ED Provider Notes (Signed)
 [place] [facility]GENCY DEPARTMENT AT [facility] Provider Note  CSN: [id number] Arrival date & time: 08/16/24 2200  Chief Complaint(s) Hyperglycemia and Open Wound  HPI Ryan [name] is a 58 y.o. 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 no recent sleep study   Subacute osteomyelitis of right tibia (HCC) 02/27/2023   Tobacco use disorder 03/19/2020   Uncontrolled type 2 diabetes mellitus with hyperglycemia (HCC) 01/01/2019   Uncontrolled type 2 diabetes mellitus with hyperglycemia,  without long-term current use of insulin  (HCC) 02/24/2023   Venous insufficiency of right leg 05/15/2020   Volume overload 09/12/2023   Wound infection after surgery 06/05/2021   Patient Active Problem List   Diagnosis Date Noted   Acute respiratory failure with hypoxia and hypercapnia (HCC) 01/11/2024   COPD (chronic obstructive pulmonary disease) (HCC) 01/11/2024   CHF (congestive heart failure) (HCC) 10/16/2023   Volume overload 09/12/2023   Arthritis    GERD (gastroesophageal reflux disease)    Sleep apnea    Chronic hypercapnic respiratory failure (HCC) 08/20/2023   Abscess 08/18/2023   Morbid obesity (HCC) 08/18/2023   PVD (peripheral vascular disease) 08/18/2023   Cellulitis of left lower extremity 08/16/2023   Pneumonia due to infectious agent 08/16/2023   Subacute osteomyelitis of right tibia (HCC) 02/27/2023   Dehiscence of amputation stump of right lower extremity (HCC) 02/27/2023   Uncontrolled type 2 diabetes mellitus with hyperglycemia, without long-term current use of insulin  (HCC) 02/24/2023   Left buttock abscess 02/24/2023   Open wound of right shoulder 02/24/2023   Chronic prescription opiate use - sees pain clinic in [place], [place]. 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 Authorizing Provider  albuterol  (VENTOLIN  HFA) 108 (90 Base) MCG/ACT inhaler Inhale 2 puffs into the lungs every 4 (four) hours as needed for wheezing or shortness of breath.    [provider]  ascorbic acid  (VITAMIN C ) 500 MG tablet Take 500 mg by mouth 2 (two) times daily.    [provider]  bisacodyl  (DULCOLAX) 5 MG EC tablet Take 5 mg by mouth daily as needed for moderate constipation.    [provider]  brompheniramine-pseudoephedrine-DM 30-2-10 MG/5ML syrup Take 10 mLs by mouth 4 (four) times daily as needed (cough).    [provider]  bumetanide  (BUMEX ) 1 MG tablet Take 2 mg two times daily for 5 days. Then switch to 1 mg two times daily 05/19/24   Madireddy, Alean SAUNDERS, MD  celecoxib (CELEBREX) 200 MG capsule Take 200 mg by mouth daily.    [provider]  collagenase  (SANTYL ) 250 UNIT/GM ointment Apply 1 Application topically daily. Apply to the affected area daily plus dry dressing 04/14/23   Harden Jerona GAILS, MD  docusate sodium  (COLACE) 100 MG capsule Take 100 mg by mouth daily.    [provider]  famotidine (PEPCID) 20 MG tablet Take 20 mg by mouth daily. 05/18/24   [provider]  fenofibrate (TRICOR) 48 MG tablet Take 48 mg by mouth daily.    [provider]  furosemide  (LASIX ) 40 MG tablet Take 40 mg by mouth daily. 03/04/24   [provider]  gabapentin  (NEURONTIN ) 800 MG tablet Take 800 mg by mouth 3 (three) times daily. 02/14/23   [provider]  Glucagon, rDNA, (GLUCAGON EMERGENCY) 1 MG KIT Inject 1 mg into the muscle as needed (BG less than 70). 02/18/24   [provider]  hydrALAZINE  (APRESOLINE ) 25 MG tablet Take 25 mg by mouth 3 (three) times daily.    [provider]  Insulin  Aspart FlexPen (NOVOLOG ) 100 UNIT/ML Inject 10 Units into the skin 4 (four) times daily -  before meals and at  bedtime. Hold for CBGs < 150 or if patient is not eating.    [provider]  insulin  glargine-yfgn (SEMGLEE ) 100 UNIT/ML Pen Inject 30 Units into the skin every 12 (twelve) hours. Hold if CBG is < 150    [provider]  ipratropium-albuterol  (DUONEB) 0.5-2.5 (3) MG/3ML SOLN Inhale 3 mLs into the lungs every 4 (four) hours as needed (shortness of breath).    [provider]  JARDIANCE 10 MG TABS tablet Take 10 mg by mouth daily.    [provider]  LANTUS  SOLOSTAR 100 UNIT/ML Solostar Pen Inject into the skin daily. 02/01/24   [provider]  melatonin 3 MG TABS tablet Take 6 mg by mouth at bedtime.    [provider]  metFORMIN  (GLUCOPHAGE ) 1000 MG tablet Take 1,000 mg by mouth 2 (two) times daily.    [provider]  metoprolol  tartrate (LOPRESSOR ) 25 MG tablet Take 12.5 mg by mouth daily.    [provider]  naloxone [facility]) nasal spray 4 mg/0.1 mL Place 1 spray into the nose once as needed (opioid overdose). 12/23/22   [provider]  oxyCODONE  (ROXICODONE ) 15 MG  immediate release tablet Take 15 mg by mouth every 4 (four) hours as needed for pain.    [provider]  pantoprazole  (PROTONIX ) 40 MG tablet Take 40 mg by mouth at bedtime. 03/07/23   [provider]  polyethylene glycol (MIRALAX  / GLYCOLAX ) 17 g packet Take 17 g by mouth daily as needed for mild constipation or moderate constipation.    [provider]  rOPINIRole (REQUIP) 0.5 MG tablet Take 0.5 mg by mouth 3 (three) times daily.    [provider]  TRELEGY ELLIPTA 100-62.5-25 MCG/ACT AEPB Inhale 1 puff into the lungs daily. 03/20/24   [provider]                                                                                                                                    Allergies Acetaminophen   Review of Systems Review of Systems As noted in HPI  Physical Exam Vital Signs  I have reviewed the  triage vital signs BP (!) 155/88   Pulse (!) 105   Temp 98.2 F (36.8 C)   Resp 16   Ht 6' 1 (1.854 m)   Wt 108.9 kg   SpO2 97%   BMI 31.66 kg/m  *** Physical Exam  ED Results and Treatments Labs (all labs ordered are listed, but only abnormal results are displayed) Labs Reviewed  CBC - Abnormal; Notable for the following components:      Result Value   WBC 10.7 (*)    MCH 23.7 (*)    MCHC 29.0 (*)    RDW 18.4 (*)    All other components within normal limits  COMPREHENSIVE METABOLIC PANEL WITH GFR - Abnormal; Notable for the following components:   Sodium 130 (*)    Chloride 91 (*)    Glucose, Bld 579 (*)    Creatinine, Ser 1.27 (*)    Calcium 8.2 (*)    Albumin 1.6 (*)    Alkaline Phosphatase 201 (*)    All other components within normal limits  I-STAT VENOUS BLOOD GAS, ED - Abnormal; Notable for the following components:   pO2, Ven 30 (*)    Bicarbonate 31.9 (*)    TCO2 34 (*)    Acid-Base Excess 5.0 (*)    Sodium 134 (*)    Calcium, Ion 1.11 (*)    All other components within normal limits  I-STAT CHEM 8, ED - Abnormal; Notable for the following components:   Sodium 134 (*)    Chloride 94 (*)    Glucose, Bld 573 (*)    Calcium, Ion 1.11 (*)    All other components within normal limits  BETA-HYDROXYBUTYRIC ACID  URINALYSIS, ROUTINE W REFLEX MICROSCOPIC  EKG  EKG Interpretation Date/Time:    Ventricular Rate:    PR Interval:    QRS Duration:    QT Interval:    QTC Calculation:   R Axis:      Text Interpretation:         Radiology No results found.  Medications Ordered in ED Medications  sodium chloride  0.9 % bolus 1,000 mL (0 mLs Intravenous Stopped 08/16/24 2351)  insulin  aspart (novoLOG ) injection 5 Units (5 Units Subcutaneous Given 08/16/24 2228)   Procedures Procedures  (including critical care time) Medical Decision  Making / ED Course   Medical Decision Making Amount and/or Complexity of Data Reviewed Labs: ordered.    ***    Final Clinical Impression(s) / ED Diagnoses Final diagnoses:  None    This chart was dictated using voice recognition software.  Despite best efforts to proofread,  errors can occur which can change the documentation meaning.

## 2024-08-16 NOTE — ED Triage Notes (Signed)
 Last vitals: 150/84, 94% RA, 114 HR, 97.54F, 18 resp

## 2024-08-17 ENCOUNTER — Inpatient Hospital Stay (HOSPITAL_COMMUNITY)

## 2024-08-17 ENCOUNTER — Emergency Department (HOSPITAL_COMMUNITY)

## 2024-08-17 DIAGNOSIS — Z89512 Acquired absence of left leg below knee: Secondary | ICD-10-CM | POA: Diagnosis not present

## 2024-08-17 DIAGNOSIS — I5032 Chronic diastolic (congestive) heart failure: Secondary | ICD-10-CM | POA: Diagnosis present

## 2024-08-17 DIAGNOSIS — R7881 Bacteremia: Secondary | ICD-10-CM | POA: Diagnosis not present

## 2024-08-17 DIAGNOSIS — Z89511 Acquired absence of right leg below knee: Secondary | ICD-10-CM | POA: Diagnosis not present

## 2024-08-17 DIAGNOSIS — M869 Osteomyelitis, unspecified: Secondary | ICD-10-CM | POA: Diagnosis not present

## 2024-08-17 DIAGNOSIS — L539 Erythematous condition, unspecified: Secondary | ICD-10-CM | POA: Diagnosis not present

## 2024-08-17 DIAGNOSIS — M65872 Other synovitis and tenosynovitis, left ankle and foot: Secondary | ICD-10-CM | POA: Diagnosis not present

## 2024-08-17 DIAGNOSIS — F17200 Nicotine dependence, unspecified, uncomplicated: Secondary | ICD-10-CM | POA: Diagnosis not present

## 2024-08-17 DIAGNOSIS — Z79891 Long term (current) use of opiate analgesic: Secondary | ICD-10-CM | POA: Diagnosis not present

## 2024-08-17 DIAGNOSIS — K219 Gastro-esophageal reflux disease without esophagitis: Secondary | ICD-10-CM | POA: Diagnosis not present

## 2024-08-17 DIAGNOSIS — T8781 Dehiscence of amputation stump: Secondary | ICD-10-CM | POA: Diagnosis present

## 2024-08-17 DIAGNOSIS — G4733 Obstructive sleep apnea (adult) (pediatric): Secondary | ICD-10-CM | POA: Diagnosis not present

## 2024-08-17 DIAGNOSIS — Z66 Do not resuscitate: Secondary | ICD-10-CM | POA: Diagnosis not present

## 2024-08-17 DIAGNOSIS — I739 Peripheral vascular disease, unspecified: Secondary | ICD-10-CM | POA: Diagnosis not present

## 2024-08-17 DIAGNOSIS — M868X6 Other osteomyelitis, lower leg: Secondary | ICD-10-CM | POA: Diagnosis not present

## 2024-08-17 DIAGNOSIS — L03116 Cellulitis of left lower limb: Secondary | ICD-10-CM | POA: Diagnosis present

## 2024-08-17 DIAGNOSIS — M009 Pyogenic arthritis, unspecified: Secondary | ICD-10-CM | POA: Diagnosis present

## 2024-08-17 DIAGNOSIS — L089 Local infection of the skin and subcutaneous tissue, unspecified: Secondary | ICD-10-CM | POA: Diagnosis not present

## 2024-08-17 DIAGNOSIS — I70262 Atherosclerosis of native arteries of extremities with gangrene, left leg: Secondary | ICD-10-CM | POA: Diagnosis not present

## 2024-08-17 DIAGNOSIS — A480 Gas gangrene: Secondary | ICD-10-CM | POA: Diagnosis not present

## 2024-08-17 DIAGNOSIS — Z515 Encounter for palliative care: Secondary | ICD-10-CM | POA: Diagnosis not present

## 2024-08-17 DIAGNOSIS — M86272 Subacute osteomyelitis, left ankle and foot: Secondary | ICD-10-CM

## 2024-08-17 DIAGNOSIS — L97529 Non-pressure chronic ulcer of other part of left foot with unspecified severity: Secondary | ICD-10-CM | POA: Diagnosis not present

## 2024-08-17 DIAGNOSIS — M7989 Other specified soft tissue disorders: Secondary | ICD-10-CM | POA: Diagnosis not present

## 2024-08-17 DIAGNOSIS — L02612 Cutaneous abscess of left foot: Secondary | ICD-10-CM | POA: Diagnosis not present

## 2024-08-17 DIAGNOSIS — E11621 Type 2 diabetes mellitus with foot ulcer: Secondary | ICD-10-CM | POA: Diagnosis not present

## 2024-08-17 DIAGNOSIS — E11628 Type 2 diabetes mellitus with other skin complications: Secondary | ICD-10-CM | POA: Diagnosis not present

## 2024-08-17 DIAGNOSIS — F1110 Opioid abuse, uncomplicated: Secondary | ICD-10-CM | POA: Diagnosis present

## 2024-08-17 DIAGNOSIS — Y838 Other surgical procedures as the cause of abnormal reaction of the patient, or of later complication, without mention of misadventure at the time of the procedure: Secondary | ICD-10-CM | POA: Diagnosis not present

## 2024-08-17 DIAGNOSIS — Y835 Amputation of limb(s) as the cause of abnormal reaction of the patient, or of later complication, without mention of misadventure at the time of the procedure: Secondary | ICD-10-CM | POA: Diagnosis not present

## 2024-08-17 DIAGNOSIS — Z720 Tobacco use: Secondary | ICD-10-CM | POA: Diagnosis not present

## 2024-08-17 DIAGNOSIS — M00872 Arthritis due to other bacteria, left ankle and foot: Secondary | ICD-10-CM | POA: Diagnosis not present

## 2024-08-17 DIAGNOSIS — J80 Acute respiratory distress syndrome: Secondary | ICD-10-CM | POA: Diagnosis present

## 2024-08-17 DIAGNOSIS — M86671 Other chronic osteomyelitis, right ankle and foot: Secondary | ICD-10-CM | POA: Diagnosis present

## 2024-08-17 DIAGNOSIS — E1165 Type 2 diabetes mellitus with hyperglycemia: Secondary | ICD-10-CM | POA: Diagnosis not present

## 2024-08-17 DIAGNOSIS — R6521 Severe sepsis with septic shock: Secondary | ICD-10-CM | POA: Diagnosis not present

## 2024-08-17 DIAGNOSIS — E669 Obesity, unspecified: Secondary | ICD-10-CM | POA: Diagnosis not present

## 2024-08-17 DIAGNOSIS — M79673 Pain in unspecified foot: Secondary | ICD-10-CM

## 2024-08-17 DIAGNOSIS — T8744 Infection of amputation stump, left lower extremity: Secondary | ICD-10-CM | POA: Diagnosis not present

## 2024-08-17 DIAGNOSIS — I97121 Postprocedural cardiac arrest following other surgery: Secondary | ICD-10-CM | POA: Diagnosis not present

## 2024-08-17 DIAGNOSIS — G931 Anoxic brain damage, not elsewhere classified: Secondary | ICD-10-CM | POA: Diagnosis not present

## 2024-08-17 DIAGNOSIS — I110 Hypertensive heart disease with heart failure: Secondary | ICD-10-CM | POA: Diagnosis present

## 2024-08-17 DIAGNOSIS — B9562 Methicillin resistant Staphylococcus aureus infection as the cause of diseases classified elsewhere: Secondary | ICD-10-CM | POA: Diagnosis not present

## 2024-08-17 DIAGNOSIS — E871 Hypo-osmolality and hyponatremia: Secondary | ICD-10-CM | POA: Diagnosis present

## 2024-08-17 DIAGNOSIS — F17210 Nicotine dependence, cigarettes, uncomplicated: Secondary | ICD-10-CM | POA: Diagnosis not present

## 2024-08-17 DIAGNOSIS — I469 Cardiac arrest, cause unspecified: Secondary | ICD-10-CM | POA: Diagnosis not present

## 2024-08-17 DIAGNOSIS — E872 Acidosis, unspecified: Secondary | ICD-10-CM | POA: Diagnosis present

## 2024-08-17 DIAGNOSIS — I272 Pulmonary hypertension, unspecified: Secondary | ICD-10-CM | POA: Diagnosis present

## 2024-08-17 DIAGNOSIS — M86172 Other acute osteomyelitis, left ankle and foot: Secondary | ICD-10-CM | POA: Diagnosis not present

## 2024-08-17 DIAGNOSIS — A4102 Sepsis due to Methicillin resistant Staphylococcus aureus: Secondary | ICD-10-CM | POA: Diagnosis present

## 2024-08-17 DIAGNOSIS — I10 Essential (primary) hypertension: Secondary | ICD-10-CM | POA: Diagnosis not present

## 2024-08-17 DIAGNOSIS — I509 Heart failure, unspecified: Secondary | ICD-10-CM | POA: Diagnosis not present

## 2024-08-17 DIAGNOSIS — E1152 Type 2 diabetes mellitus with diabetic peripheral angiopathy with gangrene: Secondary | ICD-10-CM | POA: Diagnosis not present

## 2024-08-17 DIAGNOSIS — J449 Chronic obstructive pulmonary disease, unspecified: Secondary | ICD-10-CM | POA: Diagnosis present

## 2024-08-17 DIAGNOSIS — Z6834 Body mass index (BMI) 34.0-34.9, adult: Secondary | ICD-10-CM | POA: Diagnosis not present

## 2024-08-17 DIAGNOSIS — I361 Nonrheumatic tricuspid (valve) insufficiency: Secondary | ICD-10-CM | POA: Diagnosis not present

## 2024-08-17 DIAGNOSIS — L97829 Non-pressure chronic ulcer of other part of left lower leg with unspecified severity: Secondary | ICD-10-CM | POA: Diagnosis present

## 2024-08-17 DIAGNOSIS — Z8614 Personal history of Methicillin resistant Staphylococcus aureus infection: Secondary | ICD-10-CM | POA: Diagnosis not present

## 2024-08-17 DIAGNOSIS — E1169 Type 2 diabetes mellitus with other specified complication: Secondary | ICD-10-CM | POA: Diagnosis not present

## 2024-08-17 DIAGNOSIS — E1142 Type 2 diabetes mellitus with diabetic polyneuropathy: Secondary | ICD-10-CM | POA: Diagnosis present

## 2024-08-17 LAB — COMPREHENSIVE METABOLIC PANEL WITH GFR
ALT: 11 U/L (ref 0–44)
AST: 12 U/L — ABNORMAL LOW (ref 15–41)
Albumin: <1.5 g/dL — ABNORMAL LOW (ref 3.5–5.0)
Alkaline Phosphatase: 170 U/L — ABNORMAL HIGH (ref 38–126)
Anion gap: 9 (ref 5–15)
BUN: 9 mg/dL (ref 6–20)
CO2: 27 mmol/L (ref 22–32)
Calcium: 7.9 mg/dL — ABNORMAL LOW (ref 8.9–10.3)
Chloride: 97 mmol/L — ABNORMAL LOW (ref 98–111)
Creatinine, Ser: 1.08 mg/dL (ref 0.61–1.24)
GFR, Estimated: >60 mL/min (ref >60)
Glucose, Bld: 352 mg/dL — ABNORMAL HIGH (ref 70–99)
Potassium: 3.4 mmol/L — ABNORMAL LOW (ref 3.5–5.1)
Sodium: 133 mmol/L — ABNORMAL LOW (ref 135–145)
Total Bilirubin: 0.6 mg/dL (ref 0.0–1.2)
Total Protein: 6.8 g/dL (ref 6.5–8.1)

## 2024-08-17 LAB — MAGNESIUM: Magnesium: 1.6 mg/dL — ABNORMAL LOW (ref 1.7–2.4)

## 2024-08-17 LAB — TYPE AND SCREEN
ABO/RH(D): A POS
Antibody Screen: NEGATIVE

## 2024-08-17 LAB — BLOOD CULTURE ID PANEL (REFLEXED) - BCID2

## 2024-08-17 LAB — URINALYSIS, ROUTINE W REFLEX MICROSCOPIC
Bacteria, UA: NONE SEEN
Bilirubin Urine: NEGATIVE
Glucose, UA: >=500 mg/dL — AB
Ketones, ur: NEGATIVE mg/dL
Leukocytes,Ua: NEGATIVE
Nitrite: NEGATIVE
Protein, ur: >=300 mg/dL — AB
Specific Gravity, Urine: 1.024 (ref 1.005–1.030)
pH: 7 (ref 5.0–8.0)

## 2024-08-17 LAB — CBG MONITORING, ED
Glucose-Capillary: 183 mg/dL — ABNORMAL HIGH (ref 70–99)
Glucose-Capillary: 200 mg/dL — ABNORMAL HIGH (ref 70–99)
Glucose-Capillary: 215 mg/dL — ABNORMAL HIGH (ref 70–99)
Glucose-Capillary: 272 mg/dL — ABNORMAL HIGH (ref 70–99)
Glucose-Capillary: 281 mg/dL — ABNORMAL HIGH (ref 70–99)
Glucose-Capillary: 357 mg/dL — ABNORMAL HIGH (ref 70–99)
Glucose-Capillary: 362 mg/dL — ABNORMAL HIGH (ref 70–99)
Glucose-Capillary: 483 mg/dL — ABNORMAL HIGH (ref 70–99)

## 2024-08-17 LAB — CBC WITH DIFFERENTIAL/PLATELET
Abs Immature Granulocytes: 0.06 K/uL (ref 0.00–0.07)
Basophils Absolute: 0.1 K/uL (ref 0.0–0.1)
Basophils Relative: 1 %
Eosinophils Absolute: 0.1 K/uL (ref 0.0–0.5)
Eosinophils Relative: 1 %
HCT: 39.1 % (ref 39.0–52.0)
Hemoglobin: 11.6 g/dL — ABNORMAL LOW (ref 13.0–17.0)
Immature Granulocytes: 1 %
Lymphocytes Relative: 11 %
Lymphs Abs: 1.1 K/uL (ref 0.7–4.0)
MCH: 24 pg — ABNORMAL LOW (ref 26.0–34.0)
MCHC: 29.7 g/dL — ABNORMAL LOW (ref 30.0–36.0)
MCV: 80.8 fL (ref 80.0–100.0)
Monocytes Absolute: 1 K/uL (ref 0.1–1.0)
Monocytes Relative: 10 %
Neutro Abs: 7.8 K/uL — ABNORMAL HIGH (ref 1.7–7.7)
Neutrophils Relative %: 76 %
Platelets: 333 K/uL (ref 150–400)
RBC: 4.84 MIL/uL (ref 4.22–5.81)
RDW: 17.9 % — ABNORMAL HIGH (ref 11.5–15.5)
WBC: 10 K/uL (ref 4.0–10.5)
nRBC: 0 % (ref 0.0–0.2)

## 2024-08-17 LAB — VAS US ABI WITH/WO TBI: Left ABI: 0.98

## 2024-08-17 LAB — PROTIME-INR
INR: 1.3 — ABNORMAL HIGH (ref 0.8–1.2)
Prothrombin Time: 16.4 s — ABNORMAL HIGH (ref 11.4–15.2)

## 2024-08-17 LAB — HEMOGLOBIN A1C
Hgb A1c MFr Bld: 13.2 % — ABNORMAL HIGH (ref 4.8–5.6)
Mean Plasma Glucose: 332.14 mg/dL

## 2024-08-17 LAB — C-REACTIVE PROTEIN: CRP: 11 mg/dL — ABNORMAL HIGH (ref <1.0)

## 2024-08-17 LAB — SEDIMENTATION RATE: Sed Rate: 55 mm/h — ABNORMAL HIGH (ref 0–16)

## 2024-08-17 MED ORDER — ACETAMINOPHEN 325 MG PO TABS
650.0000 mg | ORAL_TABLET | Freq: Four times a day (QID) | ORAL | Status: DC | PRN
  Administered 2024-09-17: 650 mg via ORAL
  Filled 2024-08-17 (×2): qty 2

## 2024-08-17 MED ORDER — VANCOMYCIN HCL 1250 MG/250ML IV SOLN
1250.0000 mg | Freq: Two times a day (BID) | INTRAVENOUS | Status: DC
  Filled 2024-08-17: qty 250

## 2024-08-17 MED ORDER — HYDROMORPHONE HCL 1 MG/ML IJ SOLN
1.0000 mg | Freq: Once | INTRAMUSCULAR | Status: AC
  Administered 2024-08-17: 1 mg via INTRAVENOUS
  Filled 2024-08-17: qty 1

## 2024-08-17 MED ORDER — HYDROMORPHONE HCL 1 MG/ML IJ SOLN
0.5000 mg | INTRAMUSCULAR | Status: DC | PRN
  Administered 2024-08-17 – 2024-08-25 (×47): 0.5 mg via INTRAVENOUS
  Filled 2024-08-17 (×2): qty 0.5
  Filled 2024-08-17: qty 1
  Filled 2024-08-17 (×3): qty 0.5
  Filled 2024-08-17 (×3): qty 1
  Filled 2024-08-17 (×2): qty 0.5
  Filled 2024-08-17: qty 1
  Filled 2024-08-17 (×11): qty 0.5
  Filled 2024-08-17: qty 1
  Filled 2024-08-17 (×3): qty 0.5
  Filled 2024-08-17: qty 1
  Filled 2024-08-17 (×2): qty 0.5
  Filled 2024-08-17: qty 1
  Filled 2024-08-17: qty 0.5
  Filled 2024-08-17 (×2): qty 1
  Filled 2024-08-17 (×8): qty 0.5
  Filled 2024-08-17: qty 1
  Filled 2024-08-17 (×5): qty 0.5
  Filled 2024-08-17: qty 1

## 2024-08-17 MED ORDER — PIPERACILLIN-TAZOBACTAM 3.375 G IVPB 30 MIN
3.3750 g | Freq: Once | INTRAVENOUS | Status: AC
  Administered 2024-08-17: 3.375 g via INTRAVENOUS
  Filled 2024-08-17: qty 50

## 2024-08-17 MED ORDER — HYDROMORPHONE HCL 1 MG/ML IJ SOLN
0.5000 mg | INTRAMUSCULAR | Status: DC | PRN
Start: 2024-08-17 — End: 2024-08-17

## 2024-08-17 MED ORDER — LINEZOLID 600 MG/300ML IV SOLN
600.0000 mg | Freq: Once | INTRAVENOUS | Status: AC
  Administered 2024-08-17: 600 mg via INTRAVENOUS
  Filled 2024-08-17: qty 300

## 2024-08-17 MED ORDER — INSULIN ASPART 100 UNIT/ML IJ SOLN
0.0000 [IU] | INTRAMUSCULAR | Status: DC
  Administered 2024-08-17: 2 [IU] via SUBCUTANEOUS
  Administered 2024-08-17: 5 [IU] via SUBCUTANEOUS
  Administered 2024-08-17: 3 [IU] via SUBCUTANEOUS
  Administered 2024-08-17: 2 [IU] via SUBCUTANEOUS
  Administered 2024-08-17: 9 [IU] via SUBCUTANEOUS
  Administered 2024-08-18: 5 [IU] via SUBCUTANEOUS
  Administered 2024-08-18: 2 [IU] via SUBCUTANEOUS
  Administered 2024-08-18: 5 [IU] via SUBCUTANEOUS
  Administered 2024-08-18: 2 [IU] via SUBCUTANEOUS
  Administered 2024-08-18 (×2): 3 [IU] via SUBCUTANEOUS
  Administered 2024-08-19: 2 [IU] via SUBCUTANEOUS
  Administered 2024-08-19: 3 [IU] via SUBCUTANEOUS
  Administered 2024-08-19: 5 [IU] via SUBCUTANEOUS

## 2024-08-17 MED ORDER — VANCOMYCIN HCL 2000 MG/400ML IV SOLN
2000.0000 mg | Freq: Once | INTRAVENOUS | Status: AC
  Administered 2024-08-17: 2000 mg via INTRAVENOUS
  Filled 2024-08-17: qty 400

## 2024-08-17 MED ORDER — MELATONIN 3 MG PO TABS
3.0000 mg | ORAL_TABLET | Freq: Every evening | ORAL | Status: DC | PRN
  Administered 2024-09-02 – 2024-09-08 (×7): 3 mg via ORAL
  Filled 2024-08-17 (×8): qty 1

## 2024-08-17 MED ORDER — GADOBUTROL 1 MMOL/ML IV SOLN
10.0000 mL | Freq: Once | INTRAVENOUS | Status: AC | PRN
  Administered 2024-08-17: 10 mL via INTRAVENOUS

## 2024-08-17 MED ORDER — SODIUM CHLORIDE 0.9 % IV BOLUS
1000.0000 mL | Freq: Once | INTRAVENOUS | Status: AC
  Administered 2024-08-17: 1000 mL via INTRAVENOUS

## 2024-08-17 MED ORDER — POTASSIUM CHLORIDE CRYS ER 20 MEQ PO TBCR
30.0000 meq | EXTENDED_RELEASE_TABLET | ORAL | Status: AC
  Administered 2024-08-17 (×2): 30 meq via ORAL
  Filled 2024-08-17 (×2): qty 1

## 2024-08-17 MED ORDER — MAGNESIUM SULFATE 2 GM/50ML IV SOLN
2.0000 g | Freq: Once | INTRAVENOUS | Status: AC
  Administered 2024-08-17: 2 g via INTRAVENOUS
  Filled 2024-08-17: qty 50

## 2024-08-17 MED ORDER — ONDANSETRON HCL 4 MG/2ML IJ SOLN
4.0000 mg | Freq: Four times a day (QID) | INTRAMUSCULAR | Status: DC | PRN
  Administered 2024-09-16: 4 mg via INTRAVENOUS

## 2024-08-17 MED ORDER — OXYCODONE HCL 5 MG PO TABS
15.0000 mg | ORAL_TABLET | ORAL | Status: DC | PRN
  Administered 2024-08-17 – 2024-09-02 (×72): 15 mg via ORAL
  Filled 2024-08-17 (×74): qty 3

## 2024-08-17 MED ORDER — LINEZOLID 600 MG/300ML IV SOLN
600.0000 mg | Freq: Two times a day (BID) | INTRAVENOUS | Status: DC
  Administered 2024-08-17: 600 mg via INTRAVENOUS
  Filled 2024-08-17 (×2): qty 300

## 2024-08-17 MED ORDER — ACETAMINOPHEN 650 MG RE SUPP: 650.0000 mg | Freq: Four times a day (QID) | RECTAL | Status: DC | PRN

## 2024-08-17 MED ORDER — PIPERACILLIN-TAZOBACTAM 3.375 G IVPB
3.3750 g | Freq: Three times a day (TID) | INTRAVENOUS | Status: DC
  Administered 2024-08-17 – 2024-08-20 (×9): 3.375 g via INTRAVENOUS
  Filled 2024-08-17 (×9): qty 50

## 2024-08-17 MED ORDER — SODIUM CHLORIDE 0.9 % IV SOLN: INTRAVENOUS | Status: AC

## 2024-08-17 NOTE — ED Notes (Signed)
 Pt transported to MRI

## 2024-08-17 NOTE — Assessment & Plan Note (Signed)
 2D ECHO 05/2024 w/ EF 50-55%  Appears euvolemic at present  Weight today 109 kg  Monitor volume status with treatment
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
## 2024-08-17 NOTE — Assessment & Plan Note (Signed)
 PPI

## 2024-08-17 NOTE — Inpatient Diabetes Management (Addendum)
 Inpatient Diabetes Program Recommendations  AACE/ADA: New Consensus Statement on Inpatient Glycemic Control (2015)  Target Ranges:  Prepandial:   less than 140 mg/dL      Peak postprandial:   less than 180 mg/dL (1-2 hours)      Critically ill patients:  140 - 180 mg/dL   Lab Results  Component Value Date   GLUCAP 183 (H) 08/17/2024   HGBA1C 13.2 (H) 08/16/2024    Review of Glycemic Control  Latest Reference Range & Units 08/17/24 00:33 08/17/24 01:52 08/17/24 03:51 08/17/24 07:43  Glucose-Capillary 70 - 99 mg/dL 516 (H) 642 (H) 637 (H) 183 (H)   Diabetes history: DM 2 Outpatient Diabetes medications: no insulin  the last 4-5 weeks, Novolog  10 units tid, Semglee  30 units bid, Jardiance 10 mg Daily, Metformin  1000 mg bid Current orders for Inpatient glycemic control:  Novolog  0-9 units Q4  A1c 13.2% on 10/7 [facility], was noted pt was noncompliant with some appointments and medication, wound changes, and elevating extremity.  Inpatient Diabetes Program Recommendations:    -  Start Lantus  10 units bid  Will see pt.  Addendum 1:45 pm:  Spoke with pt at bedside. Pt reports being without insulin  for 4-5 weeks and did not get in touch with PCP for insulin  because it had been awhile since he had been and they dropped him he mentioned maybe they will accept him back. Pt reports while at rehab they maybe changed his dressings maybe every 5-8 days. Discussed current A1c level of 13.2% on 10/7. Reviewed glucose and A1c goals. Pt will need refills on all medication and self management equipment at time of discharge.  Thanks,  Clotilda Bull RN, MSN, BC-ADM Inpatient Diabetes Coordinator Team Pager [phone] (8a-5p)

## 2024-08-17 NOTE — Progress Notes (Signed)
 PHARMACY - PHYSICIAN COMMUNICATION CRITICAL VALUE ALERT - BLOOD CULTURE IDENTIFICATION (BCID)   Assessment:  Ryan [name] is an 58 y.o. male who presented to [facility] on 08/16/2024 with a chief complaint of ulceration and cellulitis of L foot- Dr. Harden recommending BKA, pt already s/p transtibial amp revision on right side- pt does not wish to proceed with BKA at this time. Patient with long-standing history of uncontrolled diabetes.   10/8 Bcx: 1/4 GPCs in clusters, BCID with Staphylococcus aureus with mecA detected (MRSA)   Name of physician (or Provider) Contacted: Dr. Keturah   Current antibiotics: Linezolid/Zosyn   Changes to prescribed antibiotics recommended: Stop linezolid, start vancomycin  2000 mg IV x1, followed by 1,250 mg IV Q12H (eAUC 430, Vd used 0.72)  Continue Zosyn for now- ID to be automatically consulted  Recommendations accepted by provider  Results for orders placed or performed during the hospital encounter of 08/16/24  Blood Culture ID Panel (Reflexed) (Collected: 08/17/2024  1:05 AM)  Result Value Ref Range   Enterococcus faecalis NOT DETECTED NOT DETECTED   Enterococcus Faecium NOT DETECTED NOT DETECTED   Listeria monocytogenes NOT DETECTED NOT DETECTED   Staphylococcus species DETECTED (A) NOT DETECTED   Staphylococcus aureus (BCID) DETECTED (A) NOT DETECTED   Staphylococcus epidermidis NOT DETECTED NOT DETECTED   Staphylococcus lugdunensis NOT DETECTED NOT DETECTED   Streptococcus species NOT DETECTED NOT DETECTED   Streptococcus agalactiae NOT DETECTED NOT DETECTED   Streptococcus pneumoniae NOT DETECTED NOT DETECTED   Streptococcus pyogenes NOT DETECTED NOT DETECTED   A.calcoaceticus-baumannii NOT DETECTED NOT DETECTED   Bacteroides fragilis NOT DETECTED NOT DETECTED   Enterobacterales NOT DETECTED NOT DETECTED   Enterobacter cloacae complex NOT DETECTED NOT DETECTED   Escherichia coli NOT DETECTED NOT DETECTED   Klebsiella aerogenes NOT DETECTED  NOT DETECTED   Klebsiella oxytoca NOT DETECTED NOT DETECTED   Klebsiella pneumoniae NOT DETECTED NOT DETECTED   Proteus species NOT DETECTED NOT DETECTED   Salmonella species NOT DETECTED NOT DETECTED   Serratia marcescens NOT DETECTED NOT DETECTED   Haemophilus influenzae NOT DETECTED NOT DETECTED   Neisseria meningitidis NOT DETECTED NOT DETECTED   Pseudomonas aeruginosa NOT DETECTED NOT DETECTED   Stenotrophomonas maltophilia NOT DETECTED NOT DETECTED   Candida albicans NOT DETECTED NOT DETECTED   Candida auris NOT DETECTED NOT DETECTED   Candida glabrata NOT DETECTED NOT DETECTED   Candida krusei NOT DETECTED NOT DETECTED   Candida parapsilosis NOT DETECTED NOT DETECTED   Candida tropicalis NOT DETECTED NOT DETECTED   Cryptococcus neoformans/gattii NOT DETECTED NOT DETECTED   Meth resistant mecA/C and MREJ DETECTED (A) NOT DETECTED    Massie Fila, PharmD Clinical Pharmacist  08/17/2024 7:18 PM

## 2024-08-17 NOTE — Assessment & Plan Note (Signed)
 Stable from a resp standpoint  Cont home inhalers
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
## 2024-08-17 NOTE — Progress Notes (Signed)
 ABI has been completed.   Results can be found under chart review under CV PROC. 08/17/2024 1:36 PM Rayola Everhart RVT, RDMS

## 2024-08-17 NOTE — ED Notes (Signed)
 Called kitchen for meal tray

## 2024-08-17 NOTE — Assessment & Plan Note (Signed)
 Previous R BKA in setting of PVD, tobacco abuse and poorly controlled t2dm

## 2024-08-17 NOTE — ED Notes (Signed)
 CCMD called for cardiac monitoring.

## 2024-08-17 NOTE — Assessment & Plan Note (Signed)
 Diabetic foot infection  Worsening L foot purulent drainage, swelling and pain x 2 months  L foot plain films concerning for osteomyelitis  Blood cultures drawn  IV zyvox and zosyn for infectious coverage  ESR and CRP  MRI foot to better assess  ABIs  Consult podiatry vs. Ortho   Follow up recommendations

## 2024-08-17 NOTE — Assessment & Plan Note (Signed)
 On oxycodone  15mg  q4 hours prn  Will continue in setting of active diabetic foot infection

## 2024-08-17 NOTE — Assessment & Plan Note (Signed)
1 PPD smoker  Discussed cessation at length  Nicotine patch

## 2024-08-17 NOTE — Progress Notes (Signed)
  Carryover admission to the Day Admitter.  I discussed this case with the EDP, Dr. Trine.  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 Harden, who is being admitted with infected diabetic left foot wound.  He has noted recent progression of left sided diabetic foot wound, more recently with purulent drainage.  He presented to wound care clinic through [facility] earlier today, and following their evaluation, wound care clinic recommended that the patient present to the emergency room for further evaluation and management of infected diabetic foot wound.  Most recent hemoglobin A1c was greater than 15.5% in April 2024.   Initial CBG in the ED today was around 500, which is improving with IV fluids and subcutaneous NovoLog , with most recent CBG down to 357.  Thus far, patient has received a total of 2 L of normal saline in the ED. No evidence of anion gap metabolic acidosis to suggest dka.   Plain films of the left foot performed in the ED today are concerning for underlying osteomyelitis .   Blood cultures x 2 were collected in the ED followed by initiation of linezolid and Zosyn.   I have placed an order for inpatient admission for further evaluation management of the above.  I have placed some additional preliminary admit orders via the adult multi-morbid admission order set. I have also ordered continuation of existing orders for linezolid and Zosyn.  Admit the patient n.p.o., ordered prn Dilaudid , INR, type and screen, ESR, CRP as well as additional morning labs that include CMP, CBC, and magnesium  level.  Also ordered normal saline at 100 cc/h x 12 additional hours, hemoglobin A1c level, and every 4 hours CBG monitoring with low-dose sliding scale insulin .    Eva Pore, DO Hospitalist

## 2024-08-17 NOTE — Assessment & Plan Note (Signed)
 Blood sugars 500s on presentation without overt DKA or HHS  BHB WNL  A1c 13.2  Start on long acting insulin  and SSI  Diabetic education  Discussed importance of blood sugar management  Follow

## 2024-08-18 DIAGNOSIS — A480 Gas gangrene: Secondary | ICD-10-CM

## 2024-08-18 DIAGNOSIS — M65872 Other synovitis and tenosynovitis, left ankle and foot: Secondary | ICD-10-CM

## 2024-08-18 DIAGNOSIS — E669 Obesity, unspecified: Secondary | ICD-10-CM

## 2024-08-18 DIAGNOSIS — M00872 Arthritis due to other bacteria, left ankle and foot: Secondary | ICD-10-CM

## 2024-08-18 DIAGNOSIS — L03116 Cellulitis of left lower limb: Secondary | ICD-10-CM | POA: Diagnosis not present

## 2024-08-18 DIAGNOSIS — L02612 Cutaneous abscess of left foot: Secondary | ICD-10-CM

## 2024-08-18 DIAGNOSIS — A4102 Sepsis due to Methicillin resistant Staphylococcus aureus: Secondary | ICD-10-CM

## 2024-08-18 DIAGNOSIS — E11628 Type 2 diabetes mellitus with other skin complications: Secondary | ICD-10-CM

## 2024-08-18 LAB — CBG MONITORING, ED
Glucose-Capillary: 169 mg/dL — ABNORMAL HIGH (ref 70–99)
Glucose-Capillary: 193 mg/dL — ABNORMAL HIGH (ref 70–99)
Glucose-Capillary: 201 mg/dL — ABNORMAL HIGH (ref 70–99)
Glucose-Capillary: 204 mg/dL — ABNORMAL HIGH (ref 70–99)
Glucose-Capillary: 219 mg/dL — ABNORMAL HIGH (ref 70–99)

## 2024-08-18 LAB — BASIC METABOLIC PANEL WITH GFR
Anion gap: 10 (ref 5–15)
BUN: 10 mg/dL (ref 6–20)
CO2: 26 mmol/L (ref 22–32)
Calcium: 8.3 mg/dL — ABNORMAL LOW (ref 8.9–10.3)
Chloride: 97 mmol/L — ABNORMAL LOW (ref 98–111)
Creatinine, Ser: 1.09 mg/dL (ref 0.61–1.24)
GFR, Estimated: >60 mL/min (ref >60)
Glucose, Bld: 174 mg/dL — ABNORMAL HIGH (ref 70–99)
Potassium: 3.8 mmol/L (ref 3.5–5.1)
Sodium: 133 mmol/L — ABNORMAL LOW (ref 135–145)

## 2024-08-18 LAB — CBC
HCT: 42.9 % (ref 39.0–52.0)
Hemoglobin: 12.6 g/dL — ABNORMAL LOW (ref 13.0–17.0)
MCH: 24.1 pg — ABNORMAL LOW (ref 26.0–34.0)
MCHC: 29.4 g/dL — ABNORMAL LOW (ref 30.0–36.0)
MCV: 82.2 fL (ref 80.0–100.0)
Platelets: 348 K/uL (ref 150–400)
RBC: 5.22 MIL/uL (ref 4.22–5.81)
RDW: 18.2 % — ABNORMAL HIGH (ref 11.5–15.5)
WBC: 9.3 K/uL (ref 4.0–10.5)
nRBC: 0 % (ref 0.0–0.2)

## 2024-08-18 LAB — GLUCOSE, CAPILLARY
Glucose-Capillary: 276 mg/dL — ABNORMAL HIGH (ref 70–99)
Glucose-Capillary: 287 mg/dL — ABNORMAL HIGH (ref 70–99)

## 2024-08-18 LAB — MAGNESIUM: Magnesium: 1.7 mg/dL (ref 1.7–2.4)

## 2024-08-18 MED ORDER — DAPTOMYCIN-SODIUM CHLORIDE 700-0.9 MG/100ML-% IV SOLN
6.0000 mg/kg | Freq: Every day | INTRAVENOUS | Status: DC
  Filled 2024-08-18: qty 100

## 2024-08-18 MED ORDER — INSULIN GLARGINE 100 UNIT/ML ~~LOC~~ SOLN
10.0000 [IU] | Freq: Every day | SUBCUTANEOUS | Status: DC
  Filled 2024-08-18: qty 0.1

## 2024-08-18 MED ORDER — VANCOMYCIN HCL IN DEXTROSE 1-5 GM/200ML-% IV SOLN
1000.0000 mg | Freq: Two times a day (BID) | INTRAVENOUS | Status: DC
  Administered 2024-08-18: 1000 mg via INTRAVENOUS
  Filled 2024-08-18: qty 200

## 2024-08-18 MED ORDER — DAPTOMYCIN-SODIUM CHLORIDE 700-0.9 MG/100ML-% IV SOLN
8.0000 mg/kg | Freq: Every day | INTRAVENOUS | Status: DC
  Administered 2024-08-18 – 2024-09-01 (×14): 700 mg via INTRAVENOUS
  Filled 2024-08-18 (×15): qty 100

## 2024-08-18 MED ORDER — INSULIN GLARGINE 100 UNIT/ML ~~LOC~~ SOLN
10.0000 [IU] | Freq: Every day | SUBCUTANEOUS | Status: DC
  Filled 2024-08-18 (×3): qty 0.1

## 2024-08-18 MED ORDER — INSULIN GLARGINE 100 UNIT/ML ~~LOC~~ SOLN
10.0000 [IU] | SUBCUTANEOUS | Status: DC
  Administered 2024-08-18: 10 [IU] via SUBCUTANEOUS
  Filled 2024-08-18 (×2): qty 0.1

## 2024-08-18 NOTE — Inpatient Diabetes Management (Signed)
 Inpatient Diabetes Program Recommendations  AACE/ADA: New Consensus Statement on Inpatient Glycemic Control (2015)  Target Ranges:  Prepandial:   less than 140 mg/dL      Peak postprandial:   less than 180 mg/dL (1-2 hours)      Critically ill patients:  140 - 180 mg/dL   Lab Results  Component Value Date   GLUCAP 204 (H) 08/18/2024   HGBA1C 13.2 (H) 08/16/2024    Review of Glycemic Control  Latest Reference Range & Units 08/17/24 07:43 08/17/24 12:25 08/17/24 16:37 08/17/24 20:48 08/17/24 21:08 08/18/24 00:00 08/18/24 04:06 08/18/24 08:04  Glucose-Capillary 70 - 99 mg/dL 816 (H) 784 (H) 799 (H) 281 (H) 272 (H) 193 (H) 169 (H) 204 (H)   Diabetes history: DM 2 Outpatient Diabetes medications: no insulin  the last 4-5 weeks, Novolog  10 units tid, Semglee  30 units bid, Jardiance 10 mg Daily, Metformin  1000 mg bid Current orders for Inpatient glycemic control:  Novolog  0-9 units Q4  A1c 13.2% on 10/7  Inpatient Diabetes Program Recommendations:    -  Start Lantus  10 units Daily  Thanks,  Clotilda Bull RN, MSN, BC-ADM Inpatient Diabetes Coordinator Team Pager [phone] (8a-5p)

## 2024-08-18 NOTE — ED Notes (Signed)
 Pt found getting out of bed foot of bed, pt removed his IV and all cardiac leads. Pt put back into bed with fresh gown and reconnected to monitor, pt refused second iv start. Immediatly after walking out of room, pt attempted getting out of bed again with feet on the floor, pt unable to follow directions despite multiple attempts at reorientation and removed gown and monitor leads again. MD notified.

## 2024-08-18 NOTE — Progress Notes (Addendum)
 Pharmacy Antibiotic Note  Ryan [name] is a 58 y.o. male admitted on 08/16/2024 with bacteremia, L foot cellulitis.  Pharmacy has been consulted for Vancomycin  dosing.  Plan: Change vancomycin  to 1000mg  IV q12h (eAUC 498, SCr 1.09, Vd coeff 0.5) Will f/u ID recs F/u SCr and micro data Vanc levels at Css  ADDENDUM (1520):  ID evaluated and changing Vancomycin  to Daptomycin  Plan: Daptomycin 700mg  (~6mg /kg) q24h CK weekly while on Daptomycin Will continue to f/u micro data and renal function  Height: 6' 1 (185.4 cm) Weight: 108.9 kg (240 lb) IBW/kg (Calculated) : 79.9  Temp (24hrs), Avg:98.6 F (37 C), Min:98.2 F (36.8 C), Max:99.2 F (37.3 C)  Recent Labs  Lab 08/16/24 2223 08/16/24 2229 08/16/24 2231 08/17/24 0346 08/18/24 0640  WBC 10.7*  --   --  10.0 9.3  CREATININE  --  1.20 1.27* 1.08 1.09    Estimated Creatinine Clearance: 95.6 mL/min (by C-G formula based on SCr of 1.09 mg/dL).    Allergies  Allergen Reactions   Acetaminophen  Nausea And Vomiting    Antimicrobials this admission: 10/8 Linezolid x1 10/8 Zosyn>> 10/8 vanc>> 10/9 10/10 Daptomycin   Microbiology results: 10/8 Bcx: MRSA  Thank you for allowing pharmacy to be a part of this patient's care.  Vito Ralph, PharmD, BCPS Please see amion for complete clinical pharmacist phone list 08/18/2024 9:20 AM

## 2024-08-18 NOTE — Progress Notes (Signed)
 PROGRESS NOTE    JOHAAN [name]  FMW:[id number] DOB: [birth year] DOA: 08/16/2024 PCP: [name] [name], NP   Brief Narrative:  This 58 yrs old male with medical history significant for poorly controlled type 2 diabetes mellitus, right-sided BKA in April 2024, tobacco abuse presenting with diabetic foot infection, L foot osteomyelitis.  Patient reports worsening left foot redness pain swelling and purulent drainage for last 2 months.  He has been following up with [facility].  Patient was sent in the ED for further evaluation from the wound center yesterday.  Left foot x-ray shows left fifth metatarsal joint changes consistent with osteomyelitis.  Patient was admitted and orthopedics was consulted,  started on empiric antibiotics.  Assessment & Plan:   Principal Problem:   Cellulitis of left lower extremity Active Problems:   Osteomyelitis (HCC)   Uncontrolled type 2 diabetes mellitus with hyperglycemia, without long-term current use of insulin  (HCC)   S/P BKA (below knee amputation) unilateral, right (HCC)   Tobacco use disorder   Chronic prescription opiate use - sees pain clinic in [place], [place].   GERD (gastroesophageal reflux disease)   CHF (congestive heart failure) (HCC)   COPD (chronic obstructive pulmonary disease) (HCC)   Osteomyelitis (HCC) Diabetic foot infection  Patient presented with worsening L foot purulent drainage, swelling and pain x 2 months  L foot plain films concerning for osteomyelitis.  Follow-up blood cultures. Continue empiric IV zyvox and zosyn for infectious coverage . ESR/ CRP elevated MRI foot : Findings compatible with osteomyelitis/septic arthritis of the underlying fifth proximal phalanx and mid to distal fifth metatarsal. Podiatry consulted.  Dr. Harden recommended transtibial amputation, but patient not agreeable at this point. Continue IV antibiotics broad-spectrum and Dr. Harden will reassess on Monday. Follow up recommendations      Uncontrolled type 2 diabetes mellitus with hyperglycemia: Blood sugars 500s on presentation without overt DKA or HHS  BHB WNL , HbA1c 13.2  Continue long acting insulin  and SSI. Diabetic education  Discussed importance of blood sugar management     COPD (chronic obstructive pulmonary disease) (HCC) Stable from a resp standpoint. Continue home inhalers      CHF (congestive heart failure) (HCC) 2D ECHO 05/2024 w/ EF 50-55%  Appears euvolemic at present. Weight today 109 kg  Monitor volume status with treatment .     GERD (gastroesophageal reflux disease) Continue PPI      Chronic prescription opiate use - sees pain clinic in [place], [place]. On oxycodone  15mg  q4 hours prn  Will continue in setting of active diabetic foot infection.     Tobacco use disorder: Discussed cessation at length  Nicotine patch    S/P BKA (below knee amputation) unilateral, right (HCC) Previous R BKA in setting of PVD, tobacco abuse and poorly controlled t2dm.    DVT prophylaxis: SCDs Code Status: Full code Family Communication: No family at bedside Disposition Plan:  Status is: Inpatient Remains inpatient appropriate because: Severity of illness    Consultants:  Podiatry  Procedures:  Antimicrobials:  Anti-infectives (From admission, onward)    Start     Dose/Rate Route Frequency Ordered Stop   08/18/24 0930  vancomycin  (VANCOCIN ) IVPB 1000 mg/200 mL premix       Placed in Followed by Linked Group   1,000 mg 200 mL/hr over 60 Minutes Intravenous Every 12 hours 08/18/24 0916     08/18/24 0715  vancomycin  (VANCOREADY) IVPB 1250 mg/250 mL  Status:  Discontinued       Placed in Followed  by Linked Group   1,250 mg 166.7 mL/hr over 90 Minutes Intravenous Every 12 hours 08/17/24 1914 08/18/24 0916   08/17/24 1915  vancomycin  (VANCOREADY) IVPB 2000 mg/400 mL       Placed in Followed by Linked Group   2,000 mg 200 mL/hr over 120 Minutes Intravenous  Once 08/17/24 1914 08/17/24 2303    08/17/24 1000  linezolid (ZYVOX) IVPB 600 mg  Status:  Discontinued        600 mg 300 mL/hr over 60 Minutes Intravenous Every 12 hours 08/17/24 0228 08/17/24 1914   08/17/24 1000  piperacillin-tazobactam (ZOSYN) IVPB 3.375 g        3.375 g 12.5 mL/hr over 240 Minutes Intravenous Every 8 hours 08/17/24 0303     08/17/24 0200  piperacillin-tazobactam (ZOSYN) IVPB 3.375 g        3.375 g 100 mL/hr over 30 Minutes Intravenous  Once 08/17/24 0158 08/17/24 0216   08/17/24 0200  linezolid (ZYVOX) IVPB 600 mg        600 mg 300 mL/hr over 60 Minutes Intravenous  Once 08/17/24 0158 08/17/24 0415      Subjective: Patient was seen and examined at bedside.  Overnight events noted. Patient was sitting comfortably on the edge of the bed,  states he is not in favor of amputation.   He wants to continue with antibiotics and see if there is no improvement he would consider.  Objective: Vitals:   08/18/24 0429 08/18/24 0600 08/18/24 1000 08/18/24 1010  BP: (!) 160/95 (!) 167/99 (!) 165/92   Pulse: 89 93 90   Resp: 18 19 17    Temp: 98.9 F (37.2 C)   98.1 F (36.7 C)  TempSrc: Oral   Oral  SpO2: 96% 95% 98%   Weight:      Height:        Intake/Output Summary (Last 24 hours) at 08/18/2024 1111 Last data filed at 08/18/2024 1024 Gross per 24 hour  Intake 1093.96 ml  Output --  Net 1093.96 ml   Filed Weights   08/16/24 2208  Weight: 108.9 kg    Examination:  General exam: Appears calm and comfortable, not in any acute distress. Respiratory system: CTA Bilaterally . Respiratory effort normal.  RR 14 Cardiovascular system: S1 & S2 heard, RRR. No JVD, murmurs, rubs, gallops or clicks.  Gastrointestinal system: Abdomen is non distended, soft and nontender.  Normal bowel sounds heard. Central nervous system: Alert and oriented x 3. No focal neurological deficits. Extremities: Right BKA, left foot infection. Skin: No rashes, lesions or ulcers Psychiatry: Judgement and insight appear  normal. Mood & affect appropriate.   Data Reviewed: I have personally reviewed following labs and imaging studies  CBC: Recent Labs  Lab 08/16/24 2223 08/16/24 2229 08/16/24 2252 08/17/24 0346 08/18/24 0640  WBC 10.7*  --   --  10.0 9.3  NEUTROABS  --   --   --  7.8*  --   HGB 13.3 16.7 15.3 11.6* 12.6*  HCT 45.8 49.0 45.0 39.1 42.9  MCV 81.5  --   --  80.8 82.2  PLT 353  --   --  333 348   Basic Metabolic Panel: Recent Labs  Lab 08/16/24 2229 08/16/24 2231 08/16/24 2252 08/17/24 0346 08/18/24 0640  NA 134* 130* 134* 133* 133*  K 3.8 3.6 3.6 3.4* 3.8  CL 94* 91*  --  97* 97*  CO2  --  28  --  27 26  GLUCOSE 573* 579*  --  352* 174*  BUN 12 11  --  9 10  CREATININE 1.20 1.27*  --  1.08 1.09  CALCIUM  --  8.2*  --  7.9* 8.3*  MG  --   --   --  1.6* 1.7   GFR: Estimated Creatinine Clearance: 95.6 mL/min (by C-G formula based on SCr of 1.09 mg/dL). Liver Function Tests: Recent Labs  Lab 08/16/24 2231 08/17/24 0346  AST 16 12*  ALT 14 11  ALKPHOS 201* 170*  BILITOT 0.7 0.6  PROT 7.5 6.8  ALBUMIN 1.6* <1.5*   No results for input(s): LIPASE, AMYLASE in the last 168 hours. No results for input(s): AMMONIA in the last 168 hours. Coagulation Profile: Recent Labs  Lab 08/17/24 0346  INR 1.3*   Cardiac Enzymes: No results for input(s): CKTOTAL, CKMB, CKMBINDEX, TROPONINI in the last 168 hours. BNP (last 3 results) No results for input(s): PROBNP in the last 8760 hours. HbA1C: Recent Labs    08/16/24 2223  HGBA1C 13.2*   CBG: Recent Labs  Lab 08/17/24 2048 08/17/24 2108 08/18/24 0000 08/18/24 0406 08/18/24 0804  GLUCAP 281* 272* 193* 169* 204*   Lipid Profile: No results for input(s): CHOL, HDL, LDLCALC, TRIG, CHOLHDL, LDLDIRECT in the last 72 hours. Thyroid Function Tests: No results for input(s): TSH, T4TOTAL, FREET4, T3FREE, THYROIDAB in the last 72 hours. Anemia Panel: No results for input(s):  VITAMINB12, FOLATE, FERRITIN, TIBC, IRON, RETICCTPCT in the last 72 hours. Sepsis Labs: No results for input(s): PROCALCITON, LATICACIDVEN in the last 168 hours.  Recent Results (from the past 240 hours)  Blood culture (routine x 2)     Status: Abnormal (Preliminary result)   Collection Time: 08/17/24  1:00 AM   Specimen: BLOOD  Result Value Ref Range Status   Specimen Description BLOOD SITE NOT SPECIFIED  Final   Special Requests   Final    BOTTLES DRAWN AEROBIC AND ANAEROBIC Blood Culture adequate volume   Culture  Setup Time   Final    GRAM POSITIVE COCCI IN BOTH AEROBIC AND ANAEROBIC BOTTLES CRITICAL VALUE NOTED.  VALUE IS CONSISTENT WITH PREVIOUSLY REPORTED AND CALLED VALUE. Performed at [facility], [address] [place], [place] [address]    Culture STAPHYLOCOCCUS AUREUS (A)  Final   Report Status PENDING  Incomplete  Blood culture (routine x 2)     Status: Abnormal (Preliminary result)   Collection Time: 08/17/24  1:05 AM   Specimen: BLOOD  Result Value Ref Range Status   Specimen Description BLOOD SITE NOT SPECIFIED  Final   Special Requests   Final    BOTTLES DRAWN AEROBIC AND ANAEROBIC Blood Culture adequate volume   Culture  Setup Time   Final    GRAM POSITIVE COCCI IN CLUSTERS AEROBIC BOTTLE ONLY CRITICAL RESULT CALLED TO, READ BACK BY AND VERIFIED WITH: PHARMD AUSTIN PAYTES ON 08/17/24 @ 1904 BY DRT    Culture (A)  Final    STAPHYLOCOCCUS AUREUS SUSCEPTIBILITIES TO FOLLOW Performed at [facility], [address], [place], [place] [address]    Report Status PENDING  Incomplete  Blood Culture ID Panel (Reflexed)     Status: Abnormal   Collection Time: 08/17/24  1:05 AM  Result Value Ref Range Status   Enterococcus faecalis NOT DETECTED NOT DETECTED Final   Enterococcus Faecium NOT DETECTED NOT DETECTED Final   Listeria monocytogenes NOT DETECTED NOT DETECTED Final   Staphylococcus species DETECTED (A) NOT DETECTED Final     Comment: CRITICAL RESULT  CALLED TO, READ BACK BY AND VERIFIED WITH: PHARMD AUSTIN PAYTES ON 08/17/24 @ 1904 BY DRT    Staphylococcus aureus (BCID) DETECTED (A) NOT DETECTED Final    Comment: Methicillin (oxacillin)-resistant Staphylococcus aureus (MRSA). MRSA is predictably resistant to beta-lactam antibiotics (except ceftaroline). Preferred therapy is vancomycin  unless clinically contraindicated. Patient requires contact precautions if  hospitalized. CRITICAL RESULT CALLED TO, READ BACK BY AND VERIFIED WITH: PHARMD AUSTIN PAYTES ON 08/17/24 @ 1904 BY DRT    Staphylococcus epidermidis NOT DETECTED NOT DETECTED Final   Staphylococcus lugdunensis NOT DETECTED NOT DETECTED Final   Streptococcus species NOT DETECTED NOT DETECTED Final   Streptococcus agalactiae NOT DETECTED NOT DETECTED Final   Streptococcus pneumoniae NOT DETECTED NOT DETECTED Final   Streptococcus pyogenes NOT DETECTED NOT DETECTED Final   A.calcoaceticus-baumannii NOT DETECTED NOT DETECTED Final   Bacteroides fragilis NOT DETECTED NOT DETECTED Final   Enterobacterales NOT DETECTED NOT DETECTED Final   Enterobacter cloacae complex NOT DETECTED NOT DETECTED Final   Escherichia coli NOT DETECTED NOT DETECTED Final   Klebsiella aerogenes NOT DETECTED NOT DETECTED Final   Klebsiella oxytoca NOT DETECTED NOT DETECTED Final   Klebsiella pneumoniae NOT DETECTED NOT DETECTED Final   Proteus species NOT DETECTED NOT DETECTED Final   Salmonella species NOT DETECTED NOT DETECTED Final   Serratia marcescens NOT DETECTED NOT DETECTED Final   Haemophilus influenzae NOT DETECTED NOT DETECTED Final   Neisseria meningitidis NOT DETECTED NOT DETECTED Final   Pseudomonas aeruginosa NOT DETECTED NOT DETECTED Final   Stenotrophomonas maltophilia NOT DETECTED NOT DETECTED Final   Candida albicans NOT DETECTED NOT DETECTED Final   Candida auris NOT DETECTED NOT DETECTED Final   Candida glabrata NOT DETECTED NOT DETECTED Final   Candida krusei  NOT DETECTED NOT DETECTED Final   Candida parapsilosis NOT DETECTED NOT DETECTED Final   Candida tropicalis NOT DETECTED NOT DETECTED Final   Cryptococcus neoformans/gattii NOT DETECTED NOT DETECTED Final   Meth resistant mecA/C and MREJ DETECTED (A) NOT DETECTED Final    Comment: CRITICAL RESULT CALLED TO, READ BACK BY AND VERIFIED WITH: PHARMD AUSTIN PAYTES ON 08/17/24 @ 1904 BY DRT Performed at [facility], [address], [place], [place] [address]     Radiology Studies: MR FOOT LEFT W WO CONTRAST Result Date: 08/17/2024 CLINICAL DATA:  Open wound at the lateral forefoot. Concern for osteomyelitis. EXAM: MRI OF THE LEFT FOREFOOT WITHOUT AND WITH CONTRAST TECHNIQUE: Multiplanar, multisequence MR imaging of the left was performed both before and after administration of intravenous contrast. CONTRAST:  10mL GADAVIST GADOBUTROL 1 MMOL/ML IV SOLN COMPARISON:  Left foot radiographs dated 08/17/2024. FINDINGS: Bones/Joint/Cartilage/Soft tissue Soft tissue wound at the lateral forefoot, overlying the distal fifth metatarsal and fifth MTP joint, extends for a length of approximately 7.4 cm. There is underlying multiloculated heterogenous T2 hyperintense peripherally enhancing collection with foci of gas in the collection and surrounding soft tissues insinuating along the dorsal and plantar margins of the distal fifth metatarsal and fifth MTP joint, measuring approximately 3.5 cm in AP dimension overlying the dorsal aspect of the fifth MTP joint (series 12, images 14-21). These findings are most concerning for gas-forming soft tissue infection with abscess. The lateral soft tissue wound extends into the plantar soft tissues and to the skin surface, concerning for sinus tract (series 6, image 35 and series 11, image 41). Erosive changes of the fifth MTP joint with marrow edema and corresponding T1 hypointensity and enhancement of the fifth proximal phalanx and the mid  to distal fifth metatarsal, most  pronounced at the fifth metatarsal head. These findings are compatible with osteomyelitis/septic arthritis. No marrow signal abnormality identified elsewhere to suggest acute osteomyelitis. Mild osteoarthritis of the first MTP joint. Ligaments First through fourth MTP joint collateral ligaments appear intact. Lisfranc ligament is intact. Muscles and Tendons Tenosynovitis of the fifth digit extensor tendon, which extends through the peripherally enhancing gas containing collection described above. Diffusely increased T2 signal of the intrinsic musculature of the foot, myositis is not excluded. Soft tissue Soft tissue wound at the lateral forefoot, as described above, with surrounding soft tissue edema and enhancement. Subcutaneous edema along the dorsal foot. IMPRESSION: 1. Soft tissue wound at the lateral forefoot extends deep along the dorsal and plantar margins of the distal fifth metatarsal and fifth MTP joint with multiloculated heterogenous gas containing collections, most concerning for gas-forming soft tissue infection with abscess. This wound extends to the skin surface of the lateral forefoot plantar soft tissues, concerning for sinus tract. 2. Findings compatible with osteomyelitis/septic arthritis of the underlying fifth proximal phalanx and mid to distal fifth metatarsal, most pronounced at the fifth MTP joint. 3. Tenosynovitis of the fifth digit extensor tendon, which extends through the peripherally enhancing gas containing collection described above. 4. Diffusely increased T2 signal of the intrinsic musculature of the foot. Myositis is not excluded. Electronically Signed   By: Harrietta Sherry M.D.   On: 08/17/2024 16:43   VAS US  ABI WITH/WO TBI Result Date: 08/17/2024  LOWER EXTREMITY DOPPLER STUDY Patient Name:  Ryan [name]  Date of Exam:   08/17/2024 Medical Rec #: [id number]         Accession #:    [id number] Date of Birth: 1966/08/27         Patient Gender: M Patient Age:   24 years Exam  Location:  [facility] Procedure:      VAS US  ABI WITH/WO TBI Referring Phys: ELSPETH NEWTON --------------------------------------------------------------------------------  Indications: DM foot infection (A1C 13.2) High Risk Factors: Diabetes, current smoker. Other Factors: CHF, RT BKA (2022).  Limitations: Today's exam was limited due to venous interference & hyperemic              flow. Comparison Study: Previous exam was on 03/24/2024 Performing Technologist: Leigh Rom RVT/RDMS  Examination Guidelines: A complete evaluation includes at minimum, Doppler waveform signals and systolic blood pressure reading at the level of bilateral brachial, anterior tibial, and posterior tibial arteries, when vessel segments are accessible. Bilateral testing is considered an integral part of a complete examination. Photoelectric Plethysmograph (PPG) waveforms and toe systolic pressure readings are included as required and additional duplex testing as needed. Limited examinations for reoccurring indications may be performed as noted.  ABI Findings: +--------+------------------+-----+---------+--------+ Right   Rt Pressure (mmHg)IndexWaveform Comment  +--------+------------------+-----+---------+--------+ [id number]                    triphasic         +--------+------------------+-----+---------+--------+ +---------+------------------+-----+-----------+-------+ Left     Lt Pressure (mmHg)IndexWaveform   Comment +---------+------------------+-----+-----------+-------+ Brachial 172                    triphasic          +---------+------------------+-----+-----------+-------+ PTA      176               0.98 multiphasic        +---------+------------------+-----+-----------+-------+ DP       170  0.95 multiphasic        +---------+------------------+-----+-----------+-------+ Great Toe                       Normal            +---------+------------------+-----+-----------+-------+ +-------+-----------+-----------+------------+------------+ ABI/TBIToday's ABIToday's TBIPrevious ABIPrevious TBI +-------+-----------+-----------+------------+------------+ Right  BKA        BKA        BKA         BKA          +-------+-----------+-----------+------------+------------+ Left   0.98       0.95       1.12        0.81         +-------+-----------+-----------+------------+------------+    Left great toe to large for cuff.  Summary: Left: Resting left ankle-brachial index is within normal range.  Great toe waveform appears normal. *See table(s) above for measurements and observations.  Electronically signed by Lonni Gaskins MD on 08/17/2024 at 3:08:16 PM.    Final    DG Foot Complete Left Result Date: 08/17/2024 CLINICAL DATA:  Open wound on the foot laterally EXAM: LEFT FOOT - COMPLETE 3+ VIEW COMPARISON:  None Available. FINDINGS: Soft tissue wound is noted laterally with evidence of subcutaneous air consistent with underlying soft tissue infection. Erosive changes at the base of the fifth proximal phalanx as well as the head of the fifth metatarsal are seen consistent with osteomyelitis. No other erosive changes are seen. Soft tissue swelling is noted. Calcaneal spurs are seen. IMPRESSION: Erosive changes about the fifth MTP joint with evidence of subcutaneous infection and air consistent with osteomyelitis. Electronically Signed   By: Oneil Devonshire M.D.   On: 08/17/2024 01:38   Scheduled Meds:  insulin  aspart  0-9 Units Subcutaneous Q4H   Continuous Infusions:  piperacillin-tazobactam (ZOSYN)  IV Stopped (08/18/24 1024)   vancomycin  1,000 mg (08/18/24 1024)     LOS: 1 day    Time spent: 50 mins    Darcel Dawley, MD [facility]   If 7PM-7AM, please contact night-coverage

## 2024-08-18 NOTE — Consult Note (Signed)
 [facility]    Date of Admission:  08/16/2024         Reason for Consult: MRSA sepsis    Referring Provider: Dr. Leotis   Assessment: Severe sepsis, ongoing. MRSA sepsis. Source of sepsis-left foot osteomyelitis/septic arthritis. Uncontrolled diabetes mellitus. Obesity. Peripheral vascular disease. Gas-forming infection of the left foot with abscess. Osteomyelitis/septic arthritis of the fifth proximal phalanx and mid to distal fifth metatarsal. Tenosynovitis of the fifth digit extensor tendon.  Plan: DC vancomycin . Start the patient on IV daptomycin, dose as per pharmacy. Please set the drainage from the left foot for Gram stain and cultures. Patient started snoring in the middle of conversation, probable OSA. Continue IV Zosyn, dose as per pharmacy. Repeat blood cultures x 2 in 2 days. Will monitor ESR and CRP. Unlikely to salvage the left foot just with conservative treatment.  Patient might need transtibial amputation.  Orthopedic surgery consult greatly appreciated. Patient does not seem to understand the severity of his clinical condition.  Principal Problem:   Cellulitis of left lower extremity Active Problems:   Uncontrolled type 2 diabetes mellitus with hyperglycemia, without long-term current use of insulin  (HCC)   S/P BKA (below knee amputation) unilateral, right (HCC)   Tobacco use disorder   Chronic prescription opiate use - sees pain clinic in [place], [place].   GERD (gastroesophageal reflux disease)   CHF (congestive heart failure) (HCC)   COPD (chronic obstructive pulmonary disease) (HCC)   Osteomyelitis (HCC)    insulin  aspart  0-9 Units Subcutaneous Q4H   insulin  glargine  10 Units Subcutaneous Daily    HPI: Ryan [name] is a 58 y.o. male significant past medical history of uncut diabetes mellitus, status post BKA in April 2024, peripheral vascular disease has had progressive worsening of left foot redness,  erythema, pain as well as purulent drainage.  Patient follows up with [facility].  They were concerned that the patient has cellulitis and osteomyelitis and sent him to the ER.  Patient is not a good historian.  He has history of extensive smoking.  He has smoked 1 pack a day for several years. He had normal white count, mild renal insufficiency slightly elevated acute phase reactants and the left x-ray did show evidence of osteomyelitis in the left fifth MTP joint. Initially patient was placed on linezolid and Zosyn in the ED.  Patient was admitted under the hospitalist service. Patient now has blood cultures growing MRSA.  He has been placed on IV vancomycin .  Zosyn has been continued.  ID consult has been called for further evaluation. Patient was evaluated by orthopedic surgery who recommended a transtibial amputation and patient is adamant against it.     Review of Systems: Review of Systems  Constitutional:  Positive for malaise/fatigue.  HENT: Negative.    Eyes: Negative.   Respiratory: Negative.    Cardiovascular: Negative.   Gastrointestinal: Negative.   Genitourinary: Negative.   Musculoskeletal:  Positive for myalgias.  Neurological: Negative.   Endo/Heme/Allergies: Negative.   Psychiatric/Behavioral: Negative.      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 02/24/2023   Cigarette smoker 01/01/2019   COPD (chronic obstructive pulmonary disease) (HCC) 01/11/2024   Dehiscence of amputation  stump of right lower extremity (HCC) 02/27/2023   Diabetic peripheral neuropathy (HCC) 01/01/2019   Diabetic ulcer of right foot associated with type 2 diabetes mellitus, with necrosis of bone (HCC) 01/16/2021   GERD (gastroesophageal reflux disease)    occ   Left buttock abscess 02/24/2023   Morbid obesity (HCC) 08/18/2023   Open wound of right shoulder 02/24/2023   Other chronic pain 04/23/2020   Pneumonia due to infectious agent 08/16/2023   PVD (peripheral vascular disease) 08/18/2023   Right foot infection 08/29/2020   Right great toe amputee 05/15/2020   S/P BKA (below knee amputation) unilateral, right (HCC) 01/24/2021   Sleep apnea    cpap used for 10 yrs. no recent sleep study   Subacute osteomyelitis of right tibia (HCC) 02/27/2023   Tobacco use disorder 03/19/2020   Uncontrolled type 2 diabetes mellitus with hyperglycemia (HCC) 01/01/2019   Uncontrolled type 2 diabetes mellitus with hyperglycemia, without long-term current use of insulin  (HCC) 02/24/2023   Venous insufficiency of right leg 05/15/2020   Volume overload 09/12/2023   Wound infection after surgery 06/05/2021    Social History   Tobacco Use   Smoking status: Every Day    Average packs/day: 1 pack/day for 67.0 years (67.0 ttl pk-yrs)    Types: Cigarettes    Start date: 26   Smokeless tobacco: Never   Tobacco comments:    Patient refuse  Vaping Use   Vaping status: Never Used  Substance Use Topics   Alcohol use: No   Drug use: No    Family History  Family history unknown: Yes   Allergies  Allergen Reactions   Acetaminophen  Nausea And Vomiting    OBJECTIVE: Blood pressure (!) 154/99, pulse 95, temperature 98.1 F (36.7 C), temperature source Oral, resp. rate 18, height 6' 1 (1.854 m), weight 108.9 kg, SpO2 97%.  Physical Exam Constitutional:      Appearance: He is obese. He is ill-appearing.     Comments: Patient appears chronically ill.  He looks very older than his stated age.  He is  not in acute distress but does appear quite ill.  HENT:     Head: Normocephalic and atraumatic.     Right Ear: External ear normal.     Left Ear: External ear normal.     Mouth/Throat:     Mouth: Mucous membranes are moist.  Eyes:     Conjunctiva/sclera: Conjunctivae normal.     Pupils: Pupils are equal, round, and reactive to light.  Cardiovascular:     Rate and Rhythm: Normal rate and regular rhythm.     Heart sounds: Normal heart sounds.  Pulmonary:     Effort: Pulmonary effort is normal.     Breath sounds: Normal breath sounds.  Abdominal:     General: Bowel sounds are normal.     Palpations: Abdomen is soft.  Musculoskeletal:     Cervical back: Normal range of motion.     Right lower leg: No edema.     Comments: Right BKA stump dressing is intact.     Lab Results Lab Results  Component Value Date   WBC 9.3 08/18/2024   HGB 12.6 (L) 08/18/2024   HCT 42.9 08/18/2024   MCV 82.2 08/18/2024   PLT 348 08/18/2024  Lab Results  Component Value Date   CREATININE 1.09 08/18/2024   BUN 10 08/18/2024   NA 133 (L) 08/18/2024   K 3.8 08/18/2024   CL 97 (L) 08/18/2024   CO2 26 08/18/2024    Lab Results  Component Value Date   ALT 11 08/17/2024   AST 12 (L) 08/17/2024   ALKPHOS 170 (H) 08/17/2024   BILITOT 0.6 08/17/2024     Microbiology: Recent Results (from the past 240 hours)  Blood culture (routine x 2)     Status: Abnormal (Preliminary result)   Collection Time: 08/17/24  1:00 AM   Specimen: BLOOD  Result Value Ref Range Status   Specimen Description BLOOD SITE NOT SPECIFIED  Final   Special Requests   Final    BOTTLES DRAWN AEROBIC AND ANAEROBIC Blood Culture adequate volume   Culture  Setup Time   Final    GRAM POSITIVE COCCI IN BOTH AEROBIC AND ANAEROBIC BOTTLES CRITICAL VALUE NOTED.  VALUE IS CONSISTENT WITH PREVIOUSLY REPORTED AND CALLED VALUE. Performed at [facility], [address], [place], [place] [address]    Culture STAPHYLOCOCCUS  AUREUS (A)  Final   Report Status PENDING  Incomplete  Blood culture (routine x 2)     Status: Abnormal (Preliminary result)   Collection Time: 08/17/24  1:05 AM   Specimen: BLOOD  Result Value Ref Range Status   Specimen Description BLOOD SITE NOT SPECIFIED  Final   Special Requests   Final    BOTTLES DRAWN AEROBIC AND ANAEROBIC Blood Culture adequate volume   Culture  Setup Time   Final    GRAM POSITIVE COCCI IN CLUSTERS AEROBIC BOTTLE ONLY CRITICAL RESULT CALLED TO, READ BACK BY AND VERIFIED WITH: PHARMD AUSTIN PAYTES ON 08/17/24 @ 1904 BY DRT    Culture (A)  Final    STAPHYLOCOCCUS AUREUS SUSCEPTIBILITIES TO FOLLOW Performed at [facility], [address], [place], [place] [address]    Report Status PENDING  Incomplete  Blood Culture ID Panel (Reflexed)     Status: Abnormal   Collection Time: 08/17/24  1:05 AM  Result Value Ref Range Status   Enterococcus faecalis NOT DETECTED NOT DETECTED Final   Enterococcus Faecium NOT DETECTED NOT DETECTED Final   Listeria monocytogenes NOT DETECTED NOT DETECTED Final   Staphylococcus species DETECTED (A) NOT DETECTED Final    Comment: CRITICAL RESULT CALLED TO, READ BACK BY AND VERIFIED WITH: PHARMD AUSTIN PAYTES ON 08/17/24 @ 1904 BY DRT    Staphylococcus aureus (BCID) DETECTED (A) NOT DETECTED Final    Comment: Methicillin (oxacillin)-resistant Staphylococcus aureus (MRSA). MRSA is predictably resistant to beta-lactam antibiotics (except ceftaroline). Preferred therapy is vancomycin  unless clinically contraindicated. Patient requires contact precautions if  hospitalized. CRITICAL RESULT CALLED TO, READ BACK BY AND VERIFIED WITH: PHARMD AUSTIN PAYTES ON 08/17/24 @ 1904 BY DRT    Staphylococcus epidermidis NOT DETECTED NOT DETECTED Final   Staphylococcus lugdunensis NOT DETECTED NOT DETECTED Final   Streptococcus species NOT DETECTED NOT DETECTED Final   Streptococcus agalactiae NOT DETECTED NOT DETECTED Final   Streptococcus  pneumoniae NOT DETECTED NOT DETECTED Final   Streptococcus pyogenes NOT DETECTED NOT DETECTED Final   A.calcoaceticus-baumannii NOT DETECTED NOT DETECTED Final   Bacteroides fragilis NOT DETECTED NOT DETECTED Final   Enterobacterales NOT DETECTED NOT DETECTED Final   Enterobacter cloacae complex NOT DETECTED NOT DETECTED Final   Escherichia coli NOT DETECTED NOT DETECTED Final   Klebsiella aerogenes NOT DETECTED NOT DETECTED Final  Klebsiella oxytoca NOT DETECTED NOT DETECTED Final   Klebsiella pneumoniae NOT DETECTED NOT DETECTED Final   Proteus species NOT DETECTED NOT DETECTED Final   Salmonella species NOT DETECTED NOT DETECTED Final   Serratia marcescens NOT DETECTED NOT DETECTED Final   Haemophilus influenzae NOT DETECTED NOT DETECTED Final   Neisseria meningitidis NOT DETECTED NOT DETECTED Final   Pseudomonas aeruginosa NOT DETECTED NOT DETECTED Final   Stenotrophomonas maltophilia NOT DETECTED NOT DETECTED Final   Candida albicans NOT DETECTED NOT DETECTED Final   Candida auris NOT DETECTED NOT DETECTED Final   Candida glabrata NOT DETECTED NOT DETECTED Final   Candida krusei NOT DETECTED NOT DETECTED Final   Candida parapsilosis NOT DETECTED NOT DETECTED Final   Candida tropicalis NOT DETECTED NOT DETECTED Final   Cryptococcus neoformans/gattii NOT DETECTED NOT DETECTED Final   Meth resistant mecA/C and MREJ DETECTED (A) NOT DETECTED Final    Comment: CRITICAL RESULT CALLED TO, READ BACK BY AND VERIFIED WITH: PHARMD AUSTIN PAYTES ON 08/17/24 @ 1904 BY DRT Performed at [facility], [address] [place], [place] [address]     MR FOOT LEFT W WO CONTRAST Result Date: 08/17/2024 CLINICAL DATA:  Open wound at the lateral forefoot. Concern for osteomyelitis. EXAM: MRI OF THE LEFT FOREFOOT WITHOUT AND WITH CONTRAST TECHNIQUE: Multiplanar, multisequence MR imaging of the left was performed both before and after administration of intravenous contrast. CONTRAST:  10mL GADAVIST  GADOBUTROL 1 MMOL/ML IV SOLN COMPARISON:  Left foot radiographs dated 08/17/2024. FINDINGS: Bones/Joint/Cartilage/Soft tissue Soft tissue wound at the lateral forefoot, overlying the distal fifth metatarsal and fifth MTP joint, extends for a length of approximately 7.4 cm. There is underlying multiloculated heterogenous T2 hyperintense peripherally enhancing collection with foci of gas in the collection and surrounding soft tissues insinuating along the dorsal and plantar margins of the distal fifth metatarsal and fifth MTP joint, measuring approximately 3.5 cm in AP dimension overlying the dorsal aspect of the fifth MTP joint (series 12, images 14-21). These findings are most concerning for gas-forming soft tissue infection with abscess. The lateral soft tissue wound extends into the plantar soft tissues and to the skin surface, concerning for sinus tract (series 6, image 35 and series 11, image 41). Erosive changes of the fifth MTP joint with marrow edema and corresponding T1 hypointensity and enhancement of the fifth proximal phalanx and the mid to distal fifth metatarsal, most pronounced at the fifth metatarsal head. These findings are compatible with osteomyelitis/septic arthritis. No marrow signal abnormality identified elsewhere to suggest acute osteomyelitis. Mild osteoarthritis of the first MTP joint. Ligaments First through fourth MTP joint collateral ligaments appear intact. Lisfranc ligament is intact. Muscles and Tendons Tenosynovitis of the fifth digit extensor tendon, which extends through the peripherally enhancing gas containing collection described above. Diffusely increased T2 signal of the intrinsic musculature of the foot, myositis is not excluded. Soft tissue Soft tissue wound at the lateral forefoot, as described above, with surrounding soft tissue edema and enhancement. Subcutaneous edema along the dorsal foot. IMPRESSION: 1. Soft tissue wound at the lateral forefoot extends deep along the  dorsal and plantar margins of the distal fifth metatarsal and fifth MTP joint with multiloculated heterogenous gas containing collections, most concerning for gas-forming soft tissue infection with abscess. This wound extends to the skin surface of the lateral forefoot plantar soft tissues, concerning for sinus tract. 2. Findings compatible with osteomyelitis/septic arthritis of the underlying fifth proximal phalanx and mid to distal fifth metatarsal, most pronounced at  the fifth MTP joint. 3. Tenosynovitis of the fifth digit extensor tendon, which extends through the peripherally enhancing gas containing collection described above. 4. Diffusely increased T2 signal of the intrinsic musculature of the foot. Myositis is not excluded. Electronically Signed   By: Harrietta Sherry M.D.   On: 08/17/2024 16:43   VAS US  ABI WITH/WO TBI Result Date: 08/17/2024  LOWER EXTREMITY DOPPLER STUDY Patient Name:  Ryan [name]  Date of Exam:   08/17/2024 Medical Rec #: [id number]         Accession #:    [id number] Date of Birth: 1965/12/27         Patient Gender: M Patient Age:   60 years Exam Location:  [facility] Procedure:      VAS US  ABI WITH/WO TBI Referring Phys: ELSPETH NEWTON --------------------------------------------------------------------------------  Indications: DM foot infection (A1C 13.2) High Risk Factors: Diabetes, current smoker. Other Factors: CHF, RT BKA (2022).  Limitations: Today's exam was limited due to venous interference & hyperemic              flow. Comparison Study: Previous exam was on 03/24/2024 Performing Technologist: Leigh Rom RVT/RDMS  Examination Guidelines: A complete evaluation includes at minimum, Doppler waveform signals and systolic blood pressure reading at the level of bilateral brachial, anterior tibial, and posterior tibial arteries, when vessel segments are accessible. Bilateral testing is considered an integral part of a complete examination. Photoelectric Plethysmograph  (PPG) waveforms and toe systolic pressure readings are included as required and additional duplex testing as needed. Limited examinations for reoccurring indications may be performed as noted.  ABI Findings: +--------+------------------+-----+---------+--------+ Right   Rt Pressure (mmHg)IndexWaveform Comment  +--------+------------------+-----+---------+--------+ [id number]                    triphasic         +--------+------------------+-----+---------+--------+ +---------+------------------+-----+-----------+-------+ Left     Lt Pressure (mmHg)IndexWaveform   Comment +---------+------------------+-----+-----------+-------+ Brachial 172                    triphasic          +---------+------------------+-----+-----------+-------+ PTA      176               0.98 multiphasic        +---------+------------------+-----+-----------+-------+ DP       170               0.95 multiphasic        +---------+------------------+-----+-----------+-------+ Great Toe                       Normal           +---------+------------------+-----+-----------+-------+ +-------+-----------+-----------+------------+------------+ ABI/TBIToday's ABIToday's TBIPrevious ABIPrevious TBI +-------+-----------+-----------+------------+------------+ Right  BKA        BKA        BKA         BKA          +-------+-----------+-----------+------------+------------+ Left   0.98       0.95       1.12        0.81         +-------+-----------+-----------+------------+------------+    Left great toe to large for cuff.  Summary: Left: Resting left ankle-brachial index is within normal range.  Great toe waveform appears normal. *See table(s) above for measurements and observations.  Electronically signed by Lonni Gaskins MD on 08/17/2024 at 3:08:16 PM.    Final    DG Foot Complete Left Result Date:  08/17/2024 CLINICAL DATA:  Open wound on the foot laterally EXAM: LEFT FOOT - COMPLETE 3+ VIEW  COMPARISON:  None Available. FINDINGS: Soft tissue wound is noted laterally with evidence of subcutaneous air consistent with underlying soft tissue infection. Erosive changes at the base of the fifth proximal phalanx as well as the head of the fifth metatarsal are seen consistent with osteomyelitis. No other erosive changes are seen. Soft tissue swelling is noted. Calcaneal spurs are seen. IMPRESSION: Erosive changes about the fifth MTP joint with evidence of subcutaneous infection and air consistent with osteomyelitis. Electronically Signed   By: Oneil Devonshire M.D.   On: 08/17/2024 01:38    Dr. Zoanne, MD  [facility] Cell phone-[phone]  08/18/2024 3:42 PM    Total Encounter Time: 80 minutes

## 2024-08-19 ENCOUNTER — Ambulatory Visit

## 2024-08-19 ENCOUNTER — Inpatient Hospital Stay (HOSPITAL_COMMUNITY)

## 2024-08-19 DIAGNOSIS — R7881 Bacteremia: Secondary | ICD-10-CM | POA: Diagnosis not present

## 2024-08-19 DIAGNOSIS — L03116 Cellulitis of left lower limb: Secondary | ICD-10-CM | POA: Diagnosis not present

## 2024-08-19 LAB — CK: Total CK: 57 U/L (ref 49–397)

## 2024-08-19 LAB — ECHOCARDIOGRAM COMPLETE
AV Mean grad: 6 mmHg
AV Peak grad: 9.2 mmHg
Ao pk vel: 1.52 m/s
Area-P 1/2: 3.81 cm2
Height: 73 in
S' Lateral: 2.19 cm
Single Plane A4C EF: 65.1 %
Weight: 3840 [oz_av]

## 2024-08-19 LAB — GLUCOSE, CAPILLARY
Glucose-Capillary: 295 mg/dL — ABNORMAL HIGH (ref 70–99)
Glucose-Capillary: 243 mg/dL — ABNORMAL HIGH (ref 70–99)
Glucose-Capillary: 185 mg/dL — ABNORMAL HIGH (ref 70–99)
Glucose-Capillary: 261 mg/dL — ABNORMAL HIGH (ref 70–99)
Glucose-Capillary: 290 mg/dL — ABNORMAL HIGH (ref 70–99)
Glucose-Capillary: 343 mg/dL — ABNORMAL HIGH (ref 70–99)

## 2024-08-19 LAB — CULTURE, BLOOD (ROUTINE X 2): Special Requests: ADEQUATE

## 2024-08-19 MED ORDER — ENOXAPARIN SODIUM 60 MG/0.6ML IJ SOSY
50.0000 mg | PREFILLED_SYRINGE | Freq: Every day | INTRAMUSCULAR | Status: DC
Start: 2024-08-19 — End: 2024-08-31
  Administered 2024-08-19 – 2024-08-30 (×10): 50 mg via SUBCUTANEOUS
  Filled 2024-08-19 (×11): qty 0.6

## 2024-08-19 MED ORDER — INSULIN ASPART 100 UNIT/ML IJ SOLN
0.0000 [IU] | Freq: Three times a day (TID) | INTRAMUSCULAR | Status: DC
  Administered 2024-08-19 (×2): 8 [IU] via SUBCUTANEOUS

## 2024-08-19 MED ORDER — INSULIN ASPART 100 UNIT/ML IJ SOLN
0.0000 [IU] | Freq: Three times a day (TID) | INTRAMUSCULAR | Status: DC
  Administered 2024-08-20: 5 [IU] via SUBCUTANEOUS
  Administered 2024-08-20 (×2): 11 [IU] via SUBCUTANEOUS
  Administered 2024-08-21: 5 [IU] via SUBCUTANEOUS
  Administered 2024-08-21: 8 [IU] via SUBCUTANEOUS
  Administered 2024-08-21 – 2024-08-22 (×2): 11 [IU] via SUBCUTANEOUS
  Administered 2024-08-22: 5 [IU] via SUBCUTANEOUS
  Administered 2024-08-22: 8 [IU] via SUBCUTANEOUS
  Administered 2024-08-23: 11 [IU] via SUBCUTANEOUS
  Administered 2024-08-23: 3 [IU] via SUBCUTANEOUS
  Administered 2024-08-23: 8 [IU] via SUBCUTANEOUS
  Administered 2024-08-24 (×3): 3 [IU] via SUBCUTANEOUS
  Administered 2024-08-25: 8 [IU] via SUBCUTANEOUS

## 2024-08-19 MED ORDER — GABAPENTIN 300 MG PO CAPS
300.0000 mg | ORAL_CAPSULE | Freq: Three times a day (TID) | ORAL | Status: DC
Start: 2024-08-19 — End: 2024-08-24
  Administered 2024-08-19 – 2024-08-24 (×17): 300 mg via ORAL
  Filled 2024-08-19 (×17): qty 1

## 2024-08-19 MED ORDER — IRBESARTAN 150 MG PO TABS
150.0000 mg | ORAL_TABLET | Freq: Every day | ORAL | Status: DC
  Administered 2024-08-19 – 2024-08-21 (×3): 150 mg via ORAL
  Filled 2024-08-19 (×3): qty 1

## 2024-08-19 MED ORDER — INSULIN GLARGINE 100 UNIT/ML ~~LOC~~ SOLN
15.0000 [IU] | Freq: Every day | SUBCUTANEOUS | Status: DC
  Administered 2024-08-20: 15 [IU] via SUBCUTANEOUS
  Filled 2024-08-19 (×2): qty 0.15

## 2024-08-19 MED ORDER — INSULIN ASPART 100 UNIT/ML IJ SOLN
0.0000 [IU] | Freq: Every day | INTRAMUSCULAR | Status: DC
  Administered 2024-08-19 – 2024-08-21 (×3): 4 [IU] via SUBCUTANEOUS
  Administered 2024-08-22 – 2024-08-23 (×2): 2 [IU] via SUBCUTANEOUS
  Administered 2024-08-24: 3 [IU] via SUBCUTANEOUS

## 2024-08-19 NOTE — Plan of Care (Signed)

## 2024-08-19 NOTE — Progress Notes (Signed)
 PROGRESS NOTE    Ryan [name]  FMW:[id number] DOB: Mar 19, 1966 DOA: 08/16/2024 PCP: [name] [name], NP   Brief Narrative:  This 58 yrs old male with medical history significant for poorly controlled type 2 diabetes mellitus, right-sided BKA in April 2024, tobacco abuse presenting with diabetic foot infection, L foot osteomyelitis.  Patient reports worsening left foot redness pain swelling and purulent drainage for last 2 months.  He has been following up with [facility].  Patient was sent in the ED for further evaluation from the wound center yesterday.  Left foot x-ray shows left fifth metatarsal joint changes consistent with osteomyelitis.  Patient was admitted and orthopedics was consulted,  started on empiric antibiotics.  Assessment & Plan:   Principal Problem:   Cellulitis of left lower extremity Active Problems:   Osteomyelitis (HCC)   Uncontrolled type 2 diabetes mellitus with hyperglycemia, without long-term current use of insulin  (HCC)   S/P BKA (below knee amputation) unilateral, right (HCC)   Tobacco use disorder   Chronic prescription opiate use - sees pain clinic in [place], [place].   GERD (gastroesophageal reflux disease)   CHF (congestive heart failure) (HCC)   COPD (chronic obstructive pulmonary disease) (HCC)   Osteomyelitis (HCC) Diabetic foot infection: MRSA bacteremia: Patient presented with worsening L foot purulent drainage, swelling and pain x 2 months  L foot plain films concerning for osteomyelitis.  Blood cultures positive for MRSA Initiated on IV zyvox and zosyn for infectious coverage , Antibiotics changed to IV daptomycin and Zosyn by ID. ESR/ CRP elevated. MRI foot : Findings compatible with osteomyelitis/septic arthritis of the underlying fifth proximal phalanx and mid to distal fifth metatarsal. Podiatry consulted.  Dr. Harden recommended transtibial amputation, but patient not agreeable at this point. Continue IV antibiotics  broad-spectrum and Dr. Harden will reassess on Monday. Follow up recommendations.   Uncontrolled type 2 diabetes mellitus with hyperglycemia: Blood sugars 500s on presentation without overt DKA or HHS  BHB WNL , HbA1c 13.2  Continue long acting insulin  and SSI. Diabetic education  Discussed importance of blood sugar management   COPD (chronic obstructive pulmonary disease) (HCC) Stable from a resp standpoint. Continue home inhalers    CHF (congestive heart failure) (HCC) 2D ECHO 05/2024 w/ EF 50-55%  Appears euvolemic at present. Weight today 109 kg  Monitor volume status with treatment.    GERD (gastroesophageal reflux disease) Continue PPI.    Chronic prescription opiate use - sees pain clinic in [place], [place]. On oxycodone  15mg  q4 hours prn  Will continue in setting of active diabetic foot infection.   Tobacco use disorder: Discussed cessation at length.  Nicotine patch.    S/P BKA (below knee amputation) unilateral, right (HCC) Previous R BKA in setting of PVD, tobacco abuse and poorly controlled t2dm.    DVT prophylaxis: SCDs Code Status: Full code Family Communication: No family at bedside. Disposition Plan:  Status is: Inpatient Remains inpatient appropriate because: Severity of illness    Consultants:  Podiatry  Procedures:  Antimicrobials:  Anti-infectives (From admission, onward)    Start     Dose/Rate Route Frequency Ordered Stop   08/18/24 1600  DAPTOmycin (CUBICIN) IVPB 700 mg/100mL premix  Status:  Discontinued        6 mg/kg  108.9 kg 200 mL/hr over 30 Minutes Intravenous Daily 08/18/24 1519 08/18/24 1529   08/18/24 1600  DAPTOmycin (CUBICIN) IVPB 700 mg/168mL premix        8 mg/kg  91.5 kg (Adjusted) 200 mL/hr over  30 Minutes Intravenous Daily 08/18/24 1529     08/18/24 0930  vancomycin  (VANCOCIN ) IVPB 1000 mg/200 mL premix  Status:  Discontinued       Placed in Followed by Linked Group   1,000 mg 200 mL/hr over 60 Minutes Intravenous Every  12 hours 08/18/24 0916 08/18/24 1519   08/18/24 0715  vancomycin  (VANCOREADY) IVPB 1250 mg/250 mL  Status:  Discontinued       Placed in Followed by Linked Group   1,250 mg 166.7 mL/hr over 90 Minutes Intravenous Every 12 hours 08/17/24 1914 08/18/24 0916   08/17/24 1915  vancomycin  (VANCOREADY) IVPB 2000 mg/400 mL       Placed in Followed by Linked Group   2,000 mg 200 mL/hr over 120 Minutes Intravenous  Once 08/17/24 1914 08/17/24 2303   08/17/24 1000  linezolid (ZYVOX) IVPB 600 mg  Status:  Discontinued        600 mg 300 mL/hr over 60 Minutes Intravenous Every 12 hours 08/17/24 0228 08/17/24 1914   08/17/24 1000  piperacillin-tazobactam (ZOSYN) IVPB 3.375 g        3.375 g 12.5 mL/hr over 240 Minutes Intravenous Every 8 hours 08/17/24 0303     08/17/24 0200  piperacillin-tazobactam (ZOSYN) IVPB 3.375 g        3.375 g 100 mL/hr over 30 Minutes Intravenous  Once 08/17/24 0158 08/17/24 0216   08/17/24 0200  linezolid (ZYVOX) IVPB 600 mg        600 mg 300 mL/hr over 60 Minutes Intravenous  Once 08/17/24 0158 08/17/24 0415      Subjective: Patient was seen and examined at bedside. Overnight events noted. Patient states he is not in favor of amputation. He wants to continue with antibiotics and see. Patient does not seem to understand the severity of his clinical condition.  Objective: Vitals:   08/18/24 2319 08/19/24 0538 08/19/24 0740 08/19/24 1107  BP: (!) 157/82 (!) 149/76 (!) 150/87 (!) 140/80  Pulse: 83 81 90 93  Resp: 19 20 20 18   Temp: 98.9 F (37.2 C) 98.3 F (36.8 C) 98.4 F (36.9 C) 98.3 F (36.8 C)  TempSrc: Oral Oral Oral Oral  SpO2: 95% 93% 93% 95%  Weight:      Height:        Intake/Output Summary (Last 24 hours) at 08/19/2024 1305 Last data filed at 08/19/2024 0546 Gross per 24 hour  Intake 188.61 ml  Output 1200 ml  Net -1011.39 ml   Filed Weights   08/16/24 2208  Weight: 108.9 kg    Examination:  General exam: Appears calm and comfortable,  not in any acute distress. Respiratory system: CTA Bilaterally . Respiratory effort normal.  RR 16 Cardiovascular system: S1 & S2 heard, RRR. No JVD, murmurs, rubs, gallops or clicks.  Gastrointestinal system: Abdomen is non distended, soft and nontender.  Normal bowel sounds heard. Central nervous system: Alert and oriented x 3. No focal neurological deficits. Extremities: Right BKA, left foot infection. Skin: No rashes, lesions or ulcers Psychiatry: Judgement and insight appear normal. Mood & affect appropriate.   Data Reviewed: I have personally reviewed following labs and imaging studies  CBC: Recent Labs  Lab 08/16/24 2223 08/16/24 2229 08/16/24 2252 08/17/24 0346 08/18/24 0640  WBC 10.7*  --   --  10.0 9.3  NEUTROABS  --   --   --  7.8*  --   HGB 13.3 16.7 15.3 11.6* 12.6*  HCT 45.8 49.0 45.0 39.1 42.9  MCV 81.5  --   --  80.8 82.2  PLT 353  --   --  333 348   Basic Metabolic Panel: Recent Labs  Lab 08/16/24 2229 08/16/24 2231 08/16/24 2252 08/17/24 0346 08/18/24 0640  NA 134* 130* 134* 133* 133*  K 3.8 3.6 3.6 3.4* 3.8  CL 94* 91*  --  97* 97*  CO2  --  28  --  27 26  GLUCOSE 573* 579*  --  352* 174*  BUN 12 11  --  9 10  CREATININE 1.20 1.27*  --  1.08 1.09  CALCIUM  --  8.2*  --  7.9* 8.3*  MG  --   --   --  1.6* 1.7   GFR: Estimated Creatinine Clearance: 95.6 mL/min (by C-G formula based on SCr of 1.09 mg/dL). Liver Function Tests: Recent Labs  Lab 08/16/24 2231 08/17/24 0346  AST 16 12*  ALT 14 11  ALKPHOS 201* 170*  BILITOT 0.7 0.6  PROT 7.5 6.8  ALBUMIN 1.6* <1.5*   No results for input(s): LIPASE, AMYLASE in the last 168 hours. No results for input(s): AMMONIA in the last 168 hours. Coagulation Profile: Recent Labs  Lab 08/17/24 0346  INR 1.3*   Cardiac Enzymes: Recent Labs  Lab 08/19/24 0933  CKTOTAL 57   BNP (last 3 results) No results for input(s): PROBNP in the last 8760 hours. HbA1C: Recent Labs    08/16/24 2223   HGBA1C 13.2*   CBG: Recent Labs  Lab 08/18/24 2030 08/19/24 0104 08/19/24 0536 08/19/24 0803 08/19/24 1112  GLUCAP 287* 295* 243* 185* 261*   Lipid Profile: No results for input(s): CHOL, HDL, LDLCALC, TRIG, CHOLHDL, LDLDIRECT in the last 72 hours. Thyroid Function Tests: No results for input(s): TSH, T4TOTAL, FREET4, T3FREE, THYROIDAB in the last 72 hours. Anemia Panel: No results for input(s): VITAMINB12, FOLATE, FERRITIN, TIBC, IRON, RETICCTPCT in the last 72 hours. Sepsis Labs: No results for input(s): PROCALCITON, LATICACIDVEN in the last 168 hours.  Recent Results (from the past 240 hours)  Blood culture (routine x 2)     Status: Abnormal   Collection Time: 08/17/24  1:00 AM   Specimen: BLOOD  Result Value Ref Range Status   Specimen Description BLOOD SITE NOT SPECIFIED  Final   Special Requests   Final    BOTTLES DRAWN AEROBIC AND ANAEROBIC Blood Culture adequate volume   Culture  Setup Time   Final    GRAM POSITIVE COCCI IN BOTH AEROBIC AND ANAEROBIC BOTTLES CRITICAL VALUE NOTED.  VALUE IS CONSISTENT WITH PREVIOUSLY REPORTED AND CALLED VALUE.    Culture (A)  Final    STAPHYLOCOCCUS AUREUS SUSCEPTIBILITIES PERFORMED ON PREVIOUS CULTURE WITHIN THE LAST 5 DAYS. Performed at [facility], [address], [place], [place] [address]    Report Status 08/19/2024 FINAL  Final  Blood culture (routine x 2)     Status: Abnormal (Preliminary result)   Collection Time: 08/17/24  1:05 AM   Specimen: BLOOD  Result Value Ref Range Status   Specimen Description BLOOD SITE NOT SPECIFIED  Final   Special Requests   Final    BOTTLES DRAWN AEROBIC AND ANAEROBIC Blood Culture adequate volume   Culture  Setup Time   Final    GRAM POSITIVE COCCI IN CLUSTERS IN BOTH AEROBIC AND ANAEROBIC BOTTLES CRITICAL RESULT CALLED TO, READ BACK BY AND VERIFIED WITH: PHARMD AUSTIN PAYTES ON 08/17/24 @ 1904 BY DRT    Culture (A)  Final    METHICILLIN  RESISTANT STAPHYLOCOCCUS AUREUS Sent to [facility]  for further susceptibility testing. Performed at [facility], [address], [place], [place] [address]    Report Status PENDING  Incomplete   Organism ID, Bacteria METHICILLIN RESISTANT STAPHYLOCOCCUS AUREUS  Final      Susceptibility   Methicillin resistant staphylococcus aureus - MIC*    CIPROFLOXACIN >=8 RESISTANT Resistant     ERYTHROMYCIN >=8 RESISTANT Resistant     GENTAMICIN <=0.5 SENSITIVE Sensitive     OXACILLIN >=4 RESISTANT Resistant     TETRACYCLINE <=1 SENSITIVE Sensitive     VANCOMYCIN  <=0.5 SENSITIVE Sensitive     TRIMETH/SULFA 80 RESISTANT Resistant     CLINDAMYCIN <=0.25 SENSITIVE Sensitive     RIFAMPIN <=0.5 SENSITIVE Sensitive     Inducible Clindamycin NEGATIVE Sensitive     LINEZOLID 2 SENSITIVE Sensitive     * METHICILLIN RESISTANT STAPHYLOCOCCUS AUREUS  Blood Culture ID Panel (Reflexed)     Status: Abnormal   Collection Time: 08/17/24  1:05 AM  Result Value Ref Range Status   Enterococcus faecalis NOT DETECTED NOT DETECTED Final   Enterococcus Faecium NOT DETECTED NOT DETECTED Final   Listeria monocytogenes NOT DETECTED NOT DETECTED Final   Staphylococcus species DETECTED (A) NOT DETECTED Final    Comment: CRITICAL RESULT CALLED TO, READ BACK BY AND VERIFIED WITH: PHARMD AUSTIN PAYTES ON 08/17/24 @ 1904 BY DRT    Staphylococcus aureus (BCID) DETECTED (A) NOT DETECTED Final    Comment: Methicillin (oxacillin)-resistant Staphylococcus aureus (MRSA). MRSA is predictably resistant to beta-lactam antibiotics (except ceftaroline). Preferred therapy is vancomycin  unless clinically contraindicated. Patient requires contact precautions if  hospitalized. CRITICAL RESULT CALLED TO, READ BACK BY AND VERIFIED WITH: PHARMD AUSTIN PAYTES ON 08/17/24 @ 1904 BY DRT    Staphylococcus epidermidis NOT DETECTED NOT DETECTED Final   Staphylococcus lugdunensis NOT DETECTED NOT DETECTED Final   Streptococcus species NOT  DETECTED NOT DETECTED Final   Streptococcus agalactiae NOT DETECTED NOT DETECTED Final   Streptococcus pneumoniae NOT DETECTED NOT DETECTED Final   Streptococcus pyogenes NOT DETECTED NOT DETECTED Final   A.calcoaceticus-baumannii NOT DETECTED NOT DETECTED Final   Bacteroides fragilis NOT DETECTED NOT DETECTED Final   Enterobacterales NOT DETECTED NOT DETECTED Final   Enterobacter cloacae complex NOT DETECTED NOT DETECTED Final   Escherichia coli NOT DETECTED NOT DETECTED Final   Klebsiella aerogenes NOT DETECTED NOT DETECTED Final   Klebsiella oxytoca NOT DETECTED NOT DETECTED Final   Klebsiella pneumoniae NOT DETECTED NOT DETECTED Final   Proteus species NOT DETECTED NOT DETECTED Final   Salmonella species NOT DETECTED NOT DETECTED Final   Serratia marcescens NOT DETECTED NOT DETECTED Final   Haemophilus influenzae NOT DETECTED NOT DETECTED Final   Neisseria meningitidis NOT DETECTED NOT DETECTED Final   Pseudomonas aeruginosa NOT DETECTED NOT DETECTED Final   Stenotrophomonas maltophilia NOT DETECTED NOT DETECTED Final   Candida albicans NOT DETECTED NOT DETECTED Final   Candida auris NOT DETECTED NOT DETECTED Final   Candida glabrata NOT DETECTED NOT DETECTED Final   Candida krusei NOT DETECTED NOT DETECTED Final   Candida parapsilosis NOT DETECTED NOT DETECTED Final   Candida tropicalis NOT DETECTED NOT DETECTED Final   Cryptococcus neoformans/gattii NOT DETECTED NOT DETECTED Final   Meth resistant mecA/C and MREJ DETECTED (A) NOT DETECTED Final    Comment: CRITICAL RESULT CALLED TO, READ BACK BY AND VERIFIED WITH: PHARMD AUSTIN PAYTES ON 08/17/24 @ 1904 BY DRT Performed at [facility], [address], [place], [place] [address]     Radiology Studies:  MR FOOT LEFT W WO CONTRAST Result Date: 08/17/2024 CLINICAL DATA:  Open wound at the lateral forefoot. Concern for osteomyelitis. EXAM: MRI OF THE LEFT FOREFOOT WITHOUT AND WITH CONTRAST TECHNIQUE: Multiplanar, multisequence  MR imaging of the left was performed both before and after administration of intravenous contrast. CONTRAST:  10mL GADAVIST GADOBUTROL 1 MMOL/ML IV SOLN COMPARISON:  Left foot radiographs dated 08/17/2024. FINDINGS: Bones/Joint/Cartilage/Soft tissue Soft tissue wound at the lateral forefoot, overlying the distal fifth metatarsal and fifth MTP joint, extends for a length of approximately 7.4 cm. There is underlying multiloculated heterogenous T2 hyperintense peripherally enhancing collection with foci of gas in the collection and surrounding soft tissues insinuating along the dorsal and plantar margins of the distal fifth metatarsal and fifth MTP joint, measuring approximately 3.5 cm in AP dimension overlying the dorsal aspect of the fifth MTP joint (series 12, images 14-21). These findings are most concerning for gas-forming soft tissue infection with abscess. The lateral soft tissue wound extends into the plantar soft tissues and to the skin surface, concerning for sinus tract (series 6, image 35 and series 11, image 41). Erosive changes of the fifth MTP joint with marrow edema and corresponding T1 hypointensity and enhancement of the fifth proximal phalanx and the mid to distal fifth metatarsal, most pronounced at the fifth metatarsal head. These findings are compatible with osteomyelitis/septic arthritis. No marrow signal abnormality identified elsewhere to suggest acute osteomyelitis. Mild osteoarthritis of the first MTP joint. Ligaments First through fourth MTP joint collateral ligaments appear intact. Lisfranc ligament is intact. Muscles and Tendons Tenosynovitis of the fifth digit extensor tendon, which extends through the peripherally enhancing gas containing collection described above. Diffusely increased T2 signal of the intrinsic musculature of the foot, myositis is not excluded. Soft tissue Soft tissue wound at the lateral forefoot, as described above, with surrounding soft tissue edema and enhancement.  Subcutaneous edema along the dorsal foot. IMPRESSION: 1. Soft tissue wound at the lateral forefoot extends deep along the dorsal and plantar margins of the distal fifth metatarsal and fifth MTP joint with multiloculated heterogenous gas containing collections, most concerning for gas-forming soft tissue infection with abscess. This wound extends to the skin surface of the lateral forefoot plantar soft tissues, concerning for sinus tract. 2. Findings compatible with osteomyelitis/septic arthritis of the underlying fifth proximal phalanx and mid to distal fifth metatarsal, most pronounced at the fifth MTP joint. 3. Tenosynovitis of the fifth digit extensor tendon, which extends through the peripherally enhancing gas containing collection described above. 4. Diffusely increased T2 signal of the intrinsic musculature of the foot. Myositis is not excluded. Electronically Signed   By: Harrietta Sherry M.D.   On: 08/17/2024 16:43   VAS US  ABI WITH/WO TBI Result Date: 08/17/2024  LOWER EXTREMITY DOPPLER STUDY Patient Name:  MARTAVIS GURNEY  Date of Exam:   08/17/2024 Medical Rec #: [id number]         Accession #:    [id number] Date of Birth: 25-May-1966         Patient Gender: M Patient Age:   54 years Exam Location:  [facility] Procedure:      VAS US  ABI WITH/WO TBI Referring Phys: ELSPETH NEWTON --------------------------------------------------------------------------------  Indications: DM foot infection (A1C 13.2) High Risk Factors: Diabetes, current smoker. Other Factors: CHF, RT BKA (2022).  Limitations: Today's exam was limited due to venous interference & hyperemic              flow. Comparison Study: Previous exam was on 03/24/2024  Performing Technologist: Leigh Rom RVT/RDMS  Examination Guidelines: A complete evaluation includes at minimum, Doppler waveform signals and systolic blood pressure reading at the level of bilateral brachial, anterior tibial, and posterior tibial arteries, when vessel segments  are accessible. Bilateral testing is considered an integral part of a complete examination. Photoelectric Plethysmograph (PPG) waveforms and toe systolic pressure readings are included as required and additional duplex testing as needed. Limited examinations for reoccurring indications may be performed as noted.  ABI Findings: +--------+------------------+-----+---------+--------+ Right   Rt Pressure (mmHg)IndexWaveform Comment  +--------+------------------+-----+---------+--------+ [id number]                    triphasic         +--------+------------------+-----+---------+--------+ +---------+------------------+-----+-----------+-------+ Left     Lt Pressure (mmHg)IndexWaveform   Comment +---------+------------------+-----+-----------+-------+ Brachial 172                    triphasic          +---------+------------------+-----+-----------+-------+ PTA      176               0.98 multiphasic        +---------+------------------+-----+-----------+-------+ DP       170               0.95 multiphasic        +---------+------------------+-----+-----------+-------+ Great Toe                       Normal           +---------+------------------+-----+-----------+-------+ +-------+-----------+-----------+------------+------------+ ABI/TBIToday's ABIToday's TBIPrevious ABIPrevious TBI +-------+-----------+-----------+------------+------------+ Right  BKA        BKA        BKA         BKA          +-------+-----------+-----------+------------+------------+ Left   0.98       0.95       1.12        0.81         +-------+-----------+-----------+------------+------------+    Left great toe to large for cuff.  Summary: Left: Resting left ankle-brachial index is within normal range.  Great toe waveform appears normal. *See table(s) above for measurements and observations.  Electronically signed by Lonni Gaskins MD on 08/17/2024 at 3:08:16 PM.    Final    Scheduled  Meds:  enoxaparin (LOVENOX) injection  50 mg Subcutaneous Daily   gabapentin   300 mg Oral TID   insulin  aspart  0-15 Units Subcutaneous TID WC   [START ON 08/20/2024] insulin  glargine  15 Units Subcutaneous Daily   irbesartan  150 mg Oral Daily   Continuous Infusions:  DAPTOmycin Stopped (08/18/24 2234)   piperacillin-tazobactam (ZOSYN)  IV 3.375 g (08/19/24 0546)     LOS: 2 days    Time spent: 35 mins    Darcel Dawley, MD [facility]   If 7PM-7AM, please contact night-coverage

## 2024-08-19 NOTE — Progress Notes (Signed)
 [facility]  Date of Admission:  08/16/2024       Ryan [name] is a 58 y.o. male significant past medical history of uncontrolled diabetes mellitus, status post BKA in April 2024, peripheral vascular disease has had progressive worsening of left foot redness, erythema, pain as well as purulent drainage.  Patient follows up with [facility].  They were concerned that the patient has cellulitis and osteomyelitis and sent him to the ER.  Patient is not a good historian.  He has history of extensive smoking.  He has smoked 1 pack a day for several years. He had normal white count, mild renal insufficiency slightly elevated acute phase reactants and the left x-ray did show evidence of osteomyelitis in the left fifth MTP joint. Initially patient was placed on linezolid and Zosyn in the ED.  Patient was admitted under the hospitalist service. Patient now has blood cultures growing MRSA.  He has been placed on IV vancomycin .  Zosyn has been continued.  ID consult has been called for further evaluation. Patient was evaluated by orthopedic surgery who recommended a transtibial amputation and patient is adamant against it.      ASSESSMENT: Sepsis, on an ongoing issue. MRSA sepsis. Patient is tolerating daptomycin quite well.  Dose as per pharmacy. Send is tolerating IV Zosyn. Patient wants to continue with antibiotics and refuses amputation. Source of sepsis-left foot osteomyelitis/septic arthritis. Gas-forming infection of the left foot with abscess. Osteomyelitis/septic arthritis of the fifth proximal phalanx and mid to distal fifth metatarsal. Tenosynovitis of the fifth digit extensor tendon. Orthopedic surgery had recommended transtibial amputation. Unlikely to salvage the foot just with conservative management. Status post TTE.  PLAN: Will follow-up on the repeat blood cultures x 2 sent today. Patient is already on antibiotics and so the  wound culture yield might not be accurate. If patient continues to have purulent drainage, please send for cultures and sensitivity in both aerobic and anaerobic bottles. If repeat blood cultures are again positive for MRSA, patient might benefit from TEE. Will follow-up on the TTE results. Continue IV Zosyn and IV daptomycin for now. ID will follow along.  Principal Problem:   Cellulitis of left lower extremity Active Problems:   Uncontrolled type 2 diabetes mellitus with hyperglycemia, without long-term current use of insulin  (HCC)   S/P BKA (below knee amputation) unilateral, right (HCC)   Tobacco use disorder   Chronic prescription opiate use - sees pain clinic in [place], [place].   GERD (gastroesophageal reflux disease)   CHF (congestive heart failure) (HCC)   COPD (chronic obstructive pulmonary disease) (HCC)   Osteomyelitis (HCC)    enoxaparin (LOVENOX) injection  50 mg Subcutaneous Daily   gabapentin   300 mg Oral TID   insulin  aspart  0-15 Units Subcutaneous TID WC   [START ON 08/20/2024] insulin  glargine  15 Units Subcutaneous Daily   irbesartan  150 mg Oral Daily    SUBJECTIVE: Patient states he feels better today.  He still does not want amputation and continue with antibiotics.  Review of Systems: Review of Systems  Constitutional:  Positive for chills and malaise/fatigue.  HENT: Negative.    Eyes: Negative.   Respiratory: Negative.    Cardiovascular: Negative.   Gastrointestinal: Negative.   Genitourinary: Negative.   Musculoskeletal: Negative.   Skin:  Positive for rash.  Neurological: Negative.   Endo/Heme/Allergies: Negative.   Psychiatric/Behavioral: Negative.      Allergies  Allergen Reactions  Acetaminophen  Nausea And Vomiting    OBJECTIVE: Vitals:   08/18/24 2319 08/19/24 0538 08/19/24 0740 08/19/24 1107  BP: (!) 157/82 (!) 149/76 (!) 150/87 (!) 140/80  Pulse: 83 81 90 93  Resp: 19 20 20 18   Temp: 98.9 F (37.2 C) 98.3 F (36.8 C) 98.4 F  (36.9 C) 98.3 F (36.8 C)  TempSrc: Oral Oral Oral Oral  SpO2: 95% 93% 93% 95%  Weight:      Height:       Body mass index is 31.66 kg/m.  Physical Exam Constitutional:      Appearance: He is ill-appearing.  HENT:     Head: Normocephalic and atraumatic.     Right Ear: External ear normal.     Left Ear: External ear normal.     Mouth/Throat:     Mouth: Mucous membranes are moist.  Eyes:     Conjunctiva/sclera: Conjunctivae normal.  Cardiovascular:     Rate and Rhythm: Normal rate.  Pulmonary:     Effort: Pulmonary effort is normal.  Abdominal:     General: Bowel sounds are normal.     Palpations: Abdomen is soft.  Musculoskeletal:        General: Swelling present.     Right lower leg: No edema.     Left lower leg: Edema present.  Skin:    Findings: Lesion present.  Neurological:     Mental Status: He is alert and oriented to person, place, and time. Mental status is at baseline.  Psychiatric:        Mood and Affect: Mood normal.        Behavior: Behavior normal.        Thought Content: Thought content normal.        Judgment: Judgment normal.     Lab Results Lab Results  Component Value Date   WBC 9.3 08/18/2024   HGB 12.6 (L) 08/18/2024   HCT 42.9 08/18/2024   MCV 82.2 08/18/2024   PLT 348 08/18/2024    Lab Results  Component Value Date   CREATININE 1.09 08/18/2024   BUN 10 08/18/2024   NA 133 (L) 08/18/2024   K 3.8 08/18/2024   CL 97 (L) 08/18/2024   CO2 26 08/18/2024    Lab Results  Component Value Date   ALT 11 08/17/2024   AST 12 (L) 08/17/2024   ALKPHOS 170 (H) 08/17/2024   BILITOT 0.6 08/17/2024     Microbiology: Recent Results (from the past 240 hours)  Blood culture (routine x 2)     Status: Abnormal   Collection Time: 08/17/24  1:00 AM   Specimen: BLOOD  Result Value Ref Range Status   Specimen Description BLOOD SITE NOT SPECIFIED  Final   Special Requests   Final    BOTTLES DRAWN AEROBIC AND ANAEROBIC Blood Culture adequate  volume   Culture  Setup Time   Final    GRAM POSITIVE COCCI IN BOTH AEROBIC AND ANAEROBIC BOTTLES CRITICAL VALUE NOTED.  VALUE IS CONSISTENT WITH PREVIOUSLY REPORTED AND CALLED VALUE.    Culture (A)  Final    STAPHYLOCOCCUS AUREUS SUSCEPTIBILITIES PERFORMED ON PREVIOUS CULTURE WITHIN THE LAST 5 DAYS. Performed at [facility], [address], [place], [place] [address]    Report Status 08/19/2024 FINAL  Final  Blood culture (routine x 2)     Status: Abnormal (Preliminary result)   Collection Time: 08/17/24  1:05 AM   Specimen: BLOOD  Result Value Ref Range Status   Specimen Description  BLOOD SITE NOT SPECIFIED  Final   Special Requests   Final    BOTTLES DRAWN AEROBIC AND ANAEROBIC Blood Culture adequate volume   Culture  Setup Time   Final    GRAM POSITIVE COCCI IN CLUSTERS IN BOTH AEROBIC AND ANAEROBIC BOTTLES CRITICAL RESULT CALLED TO, READ BACK BY AND VERIFIED WITH: PHARMD AUSTIN PAYTES ON 08/17/24 @ 1904 BY DRT    Culture (A)  Final    METHICILLIN RESISTANT STAPHYLOCOCCUS AUREUS Sent to [facility] for further susceptibility testing. Performed at [facility], [address], [place], [place] [address]    Report Status PENDING  Incomplete   Organism ID, Bacteria METHICILLIN RESISTANT STAPHYLOCOCCUS AUREUS  Final      Susceptibility   Methicillin resistant staphylococcus aureus - MIC*    CIPROFLOXACIN >=8 RESISTANT Resistant     ERYTHROMYCIN >=8 RESISTANT Resistant     GENTAMICIN <=0.5 SENSITIVE Sensitive     OXACILLIN >=4 RESISTANT Resistant     TETRACYCLINE <=1 SENSITIVE Sensitive     VANCOMYCIN  <=0.5 SENSITIVE Sensitive     TRIMETH/SULFA 80 RESISTANT Resistant     CLINDAMYCIN <=0.25 SENSITIVE Sensitive     RIFAMPIN <=0.5 SENSITIVE Sensitive     Inducible Clindamycin NEGATIVE Sensitive     LINEZOLID 2 SENSITIVE Sensitive     * METHICILLIN RESISTANT STAPHYLOCOCCUS AUREUS  Blood Culture ID Panel (Reflexed)     Status: Abnormal   Collection Time: 08/17/24  1:05  AM  Result Value Ref Range Status   Enterococcus faecalis NOT DETECTED NOT DETECTED Final   Enterococcus Faecium NOT DETECTED NOT DETECTED Final   Listeria monocytogenes NOT DETECTED NOT DETECTED Final   Staphylococcus species DETECTED (A) NOT DETECTED Final    Comment: CRITICAL RESULT CALLED TO, READ BACK BY AND VERIFIED WITH: PHARMD AUSTIN PAYTES ON 08/17/24 @ 1904 BY DRT    Staphylococcus aureus (BCID) DETECTED (A) NOT DETECTED Final    Comment: Methicillin (oxacillin)-resistant Staphylococcus aureus (MRSA). MRSA is predictably resistant to beta-lactam antibiotics (except ceftaroline). Preferred therapy is vancomycin  unless clinically contraindicated. Patient requires contact precautions if  hospitalized. CRITICAL RESULT CALLED TO, READ BACK BY AND VERIFIED WITH: PHARMD AUSTIN PAYTES ON 08/17/24 @ 1904 BY DRT    Staphylococcus epidermidis NOT DETECTED NOT DETECTED Final   Staphylococcus lugdunensis NOT DETECTED NOT DETECTED Final   Streptococcus species NOT DETECTED NOT DETECTED Final   Streptococcus agalactiae NOT DETECTED NOT DETECTED Final   Streptococcus pneumoniae NOT DETECTED NOT DETECTED Final   Streptococcus pyogenes NOT DETECTED NOT DETECTED Final   A.calcoaceticus-baumannii NOT DETECTED NOT DETECTED Final   Bacteroides fragilis NOT DETECTED NOT DETECTED Final   Enterobacterales NOT DETECTED NOT DETECTED Final   Enterobacter cloacae complex NOT DETECTED NOT DETECTED Final   Escherichia coli NOT DETECTED NOT DETECTED Final   Klebsiella aerogenes NOT DETECTED NOT DETECTED Final   Klebsiella oxytoca NOT DETECTED NOT DETECTED Final   Klebsiella pneumoniae NOT DETECTED NOT DETECTED Final   Proteus species NOT DETECTED NOT DETECTED Final   Salmonella species NOT DETECTED NOT DETECTED Final   Serratia marcescens NOT DETECTED NOT DETECTED Final   Haemophilus influenzae NOT DETECTED NOT DETECTED Final   Neisseria meningitidis NOT DETECTED NOT DETECTED Final   Pseudomonas aeruginosa  NOT DETECTED NOT DETECTED Final   Stenotrophomonas maltophilia NOT DETECTED NOT DETECTED Final   Candida albicans NOT DETECTED NOT DETECTED Final   Candida auris NOT DETECTED NOT DETECTED Final   Candida glabrata NOT DETECTED NOT DETECTED Final   Candida krusei NOT  DETECTED NOT DETECTED Final   Candida parapsilosis NOT DETECTED NOT DETECTED Final   Candida tropicalis NOT DETECTED NOT DETECTED Final   Cryptococcus neoformans/gattii NOT DETECTED NOT DETECTED Final   Meth resistant mecA/C and MREJ DETECTED (A) NOT DETECTED Final    Comment: CRITICAL RESULT CALLED TO, READ BACK BY AND VERIFIED WITH: PHARMD AUSTIN PAYTES ON 08/17/24 @ 1904 BY DRT Performed at [facility], [address], [place], [place] [address]     Dr. Zoanne, ID [facility] Cell phone-[phone].  @TODAY @ 3:02 PM   Total Encounter Time: 50 minutes.

## 2024-08-20 DIAGNOSIS — L03116 Cellulitis of left lower limb: Secondary | ICD-10-CM | POA: Diagnosis not present

## 2024-08-20 DIAGNOSIS — M86172 Other acute osteomyelitis, left ankle and foot: Secondary | ICD-10-CM | POA: Diagnosis not present

## 2024-08-20 DIAGNOSIS — R7881 Bacteremia: Secondary | ICD-10-CM

## 2024-08-20 DIAGNOSIS — B9562 Methicillin resistant Staphylococcus aureus infection as the cause of diseases classified elsewhere: Secondary | ICD-10-CM

## 2024-08-20 DIAGNOSIS — L97529 Non-pressure chronic ulcer of other part of left foot with unspecified severity: Secondary | ICD-10-CM | POA: Diagnosis not present

## 2024-08-20 LAB — MAGNESIUM: Magnesium: 1.6 mg/dL — ABNORMAL LOW (ref 1.7–2.4)

## 2024-08-20 LAB — BASIC METABOLIC PANEL WITH GFR
Anion gap: 9 (ref 5–15)
BUN: 10 mg/dL (ref 6–20)
CO2: 27 mmol/L (ref 22–32)
Calcium: 8.2 mg/dL — ABNORMAL LOW (ref 8.9–10.3)
Chloride: 99 mmol/L (ref 98–111)
Creatinine, Ser: 1.05 mg/dL (ref 0.61–1.24)
GFR, Estimated: >60 mL/min (ref >60)
Glucose, Bld: 234 mg/dL — ABNORMAL HIGH (ref 70–99)
Potassium: 4.1 mmol/L (ref 3.5–5.1)
Sodium: 135 mmol/L (ref 135–145)

## 2024-08-20 LAB — GLUCOSE, CAPILLARY
Glucose-Capillary: 245 mg/dL — ABNORMAL HIGH (ref 70–99)
Glucose-Capillary: 312 mg/dL — ABNORMAL HIGH (ref 70–99)
Glucose-Capillary: 324 mg/dL — ABNORMAL HIGH (ref 70–99)
Glucose-Capillary: 304 mg/dL — ABNORMAL HIGH (ref 70–99)

## 2024-08-20 LAB — CBC
HCT: 40.6 % (ref 39.0–52.0)
Hemoglobin: 12 g/dL — ABNORMAL LOW (ref 13.0–17.0)
MCH: 23.8 pg — ABNORMAL LOW (ref 26.0–34.0)
MCHC: 29.6 g/dL — ABNORMAL LOW (ref 30.0–36.0)
MCV: 80.4 fL (ref 80.0–100.0)
Platelets: 317 K/uL (ref 150–400)
RBC: 5.05 MIL/uL (ref 4.22–5.81)
RDW: 18.5 % — ABNORMAL HIGH (ref 11.5–15.5)
WBC: 7.5 K/uL (ref 4.0–10.5)
nRBC: 0 % (ref 0.0–0.2)

## 2024-08-20 LAB — PHOSPHORUS: Phosphorus: 3.7 mg/dL (ref 2.5–4.6)

## 2024-08-20 MED ORDER — METRONIDAZOLE 500 MG PO TABS
500.0000 mg | ORAL_TABLET | Freq: Two times a day (BID) | ORAL | Status: DC
Start: 2024-08-20 — End: 2024-08-23
  Administered 2024-08-20 – 2024-08-23 (×7): 500 mg via ORAL
  Filled 2024-08-20 (×7): qty 1

## 2024-08-20 MED ORDER — LEVOFLOXACIN 750 MG PO TABS
750.0000 mg | ORAL_TABLET | Freq: Every day | ORAL | Status: DC
  Administered 2024-08-20 – 2024-08-23 (×4): 750 mg via ORAL
  Filled 2024-08-20 (×4): qty 1

## 2024-08-20 MED ORDER — MAGNESIUM SULFATE 2 GM/50ML IV SOLN
2.0000 g | Freq: Once | INTRAVENOUS | Status: AC
  Administered 2024-08-20: 2 g via INTRAVENOUS
  Filled 2024-08-20: qty 50

## 2024-08-20 NOTE — Plan of Care (Signed)
  Problem: Fluid Volume: Goal: Ability to maintain a balanced intake and output will improve Outcome: Progressing   Problem: Metabolic: Goal: Ability to maintain appropriate glucose levels will improve Outcome: Progressing   Problem: Nutritional: Goal: Maintenance of adequate nutrition will improve Outcome: Progressing   Problem: Skin Integrity: Goal: Risk for impaired skin integrity will decrease Outcome: Progressing   Problem: Tissue Perfusion: Goal: Adequacy of tissue perfusion will improve Outcome: Progressing   Problem: Clinical Measurements: Goal: Ability to maintain clinical measurements within normal limits will improve Outcome: Progressing Goal: Diagnostic test results will improve Outcome: Progressing Goal: Respiratory complications will improve Outcome: Progressing Goal: Cardiovascular complication will be avoided Outcome: Progressing   Problem: Activity: Goal: Risk for activity intolerance will decrease Outcome: Progressing   Problem: Nutrition: Goal: Adequate nutrition will be maintained Outcome: Progressing   Problem: Safety: Goal: Ability to remain free from injury will improve Outcome: Progressing

## 2024-08-20 NOTE — Progress Notes (Signed)
 [facility]    Date of Admission:  08/16/2024   Total days of antibiotics 4 of dapto/piptaz   ID: Ryan [name] is a 58 y.o. male with left foot drainage, hx of right bka, poorly controlled T2DM, smoker. On admit 5th MTP OM noted on imaging in the setting of MRSA bacteremia Principal Problem:   Cellulitis of left lower extremity Active Problems:   Uncontrolled type 2 diabetes mellitus with hyperglycemia, without long-term current use of insulin  (HCC)   S/P BKA (below knee amputation) unilateral, right (HCC)   Tobacco use disorder   Chronic prescription opiate use - sees pain clinic in [place], [place].   GERD (gastroesophageal reflux disease)   CHF (congestive heart failure) (HCC)   COPD (chronic obstructive pulmonary disease) (HCC)   Osteomyelitis (HCC)    Subjective: Afebrile; still some drainage from left foot  Medications:   enoxaparin (LOVENOX) injection  50 mg Subcutaneous Daily   gabapentin   300 mg Oral TID   insulin  aspart  0-15 Units Subcutaneous TID WC   insulin  aspart  0-5 Units Subcutaneous QHS   insulin  glargine  15 Units Subcutaneous Daily   irbesartan  150 mg Oral Daily    Objective: Vital signs in last 24 hours: Temp:  [98.3 F (36.8 C)-99.6 F (37.6 C)] 98.4 F (36.9 C) (10/11 0810) Pulse Rate:  [79-95] 92 (10/11 0810) Resp:  [18-22] 22 (10/11 0814) BP: (137-160)/(68-85) 138/69 (10/11 0810) SpO2:  [93 %-99 %] 93 % (10/11 0402) Weight:  [99.7 kg] 99.7 kg (10/11 0645)   Physical Exam  Constitutional: He is oriented to person, place, and time. He appears well-developed and well-nourished. No distress.  HENT:  Mouth/Throat: Oropharynx is clear and moist. No oropharyngeal exudate.  Cardiovascular: Normal rate, regular rhythm and normal heart sounds. Exam reveals no gallop and no friction rub.  No murmur heard.  Pulmonary/Chest: Effort normal and breath sounds normal. No respiratory distress. He has no wheezes.  Abdominal:  Soft. Bowel sounds are normal. He exhibits no distension. There is no tenderness.  Lymphadenopathy:  He has no cervical adenopathy.  Neurological: He is alert and oriented to person, place, and time.  Skin: See below Psychiatric: He has a normal mood and affect. His behavior is normal.    Media Information  Document Information     Lab Results Recent Labs    08/18/24 0640 08/20/24 0412  WBC 9.3 7.5  HGB 12.6* 12.0*  HCT 42.9 40.6  NA 133* 135  K 3.8 4.1  CL 97* 99  CO2 26 27  BUN 10 10  CREATININE 1.09 1.05   Lab Results  Component Value Date   ESRSEDRATE 55 (H) 08/17/2024   Lab Results  Component Value Date   CRP 11.0 (H) 08/17/2024     Microbiology:bcx 10/8 in 4/4 Methicillin resistant staphylococcus aureus      MIC    CIPROFLOXACIN >=8 RESISTANT Resistant    CLINDAMYCIN <=0.25 SENS... Sensitive    ERYTHROMYCIN >=8 RESISTANT Resistant    GENTAMICIN <=0.5 SENSI... Sensitive    Inducible Clindamycin NEGATIVE Sensitive    LINEZOLID 2 SENSITIVE Sensitive    OXACILLIN >=4 RESISTANT Resistant    RIFAMPIN <=0.5 SENSI... Sensitive    TETRACYCLINE <=1 SENSITIVE Sensitive    TRIMETH/SULFA 80 RESISTANT Resistant    VANCOMYCIN  <=0.5 SENSI... Sensitive   Blood cx 10/10- NGTD  Studies/Results: ECHOCARDIOGRAM COMPLETE Result Date: 08/19/2024    ECHOCARDIOGRAM REPORT   Patient Name:   Ryan [name] Date  of Exam: 08/19/2024 Medical Rec #:  [id number]        Height:       73.0 in Accession #:    [id number]       Weight:       240.0 lb Date of Birth:  08/13/1966        BSA:          2.325 m Patient Age:    58 years         BP:           140/80 mmHg Patient Gender: M                HR:           89 bpm. Exam Location:  Inpatient Procedure: 2D Echo (Both Spectral and Color Flow Doppler were utilized during            procedure). Indications:    Bacteremia  History:        Patient has prior history of Echocardiogram examinations.  Sonographer:    Charmaine Gaskins Referring Phys:  [id number] GREGORY D CALONE  Sonographer Comments: Image acquisition challenging due to uncooperative patient. IMPRESSIONS  1. Left ventricular ejection fraction, by estimation, is 60 to 65%. The left ventricle has normal function. The left ventricle has no regional wall motion abnormalities. Left ventricular diastolic parameters are indeterminate.  2. Right ventricular systolic function is normal. The right ventricular size is normal. There is moderately elevated pulmonary artery systolic pressure. The estimated right ventricular systolic pressure is 58.8 mmHg.  3. The mitral valve is normal in structure. No evidence of mitral valve regurgitation. No evidence of mitral stenosis.  4. Tricuspid valve regurgitation is moderate.  5. The aortic valve has an indeterminant number of cusps. There is mild calcification of the aortic valve. Aortic valve regurgitation is not visualized. Aortic valve sclerosis is present, with no evidence of aortic valve stenosis.  6. The inferior vena cava is dilated in size with <50% respiratory variability, suggesting right atrial pressure of 15 mmHg. Comparison(s): No significant change from prior study. Conclusion(s)/Recommendation(s): No evidence of valvular vegetations on this transthoracic echocardiogram. Consider a transesophageal echocardiogram to exclude infective endocarditis if clinically indicated. Moderate pulmonary hypertension. FINDINGS  Left Ventricle: Left ventricular ejection fraction, by estimation, is 60 to 65%. The left ventricle has normal function. The left ventricle has no regional wall motion abnormalities. The left ventricular internal cavity size was normal in size. There is  no left ventricular hypertrophy. Left ventricular diastolic parameters are indeterminate. Right Ventricle: The right ventricular size is normal. No increase in right ventricular wall thickness. Right ventricular systolic function is normal. There is moderately elevated pulmonary artery systolic  pressure. The tricuspid regurgitant velocity is 3.31 m/s, and with an assumed right atrial pressure of 15 mmHg, the estimated right ventricular systolic pressure is 58.8 mmHg. Left [facility]: Left atrial size was normal in size. Right [facility]: Right atrial size was normal in size. Pericardium: There is no evidence of pericardial effusion. Mitral Valve: The mitral valve is normal in structure. Mild to moderate mitral annular calcification. No evidence of mitral valve regurgitation. No evidence of mitral valve stenosis. Tricuspid Valve: The tricuspid valve is normal in structure. Tricuspid valve regurgitation is moderate . No evidence of tricuspid stenosis. Aortic Valve: Possible fusion of NCC-LCC, incompletely visualized. The aortic valve has an indeterminant number of cusps. There is mild calcification of the aortic valve. Aortic valve regurgitation is not visualized. Aortic valve sclerosis is present, with  no evidence of aortic valve stenosis. Aortic valve mean gradient measures 6.0 mmHg. Aortic valve peak gradient measures 9.2 mmHg. Pulmonic Valve: The pulmonic valve was grossly normal. Pulmonic valve regurgitation is not visualized. No evidence of pulmonic stenosis. Aorta: The aortic root and ascending aorta are structurally normal, with no evidence of dilitation. Venous: The inferior vena cava is dilated in size with less than 50% respiratory variability, suggesting right atrial pressure of 15 mmHg. IAS/Shunts: The atrial septum is grossly normal.  LEFT VENTRICLE PLAX 2D LVIDd:         4.12 cm     Diastology LVIDs:         2.19 cm     LV e' medial:    6.20 cm/s LV PW:         0.91 cm     LV E/e' medial:  15.5 LV IVS:        0.87 cm     LV e' lateral:   6.64 cm/s LVOT diam:     2.20 cm     LV E/e' lateral: 14.5 LVOT Area:     3.80 cm  LV Volumes (MOD) LV vol d, MOD A4C: 72.2 ml LV vol s, MOD A4C: 25.2 ml LV SV MOD A4C:     72.2 ml RIGHT VENTRICLE RV Basal diam:  2.93 cm RV Mid diam:    2.79 cm RV S prime:     11.00  cm/s LEFT ATRIUM             Index        RIGHT ATRIUM           Index LA diam:        3.99 cm 1.72 cm/m   RA Area:     13.70 cm LA Vol (A2C):   74.0 ml 31.83 ml/m  RA Volume:   30.00 ml  12.90 ml/m LA Vol (A4C):   52.3 ml 22.49 ml/m LA Biplane Vol: 62.5 ml 26.88 ml/m  AORTIC VALVE AV Vmax:      152.00 cm/s AV Vmean:     114.000 cm/s AV VTI:       0.311 m AV Peak Grad: 9.2 mmHg AV Mean Grad: 6.0 mmHg  AORTA Ao Root diam: 3.58 cm Ao Asc diam:  3.66 cm MITRAL VALVE                TRICUSPID VALVE MV Area (PHT): 3.81 cm     TR Peak grad:   43.8 mmHg MV Decel Time: 199 msec     TR Vmax:        331.00 cm/s MV E velocity: 96.30 cm/s MV A velocity: 115.00 cm/s  SHUNTS MV E/A ratio:  0.84         Systemic Diam: 2.20 cm Shelda Bruckner MD Electronically signed by Shelda Bruckner MD Signature Date/Time: 08/19/2024/5:00:36 PM    Final      Assessment/Plan: MRSA bacteremia in the setting of left foot osteomyelitis = plan on continue on daptomycin at 8mg /kg/day. TTE negative but would like to get TEE. If TEE is negative, plan to treat with 2 wk of daptomycin then convert to every 2 wks of dalbavancin vs. Oral regimen   Left foot osteomyelitis = patient not interested in amputation at this point, would like medical management, currently on daptomycin. Will add levo plus oral metronidazole and stop piptazo. I imagine that iv abtx will not be sufficient in curing his infection- which I have mentioned to patient. Agree with  dr duda's plan to see back on Monday to reassess  Ulcerative wound = recommend medihoney to help with debridement with daily dressing changes.  Continue with wound care at [facility] upon discharge. Still ultimately may need amputation if abtx are unsuccessful.  Poorly controlled T2DM with hemoglobin A1c 13= unlikely to have goo healing given his hemoglobin A1c. Needs better DM management per primary team.  Will see back on Monday  evaluation of this patient requires complex  antimicrobial therapy evaluation and counseling and isolation needs for disease transmission risk assessment and mitigation.    [facility] Pager: [phone]  08/20/2024, 9:23 AM

## 2024-08-20 NOTE — Progress Notes (Signed)
 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  Patient reports worsening left foot redness pain swelling and purulent drainage for last 2 months.  He has been following up with [facility].  Patient was sent in the ED for further evaluation from the wound center yesterday.  Left foot x-ray shows left fifth metatarsal joint changes consistent with osteomyelitis.  Patient was admitted and orthopedics was consulted,  started on empiric antibiotics.  Assessment & Plan:   Principal Problem:   Cellulitis of left lower extremity Active Problems:   Osteomyelitis (HCC)   Uncontrolled type 2 diabetes mellitus with hyperglycemia, without long-term current use of insulin  (HCC)   S/P BKA (below knee amputation) unilateral, right (HCC)   Tobacco use disorder   Chronic prescription opiate use - sees pain clinic in [place], [place].   GERD (gastroesophageal reflux disease)   CHF (congestive heart failure) (HCC)   COPD (chronic obstructive pulmonary disease) (HCC)   Osteomyelitis (HCC) Diabetic foot infection: MRSA bacteremia: Patient presented with worsening L foot purulent drainage, swelling and pain x 2 months  L foot plain films concerning for osteomyelitis.  Blood cultures positive for MRSA Initiated on IV zyvox and zosyn for infectious coverage , Antibiotics changed to IV daptomycin and Zosyn by ID. ESR/ CRP elevated. MRI foot : Findings compatible with osteomyelitis/septic arthritis of the underlying fifth proximal phalanx and mid to distal fifth metatarsal. Podiatry consulted.  Dr. Harden recommended transtibial amputation, but patient not agreeable at this point. Continue IV antibiotics  broad-spectrum and Dr. Harden will reassess on Monday. ID recommended TEE, Cardiology consulted and scheduled tentatively for Tuesday   Uncontrolled type 2 diabetes mellitus with hyperglycemia: Blood sugars 500s on presentation without overt DKA or HHS  BHB WNL , HbA1c 13.2  Continue long acting insulin  and SSI. Diabetic education . Discussed importance of blood sugar management   COPD (chronic obstructive pulmonary disease) (HCC) Stable from a resp standpoint. Continue home inhalers    CHF (congestive heart failure) (HCC) 2D ECHO 05/2024 w/ EF 50-55%  Appears euvolemic at present. Weight today 109 kg  Monitor volume status with treatment.    GERD (gastroesophageal reflux disease) Continue PPI.    Chronic prescription opiate use - sees pain clinic in [place], [place]. On oxycodone  15mg  q4 hours prn  Will continue in setting of active diabetic foot infection.   Tobacco use disorder: Discussed cessation at length.  Nicotine patch.    S/P BKA (below knee amputation) unilateral, right (HCC) Previous R BKA in setting of PVD, tobacco abuse and poorly controlled t2dm.    DVT prophylaxis: SCDs Code Status: Full code Family Communication: No family at bedside. Disposition Plan:  Status is: Inpatient Remains inpatient appropriate because: Severity of illness.    Consultants:  Podiatry Infectious diseases  Procedures:  Antimicrobials:  Anti-infectives (From admission, onward)    Start     Dose/Rate Route Frequency Ordered Stop   08/18/24 1600  DAPTOmycin (CUBICIN) IVPB 700 mg/100mL premix  Status:  Discontinued        6 mg/kg  108.9 kg 200 mL/hr over 30 Minutes Intravenous Daily 08/18/24 1519 08/18/24 1529   08/18/24 1600  DAPTOmycin (CUBICIN) IVPB 700 mg/156mL premix  8 mg/kg  91.5 kg (Adjusted) 200 mL/hr over 30 Minutes Intravenous Daily 08/18/24 1529     08/18/24 0930  vancomycin  (VANCOCIN ) IVPB 1000 mg/200 mL premix  Status:  Discontinued       Placed in  Followed by Linked Group   1,000 mg 200 mL/hr over 60 Minutes Intravenous Every 12 hours 08/18/24 0916 08/18/24 1519   08/18/24 0715  vancomycin  (VANCOREADY) IVPB 1250 mg/250 mL  Status:  Discontinued       Placed in Followed by Linked Group   1,250 mg 166.7 mL/hr over 90 Minutes Intravenous Every 12 hours 08/17/24 1914 08/18/24 0916   08/17/24 1915  vancomycin  (VANCOREADY) IVPB 2000 mg/400 mL       Placed in Followed by Linked Group   2,000 mg 200 mL/hr over 120 Minutes Intravenous  Once 08/17/24 1914 08/17/24 2303   08/17/24 1000  linezolid (ZYVOX) IVPB 600 mg  Status:  Discontinued        600 mg 300 mL/hr over 60 Minutes Intravenous Every 12 hours 08/17/24 0228 08/17/24 1914   08/17/24 1000  piperacillin-tazobactam (ZOSYN) IVPB 3.375 g        3.375 g 12.5 mL/hr over 240 Minutes Intravenous Every 8 hours 08/17/24 0303     08/17/24 0200  piperacillin-tazobactam (ZOSYN) IVPB 3.375 g        3.375 g 100 mL/hr over 30 Minutes Intravenous  Once 08/17/24 0158 08/17/24 0216   08/17/24 0200  linezolid (ZYVOX) IVPB 600 mg        600 mg 300 mL/hr over 60 Minutes Intravenous  Once 08/17/24 0158 08/17/24 0415      Subjective: Patient was seen and examined at bedside. Overnight events noted. Patient is not sure whether he wants amputation or not. Patient does not seem to understand the severity of his clinical condition. Patient needs TEE as per ID.  Objective: Vitals:   08/20/24 0402 08/20/24 0645 08/20/24 0810 08/20/24 0814  BP: (!) 155/72  138/69   Pulse: 79  92   Resp: 18  20 (!) 22  Temp: 99 F (37.2 C)  98.4 F (36.9 C)   TempSrc: Oral  Oral   SpO2: 93%     Weight:  99.7 kg    Height:        Intake/Output Summary (Last 24 hours) at 08/20/2024 1057 Last data filed at 08/20/2024 0651 Gross per 24 hour  Intake 246.8 ml  Output 2350 ml  Net -2103.2 ml   Filed Weights   08/16/24 2208 08/20/24 0645  Weight: 108.9 kg 99.7 kg    Examination:  General exam: Appears  calm and comfortable, not in any acute distress. Respiratory system: CTA Bilaterally . Respiratory effort normal.  RR 14 Cardiovascular system: S1 & S2 heard, RRR. No JVD, murmurs, rubs, gallops or clicks.  Gastrointestinal system: Abdomen is non distended, soft and nontender.  Normal bowel sounds heard. Central nervous system: Alert and oriented x 3. No focal neurological deficits. Extremities: Right BKA, left foot infection. Skin: No rashes, lesions or ulcers Psychiatry: Judgement and insight appear normal. Mood & affect appropriate.   Data Reviewed: I have personally reviewed following labs and imaging studies  CBC: Recent Labs  Lab 08/16/24 2223 08/16/24 2229 08/16/24 2252 08/17/24 0346 08/18/24 0640 08/20/24 0412  WBC 10.7*  --   --  10.0 9.3 7.5  NEUTROABS  --   --   --  7.8*  --   --   HGB 13.3 16.7 15.3 11.6* 12.6* 12.0*  HCT 45.8 49.0 45.0 39.1 42.9 40.6  MCV 81.5  --   --  80.8 82.2 80.4  PLT 353  --   --  333 348 317   Basic Metabolic Panel: Recent Labs  Lab 08/16/24 2229 08/16/24 2231 08/16/24 2252 08/17/24 0346 08/18/24 0640 08/20/24 0412  NA 134* 130* 134* 133* 133* 135  K 3.8 3.6 3.6 3.4* 3.8 4.1  CL 94* 91*  --  97* 97* 99  CO2  --  28  --  27 26 27   GLUCOSE 573* 579*  --  352* 174* 234*  BUN 12 11  --  9 10 10   CREATININE 1.20 1.27*  --  1.08 1.09 1.05  CALCIUM  --  8.2*  --  7.9* 8.3* 8.2*  MG  --   --   --  1.6* 1.7 1.6*  PHOS  --   --   --   --   --  3.7   GFR: Estimated Creatinine Clearance: 95.2 mL/min (by C-G formula based on SCr of 1.05 mg/dL). Liver Function Tests: Recent Labs  Lab 08/16/24 2231 08/17/24 0346  AST 16 12*  ALT 14 11  ALKPHOS 201* 170*  BILITOT 0.7 0.6  PROT 7.5 6.8  ALBUMIN 1.6* <1.5*   No results for input(s): LIPASE, AMYLASE in the last 168 hours. No results for input(s): AMMONIA in the last 168 hours. Coagulation Profile: Recent Labs  Lab 08/17/24 0346  INR 1.3*   Cardiac Enzymes: Recent Labs  Lab  08/19/24 0933  CKTOTAL 57   BNP (last 3 results) No results for input(s): PROBNP in the last 8760 hours. HbA1C: No results for input(s): HGBA1C in the last 72 hours.  CBG: Recent Labs  Lab 08/19/24 0803 08/19/24 1112 08/19/24 1626 08/19/24 2029 08/20/24 0637  GLUCAP 185* 261* 290* 343* 245*   Lipid Profile: No results for input(s): CHOL, HDL, LDLCALC, TRIG, CHOLHDL, LDLDIRECT in the last 72 hours. Thyroid Function Tests: No results for input(s): TSH, T4TOTAL, FREET4, T3FREE, THYROIDAB in the last 72 hours. Anemia Panel: No results for input(s): VITAMINB12, FOLATE, FERRITIN, TIBC, IRON, RETICCTPCT in the last 72 hours. Sepsis Labs: No results for input(s): PROCALCITON, LATICACIDVEN in the last 168 hours.  Recent Results (from the past 240 hours)  Blood culture (routine x 2)     Status: Abnormal   Collection Time: 08/17/24  1:00 AM   Specimen: BLOOD  Result Value Ref Range Status   Specimen Description BLOOD SITE NOT SPECIFIED  Final   Special Requests   Final    BOTTLES DRAWN AEROBIC AND ANAEROBIC Blood Culture adequate volume   Culture  Setup Time   Final    GRAM POSITIVE COCCI IN BOTH AEROBIC AND ANAEROBIC BOTTLES CRITICAL VALUE NOTED.  VALUE IS CONSISTENT WITH PREVIOUSLY REPORTED AND CALLED VALUE.    Culture (A)  Final    STAPHYLOCOCCUS AUREUS SUSCEPTIBILITIES PERFORMED ON PREVIOUS CULTURE WITHIN THE LAST 5 DAYS. Performed at [facility], [address] [place], [place] [address]    Report Status 08/19/2024 FINAL  Final  Blood culture (routine x 2)     Status: Abnormal (Preliminary result)   Collection Time: 08/17/24  1:05 AM   Specimen: BLOOD  Result Value Ref Range Status   Specimen Description BLOOD SITE NOT SPECIFIED  Final   Special Requests   Final    BOTTLES DRAWN AEROBIC AND ANAEROBIC Blood Culture adequate volume   Culture  Setup Time   Final    GRAM POSITIVE  COCCI IN CLUSTERS IN BOTH AEROBIC AND  ANAEROBIC BOTTLES CRITICAL RESULT CALLED TO, READ BACK BY AND VERIFIED WITH: PHARMD AUSTIN PAYTES ON 08/17/24 @ 1904 BY DRT    Culture (A)  Final    METHICILLIN RESISTANT STAPHYLOCOCCUS AUREUS Sent to [facility] for further susceptibility testing. Performed at [facility], [address] [place], [place] [address]    Report Status PENDING  Incomplete   Organism ID, Bacteria METHICILLIN RESISTANT STAPHYLOCOCCUS AUREUS  Final      Susceptibility   Methicillin resistant staphylococcus aureus - MIC*    CIPROFLOXACIN >=8 RESISTANT Resistant     ERYTHROMYCIN >=8 RESISTANT Resistant     GENTAMICIN <=0.5 SENSITIVE Sensitive     OXACILLIN >=4 RESISTANT Resistant     TETRACYCLINE <=1 SENSITIVE Sensitive     VANCOMYCIN  <=0.5 SENSITIVE Sensitive     TRIMETH/SULFA 80 RESISTANT Resistant     CLINDAMYCIN <=0.25 SENSITIVE Sensitive     RIFAMPIN <=0.5 SENSITIVE Sensitive     Inducible Clindamycin NEGATIVE Sensitive     LINEZOLID 2 SENSITIVE Sensitive     * METHICILLIN RESISTANT STAPHYLOCOCCUS AUREUS  Blood Culture ID Panel (Reflexed)     Status: Abnormal   Collection Time: 08/17/24  1:05 AM  Result Value Ref Range Status   Enterococcus faecalis NOT DETECTED NOT DETECTED Final   Enterococcus Faecium NOT DETECTED NOT DETECTED Final   Listeria monocytogenes NOT DETECTED NOT DETECTED Final   Staphylococcus species DETECTED (A) NOT DETECTED Final    Comment: CRITICAL RESULT CALLED TO, READ BACK BY AND VERIFIED WITH: PHARMD AUSTIN PAYTES ON 08/17/24 @ 1904 BY DRT    Staphylococcus aureus (BCID) DETECTED (A) NOT DETECTED Final    Comment: Methicillin (oxacillin)-resistant Staphylococcus aureus (MRSA). MRSA is predictably resistant to beta-lactam antibiotics (except ceftaroline). Preferred therapy is vancomycin  unless clinically contraindicated. Patient requires contact precautions if  hospitalized. CRITICAL RESULT CALLED TO, READ BACK BY AND VERIFIED WITH: PHARMD AUSTIN PAYTES ON 08/17/24 @ 1904 BY  DRT    Staphylococcus epidermidis NOT DETECTED NOT DETECTED Final   Staphylococcus lugdunensis NOT DETECTED NOT DETECTED Final   Streptococcus species NOT DETECTED NOT DETECTED Final   Streptococcus agalactiae NOT DETECTED NOT DETECTED Final   Streptococcus pneumoniae NOT DETECTED NOT DETECTED Final   Streptococcus pyogenes NOT DETECTED NOT DETECTED Final   A.calcoaceticus-baumannii NOT DETECTED NOT DETECTED Final   Bacteroides fragilis NOT DETECTED NOT DETECTED Final   Enterobacterales NOT DETECTED NOT DETECTED Final   Enterobacter cloacae complex NOT DETECTED NOT DETECTED Final   Escherichia coli NOT DETECTED NOT DETECTED Final   Klebsiella aerogenes NOT DETECTED NOT DETECTED Final   Klebsiella oxytoca NOT DETECTED NOT DETECTED Final   Klebsiella pneumoniae NOT DETECTED NOT DETECTED Final   Proteus species NOT DETECTED NOT DETECTED Final   Salmonella species NOT DETECTED NOT DETECTED Final   Serratia marcescens NOT DETECTED NOT DETECTED Final   Haemophilus influenzae NOT DETECTED NOT DETECTED Final   Neisseria meningitidis NOT DETECTED NOT DETECTED Final   Pseudomonas aeruginosa NOT DETECTED NOT DETECTED Final   Stenotrophomonas maltophilia NOT DETECTED NOT DETECTED Final   Candida albicans NOT DETECTED NOT DETECTED Final   Candida auris NOT DETECTED NOT DETECTED Final   Candida glabrata NOT DETECTED NOT DETECTED Final   Candida krusei NOT DETECTED NOT DETECTED Final   Candida parapsilosis NOT DETECTED NOT DETECTED Final   Candida tropicalis NOT DETECTED NOT DETECTED Final   Cryptococcus neoformans/gattii NOT DETECTED NOT DETECTED Final   Meth resistant mecA/C and MREJ DETECTED (  A) NOT DETECTED Final    Comment: CRITICAL RESULT CALLED TO, READ BACK BY AND VERIFIED WITH: PHARMD AUSTIN PAYTES ON 08/17/24 @ 1904 BY DRT Performed at [facility], [address] [place], [place] [address]   Culture, blood (Routine X 2) w Reflex to ID Panel     Status: None (Preliminary result)    Collection Time: 08/19/24  9:33 AM   Specimen: BLOOD RIGHT ARM  Result Value Ref Range Status   Specimen Description BLOOD RIGHT ARM  Final   Special Requests   Final    BOTTLES DRAWN AEROBIC AND ANAEROBIC Blood Culture results may not be optimal due to an inadequate volume of blood received in culture bottles   Culture   Final    NO GROWTH < 24 HOURS Performed at [facility], [address] [place], [place] [address]    Report Status PENDING  Incomplete  Culture, blood (Routine X 2) w Reflex to ID Panel     Status: None (Preliminary result)   Collection Time: 08/19/24  9:33 AM   Specimen: BLOOD RIGHT HAND  Result Value Ref Range Status   Specimen Description BLOOD RIGHT HAND  Final   Special Requests   Final    BOTTLES DRAWN AEROBIC AND ANAEROBIC Blood Culture results may not be optimal due to an inadequate volume of blood received in culture bottles   Culture   Final    NO GROWTH < 24 HOURS Performed at [facility], [address] [place], [place] [address]    Report Status PENDING  Incomplete    Radiology Studies: ECHOCARDIOGRAM COMPLETE Result Date: 08/19/2024    ECHOCARDIOGRAM REPORT   Patient Name:   Ryan [name] Date of Exam: 08/19/2024 Medical Rec #:  [id number]        Height:       73.0 in Accession #:    [id number]       Weight:       240.0 lb Date of Birth:  July 12, 1966        BSA:          2.325 m Patient Age:    58 years         BP:           140/80 mmHg Patient Gender: M                HR:           89 bpm. Exam Location:  Inpatient Procedure: 2D Echo (Both Spectral and Color Flow Doppler were utilized during            procedure). Indications:    Bacteremia  History:        Patient has prior history of Echocardiogram examinations.  Sonographer:    Charmaine Gaskins Referring Phys: [id number] GREGORY D CALONE  Sonographer Comments: Image acquisition challenging due to uncooperative patient. IMPRESSIONS  1. Left ventricular ejection fraction, by estimation, is  60 to 65%. The left ventricle has normal function. The left ventricle has no regional wall motion abnormalities. Left ventricular diastolic parameters are indeterminate.  2. Right ventricular systolic function is normal. The right ventricular size is normal. There is moderately elevated pulmonary artery systolic pressure. The estimated right ventricular systolic pressure is 58.8 mmHg.  3. The mitral valve is normal in structure. No evidence of mitral valve regurgitation. No evidence of mitral stenosis.  4. Tricuspid valve regurgitation is moderate.  5. The aortic valve has an indeterminant  number of cusps. There is mild calcification of the aortic valve. Aortic valve regurgitation is not visualized. Aortic valve sclerosis is present, with no evidence of aortic valve stenosis.  6. The inferior vena cava is dilated in size with <50% respiratory variability, suggesting right atrial pressure of 15 mmHg. Comparison(s): No significant change from prior study. Conclusion(s)/Recommendation(s): No evidence of valvular vegetations on this transthoracic echocardiogram. Consider a transesophageal echocardiogram to exclude infective endocarditis if clinically indicated. Moderate pulmonary hypertension. FINDINGS  Left Ventricle: Left ventricular ejection fraction, by estimation, is 60 to 65%. The left ventricle has normal function. The left ventricle has no regional wall motion abnormalities. The left ventricular internal cavity size was normal in size. There is  no left ventricular hypertrophy. Left ventricular diastolic parameters are indeterminate. Right Ventricle: The right ventricular size is normal. No increase in right ventricular wall thickness. Right ventricular systolic function is normal. There is moderately elevated pulmonary artery systolic pressure. The tricuspid regurgitant velocity is 3.31 m/s, and with an assumed right atrial pressure of 15 mmHg, the estimated right ventricular systolic pressure is 58.8 mmHg.  Left [facility]: Left atrial size was normal in size. Right [facility]: Right atrial size was normal in size. Pericardium: There is no evidence of pericardial effusion. Mitral Valve: The mitral valve is normal in structure. Mild to moderate mitral annular calcification. No evidence of mitral valve regurgitation. No evidence of mitral valve stenosis. Tricuspid Valve: The tricuspid valve is normal in structure. Tricuspid valve regurgitation is moderate . No evidence of tricuspid stenosis. Aortic Valve: Possible fusion of NCC-LCC, incompletely visualized. The aortic valve has an indeterminant number of cusps. There is mild calcification of the aortic valve. Aortic valve regurgitation is not visualized. Aortic valve sclerosis is present, with no evidence of aortic valve stenosis. Aortic valve mean gradient measures 6.0 mmHg. Aortic valve peak gradient measures 9.2 mmHg. Pulmonic Valve: The pulmonic valve was grossly normal. Pulmonic valve regurgitation is not visualized. No evidence of pulmonic stenosis. Aorta: The aortic root and ascending aorta are structurally normal, with no evidence of dilitation. Venous: The inferior vena cava is dilated in size with less than 50% respiratory variability, suggesting right atrial pressure of 15 mmHg. IAS/Shunts: The atrial septum is grossly normal.  LEFT VENTRICLE PLAX 2D LVIDd:         4.12 cm     Diastology LVIDs:         2.19 cm     LV e' medial:    6.20 cm/s LV PW:         0.91 cm     LV E/e' medial:  15.5 LV IVS:        0.87 cm     LV e' lateral:   6.64 cm/s LVOT diam:     2.20 cm     LV E/e' lateral: 14.5 LVOT Area:     3.80 cm  LV Volumes (MOD) LV vol d, MOD A4C: 72.2 ml LV vol s, MOD A4C: 25.2 ml LV SV MOD A4C:     72.2 ml RIGHT VENTRICLE RV Basal diam:  2.93 cm RV Mid diam:    2.79 cm RV S prime:     11.00 cm/s LEFT ATRIUM             Index        RIGHT ATRIUM           Index LA diam:        3.99 cm 1.72 cm/m   RA Area:  13.70 cm LA Vol (A2C):   74.0 ml 31.83 ml/m  RA  Volume:   30.00 ml  12.90 ml/m LA Vol (A4C):   52.3 ml 22.49 ml/m LA Biplane Vol: 62.5 ml 26.88 ml/m  AORTIC VALVE AV Vmax:      152.00 cm/s AV Vmean:     114.000 cm/s AV VTI:       0.311 m AV Peak Grad: 9.2 mmHg AV Mean Grad: 6.0 mmHg  AORTA Ao Root diam: 3.58 cm Ao Asc diam:  3.66 cm MITRAL VALVE                TRICUSPID VALVE MV Area (PHT): 3.81 cm     TR Peak grad:   43.8 mmHg MV Decel Time: 199 msec     TR Vmax:        331.00 cm/s MV E velocity: 96.30 cm/s MV A velocity: 115.00 cm/s  SHUNTS MV E/A ratio:  0.84         Systemic Diam: 2.20 cm Shelda Bruckner MD Electronically signed by Shelda Bruckner MD Signature Date/Time: 08/19/2024/5:00:36 PM    Final    Scheduled Meds:  enoxaparin (LOVENOX) injection  50 mg Subcutaneous Daily   gabapentin   300 mg Oral TID   insulin  aspart  0-15 Units Subcutaneous TID WC   insulin  aspart  0-5 Units Subcutaneous QHS   insulin  glargine  15 Units Subcutaneous Daily   irbesartan  150 mg Oral Daily   Continuous Infusions:  DAPTOmycin Stopped (08/19/24 1730)   piperacillin-tazobactam (ZOSYN)  IV 3.375 g (08/20/24 0631)     LOS: 3 days    Time spent: 35 mins    Darcel Dawley, MD [facility]   If 7PM-7AM, please contact night-coverage

## 2024-08-20 NOTE — Plan of Care (Signed)

## 2024-08-20 NOTE — Inpatient Diabetes Management (Signed)
 Inpatient Diabetes Program Recommendations  AACE/ADA: New Consensus Statement on Inpatient Glycemic Control (2015)  Target Ranges:  Prepandial:   less than 140 mg/dL      Peak postprandial:   less than 180 mg/dL (1-2 hours)      Critically ill patients:  140 - 180 mg/dL   Lab Results  Component Value Date   GLUCAP 312 (H) 08/20/2024   HGBA1C 13.2 (H) 08/16/2024    Review of Glycemic Control  Diabetes history: DM2 Outpatient Diabetes medications: no insulin  the last 4-5 weeks, Novolog  10 units tid, Semglee  30 units bid, Jardiance 10 mg Daily, Metformin  1000 mg bid  Current orders for Inpatient glycemic control: Lantus  15 units daily, Novolog  0-15 TID with meals and 0-5 HS  HgbA1C - 13.2% CBGs 245, 312 mg/dL  Inpatient Diabetes Program Recommendations:    Consider increasing Lantus  to 20 units daily  Add Novolog  4 units TID with meals if eating > 50%  Change diet to CHO mod.  Spoke with pt at bedside late yesterday afternoon regarding his diabetes control and HgbA1C. Pt wanted pain meds at the time and was not interested in speaking with me.   Will need to restart insulin  and DM meds at discharge.  F/U on Monday, 10/13.  Thank you. Shona Brandy, RD, LDN, CDCES Inpatient Diabetes Coordinator [phone]

## 2024-08-21 DIAGNOSIS — L03116 Cellulitis of left lower limb: Secondary | ICD-10-CM | POA: Diagnosis not present

## 2024-08-21 LAB — GLUCOSE, CAPILLARY
Glucose-Capillary: 234 mg/dL — ABNORMAL HIGH (ref 70–99)
Glucose-Capillary: 345 mg/dL — ABNORMAL HIGH (ref 70–99)
Glucose-Capillary: 294 mg/dL — ABNORMAL HIGH (ref 70–99)
Glucose-Capillary: 343 mg/dL — ABNORMAL HIGH (ref 70–99)

## 2024-08-21 MED ORDER — INSULIN GLARGINE 100 UNIT/ML ~~LOC~~ SOLN
20.0000 [IU] | Freq: Every day | SUBCUTANEOUS | Status: DC
  Administered 2024-08-21: 20 [IU] via SUBCUTANEOUS
  Filled 2024-08-21 (×3): qty 0.2

## 2024-08-21 NOTE — Plan of Care (Signed)

## 2024-08-21 NOTE — Progress Notes (Signed)
 PROGRESS NOTE    Ryan [name]  FMW:[id number] DOB: 1966/03/19 DOA: 08/16/2024 PCP: [name] [name], NP   Brief Narrative:  This 58 yrs old male with medical history significant for poorly controlled type 2 diabetes mellitus, right-sided BKA in April 2024, tobacco abuse presenting with diabetic foot infection, L foot osteomyelitis.  Patient reports worsening left foot redness pain swelling and purulent drainage for last 2 months.  He has been following up with [facility].  Patient was sent in the ED for further evaluation from the wound center yesterday.  Left foot x-ray shows left fifth metatarsal joint changes consistent with osteomyelitis.  Patient was admitted and orthopedics was consulted,  started on empiric antibiotics.  Assessment & Plan:   Principal Problem:   Cellulitis of left lower extremity Active Problems:   Osteomyelitis (HCC)   Uncontrolled type 2 diabetes mellitus with hyperglycemia, without long-term current use of insulin  (HCC)   S/P BKA (below knee amputation) unilateral, right (HCC)   Tobacco use disorder   Chronic prescription opiate use - sees pain clinic in [place], [place].   GERD (gastroesophageal reflux disease)   CHF (congestive heart failure) (HCC)   COPD (chronic obstructive pulmonary disease) (HCC)   Osteomyelitis (HCC) Diabetic foot infection: MRSA bacteremia: Patient presented with worsening L foot purulent drainage, swelling and pain x 2 months  L foot plain films concerning for osteomyelitis.  Blood cultures positive for MRSA Initiated on IV zyvox and zosyn for infectious coverage , Antibiotics changed to IV daptomycin and Zosyn by ID.  Now zosyn discontinued, started on Levaquin and Flagyl. ESR/ CRP elevated. MRI foot : Findings compatible with osteomyelitis/septic arthritis of the underlying fifth proximal phalanx and mid to distal fifth metatarsal. Podiatry consulted.  Dr. Harden recommended transtibial amputation, but patient not  agreeable at this point. Continue IV antibiotics broad-spectrum and Dr. Harden will reassess on Monday. ID recommended TEE, Cardiology consulted and scheduled tentatively for Tuesday   Uncontrolled type 2 diabetes mellitus with hyperglycemia: Blood sugars 500s on presentation without overt DKA or HHS  BHB WNL , HbA1c 13.2  Continue Lantus  20 units and sliding scale. Diabetic education . Discussed importance of blood sugar management.  COPD (chronic obstructive pulmonary disease) (HCC) Stable from a resp standpoint. Continue home inhalers.   CHF (congestive heart failure) (HCC) 2D ECHO 05/2024 w/ EF 50-55%  Appears euvolemic at present. Weight today 109 kg  Monitor volume status with treatment.    GERD (gastroesophageal reflux disease) Continue PPI.    Chronic prescription opiate use - sees pain clinic in [place], [place]. On oxycodone  15mg  q4 hours prn  Will continue in setting of active diabetic foot infection.   Tobacco use disorder: Discussed cessation at length.  Nicotine patch.    S/P BKA (below knee amputation) unilateral, right (HCC) Previous R BKA in setting of PVD, tobacco abuse and poorly controlled t2dm.    DVT prophylaxis: SCDs Code Status: Full code Family Communication: No family at bedside. Disposition Plan:  Status is: Inpatient Remains inpatient appropriate because: Severity of illness.    Consultants:  Podiatry Infectious diseases  Procedures:  Antimicrobials:  Anti-infectives (From admission, onward)    Start     Dose/Rate Route Frequency Ordered Stop   08/20/24 1500  levofloxacin (LEVAQUIN) tablet 750 mg        750 mg Oral Daily 08/20/24 1410     08/20/24 1500  metroNIDAZOLE (FLAGYL) tablet 500 mg        500 mg Oral Every 12 hours  08/20/24 1410     08/18/24 1600  DAPTOmycin (CUBICIN) IVPB 700 mg/100mL premix  Status:  Discontinued        6 mg/kg  108.9 kg 200 mL/hr over 30 Minutes Intravenous Daily 08/18/24 1519 08/18/24 1529   08/18/24 1600   DAPTOmycin (CUBICIN) IVPB 700 mg/144mL premix        8 mg/kg  91.5 kg (Adjusted) 200 mL/hr over 30 Minutes Intravenous Daily 08/18/24 1529     08/18/24 0930  vancomycin  (VANCOCIN ) IVPB 1000 mg/200 mL premix  Status:  Discontinued       Placed in Followed by Linked Group   1,000 mg 200 mL/hr over 60 Minutes Intravenous Every 12 hours 08/18/24 0916 08/18/24 1519   08/18/24 0715  vancomycin  (VANCOREADY) IVPB 1250 mg/250 mL  Status:  Discontinued       Placed in Followed by Linked Group   1,250 mg 166.7 mL/hr over 90 Minutes Intravenous Every 12 hours 08/17/24 1914 08/18/24 0916   08/17/24 1915  vancomycin  (VANCOREADY) IVPB 2000 mg/400 mL       Placed in Followed by Linked Group   2,000 mg 200 mL/hr over 120 Minutes Intravenous  Once 08/17/24 1914 08/17/24 2303   08/17/24 1000  linezolid (ZYVOX) IVPB 600 mg  Status:  Discontinued        600 mg 300 mL/hr over 60 Minutes Intravenous Every 12 hours 08/17/24 0228 08/17/24 1914   08/17/24 1000  piperacillin-tazobactam (ZOSYN) IVPB 3.375 g  Status:  Discontinued        3.375 g 12.5 mL/hr over 240 Minutes Intravenous Every 8 hours 08/17/24 0303 08/20/24 1409   08/17/24 0200  piperacillin-tazobactam (ZOSYN) IVPB 3.375 g        3.375 g 100 mL/hr over 30 Minutes Intravenous  Once 08/17/24 0158 08/17/24 0216   08/17/24 0200  linezolid (ZYVOX) IVPB 600 mg        600 mg 300 mL/hr over 60 Minutes Intravenous  Once 08/17/24 0158 08/17/24 0415      Subjective: Patient was seen and examined at bedside. Overnight events noted. Patient is not sure whether he wants amputation or not. Patient does not seem to understand the severity of his clinical condition. Dr. Harden will reassess patient tomorrow  Objective: Vitals:   08/21/24 0015 08/21/24 0048 08/21/24 0400 08/21/24 0409  BP:  (!) 169/86 (!) 169/92   Pulse:      Resp: 20 (!) 21 (!) 22 (!) 23  Temp:   98.8 F (37.1 C)   TempSrc:   Oral   SpO2:  98% 94%   Weight:      Height:         Intake/Output Summary (Last 24 hours) at 08/21/2024 1056 Last data filed at 08/21/2024 0641 Gross per 24 hour  Intake --  Output 1650 ml  Net -1650 ml   Filed Weights   08/16/24 2208 08/20/24 0645  Weight: 108.9 kg 99.7 kg    Examination:  General exam: Appears calm and comfortable, not in any acute distress. Respiratory system: CTA Bilaterally . Respiratory effort normal.  RR 15 Cardiovascular system: S1 & S2 heard, RRR. No JVD, murmurs, rubs, gallops or clicks.  Gastrointestinal system: Abdomen is non distended, soft and nontender.  Normal bowel sounds heard. Central nervous system: Alert and oriented x 3. No focal neurological deficits. Extremities: Right BKA, left foot infection. Skin: No rashes, lesions or ulcers Psychiatry: Judgement and insight appear normal. Mood & affect appropriate.   Data Reviewed: I have personally reviewed  following labs and imaging studies  CBC: Recent Labs  Lab 08/16/24 2223 08/16/24 2229 08/16/24 2252 08/17/24 0346 08/18/24 0640 08/20/24 0412  WBC 10.7*  --   --  10.0 9.3 7.5  NEUTROABS  --   --   --  7.8*  --   --   HGB 13.3 16.7 15.3 11.6* 12.6* 12.0*  HCT 45.8 49.0 45.0 39.1 42.9 40.6  MCV 81.5  --   --  80.8 82.2 80.4  PLT 353  --   --  333 348 317   Basic Metabolic Panel: Recent Labs  Lab 08/16/24 2229 08/16/24 2231 08/16/24 2252 08/17/24 0346 08/18/24 0640 08/20/24 0412  NA 134* 130* 134* 133* 133* 135  K 3.8 3.6 3.6 3.4* 3.8 4.1  CL 94* 91*  --  97* 97* 99  CO2  --  28  --  27 26 27   GLUCOSE 573* 579*  --  352* 174* 234*  BUN 12 11  --  9 10 10   CREATININE 1.20 1.27*  --  1.08 1.09 1.05  CALCIUM  --  8.2*  --  7.9* 8.3* 8.2*  MG  --   --   --  1.6* 1.7 1.6*  PHOS  --   --   --   --   --  3.7   GFR: Estimated Creatinine Clearance: 95.2 mL/min (by C-G formula based on SCr of 1.05 mg/dL). Liver Function Tests: Recent Labs  Lab 08/16/24 2231 08/17/24 0346  AST 16 12*  ALT 14 11  ALKPHOS 201* 170*  BILITOT  0.7 0.6  PROT 7.5 6.8  ALBUMIN 1.6* <1.5*   No results for input(s): LIPASE, AMYLASE in the last 168 hours. No results for input(s): AMMONIA in the last 168 hours. Coagulation Profile: Recent Labs  Lab 08/17/24 0346  INR 1.3*   Cardiac Enzymes: Recent Labs  Lab 08/19/24 0933  CKTOTAL 57   BNP (last 3 results) No results for input(s): PROBNP in the last 8760 hours. HbA1C: No results for input(s): HGBA1C in the last 72 hours.  CBG: Recent Labs  Lab 08/20/24 0637 08/20/24 1120 08/20/24 1602 08/20/24 2147 08/21/24 0632  GLUCAP 245* 312* 324* 304* 234*   Lipid Profile: No results for input(s): CHOL, HDL, LDLCALC, TRIG, CHOLHDL, LDLDIRECT in the last 72 hours. Thyroid Function Tests: No results for input(s): TSH, T4TOTAL, FREET4, T3FREE, THYROIDAB in the last 72 hours. Anemia Panel: No results for input(s): VITAMINB12, FOLATE, FERRITIN, TIBC, IRON, RETICCTPCT in the last 72 hours. Sepsis Labs: No results for input(s): PROCALCITON, LATICACIDVEN in the last 168 hours.  Recent Results (from the past 240 hours)  Blood culture (routine x 2)     Status: Abnormal   Collection Time: 08/17/24  1:00 AM   Specimen: BLOOD  Result Value Ref Range Status   Specimen Description BLOOD SITE NOT SPECIFIED  Final   Special Requests   Final    BOTTLES DRAWN AEROBIC AND ANAEROBIC Blood Culture adequate volume   Culture  Setup Time   Final    GRAM POSITIVE COCCI IN BOTH AEROBIC AND ANAEROBIC BOTTLES CRITICAL VALUE NOTED.  VALUE IS CONSISTENT WITH PREVIOUSLY REPORTED AND CALLED VALUE.    Culture (A)  Final    STAPHYLOCOCCUS AUREUS SUSCEPTIBILITIES PERFORMED ON PREVIOUS CULTURE WITHIN THE LAST 5 DAYS. Performed at [facility], [address] [place], [place] [address]    Report Status 08/19/2024 FINAL  Final  Blood culture (routine x 2)     Status: Abnormal (Preliminary  result)   Collection Time: 08/17/24  1:05 AM   Specimen:  BLOOD  Result Value Ref Range Status   Specimen Description BLOOD SITE NOT SPECIFIED  Final   Special Requests   Final    BOTTLES DRAWN AEROBIC AND ANAEROBIC Blood Culture adequate volume   Culture  Setup Time   Final    GRAM POSITIVE COCCI IN CLUSTERS IN BOTH AEROBIC AND ANAEROBIC BOTTLES CRITICAL RESULT CALLED TO, READ BACK BY AND VERIFIED WITH: PHARMD AUSTIN PAYTES ON 08/17/24 @ 1904 BY DRT    Culture (A)  Final    METHICILLIN RESISTANT STAPHYLOCOCCUS AUREUS Sent to [facility] for further susceptibility testing. Performed at [facility], [address], [place], [place] [address]    Report Status PENDING  Incomplete   Organism ID, Bacteria METHICILLIN RESISTANT STAPHYLOCOCCUS AUREUS  Final      Susceptibility   Methicillin resistant staphylococcus aureus - MIC*    CIPROFLOXACIN >=8 RESISTANT Resistant     ERYTHROMYCIN >=8 RESISTANT Resistant     GENTAMICIN <=0.5 SENSITIVE Sensitive     OXACILLIN >=4 RESISTANT Resistant     TETRACYCLINE <=1 SENSITIVE Sensitive     VANCOMYCIN  <=0.5 SENSITIVE Sensitive     TRIMETH/SULFA 80 RESISTANT Resistant     CLINDAMYCIN <=0.25 SENSITIVE Sensitive     RIFAMPIN <=0.5 SENSITIVE Sensitive     Inducible Clindamycin NEGATIVE Sensitive     LINEZOLID 2 SENSITIVE Sensitive     * METHICILLIN RESISTANT STAPHYLOCOCCUS AUREUS  Blood Culture ID Panel (Reflexed)     Status: Abnormal   Collection Time: 08/17/24  1:05 AM  Result Value Ref Range Status   Enterococcus faecalis NOT DETECTED NOT DETECTED Final   Enterococcus Faecium NOT DETECTED NOT DETECTED Final   Listeria monocytogenes NOT DETECTED NOT DETECTED Final   Staphylococcus species DETECTED (A) NOT DETECTED Final    Comment: CRITICAL RESULT CALLED TO, READ BACK BY AND VERIFIED WITH: PHARMD AUSTIN PAYTES ON 08/17/24 @ 1904 BY DRT    Staphylococcus aureus (BCID) DETECTED (A) NOT DETECTED Final    Comment: Methicillin (oxacillin)-resistant Staphylococcus aureus (MRSA). MRSA is predictably  resistant to beta-lactam antibiotics (except ceftaroline). Preferred therapy is vancomycin  unless clinically contraindicated. Patient requires contact precautions if  hospitalized. CRITICAL RESULT CALLED TO, READ BACK BY AND VERIFIED WITH: PHARMD AUSTIN PAYTES ON 08/17/24 @ 1904 BY DRT    Staphylococcus epidermidis NOT DETECTED NOT DETECTED Final   Staphylococcus lugdunensis NOT DETECTED NOT DETECTED Final   Streptococcus species NOT DETECTED NOT DETECTED Final   Streptococcus agalactiae NOT DETECTED NOT DETECTED Final   Streptococcus pneumoniae NOT DETECTED NOT DETECTED Final   Streptococcus pyogenes NOT DETECTED NOT DETECTED Final   A.calcoaceticus-baumannii NOT DETECTED NOT DETECTED Final   Bacteroides fragilis NOT DETECTED NOT DETECTED Final   Enterobacterales NOT DETECTED NOT DETECTED Final   Enterobacter cloacae complex NOT DETECTED NOT DETECTED Final   Escherichia coli NOT DETECTED NOT DETECTED Final   Klebsiella aerogenes NOT DETECTED NOT DETECTED Final   Klebsiella oxytoca NOT DETECTED NOT DETECTED Final   Klebsiella pneumoniae NOT DETECTED NOT DETECTED Final   Proteus species NOT DETECTED NOT DETECTED Final   Salmonella species NOT DETECTED NOT DETECTED Final   Serratia marcescens NOT DETECTED NOT DETECTED Final   Haemophilus influenzae NOT DETECTED NOT DETECTED Final   Neisseria meningitidis NOT DETECTED NOT DETECTED Final   Pseudomonas aeruginosa NOT DETECTED NOT DETECTED Final   Stenotrophomonas maltophilia NOT DETECTED NOT DETECTED Final   Candida albicans NOT DETECTED NOT DETECTED Final  Candida auris NOT DETECTED NOT DETECTED Final   Candida glabrata NOT DETECTED NOT DETECTED Final   Candida krusei NOT DETECTED NOT DETECTED Final   Candida parapsilosis NOT DETECTED NOT DETECTED Final   Candida tropicalis NOT DETECTED NOT DETECTED Final   Cryptococcus neoformans/gattii NOT DETECTED NOT DETECTED Final   Meth resistant mecA/C and MREJ DETECTED (A) NOT DETECTED Final     Comment: CRITICAL RESULT CALLED TO, READ BACK BY AND VERIFIED WITH: PHARMD AUSTIN PAYTES ON 08/17/24 @ 1904 BY DRT Performed at [facility], [address] [place], [place] [address]   Culture, blood (Routine X 2) w Reflex to ID Panel     Status: None (Preliminary result)   Collection Time: 08/19/24  9:33 AM   Specimen: BLOOD RIGHT ARM  Result Value Ref Range Status   Specimen Description BLOOD RIGHT ARM  Final   Special Requests   Final    BOTTLES DRAWN AEROBIC AND ANAEROBIC Blood Culture results may not be optimal due to an inadequate volume of blood received in culture bottles   Culture   Final    NO GROWTH 2 DAYS Performed at [facility], [address], [place], [place] [address]    Report Status PENDING  Incomplete  Culture, blood (Routine X 2) w Reflex to ID Panel     Status: None (Preliminary result)   Collection Time: 08/19/24  9:33 AM   Specimen: BLOOD RIGHT HAND  Result Value Ref Range Status   Specimen Description BLOOD RIGHT HAND  Final   Special Requests   Final    BOTTLES DRAWN AEROBIC AND ANAEROBIC Blood Culture results may not be optimal due to an inadequate volume of blood received in culture bottles   Culture   Final    NO GROWTH 2 DAYS Performed at [facility], [address] [place], [place] [address]    Report Status PENDING  Incomplete    Radiology Studies: ECHOCARDIOGRAM COMPLETE Result Date: 08/19/2024    ECHOCARDIOGRAM REPORT   Patient Name:   Ryan [name] Date of Exam: 08/19/2024 Medical Rec #:  [id number]        Height:       73.0 in Accession #:    [id number]       Weight:       240.0 lb Date of Birth:  07/29/1966        BSA:          2.325 m Patient Age:    58 years         BP:           140/80 mmHg Patient Gender: M                HR:           89 bpm. Exam Location:  Inpatient Procedure: 2D Echo (Both Spectral and Color Flow Doppler were utilized during            procedure). Indications:    Bacteremia  History:        Patient  has prior history of Echocardiogram examinations.  Sonographer:    Charmaine Gaskins Referring Phys: [id number] GREGORY D CALONE  Sonographer Comments: Image acquisition challenging due to uncooperative patient. IMPRESSIONS  1. Left ventricular ejection fraction, by estimation, is 60 to 65%. The left ventricle has normal function. The left ventricle has no regional wall motion abnormalities. Left ventricular diastolic parameters are indeterminate.  2. Right ventricular systolic function is normal.  The right ventricular size is normal. There is moderately elevated pulmonary artery systolic pressure. The estimated right ventricular systolic pressure is 58.8 mmHg.  3. The mitral valve is normal in structure. No evidence of mitral valve regurgitation. No evidence of mitral stenosis.  4. Tricuspid valve regurgitation is moderate.  5. The aortic valve has an indeterminant number of cusps. There is mild calcification of the aortic valve. Aortic valve regurgitation is not visualized. Aortic valve sclerosis is present, with no evidence of aortic valve stenosis.  6. The inferior vena cava is dilated in size with <50% respiratory variability, suggesting right atrial pressure of 15 mmHg. Comparison(s): No significant change from prior study. Conclusion(s)/Recommendation(s): No evidence of valvular vegetations on this transthoracic echocardiogram. Consider a transesophageal echocardiogram to exclude infective endocarditis if clinically indicated. Moderate pulmonary hypertension. FINDINGS  Left Ventricle: Left ventricular ejection fraction, by estimation, is 60 to 65%. The left ventricle has normal function. The left ventricle has no regional wall motion abnormalities. The left ventricular internal cavity size was normal in size. There is  no left ventricular hypertrophy. Left ventricular diastolic parameters are indeterminate. Right Ventricle: The right ventricular size is normal. No increase in right ventricular wall thickness. Right  ventricular systolic function is normal. There is moderately elevated pulmonary artery systolic pressure. The tricuspid regurgitant velocity is 3.31 m/s, and with an assumed right atrial pressure of 15 mmHg, the estimated right ventricular systolic pressure is 58.8 mmHg. Left [facility]: Left atrial size was normal in size. Right [facility]: Right atrial size was normal in size. Pericardium: There is no evidence of pericardial effusion. Mitral Valve: The mitral valve is normal in structure. Mild to moderate mitral annular calcification. No evidence of mitral valve regurgitation. No evidence of mitral valve stenosis. Tricuspid Valve: The tricuspid valve is normal in structure. Tricuspid valve regurgitation is moderate . No evidence of tricuspid stenosis. Aortic Valve: Possible fusion of NCC-LCC, incompletely visualized. The aortic valve has an indeterminant number of cusps. There is mild calcification of the aortic valve. Aortic valve regurgitation is not visualized. Aortic valve sclerosis is present, with no evidence of aortic valve stenosis. Aortic valve mean gradient measures 6.0 mmHg. Aortic valve peak gradient measures 9.2 mmHg. Pulmonic Valve: The pulmonic valve was grossly normal. Pulmonic valve regurgitation is not visualized. No evidence of pulmonic stenosis. Aorta: The aortic root and ascending aorta are structurally normal, with no evidence of dilitation. Venous: The inferior vena cava is dilated in size with less than 50% respiratory variability, suggesting right atrial pressure of 15 mmHg. IAS/Shunts: The atrial septum is grossly normal.  LEFT VENTRICLE PLAX 2D LVIDd:         4.12 cm     Diastology LVIDs:         2.19 cm     LV e' medial:    6.20 cm/s LV PW:         0.91 cm     LV E/e' medial:  15.5 LV IVS:        0.87 cm     LV e' lateral:   6.64 cm/s LVOT diam:     2.20 cm     LV E/e' lateral: 14.5 LVOT Area:     3.80 cm  LV Volumes (MOD) LV vol d, MOD A4C: 72.2 ml LV vol s, MOD A4C: 25.2 ml LV SV MOD A4C:      72.2 ml RIGHT VENTRICLE RV Basal diam:  2.93 cm RV Mid diam:    2.79 cm RV S prime:  11.00 cm/s LEFT ATRIUM             Index        RIGHT ATRIUM           Index LA diam:        3.99 cm 1.72 cm/m   RA Area:     13.70 cm LA Vol (A2C):   74.0 ml 31.83 ml/m  RA Volume:   30.00 ml  12.90 ml/m LA Vol (A4C):   52.3 ml 22.49 ml/m LA Biplane Vol: 62.5 ml 26.88 ml/m  AORTIC VALVE AV Vmax:      152.00 cm/s AV Vmean:     114.000 cm/s AV VTI:       0.311 m AV Peak Grad: 9.2 mmHg AV Mean Grad: 6.0 mmHg  AORTA Ao Root diam: 3.58 cm Ao Asc diam:  3.66 cm MITRAL VALVE                TRICUSPID VALVE MV Area (PHT): 3.81 cm     TR Peak grad:   43.8 mmHg MV Decel Time: 199 msec     TR Vmax:        331.00 cm/s MV E velocity: 96.30 cm/s MV A velocity: 115.00 cm/s  SHUNTS MV E/A ratio:  0.84         Systemic Diam: 2.20 cm Shelda Bruckner MD Electronically signed by Shelda Bruckner MD Signature Date/Time: 08/19/2024/5:00:36 PM    Final    Scheduled Meds:  enoxaparin (LOVENOX) injection  50 mg Subcutaneous Daily   gabapentin   300 mg Oral TID   insulin  aspart  0-15 Units Subcutaneous TID WC   insulin  aspart  0-5 Units Subcutaneous QHS   insulin  glargine  20 Units Subcutaneous Daily   irbesartan  150 mg Oral Daily   levofloxacin  750 mg Oral Daily   metroNIDAZOLE  500 mg Oral Q12H   Continuous Infusions:  DAPTOmycin 700 mg (08/20/24 1436)     LOS: 4 days    Time spent: 35 mins    Darcel Dawley, MD [facility]   If 7PM-7AM, please contact night-coverage

## 2024-08-21 NOTE — Inpatient Diabetes Management (Signed)
 Inpatient Diabetes Program Recommendations  AACE/ADA: New Consensus Statement on Inpatient Glycemic Control (2015)  Target Ranges:  Prepandial:   less than 140 mg/dL      Peak postprandial:   less than 180 mg/dL (1-2 hours)      Critically ill patients:  140 - 180 mg/dL   Lab Results  Component Value Date   GLUCAP 345 (H) 08/21/2024   HGBA1C 13.2 (H) 08/16/2024    Review of Glycemic Control  Diabetes history: DM2 Outpatient Diabetes medications: no insulin  the last 4-5 weeks, Novolog  10 units tid, Semglee  30 units bid, Jardiance 10 mg Daily, Metformin  1000 mg bid  Current orders for Inpatient glycemic control: Lantus  20 units daily, Noolog 0-15 TID with meals and 0-5 HS  CBGs above goal of 140-180 mg/dL  Inpatient Diabetes Program Recommendations:    Consider adding Novolog  4 units TID with meals if eating > 50%  Change diet to CHO mod med  Continue to follow.  Thank you. Shona Brandy, RD, LDN, CDCES Inpatient Diabetes Coordinator [phone]

## 2024-08-22 DIAGNOSIS — R7881 Bacteremia: Secondary | ICD-10-CM | POA: Diagnosis not present

## 2024-08-22 DIAGNOSIS — E1169 Type 2 diabetes mellitus with other specified complication: Secondary | ICD-10-CM

## 2024-08-22 DIAGNOSIS — B9562 Methicillin resistant Staphylococcus aureus infection as the cause of diseases classified elsewhere: Secondary | ICD-10-CM | POA: Diagnosis not present

## 2024-08-22 DIAGNOSIS — F17210 Nicotine dependence, cigarettes, uncomplicated: Secondary | ICD-10-CM

## 2024-08-22 DIAGNOSIS — L03116 Cellulitis of left lower limb: Secondary | ICD-10-CM | POA: Diagnosis not present

## 2024-08-22 DIAGNOSIS — E1165 Type 2 diabetes mellitus with hyperglycemia: Secondary | ICD-10-CM | POA: Diagnosis not present

## 2024-08-22 LAB — BASIC METABOLIC PANEL WITH GFR
Anion gap: 11 (ref 5–15)
BUN: 16 mg/dL (ref 6–20)
CO2: 23 mmol/L (ref 22–32)
Calcium: 8.6 mg/dL — ABNORMAL LOW (ref 8.9–10.3)
Chloride: 99 mmol/L (ref 98–111)
Creatinine, Ser: 1.02 mg/dL (ref 0.61–1.24)
GFR, Estimated: >60 mL/min (ref >60)
Glucose, Bld: 271 mg/dL — ABNORMAL HIGH (ref 70–99)
Potassium: 4.7 mmol/L (ref 3.5–5.1)
Sodium: 133 mmol/L — ABNORMAL LOW (ref 135–145)

## 2024-08-22 LAB — GLUCOSE, CAPILLARY
Glucose-Capillary: 267 mg/dL — ABNORMAL HIGH (ref 70–99)
Glucose-Capillary: 313 mg/dL — ABNORMAL HIGH (ref 70–99)
Glucose-Capillary: 231 mg/dL — ABNORMAL HIGH (ref 70–99)
Glucose-Capillary: 240 mg/dL — ABNORMAL HIGH (ref 70–99)

## 2024-08-22 MED ORDER — INSULIN ASPART 100 UNIT/ML IJ SOLN
5.0000 [IU] | Freq: Three times a day (TID) | INTRAMUSCULAR | Status: DC
  Administered 2024-08-22 – 2024-08-23 (×4): 5 [IU] via SUBCUTANEOUS

## 2024-08-22 MED ORDER — INSULIN GLARGINE 100 UNIT/ML ~~LOC~~ SOLN
30.0000 [IU] | Freq: Every day | SUBCUTANEOUS | Status: DC
  Administered 2024-08-22 – 2024-08-23 (×2): 30 [IU] via SUBCUTANEOUS
  Filled 2024-08-22 (×2): qty 0.3

## 2024-08-22 MED ORDER — IRBESARTAN 300 MG PO TABS
300.0000 mg | ORAL_TABLET | Freq: Every day | ORAL | Status: DC
  Administered 2024-08-22 – 2024-09-15 (×25): 300 mg via ORAL
  Filled 2024-08-22 (×25): qty 1

## 2024-08-22 NOTE — Care Management Important Message (Signed)
 Important Message  Patient Details  Name: Ryan [name] MRN: [id number] Date of Birth: October 04, 1966   Important Message Given:  Yes - Medicare IM     Vonzell Arrie Sharps 08/22/2024, 11:23 AM

## 2024-08-22 NOTE — Plan of Care (Signed)

## 2024-08-22 NOTE — Progress Notes (Signed)
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        BKA        BKA         BKA          +-------+-----------+-----------+------------+------------+ Left   0.98       0.95       1.12        0.81         +-------+-----------+-----------+------------+------------+    Left great toe to large for cuff.  Summary: Left: Resting left ankle-brachial index is within normal range.  Great toe waveform appears normal. *See table(s) above for measurements and observations.  Electronically signed by Lonni Gaskins MD on 08/17/2024 at 3:08:16 PM.    Final    DG Foot Complete Left Result Date: 08/17/2024 CLINICAL DATA:  Open wound on the foot laterally EXAM: LEFT FOOT - COMPLETE 3+ VIEW COMPARISON:  None Available. FINDINGS: Soft tissue wound is noted laterally with evidence of subcutaneous air consistent with underlying soft tissue infection. Erosive  changes at the base of the fifth proximal phalanx as well as the head of the fifth metatarsal are seen consistent with osteomyelitis. No other erosive changes are seen. Soft tissue swelling is noted. Calcaneal spurs are seen. IMPRESSION: Erosive changes about the fifth MTP joint with evidence of subcutaneous infection and air consistent with osteomyelitis. Electronically Signed   By: Oneil Devonshire M.D.   On: 08/17/2024 01:38   I spent 50 minutes involved in face-to-face and non-face-to-face activities for this patient on the day of the visit. Professional time spent includes the following activities: Preparing to see the patient (review of tests), Obtaining and reviewing separately obtained history(hospitalist progress note), Performing a medically appropriate examination and evaluation, Ordering medications/labs, referring and communicating with other health care professionals, Documenting clinical information in the EMR, Independently interpreting results (not separately reported), Communicating results to the patient, Counseling and educating the patient and Care coordination (not separately reported).   Plan d/w requesting provider as well as ID pharm D  Of note, portions of this note may have been created with voice recognition software. While this note has been edited for accuracy, occasional wrong-word or 'sound-a-like' substitutions may have  occurred due to the inherent limitations of voice recognition software.   Electronically signed by:   Annalee Orem, MD Infectious Disease Physician [facility] Pager: [phone]

## 2024-08-22 NOTE — Progress Notes (Signed)
 PROGRESS NOTE    Ryan [name]  FMW:[id number] DOB: [birth year] DOA: 08/16/2024 PCP: [name] [name], NP   Brief Narrative:  This 58 yrs old male with medical history significant for poorly controlled type 2 diabetes mellitus, right-sided BKA in April 2024, tobacco abuse presenting with diabetic foot infection, L foot osteomyelitis.  Patient reports worsening left foot redness pain swelling and purulent drainage for last 2 months.  He has been following up with [facility].  Patient was sent in the ED for further evaluation from the wound center yesterday.  Left foot x-ray shows left fifth metatarsal joint changes consistent with osteomyelitis.  Patient was admitted and orthopedics was consulted,  started on empiric antibiotics.  Assessment & Plan:   Principal Problem:   Cellulitis of left lower extremity Active Problems:   Osteomyelitis (HCC)   Uncontrolled type 2 diabetes mellitus with hyperglycemia, without long-term current use of insulin  (HCC)   S/P BKA (below knee amputation) unilateral, right (HCC)   Tobacco use disorder   Chronic prescription opiate use - sees pain clinic in [place], [place].   GERD (gastroesophageal reflux disease)   CHF (congestive heart failure) (HCC)   COPD (chronic obstructive pulmonary disease) (HCC)   Left foot Osteomyelitis (HCC) Diabetic foot infection: MRSA bacteremia: Patient presented with worsening L foot purulent drainage, swelling and pain x 2 months.  L foot plain films concerning for osteomyelitis.  Blood cultures positive for MRSA Initiated on IV zyvox and zosyn for infectious coverage , Antibiotics changed to IV daptomycin and Zosyn by ID.  Now zosyn discontinued, started on Levaquin and Flagyl. ESR/ CRP elevated. MRI foot : Findings compatible with osteomyelitis/septic arthritis of the underlying fifth proximal phalanx and mid to distal fifth metatarsal. Podiatry consulted.  Dr. Harden recommended transtibial amputation, but  patient not agreeable at this point. Continue IV antibiotics broad-spectrum and Dr. Harden will reassess today. ID recommended TEE, Cardiology consulted and scheduled tentatively for Tuesday.   Uncontrolled type 2 diabetes mellitus with hyperglycemia: Blood sugars 500s on presentation without overt DKA or HHS  BHB WNL , HbA1c 13.2  Continue Lantus  30 units and sliding scale. Diabetic education . Discussed importance of blood sugar management.  COPD (chronic obstructive pulmonary disease) (HCC) Stable from a resp standpoint. Continue home inhalers.   CHF (congestive heart failure) (HCC) 2D ECHO 05/2024 w/ EF 50-55%  Appears euvolemic at present. Weight today 109 kg  Monitor volume status with treatment.    GERD (gastroesophageal reflux disease) Continue PPI.    Chronic prescription opiate use - sees pain clinic in [place], [place]. On oxycodone  15mg  q4 hours prn  Will continue in setting of active diabetic foot infection.   Tobacco use disorder: Discussed cessation at length.  Nicotine patch.    S/P BKA (below knee amputation) unilateral, right (HCC) Previous R BKA in setting of PVD, tobacco abuse and poorly controlled t2dm.    DVT prophylaxis: SCDs Code Status: Full code Family Communication: No family at bedside. Disposition Plan:  Status is: Inpatient Remains inpatient appropriate because: Severity of illness.    Consultants:  Podiatry Infectious diseases  Procedures:  Antimicrobials:  Anti-infectives (From admission, onward)    Start     Dose/Rate Route Frequency Ordered Stop   08/20/24 1500  levofloxacin (LEVAQUIN) tablet 750 mg        750 mg Oral Daily 08/20/24 1410     08/20/24 1500  metroNIDAZOLE (FLAGYL) tablet 500 mg        500 mg Oral Every 12  hours 08/20/24 1410     08/18/24 1600  DAPTOmycin (CUBICIN) IVPB 700 mg/121mL premix  Status:  Discontinued        6 mg/kg  108.9 kg 200 mL/hr over 30 Minutes Intravenous Daily 08/18/24 1519 08/18/24 1529    08/18/24 1600  DAPTOmycin (CUBICIN) IVPB 700 mg/177mL premix        8 mg/kg  91.5 kg (Adjusted) 200 mL/hr over 30 Minutes Intravenous Daily 08/18/24 1529     08/18/24 0930  vancomycin  (VANCOCIN ) IVPB 1000 mg/200 mL premix  Status:  Discontinued       Placed in Followed by Linked Group   1,000 mg 200 mL/hr over 60 Minutes Intravenous Every 12 hours 08/18/24 0916 08/18/24 1519   08/18/24 0715  vancomycin  (VANCOREADY) IVPB 1250 mg/250 mL  Status:  Discontinued       Placed in Followed by Linked Group   1,250 mg 166.7 mL/hr over 90 Minutes Intravenous Every 12 hours 08/17/24 1914 08/18/24 0916   08/17/24 1915  vancomycin  (VANCOREADY) IVPB 2000 mg/400 mL       Placed in Followed by Linked Group   2,000 mg 200 mL/hr over 120 Minutes Intravenous  Once 08/17/24 1914 08/17/24 2303   08/17/24 1000  linezolid (ZYVOX) IVPB 600 mg  Status:  Discontinued        600 mg 300 mL/hr over 60 Minutes Intravenous Every 12 hours 08/17/24 0228 08/17/24 1914   08/17/24 1000  piperacillin-tazobactam (ZOSYN) IVPB 3.375 g  Status:  Discontinued        3.375 g 12.5 mL/hr over 240 Minutes Intravenous Every 8 hours 08/17/24 0303 08/20/24 1409   08/17/24 0200  piperacillin-tazobactam (ZOSYN) IVPB 3.375 g        3.375 g 100 mL/hr over 30 Minutes Intravenous  Once 08/17/24 0158 08/17/24 0216   08/17/24 0200  linezolid (ZYVOX) IVPB 600 mg        600 mg 300 mL/hr over 60 Minutes Intravenous  Once 08/17/24 0158 08/17/24 0415      Subjective: Patient was seen and examined at bedside. Overnight events noted. Patient still not sure whether he wants amputation or not. Patient does not seem to understand the severity of his clinical condition. He is scheduled for TEE tomorrow.  Objective: Vitals:   08/22/24 0000 08/22/24 0339 08/22/24 0341 08/22/24 0809  BP: (!) 152/89 134/85  127/74  Pulse: 91 98    Resp: 19 18 18 15   Temp: 98.6 F (37 C) 98.1 F (36.7 C)    TempSrc: Oral Oral  Oral  SpO2: 98% 98%     Weight:      Height:        Intake/Output Summary (Last 24 hours) at 08/22/2024 1314 Last data filed at 08/22/2024 0600 Gross per 24 hour  Intake --  Output 2050 ml  Net -2050 ml   Filed Weights   08/16/24 2208 08/20/24 0645  Weight: 108.9 kg 99.7 kg    Examination:  General exam: Appears calm and comfortable, not in any acute distress. Respiratory system: CTA Bilaterally . Respiratory effort normal.  RR 14 Cardiovascular system: S1 & S2 heard, RRR. No JVD, murmurs, rubs, gallops or clicks.  Gastrointestinal system: Abdomen is non distended, soft and nontender.  Normal bowel sounds heard. Central nervous system: Alert and oriented x 3. No focal neurological deficits. Extremities: Right BKA, left foot infection. Skin: No rashes, lesions or ulcers Psychiatry: Judgement and insight appear normal. Mood & affect appropriate.   Data Reviewed: I have personally reviewed  following labs and imaging studies  CBC: Recent Labs  Lab 08/16/24 2223 08/16/24 2229 08/16/24 2252 08/17/24 0346 08/18/24 0640 08/20/24 0412  WBC 10.7*  --   --  10.0 9.3 7.5  NEUTROABS  --   --   --  7.8*  --   --   HGB 13.3 16.7 15.3 11.6* 12.6* 12.0*  HCT 45.8 49.0 45.0 39.1 42.9 40.6  MCV 81.5  --   --  80.8 82.2 80.4  PLT 353  --   --  333 348 317   Basic Metabolic Panel: Recent Labs  Lab 08/16/24 2231 08/16/24 2252 08/17/24 0346 08/18/24 0640 08/20/24 0412 08/22/24 0845  NA 130* 134* 133* 133* 135 133*  K 3.6 3.6 3.4* 3.8 4.1 4.7  CL 91*  --  97* 97* 99 99  CO2 28  --  27 26 27 23   GLUCOSE 579*  --  352* 174* 234* 271*  BUN 11  --  9 10 10 16   CREATININE 1.27*  --  1.08 1.09 1.05 1.02  CALCIUM 8.2*  --  7.9* 8.3* 8.2* 8.6*  MG  --   --  1.6* 1.7 1.6*  --   PHOS  --   --   --   --  3.7  --    GFR: Estimated Creatinine Clearance: 98 mL/min (by C-G formula based on SCr of 1.02 mg/dL). Liver Function Tests: Recent Labs  Lab 08/16/24 2231 08/17/24 0346  AST 16 12*  ALT 14 11   ALKPHOS 201* 170*  BILITOT 0.7 0.6  PROT 7.5 6.8  ALBUMIN 1.6* <1.5*   No results for input(s): LIPASE, AMYLASE in the last 168 hours. No results for input(s): AMMONIA in the last 168 hours. Coagulation Profile: Recent Labs  Lab 08/17/24 0346  INR 1.3*   Cardiac Enzymes: Recent Labs  Lab 08/19/24 0933  CKTOTAL 57   BNP (last 3 results) No results for input(s): PROBNP in the last 8760 hours. HbA1C: No results for input(s): HGBA1C in the last 72 hours.  CBG: Recent Labs  Lab 08/21/24 1157 08/21/24 1646 08/21/24 2034 08/22/24 0632 08/22/24 1159  GLUCAP 345* 294* 343* 267* 313*   Lipid Profile: No results for input(s): CHOL, HDL, LDLCALC, TRIG, CHOLHDL, LDLDIRECT in the last 72 hours. Thyroid Function Tests: No results for input(s): TSH, T4TOTAL, FREET4, T3FREE, THYROIDAB in the last 72 hours. Anemia Panel: No results for input(s): VITAMINB12, FOLATE, FERRITIN, TIBC, IRON, RETICCTPCT in the last 72 hours. Sepsis Labs: No results for input(s): PROCALCITON, LATICACIDVEN in the last 168 hours.  Recent Results (from the past 240 hours)  Blood culture (routine x 2)     Status: Abnormal   Collection Time: 08/17/24  1:00 AM   Specimen: BLOOD  Result Value Ref Range Status   Specimen Description BLOOD SITE NOT SPECIFIED  Final   Special Requests   Final    BOTTLES DRAWN AEROBIC AND ANAEROBIC Blood Culture adequate volume   Culture  Setup Time   Final    GRAM POSITIVE COCCI IN BOTH AEROBIC AND ANAEROBIC BOTTLES CRITICAL VALUE NOTED.  VALUE IS CONSISTENT WITH PREVIOUSLY REPORTED AND CALLED VALUE.    Culture (A)  Final    STAPHYLOCOCCUS AUREUS SUSCEPTIBILITIES PERFORMED ON PREVIOUS CULTURE WITHIN THE LAST 5 DAYS. Performed at [facility], [address] [place], [place] [address]    Report Status 08/19/2024 FINAL  Final  Blood culture (routine x 2)     Status: Abnormal (Preliminary result)   Collection  Time:  08/17/24  1:05 AM   Specimen: BLOOD  Result Value Ref Range Status   Specimen Description BLOOD SITE NOT SPECIFIED  Final   Special Requests   Final    BOTTLES DRAWN AEROBIC AND ANAEROBIC Blood Culture adequate volume   Culture  Setup Time   Final    GRAM POSITIVE COCCI IN CLUSTERS IN BOTH AEROBIC AND ANAEROBIC BOTTLES CRITICAL RESULT CALLED TO, READ BACK BY AND VERIFIED WITH: PHARMD AUSTIN PAYTES ON 08/17/24 @ 1904 BY DRT    Culture (A)  Final    METHICILLIN RESISTANT STAPHYLOCOCCUS AUREUS Sent to [facility] for further susceptibility testing. Performed at [facility], [address], [place], [place] [address]    Report Status PENDING  Incomplete   Organism ID, Bacteria METHICILLIN RESISTANT STAPHYLOCOCCUS AUREUS  Final      Susceptibility   Methicillin resistant staphylococcus aureus - MIC*    CIPROFLOXACIN >=8 RESISTANT Resistant     ERYTHROMYCIN >=8 RESISTANT Resistant     GENTAMICIN <=0.5 SENSITIVE Sensitive     OXACILLIN >=4 RESISTANT Resistant     TETRACYCLINE <=1 SENSITIVE Sensitive     VANCOMYCIN  <=0.5 SENSITIVE Sensitive     TRIMETH/SULFA 80 RESISTANT Resistant     CLINDAMYCIN <=0.25 SENSITIVE Sensitive     RIFAMPIN <=0.5 SENSITIVE Sensitive     Inducible Clindamycin NEGATIVE Sensitive     LINEZOLID 2 SENSITIVE Sensitive     * METHICILLIN RESISTANT STAPHYLOCOCCUS AUREUS  Blood Culture ID Panel (Reflexed)     Status: Abnormal   Collection Time: 08/17/24  1:05 AM  Result Value Ref Range Status   Enterococcus faecalis NOT DETECTED NOT DETECTED Final   Enterococcus Faecium NOT DETECTED NOT DETECTED Final   Listeria monocytogenes NOT DETECTED NOT DETECTED Final   Staphylococcus species DETECTED (A) NOT DETECTED Final    Comment: CRITICAL RESULT CALLED TO, READ BACK BY AND VERIFIED WITH: PHARMD AUSTIN PAYTES ON 08/17/24 @ 1904 BY DRT    Staphylococcus aureus (BCID) DETECTED (A) NOT DETECTED Final    Comment: Methicillin (oxacillin)-resistant Staphylococcus aureus  (MRSA). MRSA is predictably resistant to beta-lactam antibiotics (except ceftaroline). Preferred therapy is vancomycin  unless clinically contraindicated. Patient requires contact precautions if  hospitalized. CRITICAL RESULT CALLED TO, READ BACK BY AND VERIFIED WITH: PHARMD AUSTIN PAYTES ON 08/17/24 @ 1904 BY DRT    Staphylococcus epidermidis NOT DETECTED NOT DETECTED Final   Staphylococcus lugdunensis NOT DETECTED NOT DETECTED Final   Streptococcus species NOT DETECTED NOT DETECTED Final   Streptococcus agalactiae NOT DETECTED NOT DETECTED Final   Streptococcus pneumoniae NOT DETECTED NOT DETECTED Final   Streptococcus pyogenes NOT DETECTED NOT DETECTED Final   A.calcoaceticus-baumannii NOT DETECTED NOT DETECTED Final   Bacteroides fragilis NOT DETECTED NOT DETECTED Final   Enterobacterales NOT DETECTED NOT DETECTED Final   Enterobacter cloacae complex NOT DETECTED NOT DETECTED Final   Escherichia coli NOT DETECTED NOT DETECTED Final   Klebsiella aerogenes NOT DETECTED NOT DETECTED Final   Klebsiella oxytoca NOT DETECTED NOT DETECTED Final   Klebsiella pneumoniae NOT DETECTED NOT DETECTED Final   Proteus species NOT DETECTED NOT DETECTED Final   Salmonella species NOT DETECTED NOT DETECTED Final   Serratia marcescens NOT DETECTED NOT DETECTED Final   Haemophilus influenzae NOT DETECTED NOT DETECTED Final   Neisseria meningitidis NOT DETECTED NOT DETECTED Final   Pseudomonas aeruginosa NOT DETECTED NOT DETECTED Final   Stenotrophomonas maltophilia NOT DETECTED NOT DETECTED Final   Candida albicans NOT DETECTED NOT DETECTED Final   Candida auris  NOT DETECTED NOT DETECTED Final   Candida glabrata NOT DETECTED NOT DETECTED Final   Candida krusei NOT DETECTED NOT DETECTED Final   Candida parapsilosis NOT DETECTED NOT DETECTED Final   Candida tropicalis NOT DETECTED NOT DETECTED Final   Cryptococcus neoformans/gattii NOT DETECTED NOT DETECTED Final   Meth resistant mecA/C and MREJ DETECTED  (A) NOT DETECTED Final    Comment: CRITICAL RESULT CALLED TO, READ BACK BY AND VERIFIED WITH: PHARMD AUSTIN PAYTES ON 08/17/24 @ 1904 BY DRT Performed at [facility], [address], [place], [place] [address]   MIC (1 Drug)-     Status: None (Preliminary result)   Collection Time: 08/17/24  1:05 AM  Result Value Ref Range Status   Min Inhibitory Conc (1 Drug) Preliminary report  Final    Comment: (NOTE) Performed At: [facility] [address] [place], [place] [id number] Jennette Shorter MD Ey:[id number]    Source DAPTOMYCIN MIC STAPH AUREUS BLOOD CULTURE  Final    Comment: Performed at [facility], [address] [place], [place] [address]  MIC Result     Status: None   Collection Time: 08/17/24  1:05 AM  Result Value Ref Range Status   Result 1 (MIC) Staphylococcus aureus  Final    Comment: (NOTE) Identification performed by account, not confirmed by this laboratory. DAPTOMYCIN Performed At: [facility] [address] [place], [place] [id number] Jennette Shorter MD Ey:[id number]   Culture, blood (Routine X 2) w Reflex to ID Panel     Status: None (Preliminary result)   Collection Time: 08/19/24  9:33 AM   Specimen: BLOOD RIGHT ARM  Result Value Ref Range Status   Specimen Description BLOOD RIGHT ARM  Final   Special Requests   Final    BOTTLES DRAWN AEROBIC AND ANAEROBIC Blood Culture results may not be optimal due to an inadequate volume of blood received in culture bottles   Culture   Final    NO GROWTH 3 DAYS Performed at [facility], [address], [place], [place] [address]    Report Status PENDING  Incomplete  Culture, blood (Routine X 2) w Reflex to ID Panel     Status: None (Preliminary result)   Collection Time: 08/19/24  9:33 AM   Specimen: BLOOD RIGHT HAND  Result Value Ref Range Status   Specimen Description BLOOD RIGHT HAND  Final   Special Requests   Final    BOTTLES DRAWN AEROBIC AND ANAEROBIC Blood Culture results  may not be optimal due to an inadequate volume of blood received in culture bottles   Culture   Final    NO GROWTH 3 DAYS Performed at [facility], [address], [place], [place] [address]    Report Status PENDING  Incomplete    Radiology Studies: No results found.  Scheduled Meds:  enoxaparin (LOVENOX) injection  50 mg Subcutaneous Daily   gabapentin   300 mg Oral TID   insulin  aspart  0-15 Units Subcutaneous TID WC   insulin  aspart  0-5 Units Subcutaneous QHS   insulin  aspart  5 Units Subcutaneous TID WC   insulin  glargine  30 Units Subcutaneous Daily   irbesartan  300 mg Oral Daily   levofloxacin  750 mg Oral Daily   metroNIDAZOLE  500 mg Oral Q12H   Continuous Infusions:  DAPTOmycin 700 mg (08/22/24 1313)     LOS: 5 days    Time spent: 35 mins    Darcel Dawley, MD  [facility]   If 7PM-7AM, please contact night-coverage

## 2024-08-23 DIAGNOSIS — L03116 Cellulitis of left lower limb: Secondary | ICD-10-CM | POA: Diagnosis not present

## 2024-08-23 LAB — GLUCOSE, CAPILLARY
Glucose-Capillary: 199 mg/dL — ABNORMAL HIGH (ref 70–99)
Glucose-Capillary: 283 mg/dL — ABNORMAL HIGH (ref 70–99)
Glucose-Capillary: 312 mg/dL — ABNORMAL HIGH (ref 70–99)
Glucose-Capillary: 223 mg/dL — ABNORMAL HIGH (ref 70–99)

## 2024-08-23 LAB — MIC RESULT

## 2024-08-23 LAB — MRSA NEXT GEN BY PCR, NASAL: MRSA by PCR Next Gen: DETECTED — AB

## 2024-08-23 MED ORDER — INSULIN GLARGINE 100 UNIT/ML ~~LOC~~ SOLN
20.0000 [IU] | Freq: Two times a day (BID) | SUBCUTANEOUS | Status: DC
  Administered 2024-08-24: 20 [IU] via SUBCUTANEOUS
  Filled 2024-08-23 (×2): qty 0.2

## 2024-08-23 MED ORDER — INSULIN GLARGINE 100 UNIT/ML ~~LOC~~ SOLN
20.0000 [IU] | Freq: Two times a day (BID) | SUBCUTANEOUS | Status: DC
  Filled 2024-08-23: qty 0.2

## 2024-08-23 MED ORDER — INSULIN ASPART 100 UNIT/ML IJ SOLN
8.0000 [IU] | Freq: Three times a day (TID) | INTRAMUSCULAR | Status: DC
  Administered 2024-08-23 – 2024-08-27 (×8): 8 [IU] via SUBCUTANEOUS

## 2024-08-23 MED ORDER — INSULIN GLARGINE 100 UNIT/ML ~~LOC~~ SOLN
10.0000 [IU] | Freq: Once | SUBCUTANEOUS | Status: AC
  Administered 2024-08-23: 10 [IU] via SUBCUTANEOUS
  Filled 2024-08-23: qty 0.1

## 2024-08-23 MED ORDER — CHLORHEXIDINE GLUCONATE CLOTH 2 % EX PADS
6.0000 | MEDICATED_PAD | Freq: Every day | CUTANEOUS | Status: AC
  Administered 2024-08-24 – 2024-08-27 (×4): 6 via TOPICAL

## 2024-08-23 MED ORDER — MUPIROCIN 2 % EX OINT
1.0000 | TOPICAL_OINTMENT | Freq: Two times a day (BID) | CUTANEOUS | Status: AC
  Administered 2024-08-23 – 2024-08-28 (×10): 1 via NASAL
  Filled 2024-08-23 (×3): qty 22

## 2024-08-23 MED ORDER — LIVING WELL WITH DIABETES BOOK
Freq: Once | Status: AC
  Filled 2024-08-23: qty 1

## 2024-08-23 NOTE — Progress Notes (Signed)
 PROGRESS NOTE    Ryan [name]  FMW:[id number] DOB: [birth year] DOA: 08/16/2024 PCP: [name] [name], NP   Brief Narrative:  This 58 yrs old male with medical history significant for poorly controlled type 2 diabetes mellitus, right-sided BKA in April 2024, tobacco abuse presenting with diabetic foot infection, L foot osteomyelitis.  Patient reports worsening left foot redness pain swelling and purulent drainage for last 2 months.  He has been following up with [facility].  Patient was sent in the ED for further evaluation from the wound center yesterday.  Left foot x-ray shows left fifth metatarsal joint changes consistent with osteomyelitis.  Patient was admitted and orthopedics was consulted,  started on empiric antibiotics.  Assessment & Plan:   Principal Problem:   Cellulitis of left lower extremity Active Problems:   Osteomyelitis (HCC)   Uncontrolled type 2 diabetes mellitus with hyperglycemia, without long-term current use of insulin  (HCC)   S/P BKA (below knee amputation) unilateral, right (HCC)   Tobacco use disorder   Chronic prescription opiate use - sees pain clinic in [place], [place].   GERD (gastroesophageal reflux disease)   CHF (congestive heart failure) (HCC)   COPD (chronic obstructive pulmonary disease) (HCC)   Left foot Osteomyelitis (HCC) Diabetic foot infection: MRSA bacteremia: Patient presented with worsening L foot purulent drainage, swelling and pain x 2 months.  L foot plain films concerning for osteomyelitis.  Blood cultures positive for MRSA Initiated on IV zyvox and zosyn for infectious coverage , Antibiotics changed to IV daptomycin and Zosyn by ID.  Now zosyn discontinued, started on Levaquin and Flagyl. Now Levaquin and Flagyl discontinued by ID. Continue IV daptomycin for now. ESR/ CRP elevated. MRI foot : Findings compatible with osteomyelitis/septic arthritis of the underlying fifth proximal phalanx and mid to distal fifth  metatarsal. Podiatry consulted.  Dr. Harden recommended transtibial amputation, but patient not agreeable at this point. Continue IV antibiotics broad-spectrum and Dr. Harden will reassess today. ID recommended TEE, Cardiology consulted and scheduled tentatively for Wednesday.   Uncontrolled type 2 diabetes mellitus with hyperglycemia: Blood sugars 500s on presentation without overt DKA or HHS  BHB WNL , HbA1c 13.2  Continue Lantus  30 units and sliding scale. Diabetic education . Discussed importance of blood sugar management.  COPD (chronic obstructive pulmonary disease) (HCC) Stable from a resp standpoint. Continue home inhalers.   CHF (congestive heart failure) (HCC) 2D ECHO 05/2024 w/ EF 50-55%  Appears euvolemic at present. Weight today 109 kg  Monitor volume status with treatment.    GERD (gastroesophageal reflux disease) Continue PPI.    Chronic prescription opiate use - sees pain clinic in [place], [place]. On oxycodone  15mg  q4 hours prn  Will continue in setting of active diabetic foot infection.   Tobacco use disorder: Discussed cessation at length.  Nicotine patch.    S/P BKA (below knee amputation) unilateral, right [facility]): Previous R BKA in setting of PVD, tobacco abuse and poorly controlled t2dm.    DVT prophylaxis: SCDs Code Status: Full code Family Communication: No family at bedside. Disposition Plan:  Status is: Inpatient Remains inpatient appropriate because: Severity of illness.    Consultants:  Podiatry Infectious diseases,  Procedures:  Antimicrobials:  Anti-infectives (From admission, onward)    Start     Dose/Rate Route Frequency Ordered Stop   08/20/24 1500  levofloxacin (LEVAQUIN) tablet 750 mg  Status:  Discontinued        750 mg Oral Daily 08/20/24 1410 08/23/24 0916   08/20/24 1500  metroNIDAZOLE (  FLAGYL) tablet 500 mg  Status:  Discontinued        500 mg Oral Every 12 hours 08/20/24 1410 08/23/24 0916   08/18/24 1600  DAPTOmycin (CUBICIN)  IVPB 700 mg/100mL premix  Status:  Discontinued        6 mg/kg  108.9 kg 200 mL/hr over 30 Minutes Intravenous Daily 08/18/24 1519 08/18/24 1529   08/18/24 1600  DAPTOmycin (CUBICIN) IVPB 700 mg/159mL premix        8 mg/kg  91.5 kg (Adjusted) 200 mL/hr over 30 Minutes Intravenous Daily 08/18/24 1529     08/18/24 0930  vancomycin  (VANCOCIN ) IVPB 1000 mg/200 mL premix  Status:  Discontinued       Placed in Followed by Linked Group   1,000 mg 200 mL/hr over 60 Minutes Intravenous Every 12 hours 08/18/24 0916 08/18/24 1519   08/18/24 0715  vancomycin  (VANCOREADY) IVPB 1250 mg/250 mL  Status:  Discontinued       Placed in Followed by Linked Group   1,250 mg 166.7 mL/hr over 90 Minutes Intravenous Every 12 hours 08/17/24 1914 08/18/24 0916   08/17/24 1915  vancomycin  (VANCOREADY) IVPB 2000 mg/400 mL       Placed in Followed by Linked Group   2,000 mg 200 mL/hr over 120 Minutes Intravenous  Once 08/17/24 1914 08/17/24 2303   08/17/24 1000  linezolid (ZYVOX) IVPB 600 mg  Status:  Discontinued        600 mg 300 mL/hr over 60 Minutes Intravenous Every 12 hours 08/17/24 0228 08/17/24 1914   08/17/24 1000  piperacillin-tazobactam (ZOSYN) IVPB 3.375 g  Status:  Discontinued        3.375 g 12.5 mL/hr over 240 Minutes Intravenous Every 8 hours 08/17/24 0303 08/20/24 1409   08/17/24 0200  piperacillin-tazobactam (ZOSYN) IVPB 3.375 g        3.375 g 100 mL/hr over 30 Minutes Intravenous  Once 08/17/24 0158 08/17/24 0216   08/17/24 0200  linezolid (ZYVOX) IVPB 600 mg        600 mg 300 mL/hr over 60 Minutes Intravenous  Once 08/17/24 0158 08/17/24 0415      Subjective: Patient was seen and examined at bedside. Overnight events noted. Patient still not sure whether he wants amputation or not. Patient does not seem to understand the severity of his clinical condition. He is scheduled for TEE tomorrow.  Objective: Vitals:   08/23/24 0809 08/23/24 1055 08/23/24 1209 08/23/24 1243  BP:  130/79 (!) 140/81  (!) 149/95  Pulse: (!) 102   100  Resp: 17  (!) 24 19  Temp: 98.6 F (37 C)   98.4 F (36.9 C)  TempSrc: Oral   Oral  SpO2: 98%   98%  Weight:      Height:        Intake/Output Summary (Last 24 hours) at 08/23/2024 1322 Last data filed at 08/23/2024 1059 Gross per 24 hour  Intake 783.14 ml  Output 2750 ml  Net -1966.86 ml   Filed Weights   08/16/24 2208 08/20/24 0645 08/23/24 0604  Weight: 108.9 kg 99.7 kg 100.8 kg    Examination:  General exam: Appears calm and comfortable, not in any acute distress. Respiratory system: CTA Bilaterally. Respiratory effort normal.  RR 15 Cardiovascular system: S1 & S2 heard, RRR. No JVD, murmurs, rubs, gallops or clicks.  Gastrointestinal system: Abdomen is non distended, soft and nontender. Normal bowel sounds heard. Central nervous system: Alert and oriented x 3. No focal neurological deficits. Extremities: Right BKA, left  foot infection. Skin: No rashes, lesions or ulcers Psychiatry: Judgement and insight appear normal. Mood & affect appropriate.   Data Reviewed: I have personally reviewed following labs and imaging studies  CBC: Recent Labs  Lab 08/16/24 2223 08/16/24 2229 08/16/24 2252 08/17/24 0346 08/18/24 0640 08/20/24 0412  WBC 10.7*  --   --  10.0 9.3 7.5  NEUTROABS  --   --   --  7.8*  --   --   HGB 13.3 16.7 15.3 11.6* 12.6* 12.0*  HCT 45.8 49.0 45.0 39.1 42.9 40.6  MCV 81.5  --   --  80.8 82.2 80.4  PLT 353  --   --  333 348 317   Basic Metabolic Panel: Recent Labs  Lab 08/16/24 2231 08/16/24 2252 08/17/24 0346 08/18/24 0640 08/20/24 0412 08/22/24 0845  NA 130* 134* 133* 133* 135 133*  K 3.6 3.6 3.4* 3.8 4.1 4.7  CL 91*  --  97* 97* 99 99  CO2 28  --  27 26 27 23   GLUCOSE 579*  --  352* 174* 234* 271*  BUN 11  --  9 10 10 16   CREATININE 1.27*  --  1.08 1.09 1.05 1.02  CALCIUM 8.2*  --  7.9* 8.3* 8.2* 8.6*  MG  --   --  1.6* 1.7 1.6*  --   PHOS  --   --   --   --  3.7  --     GFR: Estimated Creatinine Clearance: 98.6 mL/min (by C-G formula based on SCr of 1.02 mg/dL). Liver Function Tests: Recent Labs  Lab 08/16/24 2231 08/17/24 0346  AST 16 12*  ALT 14 11  ALKPHOS 201* 170*  BILITOT 0.7 0.6  PROT 7.5 6.8  ALBUMIN 1.6* <1.5*   No results for input(s): LIPASE, AMYLASE in the last 168 hours. No results for input(s): AMMONIA in the last 168 hours. Coagulation Profile: Recent Labs  Lab 08/17/24 0346  INR 1.3*   Cardiac Enzymes: Recent Labs  Lab 08/19/24 0933  CKTOTAL 57   BNP (last 3 results) No results for input(s): PROBNP in the last 8760 hours. HbA1C: No results for input(s): HGBA1C in the last 72 hours.  CBG: Recent Labs  Lab 08/22/24 1159 08/22/24 1639 08/22/24 2051 08/23/24 0606 08/23/24 1149  GLUCAP 313* 231* 240* 199* 283*   Lipid Profile: No results for input(s): CHOL, HDL, LDLCALC, TRIG, CHOLHDL, LDLDIRECT in the last 72 hours. Thyroid Function Tests: No results for input(s): TSH, T4TOTAL, FREET4, T3FREE, THYROIDAB in the last 72 hours. Anemia Panel: No results for input(s): VITAMINB12, FOLATE, FERRITIN, TIBC, IRON, RETICCTPCT in the last 72 hours. Sepsis Labs: No results for input(s): PROCALCITON, LATICACIDVEN in the last 168 hours.  Recent Results (from the past 240 hours)  Blood culture (routine x 2)     Status: Abnormal   Collection Time: 08/17/24  1:00 AM   Specimen: BLOOD  Result Value Ref Range Status   Specimen Description BLOOD SITE NOT SPECIFIED  Final   Special Requests   Final    BOTTLES DRAWN AEROBIC AND ANAEROBIC Blood Culture adequate volume   Culture  Setup Time   Final    GRAM POSITIVE COCCI IN BOTH AEROBIC AND ANAEROBIC BOTTLES CRITICAL VALUE NOTED.  VALUE IS CONSISTENT WITH PREVIOUSLY REPORTED AND CALLED VALUE.    Culture (A)  Final    STAPHYLOCOCCUS AUREUS SUSCEPTIBILITIES PERFORMED ON PREVIOUS CULTURE WITHIN THE LAST 5 DAYS. Performed at [facility], [address] [place],   [address]    Report Status 08/19/2024 FINAL  Final  Blood culture (routine x 2)     Status: Abnormal (Preliminary result)   Collection Time: 08/17/24  1:05 AM   Specimen: BLOOD  Result Value Ref Range Status   Specimen Description BLOOD SITE NOT SPECIFIED  Final   Special Requests   Final    BOTTLES DRAWN AEROBIC AND ANAEROBIC Blood Culture adequate volume   Culture  Setup Time   Final    GRAM POSITIVE COCCI IN CLUSTERS IN BOTH AEROBIC AND ANAEROBIC BOTTLES CRITICAL RESULT CALLED TO, READ BACK BY AND VERIFIED WITH: PHARMD AUSTIN PAYTES ON 08/17/24 @ 1904 BY DRT    Culture (A)  Final    METHICILLIN RESISTANT STAPHYLOCOCCUS AUREUS Sent to [facility] for further susceptibility testing. Performed at [facility], [address] [place], [place] [address]    Report Status PENDING  Incomplete   Organism ID, Bacteria METHICILLIN RESISTANT STAPHYLOCOCCUS AUREUS  Final      Susceptibility   Methicillin resistant staphylococcus aureus - MIC*    CIPROFLOXACIN >=8 RESISTANT Resistant     ERYTHROMYCIN >=8 RESISTANT Resistant     GENTAMICIN <=0.5 SENSITIVE Sensitive     OXACILLIN >=4 RESISTANT Resistant     TETRACYCLINE <=1 SENSITIVE Sensitive     VANCOMYCIN  <=0.5 SENSITIVE Sensitive     TRIMETH/SULFA 80 RESISTANT Resistant     CLINDAMYCIN <=0.25 SENSITIVE Sensitive     RIFAMPIN <=0.5 SENSITIVE Sensitive     Inducible Clindamycin NEGATIVE Sensitive     LINEZOLID 2 SENSITIVE Sensitive     * METHICILLIN RESISTANT STAPHYLOCOCCUS AUREUS  Blood Culture ID Panel (Reflexed)     Status: Abnormal   Collection Time: 08/17/24  1:05 AM  Result Value Ref Range Status   Enterococcus faecalis NOT DETECTED NOT DETECTED Final   Enterococcus Faecium NOT DETECTED NOT DETECTED Final   Listeria monocytogenes NOT DETECTED NOT DETECTED Final   Staphylococcus species DETECTED (A) NOT DETECTED Final    Comment: CRITICAL RESULT CALLED TO, READ BACK BY AND VERIFIED  WITH: PHARMD AUSTIN PAYTES ON 08/17/24 @ 1904 BY DRT    Staphylococcus aureus (BCID) DETECTED (A) NOT DETECTED Final    Comment: Methicillin (oxacillin)-resistant Staphylococcus aureus (MRSA). MRSA is predictably resistant to beta-lactam antibiotics (except ceftaroline). Preferred therapy is vancomycin  unless clinically contraindicated. Patient requires contact precautions if  hospitalized. CRITICAL RESULT CALLED TO, READ BACK BY AND VERIFIED WITH: PHARMD AUSTIN PAYTES ON 08/17/24 @ 1904 BY DRT    Staphylococcus epidermidis NOT DETECTED NOT DETECTED Final   Staphylococcus lugdunensis NOT DETECTED NOT DETECTED Final   Streptococcus species NOT DETECTED NOT DETECTED Final   Streptococcus agalactiae NOT DETECTED NOT DETECTED Final   Streptococcus pneumoniae NOT DETECTED NOT DETECTED Final   Streptococcus pyogenes NOT DETECTED NOT DETECTED Final   A.calcoaceticus-baumannii NOT DETECTED NOT DETECTED Final   Bacteroides fragilis NOT DETECTED NOT DETECTED Final   Enterobacterales NOT DETECTED NOT DETECTED Final   Enterobacter cloacae complex NOT DETECTED NOT DETECTED Final   Escherichia coli NOT DETECTED NOT DETECTED Final   Klebsiella aerogenes NOT DETECTED NOT DETECTED Final   Klebsiella oxytoca NOT DETECTED NOT DETECTED Final   Klebsiella pneumoniae NOT DETECTED NOT DETECTED Final   Proteus species NOT DETECTED NOT DETECTED Final   Salmonella species NOT DETECTED NOT DETECTED Final   Serratia marcescens NOT DETECTED NOT DETECTED Final   Haemophilus influenzae NOT DETECTED NOT DETECTED Final   Neisseria meningitidis NOT DETECTED NOT DETECTED Final   Pseudomonas aeruginosa NOT  DETECTED NOT DETECTED Final   Stenotrophomonas maltophilia NOT DETECTED NOT DETECTED Final   Candida albicans NOT DETECTED NOT DETECTED Final   Candida auris NOT DETECTED NOT DETECTED Final   Candida glabrata NOT DETECTED NOT DETECTED Final   Candida krusei NOT DETECTED NOT DETECTED Final   Candida parapsilosis NOT  DETECTED NOT DETECTED Final   Candida tropicalis NOT DETECTED NOT DETECTED Final   Cryptococcus neoformans/gattii NOT DETECTED NOT DETECTED Final   Meth resistant mecA/C and MREJ DETECTED (A) NOT DETECTED Final    Comment: CRITICAL RESULT CALLED TO, READ BACK BY AND VERIFIED WITH: PHARMD AUSTIN PAYTES ON 08/17/24 @ 1904 BY DRT Performed at [facility], [address] [place], [place] [address]   MIC (1 Drug)-     Status: None (Preliminary result)   Collection Time: 08/17/24  1:05 AM  Result Value Ref Range Status   Min Inhibitory Conc (1 Drug) Preliminary report  Final    Comment: (NOTE) Performed At: [facility] [address] [place], [place] [id number] Jennette Shorter MD Ey:[id number]    Source DAPTOMYCIN MIC STAPH AUREUS BLOOD CULTURE  Final    Comment: Performed at [facility], [address] [place], [place] [address]  MIC Result     Status: None   Collection Time: 08/17/24  1:05 AM  Result Value Ref Range Status   Result 1 (MIC) Staphylococcus aureus  Final    Comment: (NOTE) Identification performed by account, not confirmed by this laboratory. DAPTOMYCIN Performed At: [facility] [address] [place], [place] [id number] Jennette Shorter MD Ey:[id number]   Culture, blood (Routine X 2) w Reflex to ID Panel     Status: None (Preliminary result)   Collection Time: 08/19/24  9:33 AM   Specimen: BLOOD RIGHT ARM  Result Value Ref Range Status   Specimen Description BLOOD RIGHT ARM  Final   Special Requests   Final    BOTTLES DRAWN AEROBIC AND ANAEROBIC Blood Culture results may not be optimal due to an inadequate volume of blood received in culture bottles   Culture   Final    NO GROWTH 4 DAYS Performed at [facility], [address] [place], [place] [address]    Report Status PENDING  Incomplete  Culture, blood (Routine X 2) w Reflex to ID Panel     Status: None (Preliminary result)   Collection Time: 08/19/24  9:33 AM    Specimen: BLOOD RIGHT HAND  Result Value Ref Range Status   Specimen Description BLOOD RIGHT HAND  Final   Special Requests   Final    BOTTLES DRAWN AEROBIC AND ANAEROBIC Blood Culture results may not be optimal due to an inadequate volume of blood received in culture bottles   Culture   Final    NO GROWTH 4 DAYS Performed at [facility], [address], [place], [place] [address]    Report Status PENDING  Incomplete    Radiology Studies: No results found.  Scheduled Meds:  enoxaparin (LOVENOX) injection  50 mg Subcutaneous Daily   gabapentin   300 mg Oral TID   insulin  aspart  0-15 Units Subcutaneous TID WC   insulin  aspart  0-5 Units Subcutaneous QHS   insulin  aspart  8 Units Subcutaneous TID WC   insulin  glargine  10 Units Subcutaneous Once   [START ON 08/24/2024] insulin  glargine  20 Units Subcutaneous BID   irbesartan  300 mg Oral Daily   living well with diabetes book  Does not apply Once   Continuous Infusions:  DAPTOmycin 700 mg (08/22/24 1313)     LOS: 6 days    Time spent: 35 mins    Darcel Dawley, MD [facility]   If 7PM-7AM, please contact night-coverage

## 2024-08-23 NOTE — Inpatient Diabetes Management (Signed)
 Inpatient Diabetes Program Recommendations  AACE/ADA: New Consensus Statement on Inpatient Glycemic Control (2015)  Target Ranges:  Prepandial:   less than 140 mg/dL      Peak postprandial:   less than 180 mg/dL (1-2 hours)      Critically ill patients:  140 - 180 mg/dL   Lab Results  Component Value Date   GLUCAP 199 (H) 08/23/2024   HGBA1C 13.2 (H) 08/16/2024    Review of Glycemic Control  Latest Reference Range & Units 08/22/24 06:32 08/22/24 11:59 08/22/24 16:39 08/22/24 20:51 08/23/24 06:06  Glucose-Capillary 70 - 99 mg/dL 732 (H) 686 (H) 768 (H) 240 (H) 199 (H)  (H): Data is abnormally high  Diabetes history: DM2 Outpatient Diabetes medications: no insulin  the last 4-5 weeks, Novolog  10 units tid, Semglee  30 units bid, Jardiance 10 mg Daily, Metformin  1000 mg bid  Current orders for Inpatient glycemic control: Lantus  30 units daily, Novolog  0-15 TID with meals and 0-5 HS, Novolog  5 units TID  Inpatient Diabetes Program Recommendations:    Please consider:  Lantus  20 units BID Novolog  8 units TID with meals if he consumes at least 50%  Thank you, Wyvonna Pinal, MSN, CDCES Diabetes Coordinator Inpatient Diabetes Program [phone] (team pager from 8a-5p)

## 2024-08-23 NOTE — Progress Notes (Signed)
   [facility] has been requested to perform a transesophageal echocardiogram on Ryan [name] for bacteremia.    The patient does NOT have any absolute or relative contraindications to a Transesophageal Echocardiogram (TEE).  The patient has: History of Obstructive Sleep Apnea    After careful review of history and examination, the risks and benefits of transesophageal echocardiogram have been explained including risks of esophageal damage, perforation (1:10,000 risk), bleeding, pharyngeal hematoma as well as other potential complications associated with conscious sedation including aspiration, arrhythmia, respiratory failure and death. Alternatives to treatment were discussed, questions were answered. Patient is willing to proceed.   Signed, Waddell DELENA Donath, PA-C  08/23/2024 5:03 PM

## 2024-08-23 NOTE — Plan of Care (Signed)
  Problem: Tissue Perfusion: Goal: Adequacy of tissue perfusion will improve Outcome: Not Progressing   Problem: Clinical Measurements: Goal: Will remain free from infection Outcome: Not Progressing

## 2024-08-23 NOTE — Plan of Care (Signed)
  Problem: Metabolic: Goal: Ability to maintain appropriate glucose levels will improve Outcome: Progressing   Problem: Tissue Perfusion: Goal: Adequacy of tissue perfusion will improve Outcome: Progressing   Problem: Skin Integrity: Goal: Risk for impaired skin integrity will decrease Outcome: Progressing   Problem: Safety: Goal: Ability to remain free from injury will improve Outcome: Progressing

## 2024-08-23 NOTE — Plan of Care (Signed)
  Problem: Fluid Volume: Goal: Ability to maintain a balanced intake and output will improve Outcome: Progressing   Problem: Nutritional: Goal: Maintenance of adequate nutrition will improve Outcome: Progressing   Problem: Tissue Perfusion: Goal: Adequacy of tissue perfusion will improve Outcome: Progressing   Problem: Clinical Measurements: Goal: Ability to maintain clinical measurements within normal limits will improve Outcome: Progressing Goal: Diagnostic test results will improve Outcome: Progressing Goal: Respiratory complications will improve Outcome: Progressing Goal: Cardiovascular complication will be avoided Outcome: Progressing   Problem: Activity: Goal: Risk for activity intolerance will decrease Outcome: Progressing   Problem: Nutrition: Goal: Adequate nutrition will be maintained Outcome: Progressing   Problem: Coping: Goal: Level of anxiety will decrease Outcome: Progressing   Problem: Elimination: Goal: Will not experience complications related to bowel motility Outcome: Progressing Goal: Will not experience complications related to urinary retention Outcome: Progressing   Problem: Pain Managment: Goal: General experience of comfort will improve and/or be controlled Outcome: Progressing   Problem: Safety: Goal: Ability to remain free from injury will improve Outcome: Progressing   Problem: Skin Integrity: Goal: Risk for impaired skin integrity will decrease Outcome: Progressing

## 2024-08-24 ENCOUNTER — Encounter (HOSPITAL_COMMUNITY): Payer: Self-pay | Admitting: Internal Medicine

## 2024-08-24 ENCOUNTER — Inpatient Hospital Stay (HOSPITAL_COMMUNITY): Admitting: Anesthesiology

## 2024-08-24 ENCOUNTER — Inpatient Hospital Stay (HOSPITAL_COMMUNITY)

## 2024-08-24 ENCOUNTER — Encounter (HOSPITAL_COMMUNITY): Admission: EM | Disposition: E | Payer: Self-pay | Source: Home / Self Care | Attending: Internal Medicine

## 2024-08-24 DIAGNOSIS — M869 Osteomyelitis, unspecified: Secondary | ICD-10-CM

## 2024-08-24 DIAGNOSIS — L03116 Cellulitis of left lower limb: Secondary | ICD-10-CM | POA: Diagnosis not present

## 2024-08-24 DIAGNOSIS — M86272 Subacute osteomyelitis, left ankle and foot: Secondary | ICD-10-CM | POA: Diagnosis not present

## 2024-08-24 DIAGNOSIS — E1165 Type 2 diabetes mellitus with hyperglycemia: Secondary | ICD-10-CM | POA: Diagnosis not present

## 2024-08-24 DIAGNOSIS — K219 Gastro-esophageal reflux disease without esophagitis: Secondary | ICD-10-CM

## 2024-08-24 DIAGNOSIS — Z79891 Long term (current) use of opiate analgesic: Secondary | ICD-10-CM

## 2024-08-24 DIAGNOSIS — F17210 Nicotine dependence, cigarettes, uncomplicated: Secondary | ICD-10-CM

## 2024-08-24 DIAGNOSIS — E11621 Type 2 diabetes mellitus with foot ulcer: Secondary | ICD-10-CM

## 2024-08-24 DIAGNOSIS — I110 Hypertensive heart disease with heart failure: Secondary | ICD-10-CM

## 2024-08-24 DIAGNOSIS — Z89511 Acquired absence of right leg below knee: Secondary | ICD-10-CM | POA: Diagnosis not present

## 2024-08-24 DIAGNOSIS — F17200 Nicotine dependence, unspecified, uncomplicated: Secondary | ICD-10-CM

## 2024-08-24 DIAGNOSIS — I361 Nonrheumatic tricuspid (valve) insufficiency: Secondary | ICD-10-CM | POA: Diagnosis not present

## 2024-08-24 DIAGNOSIS — J449 Chronic obstructive pulmonary disease, unspecified: Secondary | ICD-10-CM

## 2024-08-24 DIAGNOSIS — R7881 Bacteremia: Secondary | ICD-10-CM | POA: Diagnosis not present

## 2024-08-24 DIAGNOSIS — B9562 Methicillin resistant Staphylococcus aureus infection as the cause of diseases classified elsewhere: Secondary | ICD-10-CM | POA: Diagnosis not present

## 2024-08-24 DIAGNOSIS — I509 Heart failure, unspecified: Secondary | ICD-10-CM

## 2024-08-24 DIAGNOSIS — I5032 Chronic diastolic (congestive) heart failure: Secondary | ICD-10-CM

## 2024-08-24 HISTORY — PX: TRANSESOPHAGEAL ECHOCARDIOGRAM (CATH LAB): EP1270

## 2024-08-24 LAB — CULTURE, BLOOD (ROUTINE X 2)
Culture: NO GROWTH
Culture: NO GROWTH

## 2024-08-24 LAB — GLUCOSE, CAPILLARY
Glucose-Capillary: 194 mg/dL — ABNORMAL HIGH (ref 70–99)
Glucose-Capillary: 167 mg/dL — ABNORMAL HIGH (ref 70–99)
Glucose-Capillary: 198 mg/dL — ABNORMAL HIGH (ref 70–99)
Glucose-Capillary: 274 mg/dL — ABNORMAL HIGH (ref 70–99)

## 2024-08-24 LAB — BASIC METABOLIC PANEL WITH GFR
Anion gap: 10 (ref 5–15)
BUN: 24 mg/dL — ABNORMAL HIGH (ref 6–20)
CO2: 25 mmol/L (ref 22–32)
Calcium: 8.5 mg/dL — ABNORMAL LOW (ref 8.9–10.3)
Chloride: 98 mmol/L (ref 98–111)
Creatinine, Ser: 1.15 mg/dL (ref 0.61–1.24)
GFR, Estimated: >60 mL/min (ref >60)
Glucose, Bld: 224 mg/dL — ABNORMAL HIGH (ref 70–99)
Potassium: 4.7 mmol/L (ref 3.5–5.1)
Sodium: 133 mmol/L — ABNORMAL LOW (ref 135–145)

## 2024-08-24 LAB — ECHO TEE

## 2024-08-24 SURGERY — TRANSESOPHAGEAL ECHOCARDIOGRAM (TEE) (CATHLAB)
Anesthesia: Monitor Anesthesia Care

## 2024-08-24 MED ORDER — GABAPENTIN 300 MG PO CAPS
600.0000 mg | ORAL_CAPSULE | Freq: Three times a day (TID) | ORAL | Status: DC
  Administered 2024-08-24 – 2024-08-26 (×6): 600 mg via ORAL
  Filled 2024-08-24 (×6): qty 2

## 2024-08-24 MED ORDER — SODIUM CHLORIDE 0.9 % IV SOLN: INTRAVENOUS | Status: DC

## 2024-08-24 MED ORDER — INSULIN GLARGINE-YFGN 100 UNIT/ML ~~LOC~~ SOLN
20.0000 [IU] | Freq: Two times a day (BID) | SUBCUTANEOUS | Status: DC
Start: 2024-08-24 — End: 2024-08-25
  Administered 2024-08-24: 20 [IU] via SUBCUTANEOUS
  Filled 2024-08-24 (×3): qty 0.2

## 2024-08-24 MED ORDER — PROPOFOL 10 MG/ML IV BOLUS
INTRAVENOUS | Status: DC | PRN
  Administered 2024-08-24: 100 ug/kg/min via INTRAVENOUS
  Administered 2024-08-24: 30 mg via INTRAVENOUS
  Administered 2024-08-24: 70 mg via INTRAVENOUS

## 2024-08-24 NOTE — Plan of Care (Signed)

## 2024-08-24 NOTE — Progress Notes (Signed)
 Patient ID: Ryan [name], male   DOB: 1965/12/21, 58 y.o.   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  Patient does have inline flow to the ankle with multiphasic flow to the ankle.  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  Recommended proceeding with a transtibial amputation of the left to allow patient to regain function and improve the quality of his life.  Patient states he does not want to consider amputation at this time.  I discussed there are risks with long-term antibiotics.  Patient repeated multiple times he does not want to consider surgery.  I will follow-up as needed.

## 2024-08-24 NOTE — Progress Notes (Signed)
 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 Orthopedics recommended transtibial amputation that was initially declined.   Ryan [name] blood cultures have finalized without growth indicating clearance of bacteremia. Undergoing TEE today. Discussed plan of care to continue with current dose of Daptomycin with anticipated plan of 2 weeks IV with Daptomycin and 4 weeks with doxycycline . Therapeutic drug monitoring of CK levels. Orthopedic Surgery asked to re-evaluate for potential surgical intervention. Continue contact precautions for MRSA. OK for PICC placement prior to discharge.  Remaining medical and supportive care per Internal Medicine.   PLAN:  Continue current dose of daptomycin. Await TEE results.  OK for PICC placement with clearance of bacteremia.  Orthopedic follow up regarding surgical intervention.  Therapeutic drug monitoring of CK levels.  Wound care per Orthopedic recommendations.  Contact precautions for MRSA.  Remaining medical and supportive care per Internal Medicine.   Principal Problem:   Cellulitis of left lower extremity Active Problems:   Uncontrolled type 2 diabetes mellitus with hyperglycemia, without long-term current use of insulin  (HCC)   S/P BKA (below knee amputation) unilateral, right (HCC)   Tobacco use disorder   Chronic prescription opiate use - sees pain clinic in [place], [place].   GERD (gastroesophageal reflux disease)   CHF (congestive heart failure) (HCC)   COPD (chronic obstructive pulmonary disease) (HCC)    Osteomyelitis (HCC)    Chlorhexidine  Gluconate Cloth  6 each Topical Daily   enoxaparin (LOVENOX) injection  50 mg Subcutaneous Daily   gabapentin   300 mg Oral TID   insulin  aspart  0-15 Units Subcutaneous TID WC   insulin  aspart  0-5 Units Subcutaneous QHS   insulin  aspart  8 Units Subcutaneous TID WC   insulin  glargine  20 Units Subcutaneous BID   irbesartan  300 mg Oral Daily   mupirocin ointment  1 Application Nasal BID    SUBJECTIVE:  Afebrile overnight with no acute events. Having TEE today. Tolerating antibiotics with no adverse side effects.  Allergies  Allergen Reactions   Acetaminophen  Nausea And Vomiting     Review of Systems: Review of Systems  Constitutional:  Negative for chills, fever and weight loss.  Respiratory:  Negative for cough, shortness of breath and wheezing.   Cardiovascular:  Negative for chest pain and leg swelling.  Gastrointestinal:  Negative for abdominal pain, constipation, diarrhea, nausea and vomiting.  Skin:  Negative for rash.      OBJECTIVE: Vitals:   08/24/24 0414 08/24/24 0458 08/24/24 0811 08/24/24 1147  BP:  (!) 157/89  (!) 163/86  Pulse:  100 (!) 105   Resp:  15  19  Temp:  98.2 F (36.8 C) 98.4 F (36.9 C) 98.4 F (36.9 C)  TempSrc:  Oral Oral Oral  SpO2:  92%  96%  Weight: 100.5 kg     Height:       Body mass index is 29.23 kg/m.  Physical Exam Constitutional:      General: He is not in acute distress.    Appearance: He is well-developed.  Cardiovascular:  Rate and Rhythm: Normal rate and regular rhythm.     Heart sounds: Normal heart sounds.  Pulmonary:     Effort: Pulmonary effort is normal.     Breath sounds: Normal breath sounds.  Skin:    General: Skin is warm and dry.  Neurological:     Mental Status: He is alert.     Lab Results Lab Results  Component Value Date   WBC 7.5 08/20/2024   HGB 12.0 (L) 08/20/2024   HCT 40.6 08/20/2024   MCV 80.4 08/20/2024   PLT 317 08/20/2024    Lab  Results  Component Value Date   CREATININE 1.15 08/24/2024   BUN 24 (H) 08/24/2024   NA 133 (L) 08/24/2024   K 4.7 08/24/2024   CL 98 08/24/2024   CO2 25 08/24/2024    Lab Results  Component Value Date   ALT 11 08/17/2024   AST 12 (L) 08/17/2024   ALKPHOS 170 (H) 08/17/2024   BILITOT 0.6 08/17/2024     Microbiology: Recent Results (from the past 240 hours)  Blood culture (routine x 2)     Status: Abnormal   Collection Time: 08/17/24  1:00 AM   Specimen: BLOOD  Result Value Ref Range Status   Specimen Description BLOOD SITE NOT SPECIFIED  Final   Special Requests   Final    BOTTLES DRAWN AEROBIC AND ANAEROBIC Blood Culture adequate volume   Culture  Setup Time   Final    GRAM POSITIVE COCCI IN BOTH AEROBIC AND ANAEROBIC BOTTLES CRITICAL VALUE NOTED.  VALUE IS CONSISTENT WITH PREVIOUSLY REPORTED AND CALLED VALUE.    Culture (A)  Final    STAPHYLOCOCCUS AUREUS SUSCEPTIBILITIES PERFORMED ON PREVIOUS CULTURE WITHIN THE LAST 5 DAYS. Performed at [facility], [address] [place], [place] [address]    Report Status 08/19/2024 FINAL  Final  Blood culture (routine x 2)     Status: Abnormal (Preliminary result)   Collection Time: 08/17/24  1:05 AM   Specimen: BLOOD  Result Value Ref Range Status   Specimen Description BLOOD SITE NOT SPECIFIED  Final   Special Requests   Final    BOTTLES DRAWN AEROBIC AND ANAEROBIC Blood Culture adequate volume   Culture  Setup Time   Final    GRAM POSITIVE COCCI IN CLUSTERS IN BOTH AEROBIC AND ANAEROBIC BOTTLES CRITICAL RESULT CALLED TO, READ BACK BY AND VERIFIED WITH: PHARMD AUSTIN PAYTES ON 08/17/24 @ 1904 BY DRT    Culture (A)  Final    METHICILLIN RESISTANT STAPHYLOCOCCUS AUREUS Sent to [facility] for further susceptibility testing. Performed at [facility], [address] [place], [place] [address]    Report Status PENDING  Incomplete   Organism ID, Bacteria METHICILLIN RESISTANT STAPHYLOCOCCUS AUREUS  Final       Susceptibility   Methicillin resistant staphylococcus aureus - MIC*    CIPROFLOXACIN >=8 RESISTANT Resistant     ERYTHROMYCIN >=8 RESISTANT Resistant     GENTAMICIN <=0.5 SENSITIVE Sensitive     OXACILLIN >=4 RESISTANT Resistant     TETRACYCLINE <=1 SENSITIVE Sensitive     VANCOMYCIN  <=0.5 SENSITIVE Sensitive     TRIMETH/SULFA 80 RESISTANT Resistant     CLINDAMYCIN <=0.25 SENSITIVE Sensitive     RIFAMPIN <=0.5 SENSITIVE Sensitive     Inducible Clindamycin NEGATIVE Sensitive     LINEZOLID 2 SENSITIVE Sensitive     * METHICILLIN RESISTANT STAPHYLOCOCCUS AUREUS  Blood Culture ID Panel (Reflexed)     Status: Abnormal  Collection Time: 08/17/24  1:05 AM  Result Value Ref Range Status   Enterococcus faecalis NOT DETECTED NOT DETECTED Final   Enterococcus Faecium NOT DETECTED NOT DETECTED Final   Listeria monocytogenes NOT DETECTED NOT DETECTED Final   Staphylococcus species DETECTED (A) NOT DETECTED Final    Comment: CRITICAL RESULT CALLED TO, READ BACK BY AND VERIFIED WITH: PHARMD AUSTIN PAYTES ON 08/17/24 @ 1904 BY DRT    Staphylococcus aureus (BCID) DETECTED (A) NOT DETECTED Final    Comment: Methicillin (oxacillin)-resistant Staphylococcus aureus (MRSA). MRSA is predictably resistant to beta-lactam antibiotics (except ceftaroline). Preferred therapy is vancomycin  unless clinically contraindicated. Patient requires contact precautions if  hospitalized. CRITICAL RESULT CALLED TO, READ BACK BY AND VERIFIED WITH: PHARMD AUSTIN PAYTES ON 08/17/24 @ 1904 BY DRT    Staphylococcus epidermidis NOT DETECTED NOT DETECTED Final   Staphylococcus lugdunensis NOT DETECTED NOT DETECTED Final   Streptococcus species NOT DETECTED NOT DETECTED Final   Streptococcus agalactiae NOT DETECTED NOT DETECTED Final   Streptococcus pneumoniae NOT DETECTED NOT DETECTED Final   Streptococcus pyogenes NOT DETECTED NOT DETECTED Final   A.calcoaceticus-baumannii NOT DETECTED NOT DETECTED Final   Bacteroides  fragilis NOT DETECTED NOT DETECTED Final   Enterobacterales NOT DETECTED NOT DETECTED Final   Enterobacter cloacae complex NOT DETECTED NOT DETECTED Final   Escherichia coli NOT DETECTED NOT DETECTED Final   Klebsiella aerogenes NOT DETECTED NOT DETECTED Final   Klebsiella oxytoca NOT DETECTED NOT DETECTED Final   Klebsiella pneumoniae NOT DETECTED NOT DETECTED Final   Proteus species NOT DETECTED NOT DETECTED Final   Salmonella species NOT DETECTED NOT DETECTED Final   Serratia marcescens NOT DETECTED NOT DETECTED Final   Haemophilus influenzae NOT DETECTED NOT DETECTED Final   Neisseria meningitidis NOT DETECTED NOT DETECTED Final   Pseudomonas aeruginosa NOT DETECTED NOT DETECTED Final   Stenotrophomonas maltophilia NOT DETECTED NOT DETECTED Final   Candida albicans NOT DETECTED NOT DETECTED Final   Candida auris NOT DETECTED NOT DETECTED Final   Candida glabrata NOT DETECTED NOT DETECTED Final   Candida krusei NOT DETECTED NOT DETECTED Final   Candida parapsilosis NOT DETECTED NOT DETECTED Final   Candida tropicalis NOT DETECTED NOT DETECTED Final   Cryptococcus neoformans/gattii NOT DETECTED NOT DETECTED Final   Meth resistant mecA/C and MREJ DETECTED (A) NOT DETECTED Final    Comment: CRITICAL RESULT CALLED TO, READ BACK BY AND VERIFIED WITH: PHARMD AUSTIN PAYTES ON 08/17/24 @ 1904 BY DRT Performed at [facility], [address] [place], [place] [address]   MIC (1 Drug)-     Status: None   Collection Time: 08/17/24  1:05 AM  Result Value Ref Range Status   Min Inhibitory Conc (1 Drug) Final report  Corrected    Comment: (NOTE) Performed At: [facility] [address] [place], [place] [id number] Jennette Shorter MD Ey:[id number] CORRECTED ON 10/14 AT 1536: PREVIOUSLY REPORTED AS Preliminary report    Source DAPTOMYCIN MIC STAPH AUREUS BLOOD CULTURE  Final    Comment: Performed at [facility], [address], [place], [place] [address]  MIC Result      Status: None   Collection Time: 08/17/24  1:05 AM  Result Value Ref Range Status   Result 1 (MIC) Staphylococcus aureus  Final    Comment: (NOTE) Identification performed by account, not confirmed by this laboratory. Testing performed by gradient elution. DAPTOMYCIN  1.0 ug/mL SUSCEPTIBLE Performed At: [facility] [address] [place],  [id number] Jennette Shorter MD  Ey:[id number]   Culture, blood (Routine X 2) w Reflex to ID Panel     Status: None   Collection Time: 08/19/24  9:33 AM   Specimen: BLOOD RIGHT ARM  Result Value Ref Range Status   Specimen Description BLOOD RIGHT ARM  Final   Special Requests   Final    BOTTLES DRAWN AEROBIC AND ANAEROBIC Blood Culture results may not be optimal due to an inadequate volume of blood received in culture bottles   Culture   Final    NO GROWTH 5 DAYS Performed at [facility], [address], [place], [place] [address]    Report Status 08/24/2024 FINAL  Final  Culture, blood (Routine X 2) w Reflex to ID Panel     Status: None   Collection Time: 08/19/24  9:33 AM   Specimen: BLOOD RIGHT HAND  Result Value Ref Range Status   Specimen Description BLOOD RIGHT HAND  Final   Special Requests   Final    BOTTLES DRAWN AEROBIC AND ANAEROBIC Blood Culture results may not be optimal due to an inadequate volume of blood received in culture bottles   Culture   Final    NO GROWTH 5 DAYS Performed at [facility], [address] [place], [place] [address]    Report Status 08/24/2024 FINAL  Final  MRSA Next Gen by PCR, Nasal     Status: Abnormal   Collection Time: 08/23/24  8:00 AM   Specimen: Nasal Mucosa; Nasal Swab  Result Value Ref Range Status   MRSA by PCR Next Gen DETECTED (A) NOT DETECTED Final    Comment: RESULT CALLED TO, READ BACK BY AND VERIFIED WITH: RN EMERSON HALEY [phone] @ 1539 FH (NOTE) The GeneXpert MRSA Assay (FDA approved for NASAL specimens only), is one component of a comprehensive MRSA  colonization surveillance program. It is not intended to diagnose MRSA infection nor to guide or monitor treatment for MRSA infections. Test performance is not FDA approved in patients less than 46 years old. Performed at [facility], [address] [place], [place] [address]     I have personally spent 28 minutes involved in face-to-face and non-face-to-face activities for this patient on the day of the visit. Professional time spent includes the following activities: preparing to see the patient (review of tests), performing a medically appropriate examination, ordering medications, communicating with other health care professionals, documenting clinical information in the EMR, communicating results and counseling patient regarding medication and plan of care, and care coordination.    Greg Juaquin [place], NP [facility]  08/24/2024  1:04 PM
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
## 2024-08-24 NOTE — Interval H&P Note (Signed)
 History and Physical Interval Note:  08/24/2024 1:13 PM  Ryan [name]  has presented today for surgery, with the diagnosis of bacteremia.  The various methods of treatment have been discussed with the patient and family. After consideration of risks, benefits and other options for treatment, the patient has consented to  Procedure(s): TRANSESOPHAGEAL ECHOCARDIOGRAM (N/A) as a surgical intervention.  The patient's history has been reviewed, patient examined, no change in status, stable for surgery.  I have reviewed the patient's chart and labs.  Questions were answered to the patient's satisfaction.     [name]

## 2024-08-24 NOTE — Progress Notes (Signed)
 PROGRESS NOTE  [name] FMW:[id number] DOB: [birth year]   PCP: [name] [name], NP  Patient is from: Home.  DOA: 08/16/2024 LOS: 7  Chief complaints Chief Complaint  Patient presents with   Hyperglycemia   Open Wound     Brief Narrative / Interim history: 58 year old M with PMH of DM-2, right BKA in 02/2023 and tobacco use disorder presenting with worsening left foot redness, pain and swelling with purulent drainage for last 2 months and admitted with diabetic foot infection, and L foot osteomyelitis.  He has been following up with [facility], and sent to ED for further evaluation.  Left foot x-ray shows left fifth metatarsal joint changes consistent with osteomyelitis.  Patient was admitted and orthopedics was consulted,  started on empiric antibiotics.   Further workup showed MRSA bacteremia.  ID consulted and following.  Subjective: Seen and examined earlier this morning.  No major events overnight or this morning.  Reports chronic pain in his neck, back and leg.  No other complaints.  Objective: Vitals:   08/24/24 1313 08/24/24 1355 08/24/24 1404 08/24/24 1418  BP: (!) 150/96 96/66 105/73 124/74  Pulse: 92   91  Resp:  (!) 24 (!) 24 (!) 22  Temp: (!) 97.5 F (36.4 C) 97.8 F (36.6 C)  97.6 F (36.4 C)  TempSrc: Temporal Temporal  Oral  SpO2: 90% 96%  97%  Weight:      Height:        Examination:  GENERAL: No apparent distress.  Nontoxic. HEENT: MMM.  Vision and hearing grossly intact.  NECK: Supple.  No apparent JVD.  RESP:  No IWOB.  Fair aeration bilaterally. CVS:  RRR. Heart sounds normal.  ABD/GI/GU: BS+. Abd soft, NTND.  MSK/EXT: Right BKA.  Ace wrap over left foot.  Edema/stasis dermatitis proximally SKIN: As above. NEURO: AA.  Oriented appropriately.  No apparent focal neuro deficit. PSYCH: Calm. Normal affect.   Consultants:  Orthopedic surgery Infectious disease Cardiology  Procedures: None  Microbiology summarized: 10/8-blood  culture with MRSA. 10/10-blood cultures NGTD  Assessment and plan: Left foot Osteomyelitis Diabetic foot infection: MRSA bacteremia: -Worsening L foot wound with purulent drainage, swelling and pain x 2 months.  -X-ray concerning for osteomyelitis. -MRI-osteomyelitis/septic arthritis of the underlying fifth proximal phalanx and mid to distal fifth metatarsal. -Blood cultures with MRSA.  Repeat blood culture negative.  TTE and TEE negative for vegetation. -Zyvox 10/8>> vancomycin  10/8>> daptomycin 10/9>> -Zosyn 10/8>> Levaquin and Flagyl 10/11-10/14 -Ortho, Dr. Harden recommended left BKA. -Optimize glycemic control   Uncontrolled IDDM-2 with hyperglycemia and diabetic wound: A1c 13.2%. Recent Labs  Lab 08/23/24 1149 08/23/24 1629 08/23/24 2050 08/24/24 0645 08/24/24 1146  GLUCAP 283* 312* 223* 194* 167*  -Continue basal 20 units twice daily -Continue NovoLog  8 units 3 times daily with meals -Continue SSI-mod -Further adjustment as appropriate.   Chronic COPD: Stable -Breztri for home Trelegy. - DuoNebs as needed   Chronic HFpEF: TTE with LVEF of 60 to 65%, indeterminate DD and RVSP of 58.8.  Appears euvolemic but limited exam due to body habitus.  Has LLE edema.  On Bumex  at home.  Unclear if he is using -Continue holding Bumex  -Monitor fluid and respiratory status  Chronic pain with chronic prescription opiate use - sees pain clinic in [place], [place]. -Continue home oxycodone  -Increase gabapentin  from 300 to 600 mg 3 times daily.  Takes 800 mg 3 times daily at home -IV Dilaudid  as needed for severe pain.  S/P BKA: In the  setting of PVD, tobacco use and poorly controlled diabetes. -PT/OT eval  Tobacco use disorder: -Encourage smoking cessation -Nicotine patch.   GERD -continue PPI.      Body mass index is 29.23 kg/m.          DVT prophylaxis:  SCDs Start: 08/17/24 0224  Code Status: Full code Family Communication: None at the bedside Level of care:  Progressive Status is: Inpatient Remains inpatient appropriate because: MRSA bacteremia, left foot osteomyelitis/diabetic wound infection   Final disposition: Be determined   55 minutes with more than 50% spent in reviewing records, counseling patient/family and coordinating care.   Sch Meds:  Scheduled Meds:  Chlorhexidine  Gluconate Cloth  6 each Topical Daily   enoxaparin (LOVENOX) injection  50 mg Subcutaneous Daily   gabapentin   300 mg Oral TID   insulin  aspart  0-15 Units Subcutaneous TID WC   insulin  aspart  0-5 Units Subcutaneous QHS   insulin  aspart  8 Units Subcutaneous TID WC   insulin  glargine-yfgn  20 Units Subcutaneous BID   irbesartan  300 mg Oral Daily   mupirocin ointment  1 Application Nasal BID   Continuous Infusions:  DAPTOmycin 700 mg (08/23/24 1521)   PRN Meds:.acetaminophen  **OR** acetaminophen , HYDROmorphone  (DILAUDID ) injection, melatonin, ondansetron  (ZOFRAN ) IV, oxyCODONE   Antimicrobials: Anti-infectives (From admission, onward)    Start     Dose/Rate Route Frequency Ordered Stop   08/20/24 1500  levofloxacin (LEVAQUIN) tablet 750 mg  Status:  Discontinued        750 mg Oral Daily 08/20/24 1410 08/23/24 0916   08/20/24 1500  metroNIDAZOLE (FLAGYL) tablet 500 mg  Status:  Discontinued        500 mg Oral Every 12 hours 08/20/24 1410 08/23/24 0916   08/18/24 1600  DAPTOmycin (CUBICIN) IVPB 700 mg/100mL premix  Status:  Discontinued        6 mg/kg  108.9 kg 200 mL/hr over 30 Minutes Intravenous Daily 08/18/24 1519 08/18/24 1529   08/18/24 1600  DAPTOmycin (CUBICIN) IVPB 700 mg/130mL premix        8 mg/kg  91.5 kg (Adjusted) 200 mL/hr over 30 Minutes Intravenous Daily 08/18/24 1529     08/18/24 0930  vancomycin  (VANCOCIN ) IVPB 1000 mg/200 mL premix  Status:  Discontinued       Placed in Followed by Linked Group   1,000 mg 200 mL/hr over 60 Minutes Intravenous Every 12 hours 08/18/24 0916 08/18/24 1519   08/18/24 0715  vancomycin  (VANCOREADY) IVPB  1250 mg/250 mL  Status:  Discontinued       Placed in Followed by Linked Group   1,250 mg 166.7 mL/hr over 90 Minutes Intravenous Every 12 hours 08/17/24 1914 08/18/24 0916   08/17/24 1915  vancomycin  (VANCOREADY) IVPB 2000 mg/400 mL       Placed in Followed by Linked Group   2,000 mg 200 mL/hr over 120 Minutes Intravenous  Once 08/17/24 1914 08/17/24 2303   08/17/24 1000  linezolid (ZYVOX) IVPB 600 mg  Status:  Discontinued        600 mg 300 mL/hr over 60 Minutes Intravenous Every 12 hours 08/17/24 0228 08/17/24 1914   08/17/24 1000  piperacillin-tazobactam (ZOSYN) IVPB 3.375 g  Status:  Discontinued        3.375 g 12.5 mL/hr over 240 Minutes Intravenous Every 8 hours 08/17/24 0303 08/20/24 1409   08/17/24 0200  piperacillin-tazobactam (ZOSYN) IVPB 3.375 g        3.375 g 100 mL/hr over 30 Minutes Intravenous  Once 08/17/24 0158  08/17/24 0216   08/17/24 0200  linezolid (ZYVOX) IVPB 600 mg        600 mg 300 mL/hr over 60 Minutes Intravenous  Once 08/17/24 0158 08/17/24 0415        I have personally reviewed the following labs and images: CBC: Recent Labs  Lab 08/18/24 0640 08/20/24 0412  WBC 9.3 7.5  HGB 12.6* 12.0*  HCT 42.9 40.6  MCV 82.2 80.4  PLT 348 317   BMP &GFR Recent Labs  Lab 08/18/24 0640 08/20/24 0412 08/22/24 0845 08/24/24 0326  NA 133* 135 133* 133*  K 3.8 4.1 4.7 4.7  CL 97* 99 99 98  CO2 26 27 23 25   GLUCOSE 174* 234* 271* 224*  BUN 10 10 16  24*  CREATININE 1.09 1.05 1.02 1.15  CALCIUM 8.3* 8.2* 8.6* 8.5*  MG 1.7 1.6*  --   --   PHOS  --  3.7  --   --    Estimated Creatinine Clearance: 87.2 mL/min (by C-G formula based on SCr of 1.15 mg/dL). Liver & Pancreas: No results for input(s): AST, ALT, ALKPHOS, BILITOT, PROT, ALBUMIN in the last 168 hours. No results for input(s): LIPASE, AMYLASE in the last 168 hours. No results for input(s): AMMONIA in the last 168 hours. Diabetic: No results for input(s): HGBA1C in the last  72 hours. Recent Labs  Lab 08/23/24 1149 08/23/24 1629 08/23/24 2050 08/24/24 0645 08/24/24 1146  GLUCAP 283* 312* 223* 194* 167*   Cardiac Enzymes: Recent Labs  Lab 08/19/24 0933  CKTOTAL 57   No results for input(s): PROBNP in the last 8760 hours. Coagulation Profile: No results for input(s): INR, PROTIME in the last 168 hours. Thyroid Function Tests: No results for input(s): TSH, T4TOTAL, FREET4, T3FREE, THYROIDAB in the last 72 hours. Lipid Profile: No results for input(s): CHOL, HDL, LDLCALC, TRIG, CHOLHDL, LDLDIRECT in the last 72 hours. Anemia Panel: No results for input(s): VITAMINB12, FOLATE, FERRITIN, TIBC, IRON, RETICCTPCT in the last 72 hours. Urine analysis:    Component Value Date/Time   COLORURINE YELLOW 08/17/2024 0320   APPEARANCEUR CLEAR 08/17/2024 0320   LABSPEC 1.024 08/17/2024 0320   PHURINE 7.0 08/17/2024 0320   GLUCOSEU >=500 (A) 08/17/2024 0320   HGBUR SMALL (A) 08/17/2024 0320   BILIRUBINUR NEGATIVE 08/17/2024 0320   KETONESUR NEGATIVE 08/17/2024 0320   PROTEINUR >=300 (A) 08/17/2024 0320   NITRITE NEGATIVE 08/17/2024 0320   LEUKOCYTESUR NEGATIVE 08/17/2024 0320   Sepsis Labs: Invalid input(s): PROCALCITONIN, LACTICIDVEN  Microbiology: Recent Results (from the past 240 hours)  Blood culture (routine x 2)     Status: Abnormal   Collection Time: 08/17/24  1:00 AM   Specimen: BLOOD  Result Value Ref Range Status   Specimen Description BLOOD SITE NOT SPECIFIED  Final   Special Requests   Final    BOTTLES DRAWN AEROBIC AND ANAEROBIC Blood Culture adequate volume   Culture  Setup Time   Final    GRAM POSITIVE COCCI IN BOTH AEROBIC AND ANAEROBIC BOTTLES CRITICAL VALUE NOTED.  VALUE IS CONSISTENT WITH PREVIOUSLY REPORTED AND CALLED VALUE.    Culture (A)  Final    STAPHYLOCOCCUS AUREUS SUSCEPTIBILITIES PERFORMED ON PREVIOUS CULTURE WITHIN THE LAST 5 DAYS. Performed at [facility], [address], [place], [place] [address]    Report Status 08/19/2024 FINAL  Final  Blood culture (routine x 2)     Status: Abnormal (Preliminary result)   Collection Time: 08/17/24  1:05 AM   Specimen: BLOOD  Result Value Ref Range Status   Specimen Description BLOOD SITE NOT SPECIFIED  Final   Special Requests   Final    BOTTLES DRAWN AEROBIC AND ANAEROBIC Blood Culture adequate volume   Culture  Setup Time   Final    GRAM POSITIVE COCCI IN CLUSTERS IN BOTH AEROBIC AND ANAEROBIC BOTTLES CRITICAL RESULT CALLED TO, READ BACK BY AND VERIFIED WITH: PHARMD AUSTIN PAYTES ON 08/17/24 @ 1904 BY DRT    Culture (A)  Final    METHICILLIN RESISTANT STAPHYLOCOCCUS AUREUS Sent to [facility] for further susceptibility testing. Performed at [facility], [address], [place], [place] [address]    Report Status PENDING  Incomplete   Organism ID, Bacteria METHICILLIN RESISTANT STAPHYLOCOCCUS AUREUS  Final      Susceptibility   Methicillin resistant staphylococcus aureus - MIC*    CIPROFLOXACIN >=8 RESISTANT Resistant     ERYTHROMYCIN >=8 RESISTANT Resistant     GENTAMICIN <=0.5 SENSITIVE Sensitive     OXACILLIN >=4 RESISTANT Resistant     TETRACYCLINE <=1 SENSITIVE Sensitive     VANCOMYCIN  <=0.5 SENSITIVE Sensitive     TRIMETH/SULFA 80 RESISTANT Resistant     CLINDAMYCIN <=0.25 SENSITIVE Sensitive     RIFAMPIN <=0.5 SENSITIVE Sensitive     Inducible Clindamycin NEGATIVE Sensitive     LINEZOLID 2 SENSITIVE Sensitive     * METHICILLIN RESISTANT STAPHYLOCOCCUS AUREUS  Blood Culture ID Panel (Reflexed)     Status: Abnormal   Collection Time: 08/17/24  1:05 AM  Result Value Ref Range Status   Enterococcus faecalis NOT DETECTED NOT DETECTED Final   Enterococcus Faecium NOT DETECTED NOT DETECTED Final   Listeria monocytogenes NOT DETECTED NOT DETECTED Final   Staphylococcus species DETECTED (A) NOT DETECTED Final    Comment: CRITICAL RESULT CALLED TO, READ BACK BY AND VERIFIED WITH: PHARMD AUSTIN  PAYTES ON 08/17/24 @ 1904 BY DRT    Staphylococcus aureus (BCID) DETECTED (A) NOT DETECTED Final    Comment: Methicillin (oxacillin)-resistant Staphylococcus aureus (MRSA). MRSA is predictably resistant to beta-lactam antibiotics (except ceftaroline). Preferred therapy is vancomycin  unless clinically contraindicated. Patient requires contact precautions if  hospitalized. CRITICAL RESULT CALLED TO, READ BACK BY AND VERIFIED WITH: PHARMD AUSTIN PAYTES ON 08/17/24 @ 1904 BY DRT    Staphylococcus epidermidis NOT DETECTED NOT DETECTED Final   Staphylococcus lugdunensis NOT DETECTED NOT DETECTED Final   Streptococcus species NOT DETECTED NOT DETECTED Final   Streptococcus agalactiae NOT DETECTED NOT DETECTED Final   Streptococcus pneumoniae NOT DETECTED NOT DETECTED Final   Streptococcus pyogenes NOT DETECTED NOT DETECTED Final   A.calcoaceticus-baumannii NOT DETECTED NOT DETECTED Final   Bacteroides fragilis NOT DETECTED NOT DETECTED Final   Enterobacterales NOT DETECTED NOT DETECTED Final   Enterobacter cloacae complex NOT DETECTED NOT DETECTED Final   Escherichia coli NOT DETECTED NOT DETECTED Final   Klebsiella aerogenes NOT DETECTED NOT DETECTED Final   Klebsiella oxytoca NOT DETECTED NOT DETECTED Final   Klebsiella pneumoniae NOT DETECTED NOT DETECTED Final   Proteus species NOT DETECTED NOT DETECTED Final   Salmonella species NOT DETECTED NOT DETECTED Final   Serratia marcescens NOT DETECTED NOT DETECTED Final   Haemophilus influenzae NOT DETECTED NOT DETECTED Final   Neisseria meningitidis NOT DETECTED NOT DETECTED Final   Pseudomonas aeruginosa NOT DETECTED NOT DETECTED Final   Stenotrophomonas maltophilia NOT DETECTED NOT DETECTED Final   Candida albicans NOT DETECTED NOT DETECTED Final   Candida auris NOT DETECTED NOT DETECTED Final   Candida glabrata NOT DETECTED  NOT DETECTED Final   Candida krusei NOT DETECTED NOT DETECTED Final   Candida parapsilosis NOT DETECTED NOT DETECTED  Final   Candida tropicalis NOT DETECTED NOT DETECTED Final   Cryptococcus neoformans/gattii NOT DETECTED NOT DETECTED Final   Meth resistant mecA/C and MREJ DETECTED (A) NOT DETECTED Final    Comment: CRITICAL RESULT CALLED TO, READ BACK BY AND VERIFIED WITH: PHARMD AUSTIN PAYTES ON 08/17/24 @ 1904 BY DRT Performed at [facility], [address] [place], [place] [address]   MIC (1 Drug)-     Status: None   Collection Time: 08/17/24  1:05 AM  Result Value Ref Range Status   Min Inhibitory Conc (1 Drug) Final report  Corrected    Comment: (NOTE) Performed At: [facility] [address] [place], [place] [id number] Jennette Shorter MD Ey:[id number] CORRECTED ON 10/14 AT 1536: PREVIOUSLY REPORTED AS Preliminary report    Source DAPTOMYCIN MIC STAPH AUREUS BLOOD CULTURE  Final    Comment: Performed at [facility], [address], [place], [place] [address]  MIC Result     Status: None   Collection Time: 08/17/24  1:05 AM  Result Value Ref Range Status   Result 1 (MIC) Staphylococcus aureus  Final    Comment: (NOTE) Identification performed by account, not confirmed by this laboratory. Testing performed by gradient elution. DAPTOMYCIN  1.0 ug/mL SUSCEPTIBLE Performed At: [facility] [address] [place], [place] [id number] Jennette Shorter MD Ey:[id number]   Culture, blood (Routine X 2) w Reflex to ID Panel     Status: None   Collection Time: 08/19/24  9:33 AM   Specimen: BLOOD RIGHT ARM  Result Value Ref Range Status   Specimen Description BLOOD RIGHT ARM  Final   Special Requests   Final    BOTTLES DRAWN AEROBIC AND ANAEROBIC Blood Culture results may not be optimal due to an inadequate volume of blood received in culture bottles   Culture   Final    NO GROWTH 5 DAYS Performed at [facility], [address] [place], [place] [address]    Report Status 08/24/2024 FINAL  Final  Culture, blood (Routine X 2) w Reflex to ID Panel     Status:  None   Collection Time: 08/19/24  9:33 AM   Specimen: BLOOD RIGHT HAND  Result Value Ref Range Status   Specimen Description BLOOD RIGHT HAND  Final   Special Requests   Final    BOTTLES DRAWN AEROBIC AND ANAEROBIC Blood Culture results may not be optimal due to an inadequate volume of blood received in culture bottles   Culture   Final    NO GROWTH 5 DAYS Performed at [facility], [address] [place], [place] [address]    Report Status 08/24/2024 FINAL  Final  MRSA Next Gen by PCR, Nasal     Status: Abnormal   Collection Time: 08/23/24  8:00 AM   Specimen: Nasal Mucosa; Nasal Swab  Result Value Ref Range Status   MRSA by PCR Next Gen DETECTED (A) NOT DETECTED Final    Comment: RESULT CALLED TO, READ BACK BY AND VERIFIED WITH: RN EMERSON HALEY [phone] @ 1539 FH (NOTE) The GeneXpert MRSA Assay (FDA approved for NASAL specimens only), is one component of a comprehensive MRSA colonization surveillance program. It is not intended to diagnose MRSA infection nor to guide or monitor treatment for MRSA infections. Test performance is not FDA approved in patients less than 2 years  old. Performed at [facility], [address] [place], [place] [address]     Radiology Studies: ECHO TEE Result Date: 08/24/2024    TRANSESOPHOGEAL ECHO REPORT   Patient Name:   Ryan [name] Date of Exam: 08/24/2024 Medical Rec #:  [id number]        Height:       73.0 in Accession #:    [id number]       Weight:       221.6 lb Date of Birth:  1966/10/01        BSA:          2.247 m Patient Age:    58 years         BP:           159/90 mmHg Patient Gender: M                HR:           92 bpm. Exam Location:  Inpatient Procedure: Transesophageal Echo, Cardiac Doppler and Color Doppler (Both            Spectral and Color Flow Doppler were utilized during procedure). Indications:     Endocarditis  History:         Patient has prior history of Echocardiogram examinations, most                  recent  08/19/2024. CHF; Risk Factors:Diabetes.  Sonographer:     Jayson Gaskins Referring Phys:  [id number] TAYLOR A PARCELLS Diagnosing Phys: Vinie Maxcy MD PROCEDURE: After discussion of the risks and benefits of a TEE, an informed consent was obtained from the patient. The transesophogeal probe was passed without difficulty through the esophogus of the patient. Sedation performed by different physician. The patient was monitored while under deep sedation. Anesthestetic sedation was provided intravenously by Anesthesiology: 185mg  of Propofol . The patient developed no complications during the procedure.  IMPRESSIONS  1. Left ventricular ejection fraction, by estimation, is 60 to 65%. The left ventricle has normal function. The left ventricle has no regional wall motion abnormalities. There is mild left ventricular hypertrophy.  2. Right ventricular systolic function is normal. The right ventricular size is normal.  3. No left atrial/left atrial appendage thrombus was detected.  4. The mitral valve is normal in structure. Trivial mitral valve regurgitation.  5. Tricuspid valve regurgitation is moderate.  6. The aortic valve is tricuspid. Aortic valve regurgitation is not visualized.  7. Agitated saline contrast bubble study was negative, with no evidence of any interatrial shunt. Conclusion(s)/Recommendation(s): No evidence of vegetation/infective endocarditis on this transesophageael echocardiogram. FINDINGS  Left Ventricle: Left ventricular ejection fraction, by estimation, is 60 to 65%. The left ventricle has normal function. The left ventricle has no regional wall motion abnormalities. The left ventricular internal cavity size was normal in size. There is  mild left ventricular hypertrophy. Right Ventricle: The right ventricular size is normal. No increase in right ventricular wall thickness. Right ventricular systolic function is normal. Left [facility]: Left atrial size was normal in size. No left atrial/left atrial  appendage thrombus was detected. Right [facility]: Right atrial size was normal in size. Pericardium: There is no evidence of pericardial effusion. Mitral Valve: The mitral valve is normal in structure. Trivial mitral valve regurgitation. Tricuspid Valve: The tricuspid valve is normal in structure. Tricuspid valve regurgitation is moderate. Aortic Valve: The aortic valve is tricuspid. Aortic valve regurgitation is not visualized. Pulmonic Valve: The pulmonic valve was normal in  structure. Pulmonic valve regurgitation is trivial. Aorta: The aortic root, ascending aorta and aortic arch are all structurally normal, with no evidence of dilitation or obstruction. IAS/Shunts: The interatrial septum is aneurysmal. No atrial level shunt detected by color flow Doppler. Agitated saline contrast was given intravenously to evaluate for intracardiac shunting. Agitated saline contrast bubble study was negative, with no evidence of any interatrial shunt.  TRICUSPID VALVE TR Peak grad:   44.6 mmHg TR Vmax:        334.00 cm/s Vinie Maxcy MD Electronically signed by Vinie Maxcy MD Signature Date/Time: 08/24/2024/1:59:53 PM    Final (Updated)       Luverne Zerkle T. Shafiq Larch [facility]  If 7PM-7AM, please contact night-coverage [email or web address] 08/24/2024, 3:46 PM

## 2024-08-24 NOTE — Anesthesia Postprocedure Evaluation (Signed)
 Anesthesia Post Note  Patient: Ryan [name]  Procedure(s) Performed: TRANSESOPHAGEAL ECHOCARDIOGRAM     Patient location during evaluation: Cath Lab Anesthesia Type: MAC Level of consciousness: oriented, sedated and patient cooperative Pain management: pain level controlled Vital Signs Assessment: post-procedure vital signs reviewed and stable Respiratory status: spontaneous breathing, nonlabored ventilation and respiratory function stable Cardiovascular status: blood pressure returned to baseline and stable Postop Assessment: no apparent nausea or vomiting Anesthetic complications: no   No notable events documented.  Last Vitals:  Vitals:   08/24/24 1404 08/24/24 1418  BP: 105/73 124/74  Pulse:  91  Resp: (!) 24 (!) 22  Temp:  36.4 C  SpO2:  97%    Last Pain:  Vitals:   08/24/24 1437  TempSrc:   PainSc: 7                  Satoya Feeley,E. Joshalyn Ancheta

## 2024-08-24 NOTE — CV Procedure (Signed)
 TRANSESOPHAGEAL ECHOCARDIOGRAM (TEE) NOTE  INDICATIONS: infective endocarditis  PROCEDURE:   Informed consent was obtained prior to the procedure. The risks, benefits and alternatives for the procedure were discussed and the patient comprehended these risks.  Risks include, but are not limited to, cough, sore throat, vomiting, nausea, somnolence, esophageal and stomach trauma or perforation, bleeding, low blood pressure, aspiration, pneumonia, infection, trauma to the teeth and death.    After a procedural time-out, the patient was given propofol  for sedation by anesthesia. See their separate report.  The patient's heart rate, blood pressure, and oxygen saturation are monitored continuously during the procedure.The oropharynx was anesthetized with topical cetacaine.  The transesophageal probe was inserted in the esophagus and stomach without difficulty and multiple views were obtained.  The patient was kept under observation until the patient left the procedure room.  I was present face-to-face 100% of this time. The patient left the procedure room in stable condition.   Agitated microbubble saline contrast was administered.  COMPLICATIONS:    There were no immediate complications.  Findings:  LEFT VENTRICLE: The left ventricular wall thickness is mildly increased.  The left ventricular cavity is normal in size. Wall motion is normal.  LVEF is 60-65%.  RIGHT VENTRICLE:  The right ventricle is normal in structure and function without any thrombus or masses.    LEFT ATRIUM:  The left atrium is normal in size without any thrombus or masses.  There is not spontaneous echo contrast (smoke) in the left atrium consistent with a low flow state.  LEFT ATRIAL APPENDAGE:  The small  left atrial appendage is free of any thrombus or masses. The appendage has single lobes. Pulse doppler indicates moderate flow in the appendage.  ATRIAL SEPTUM:  The atrial septum is aneurysmal.  There is no  evidence for interatrial shunting by color doppler and saline microbubble.  RIGHT ATRIUM:  The right atrium is normal in size and function without any thrombus or masses.  MITRAL VALVE:  The mitral valve is normal in structure and function with trivial regurgitation.  There were no vegetations or stenosis.  AORTIC VALVE:  The aortic valve is trileaflet, normal in structure and function with no regurgitation.  There were no vegetations or stenosis  TRICUSPID VALVE:  The tricuspid valve is normal in structure and function with Moderate regurgitation.  RVSP is 45 mmHg + RAP. There were no vegetations or stenosis   PULMONIC VALVE:  The pulmonic valve is normal in structure and function with no regurgitation.  There were no vegetations or stenosis.   AORTIC ARCH, ASCENDING AND DESCENDING AORTA:  There was no Shaune et. Al, 1992) atherosclerosis of the ascending aorta, aortic arch, or proximal descending aorta.  12. PULMONARY VEINS: Anomalous pulmonary venous return was not noted.  13. PERICARDIUM: The pericardium appeared normal and non-thickened.  There is no pericardial effusion.  IMPRESSION:   No endocarditis No LAA thrombus Aneurysmal interatrial septum, without shunting Moderate TR RVSP 45 mm Hg + RAP LVEF 60-65%, normal wall motion  RECOMMENDATIONS:     Management of bacteremia per ID recommendations  Time Spent Directly with the Patient:  45 minutes   Ryan KYM Maxcy, MD, [facility], FNLA, FACP  [place]  [facility]  Medical Director of the [facility] Lipid Disorders &  Cardiovascular Risk Reduction Clinic Diplomate of the American Board of Clinical Lipidology Attending Cardiologist  Direct Dial: [phone]  Fax: [phone]  Website:  [email or web address] Ryan [name] Ryan [name] 08/24/2024, 1:47 PM

## 2024-08-24 NOTE — Transfer of Care (Signed)
 Immediate Anesthesia Transfer of Care Note  Patient: Ryan [name]  Procedure(s) Performed: TRANSESOPHAGEAL ECHOCARDIOGRAM  Patient Location: PACU and Cath Lab  Anesthesia Type:MAC  Level of Consciousness: drowsy  Airway & Oxygen Therapy: Patient Spontanous Breathing and Patient connected to nasal cannula oxygen  Post-op Assessment: Report given to RN and Post -op Vital signs reviewed and stable  Post vital signs: Reviewed and stable  Last Vitals:  Vitals Value Taken Time  BP 98/67 1351  Temp    Pulse 95   Resp 32   SpO2 96     Last Pain:  Vitals:   08/24/24 1313  TempSrc: Temporal  PainSc:       Patients Stated Pain Goal: 0 (08/23/24 1732)  Complications: No notable events documented.

## 2024-08-25 ENCOUNTER — Other Ambulatory Visit (HOSPITAL_COMMUNITY): Payer: Self-pay

## 2024-08-25 DIAGNOSIS — M869 Osteomyelitis, unspecified: Secondary | ICD-10-CM | POA: Diagnosis not present

## 2024-08-25 DIAGNOSIS — L03116 Cellulitis of left lower limb: Secondary | ICD-10-CM | POA: Diagnosis not present

## 2024-08-25 DIAGNOSIS — I5032 Chronic diastolic (congestive) heart failure: Secondary | ICD-10-CM | POA: Diagnosis not present

## 2024-08-25 DIAGNOSIS — E1165 Type 2 diabetes mellitus with hyperglycemia: Secondary | ICD-10-CM | POA: Diagnosis not present

## 2024-08-25 LAB — CBC
HCT: 42.4 % (ref 39.0–52.0)
Hemoglobin: 12.6 g/dL — ABNORMAL LOW (ref 13.0–17.0)
MCH: 24 pg — ABNORMAL LOW (ref 26.0–34.0)
MCHC: 29.7 g/dL — ABNORMAL LOW (ref 30.0–36.0)
MCV: 80.9 fL (ref 80.0–100.0)
Platelets: 381 K/uL (ref 150–400)
RBC: 5.24 MIL/uL (ref 4.22–5.81)
RDW: 19.4 % — ABNORMAL HIGH (ref 11.5–15.5)
WBC: 7 K/uL (ref 4.0–10.5)
nRBC: 0 % (ref 0.0–0.2)

## 2024-08-25 LAB — GLUCOSE, CAPILLARY
Glucose-Capillary: 265 mg/dL — ABNORMAL HIGH (ref 70–99)
Glucose-Capillary: 293 mg/dL — ABNORMAL HIGH (ref 70–99)
Glucose-Capillary: 264 mg/dL — ABNORMAL HIGH (ref 70–99)
Glucose-Capillary: 145 mg/dL — ABNORMAL HIGH (ref 70–99)

## 2024-08-25 LAB — RENAL FUNCTION PANEL
Albumin: 1.8 g/dL — ABNORMAL LOW (ref 3.5–5.0)
Anion gap: 9 (ref 5–15)
BUN: 25 mg/dL — ABNORMAL HIGH (ref 6–20)
CO2: 25 mmol/L (ref 22–32)
Calcium: 8.4 mg/dL — ABNORMAL LOW (ref 8.9–10.3)
Chloride: 98 mmol/L (ref 98–111)
Creatinine, Ser: 1.02 mg/dL (ref 0.61–1.24)
GFR, Estimated: >60 mL/min (ref >60)
Glucose, Bld: 276 mg/dL — ABNORMAL HIGH (ref 70–99)
Phosphorus: 4.4 mg/dL (ref 2.5–4.6)
Potassium: 4.6 mmol/L (ref 3.5–5.1)
Sodium: 132 mmol/L — ABNORMAL LOW (ref 135–145)

## 2024-08-25 LAB — MAGNESIUM: Magnesium: 1.8 mg/dL (ref 1.7–2.4)

## 2024-08-25 MED ORDER — HYDROMORPHONE HCL 1 MG/ML IJ SOLN
0.5000 mg | INTRAMUSCULAR | Status: DC | PRN
Start: 2024-08-25 — End: 2024-09-07
  Administered 2024-08-25 – 2024-09-07 (×66): 0.5 mg via INTRAVENOUS
  Filled 2024-08-25 (×67): qty 0.5

## 2024-08-25 MED ORDER — INSULIN ASPART 100 UNIT/ML IJ SOLN
0.0000 [IU] | Freq: Every day | INTRAMUSCULAR | Status: DC
  Administered 2024-08-26 – 2024-08-29 (×4): 3 [IU] via SUBCUTANEOUS
  Administered 2024-09-03 – 2024-09-13 (×5): 2 [IU] via SUBCUTANEOUS
  Filled 2024-08-25: qty 2
  Filled 2024-08-25: qty 3

## 2024-08-25 MED ORDER — INSULIN GLARGINE-YFGN 100 UNIT/ML ~~LOC~~ SOLN
25.0000 [IU] | Freq: Two times a day (BID) | SUBCUTANEOUS | Status: DC
  Administered 2024-08-25 – 2024-08-26 (×4): 25 [IU] via SUBCUTANEOUS
  Filled 2024-08-25 (×7): qty 0.25

## 2024-08-25 MED ORDER — HYDROMORPHONE HCL 1 MG/ML IJ SOLN: 0.5000 mg | INTRAMUSCULAR | Status: DC | PRN

## 2024-08-25 MED ORDER — BUMETANIDE 1 MG PO TABS
1.0000 mg | ORAL_TABLET | Freq: Every day | ORAL | Status: DC
  Administered 2024-08-25 – 2024-09-15 (×22): 1 mg via ORAL
  Filled 2024-08-25 (×25): qty 1

## 2024-08-25 MED ORDER — INSULIN ASPART 100 UNIT/ML IJ SOLN: 0.0000 [IU] | Freq: Three times a day (TID) | INTRAMUSCULAR | Status: DC

## 2024-08-25 MED ORDER — DOXYCYCLINE HYCLATE 100 MG PO TABS
100.0000 mg | ORAL_TABLET | Freq: Two times a day (BID) | ORAL | Status: DC
  Administered 2024-09-03 (×2): 100 mg via ORAL
  Filled 2024-08-25 (×2): qty 1

## 2024-08-25 MED ORDER — INSULIN ASPART 100 UNIT/ML IJ SOLN
0.0000 [IU] | Freq: Three times a day (TID) | INTRAMUSCULAR | Status: DC
  Administered 2024-08-25 (×2): 11 [IU] via SUBCUTANEOUS
  Administered 2024-08-26: 4 [IU] via SUBCUTANEOUS
  Administered 2024-08-26: 11 [IU] via SUBCUTANEOUS
  Administered 2024-08-26: 7 [IU] via SUBCUTANEOUS
  Administered 2024-08-27 – 2024-08-28 (×2): 11 [IU] via SUBCUTANEOUS
  Administered 2024-08-28: 7 [IU] via SUBCUTANEOUS
  Administered 2024-08-28: 15 [IU] via SUBCUTANEOUS
  Administered 2024-08-29 (×2): 11 [IU] via SUBCUTANEOUS
  Administered 2024-08-29: 4 [IU] via SUBCUTANEOUS
  Administered 2024-08-30: 3 [IU] via SUBCUTANEOUS
  Administered 2024-08-30 (×2): 4 [IU] via SUBCUTANEOUS
  Administered 2024-08-31: 15 [IU] via SUBCUTANEOUS
  Administered 2024-09-01: 20 [IU] via SUBCUTANEOUS
  Administered 2024-09-01: 11 [IU] via SUBCUTANEOUS
  Administered 2024-09-01: 7 [IU] via SUBCUTANEOUS
  Administered 2024-09-02 – 2024-09-04 (×4): 4 [IU] via SUBCUTANEOUS
  Administered 2024-09-04 – 2024-09-05 (×3): 3 [IU] via SUBCUTANEOUS
  Administered 2024-09-06: 4 [IU] via SUBCUTANEOUS
  Administered 2024-09-06 (×2): 3 [IU] via SUBCUTANEOUS
  Administered 2024-09-07: 7 [IU] via SUBCUTANEOUS
  Administered 2024-09-07 – 2024-09-08 (×2): 3 [IU] via SUBCUTANEOUS
  Administered 2024-09-11: 7 [IU] via SUBCUTANEOUS
  Administered 2024-09-11 – 2024-09-13 (×5): 4 [IU] via SUBCUTANEOUS
  Administered 2024-09-13: 11 [IU] via SUBCUTANEOUS
  Administered 2024-09-14: 4 [IU] via SUBCUTANEOUS
  Administered 2024-09-14: 3 [IU] via SUBCUTANEOUS
  Administered 2024-09-14: 4 [IU] via SUBCUTANEOUS
  Administered 2024-09-15: 7 [IU] via SUBCUTANEOUS
  Administered 2024-09-15 (×2): 4 [IU] via SUBCUTANEOUS
  Administered 2024-09-16: 3 [IU] via SUBCUTANEOUS
  Filled 2024-08-25: qty 4
  Filled 2024-08-25: qty 9
  Filled 2024-08-25: qty 4
  Filled 2024-08-25: qty 3
  Filled 2024-08-25: qty 7
  Filled 2024-08-25: qty 15
  Filled 2024-08-25 (×3): qty 4
  Filled 2024-08-25: qty 2
  Filled 2024-08-25: qty 11
  Filled 2024-08-25: qty 9
  Filled 2024-08-25: qty 4

## 2024-08-25 MED ORDER — DOXYCYCLINE HYCLATE 100 MG PO TABS
100.0000 mg | ORAL_TABLET | Freq: Two times a day (BID) | ORAL | 0 refills | Status: DC
  Filled 2024-08-25: qty 56, 28d supply, fill #0

## 2024-08-25 NOTE — Progress Notes (Signed)
 PROGRESS NOTE  Ryan [name] FMW:[id number] DOB: 10/26/1966   PCP: [name] [name], NP  Patient is from: Home.  DOA: 08/16/2024 LOS: 8  Chief complaints Chief Complaint  Patient presents with   Hyperglycemia   Open Wound     Brief Narrative / Interim history: 58 year old M with PMH of DM-2, right BKA in 02/2023 and tobacco use disorder presenting with worsening left foot redness, pain and swelling with purulent drainage for last 2 months and admitted with diabetic foot infection, and L foot osteomyelitis.  He has been [facility], and sent to ED for further evaluation.  Left foot x-ray shows left fifth metatarsal joint changes consistent with osteomyelitis.  Patient was admitted and orthopedics was consulted,  started on empiric antibiotics.   Further workup showed MRSA bacteremia.  ID consulted.  Now on daptomycin.  Orthopedic surgery recommended left BKA but patient refused.  ID recommended IV daptomycin for 2 weeks followed by doxycycline .  Now patient like to talk to Ortho about surgery.  Still unclear about left BKA  Subjective: Seen and examined earlier this morning.  No major events overnight or this morning.  No major events overnight or this morning.  Patient is still unclear about surgery.  He says I did not say I did not want surgery but I did not want my foot cut off.  Dr. Harden will be back on Monday to talk about surgery.   Objective: Vitals:   08/25/24 0407 08/25/24 0600 08/25/24 0850 08/25/24 1337  BP: 129/87  130/79 (!) 148/83  Pulse: 100  94 98  Resp: 16  16 18   Temp: 98.8 F (37.1 C)  98.2 F (36.8 C) 98.4 F (36.9 C)  TempSrc: Oral  Oral Oral  SpO2: 96%  94% 92%  Weight:  99.4 kg    Height:        Examination:  GENERAL: No apparent distress.  Nontoxic. HEENT: MMM.  Vision and hearing grossly intact.  NECK: Supple.  No apparent JVD.  RESP:  No IWOB.  Fair aeration bilaterally. CVS:  RRR. Heart sounds normal.  ABD/GI/GU:  BS+. Abd soft, NTND.  MSK/EXT: Right BKA.  Ace wrap over left foot.  Edema/stasis dermatitis proximally SKIN: As above. NEURO: AA.  Oriented appropriately.  No apparent focal neuro deficit. PSYCH: Calm. Normal affect.   Consultants:  Orthopedic surgery Infectious disease Cardiology  Procedures: None  Microbiology summarized: 10/8-blood culture with MRSA. 10/10-blood cultures NGTD  Assessment and plan: Left foot Osteomyelitis Diabetic foot infection: MRSA bacteremia: -Worsening L foot wound with purulent drainage, swelling and pain x 2 months.  -X-ray concerning for osteomyelitis. -MRI-osteomyelitis/septic arthritis of the underlying fifth proximal phalanx and mid to distal fifth metatarsal. -Blood cultures with MRSA.  Repeat blood culture negative.  TTE and TEE negative for vegetation. -Zyvox 10/8>> vancomycin  10/8>> daptomycin 10/9>> -Zosyn 10/8>> Levaquin and Flagyl 10/11-10/14 -Ortho recommended BKA.  He is undecided. Likes to talk to Dr. Harden again.  Dr. Harden aware. -ID recommends IV daptomycin until 10/24 followed by doxycycline  -Optimize glycemic control   Uncontrolled IDDM-2 with hyperglycemia and diabetic wound: A1c 13.2%. Recent Labs  Lab 08/24/24 1146 08/24/24 1634 08/24/24 2104 08/25/24 0600 08/25/24 1217  GLUCAP 167* 198* 274* 265* 293*  -Increase basal insulin  from 20 to 25 units twice daily -Continue NovoLog  8 units 3 times daily with meals -Increase SSI from moderate to resistant scale -Further adjustment as appropriate.   Chronic COPD: Stable -Breztri for home Trelegy. - DuoNebs as  needed   Chronic HFpEF: TTE with LVEF of 60 to 65%, indeterminate DD and RVSP of 58.8.  Appears euvolemic but limited exam due to body habitus.  Has LLE edema.  On Bumex  at home.  Unclear if he is using -Resume home Bumex  at 1 mg daily. -Monitor fluid and respiratory status  Chronic pain with chronic prescription opiate use - sees pain clinic in [place],  [place]. -Continue home oxycodone  -Increased gabapentin  from 300 to 600 mg 3 times daily.  Takes 800 mg 3 times daily at home -IV Dilaudid  as needed for severe pain.  S/P BKA: In the setting of PVD, tobacco use and poorly controlled diabetes. -PT/OT-declined.  Tobacco use disorder: -Encourage smoking cessation -Nicotine patch.   GERD -Continue PPI.      Body mass index is 28.91 kg/m.          DVT prophylaxis:  SCDs Start: 08/17/24 0224  Code Status: Full code Family Communication: None at the bedside Level of care: Med-Surg Status is: Inpatient Remains inpatient appropriate because: MRSA bacteremia, left foot osteomyelitis/diabetic wound infection   Final disposition: To be determined.   55 minutes with more than 50% spent in reviewing records, counseling patient/family and coordinating care.   Sch Meds:  Scheduled Meds:  bumetanide   1 mg Oral Daily   Chlorhexidine  Gluconate Cloth  6 each Topical Daily   [START ON 09/03/2024] doxycycline   100 mg Oral Q12H   enoxaparin (LOVENOX) injection  50 mg Subcutaneous Daily   gabapentin   600 mg Oral TID   insulin  aspart  0-20 Units Subcutaneous TID WC   insulin  aspart  0-5 Units Subcutaneous QHS   insulin  aspart  8 Units Subcutaneous TID WC   insulin  glargine-yfgn  25 Units Subcutaneous BID   irbesartan  300 mg Oral Daily   mupirocin ointment  1 Application Nasal BID   Continuous Infusions:  DAPTOmycin 700 mg (08/25/24 1312)   PRN Meds:.acetaminophen  **OR** acetaminophen , HYDROmorphone  (DILAUDID ) injection, melatonin, ondansetron  (ZOFRAN ) IV, oxyCODONE   Antimicrobials: Anti-infectives (From admission, onward)    Start     Dose/Rate Route Frequency Ordered Stop   09/03/24 1000  doxycycline  (VIBRA -TABS) tablet 100 mg        100 mg Oral Every 12 hours 08/25/24 0935 10/01/24 0959   09/03/24 0000  doxycycline  (VIBRA -TABS) 100 MG tablet        100 mg Oral 2 times daily 08/25/24 0938 10/01/24 2359   08/20/24 1500   levofloxacin (LEVAQUIN) tablet 750 mg  Status:  Discontinued        750 mg Oral Daily 08/20/24 1410 08/23/24 0916   08/20/24 1500  metroNIDAZOLE (FLAGYL) tablet 500 mg  Status:  Discontinued        500 mg Oral Every 12 hours 08/20/24 1410 08/23/24 0916   08/18/24 1600  DAPTOmycin (CUBICIN) IVPB 700 mg/146mL premix  Status:  Discontinued        6 mg/kg  108.9 kg 200 mL/hr over 30 Minutes Intravenous Daily 08/18/24 1519 08/18/24 1529   08/18/24 1600  DAPTOmycin (CUBICIN) IVPB 700 mg/143mL premix        8 mg/kg  91.5 kg (Adjusted) 200 mL/hr over 30 Minutes Intravenous Daily 08/18/24 1529 09/02/24 2359   08/18/24 0930  vancomycin  (VANCOCIN ) IVPB 1000 mg/200 mL premix  Status:  Discontinued       Placed in Followed by Linked Group   1,000 mg 200 mL/hr over 60 Minutes Intravenous Every 12 hours 08/18/24 0916 08/18/24 1519   08/18/24 0715  vancomycin  (VANCOREADY)  IVPB 1250 mg/250 mL  Status:  Discontinued       Placed in Followed by Linked Group   1,250 mg 166.7 mL/hr over 90 Minutes Intravenous Every 12 hours 08/17/24 1914 08/18/24 0916   08/17/24 1915  vancomycin  (VANCOREADY) IVPB 2000 mg/400 mL       Placed in Followed by Linked Group   2,000 mg 200 mL/hr over 120 Minutes Intravenous  Once 08/17/24 1914 08/17/24 2303   08/17/24 1000  linezolid (ZYVOX) IVPB 600 mg  Status:  Discontinued        600 mg 300 mL/hr over 60 Minutes Intravenous Every 12 hours 08/17/24 0228 08/17/24 1914   08/17/24 1000  piperacillin-tazobactam (ZOSYN) IVPB 3.375 g  Status:  Discontinued        3.375 g 12.5 mL/hr over 240 Minutes Intravenous Every 8 hours 08/17/24 0303 08/20/24 1409   08/17/24 0200  piperacillin-tazobactam (ZOSYN) IVPB 3.375 g        3.375 g 100 mL/hr over 30 Minutes Intravenous  Once 08/17/24 0158 08/17/24 0216   08/17/24 0200  linezolid (ZYVOX) IVPB 600 mg        600 mg 300 mL/hr over 60 Minutes Intravenous  Once 08/17/24 0158 08/17/24 0415        I have personally reviewed the  following labs and images: CBC: Recent Labs  Lab 08/20/24 0412 08/25/24 0353  WBC 7.5 7.0  HGB 12.0* 12.6*  HCT 40.6 42.4  MCV 80.4 80.9  PLT 317 381   BMP &GFR Recent Labs  Lab 08/20/24 0412 08/22/24 0845 08/24/24 0326 08/25/24 0353  NA 135 133* 133* 132*  K 4.1 4.7 4.7 4.6  CL 99 99 98 98  CO2 27 23 25 25   GLUCOSE 234* 271* 224* 276*  BUN 10 16 24* 25*  CREATININE 1.05 1.02 1.15 1.02  CALCIUM 8.2* 8.6* 8.5* 8.4*  MG 1.6*  --   --  1.8  PHOS 3.7  --   --  4.4   Estimated Creatinine Clearance: 97.9 mL/min (by C-G formula based on SCr of 1.02 mg/dL). Liver & Pancreas: Recent Labs  Lab 08/25/24 0353  ALBUMIN 1.8*   No results for input(s): LIPASE, AMYLASE in the last 168 hours. No results for input(s): AMMONIA in the last 168 hours. Diabetic: No results for input(s): HGBA1C in the last 72 hours. Recent Labs  Lab 08/24/24 1146 08/24/24 1634 08/24/24 2104 08/25/24 0600 08/25/24 1217  GLUCAP 167* 198* 274* 265* 293*   Cardiac Enzymes: Recent Labs  Lab 08/19/24 0933  CKTOTAL 57   No results for input(s): PROBNP in the last 8760 hours. Coagulation Profile: No results for input(s): INR, PROTIME in the last 168 hours. Thyroid Function Tests: No results for input(s): TSH, T4TOTAL, FREET4, T3FREE, THYROIDAB in the last 72 hours. Lipid Profile: No results for input(s): CHOL, HDL, LDLCALC, TRIG, CHOLHDL, LDLDIRECT in the last 72 hours. Anemia Panel: No results for input(s): VITAMINB12, FOLATE, FERRITIN, TIBC, IRON, RETICCTPCT in the last 72 hours. Urine analysis:    Component Value Date/Time   COLORURINE YELLOW 08/17/2024 0320   APPEARANCEUR CLEAR 08/17/2024 0320   LABSPEC 1.024 08/17/2024 0320   PHURINE 7.0 08/17/2024 0320   GLUCOSEU >=500 (A) 08/17/2024 0320   HGBUR SMALL (A) 08/17/2024 0320   BILIRUBINUR NEGATIVE 08/17/2024 0320   KETONESUR NEGATIVE 08/17/2024 0320   PROTEINUR >=300 (A) 08/17/2024 0320    NITRITE NEGATIVE 08/17/2024 0320   LEUKOCYTESUR NEGATIVE 08/17/2024 0320   Sepsis Labs: Invalid input(s): PROCALCITONIN, LACTICIDVEN  Microbiology:  Recent Results (from the past 240 hours)  Blood culture (routine x 2)     Status: Abnormal   Collection Time: 08/17/24  1:00 AM   Specimen: BLOOD  Result Value Ref Range Status   Specimen Description BLOOD SITE NOT SPECIFIED  Final   Special Requests   Final    BOTTLES DRAWN AEROBIC AND ANAEROBIC Blood Culture adequate volume   Culture  Setup Time   Final    GRAM POSITIVE COCCI IN BOTH AEROBIC AND ANAEROBIC BOTTLES CRITICAL VALUE NOTED.  VALUE IS CONSISTENT WITH PREVIOUSLY REPORTED AND CALLED VALUE.    Culture (A)  Final    STAPHYLOCOCCUS AUREUS SUSCEPTIBILITIES PERFORMED ON PREVIOUS CULTURE WITHIN THE LAST 5 DAYS. Performed at [facility], [address], [place], [place] [address]    Report Status 08/19/2024 FINAL  Final  Blood culture (routine x 2)     Status: Abnormal (Preliminary result)   Collection Time: 08/17/24  1:05 AM   Specimen: BLOOD  Result Value Ref Range Status   Specimen Description BLOOD SITE NOT SPECIFIED  Final   Special Requests   Final    BOTTLES DRAWN AEROBIC AND ANAEROBIC Blood Culture adequate volume   Culture  Setup Time   Final    GRAM POSITIVE COCCI IN CLUSTERS IN BOTH AEROBIC AND ANAEROBIC BOTTLES CRITICAL RESULT CALLED TO, READ BACK BY AND VERIFIED WITH: PHARMD AUSTIN PAYTES ON 08/17/24 @ 1904 BY DRT    Culture (A)  Final    METHICILLIN RESISTANT STAPHYLOCOCCUS AUREUS Sent to [facility] for further susceptibility testing. Performed at [facility], [address], [place], [place] [address]    Report Status PENDING  Incomplete   Organism ID, Bacteria METHICILLIN RESISTANT STAPHYLOCOCCUS AUREUS  Final      Susceptibility   Methicillin resistant staphylococcus aureus - MIC*    CIPROFLOXACIN >=8 RESISTANT Resistant     ERYTHROMYCIN >=8 RESISTANT Resistant     GENTAMICIN <=0.5  SENSITIVE Sensitive     OXACILLIN >=4 RESISTANT Resistant     TETRACYCLINE <=1 SENSITIVE Sensitive     VANCOMYCIN  <=0.5 SENSITIVE Sensitive     TRIMETH/SULFA 80 RESISTANT Resistant     CLINDAMYCIN <=0.25 SENSITIVE Sensitive     RIFAMPIN <=0.5 SENSITIVE Sensitive     Inducible Clindamycin NEGATIVE Sensitive     LINEZOLID 2 SENSITIVE Sensitive     * METHICILLIN RESISTANT STAPHYLOCOCCUS AUREUS  Blood Culture ID Panel (Reflexed)     Status: Abnormal   Collection Time: 08/17/24  1:05 AM  Result Value Ref Range Status   Enterococcus faecalis NOT DETECTED NOT DETECTED Final   Enterococcus Faecium NOT DETECTED NOT DETECTED Final   Listeria monocytogenes NOT DETECTED NOT DETECTED Final   Staphylococcus species DETECTED (A) NOT DETECTED Final    Comment: CRITICAL RESULT CALLED TO, READ BACK BY AND VERIFIED WITH: PHARMD AUSTIN PAYTES ON 08/17/24 @ 1904 BY DRT    Staphylococcus aureus (BCID) DETECTED (A) NOT DETECTED Final    Comment: Methicillin (oxacillin)-resistant Staphylococcus aureus (MRSA). MRSA is predictably resistant to beta-lactam antibiotics (except ceftaroline). Preferred therapy is vancomycin  unless clinically contraindicated. Patient requires contact precautions if  hospitalized. CRITICAL RESULT CALLED TO, READ BACK BY AND VERIFIED WITH: PHARMD AUSTIN PAYTES ON 08/17/24 @ 1904 BY DRT    Staphylococcus epidermidis NOT DETECTED NOT DETECTED Final   Staphylococcus lugdunensis NOT DETECTED NOT DETECTED Final   Streptococcus species NOT DETECTED NOT DETECTED Final   Streptococcus agalactiae NOT DETECTED NOT DETECTED Final   Streptococcus pneumoniae NOT  DETECTED NOT DETECTED Final   Streptococcus pyogenes NOT DETECTED NOT DETECTED Final   A.calcoaceticus-baumannii NOT DETECTED NOT DETECTED Final   Bacteroides fragilis NOT DETECTED NOT DETECTED Final   Enterobacterales NOT DETECTED NOT DETECTED Final   Enterobacter cloacae complex NOT DETECTED NOT DETECTED Final   Escherichia coli NOT  DETECTED NOT DETECTED Final   Klebsiella aerogenes NOT DETECTED NOT DETECTED Final   Klebsiella oxytoca NOT DETECTED NOT DETECTED Final   Klebsiella pneumoniae NOT DETECTED NOT DETECTED Final   Proteus species NOT DETECTED NOT DETECTED Final   Salmonella species NOT DETECTED NOT DETECTED Final   Serratia marcescens NOT DETECTED NOT DETECTED Final   Haemophilus influenzae NOT DETECTED NOT DETECTED Final   Neisseria meningitidis NOT DETECTED NOT DETECTED Final   Pseudomonas aeruginosa NOT DETECTED NOT DETECTED Final   Stenotrophomonas maltophilia NOT DETECTED NOT DETECTED Final   Candida albicans NOT DETECTED NOT DETECTED Final   Candida auris NOT DETECTED NOT DETECTED Final   Candida glabrata NOT DETECTED NOT DETECTED Final   Candida krusei NOT DETECTED NOT DETECTED Final   Candida parapsilosis NOT DETECTED NOT DETECTED Final   Candida tropicalis NOT DETECTED NOT DETECTED Final   Cryptococcus neoformans/gattii NOT DETECTED NOT DETECTED Final   Meth resistant mecA/C and MREJ DETECTED (A) NOT DETECTED Final    Comment: CRITICAL RESULT CALLED TO, READ BACK BY AND VERIFIED WITH: PHARMD AUSTIN PAYTES ON 08/17/24 @ 1904 BY DRT Performed at [facility], [address] [place], [place] [address]   MIC (1 Drug)-     Status: None   Collection Time: 08/17/24  1:05 AM  Result Value Ref Range Status   Min Inhibitory Conc (1 Drug) Final report  Corrected    Comment: (NOTE) Performed At: [facility] [address] [place], [place] [id number] Jennette Shorter MD Ey:[id number] CORRECTED ON 10/14 AT 1536: PREVIOUSLY REPORTED AS Preliminary report    Source DAPTOMYCIN MIC STAPH AUREUS BLOOD CULTURE  Final    Comment: Performed at [facility], [address] [place], [place] [address]  MIC Result     Status: None   Collection Time: 08/17/24  1:05 AM  Result Value Ref Range Status   Result 1 (MIC) Staphylococcus aureus  Final    Comment: (NOTE) Identification performed by  account, not confirmed by this laboratory. Testing performed by gradient elution. DAPTOMYCIN  1.0 ug/mL SUSCEPTIBLE Performed At: [facility] [address] [place], [place] [id number] Jennette Shorter MD Ey:[id number]   Culture, blood (Routine X 2) w Reflex to ID Panel     Status: None   Collection Time: 08/19/24  9:33 AM   Specimen: BLOOD RIGHT ARM  Result Value Ref Range Status   Specimen Description BLOOD RIGHT ARM  Final   Special Requests   Final    BOTTLES DRAWN AEROBIC AND ANAEROBIC Blood Culture results may not be optimal due to an inadequate volume of blood received in culture bottles   Culture   Final    NO GROWTH 5 DAYS Performed at [facility], [address] [place], [place] [address]    Report Status 08/24/2024 FINAL  Final  Culture, blood (Routine X 2) w Reflex to ID Panel     Status: None   Collection Time: 08/19/24  9:33 AM   Specimen: BLOOD RIGHT HAND  Result Value Ref Range Status   Specimen Description BLOOD RIGHT HAND  Final   Special Requests   Final    BOTTLES DRAWN AEROBIC  AND ANAEROBIC Blood Culture results may not be optimal due to an inadequate volume of blood received in culture bottles   Culture   Final    NO GROWTH 5 DAYS Performed at [facility], [address], [place], [place] [address]    Report Status 08/24/2024 FINAL  Final  MRSA Next Gen by PCR, Nasal     Status: Abnormal   Collection Time: 08/23/24  8:00 AM   Specimen: Nasal Mucosa; Nasal Swab  Result Value Ref Range Status   MRSA by PCR Next Gen DETECTED (A) NOT DETECTED Final    Comment: RESULT CALLED TO, READ BACK BY AND VERIFIED WITH: RN EMERSON HALEY [phone] @ 1539 FH (NOTE) The GeneXpert MRSA Assay (FDA approved for NASAL specimens only), is one component of a comprehensive MRSA colonization surveillance program. It is not intended to diagnose MRSA infection nor to guide or monitor treatment for MRSA infections. Test performance is not FDA approved in patients  less than 61 years old. Performed at [facility], [address], [place], [place] [address]     Radiology Studies: No results found.     Azalya Galyon T. Andrea Colglazier [facility]  If 7PM-7AM, please contact night-coverage [email or web address] 08/25/2024, 3:30 PM

## 2024-08-25 NOTE — Progress Notes (Signed)
 OT Cancellation Note  Patient Details Name: Ryan [name] MRN: [id number] DOB: 07/06/1966   Cancelled Treatment:    Reason Eval/Treat Not Completed: Pain limiting ability to participate. Pt declined reporting pain levels are too high, notified RN of pt's request for pain meds.    Sage Hammill C, OT  Acute Rehabilitation Services Office [phone] Secure chat preferred   Adrianne GORMAN Savers 08/25/2024, 11:39 AM

## 2024-08-25 NOTE — Progress Notes (Signed)
   08/25/24 1434  TOC Brief Assessment  Insurance and Status Reviewed  Patient has primary care physician Yes  Home environment has been reviewed home  Prior level of function: independent  Prior/Current Home Services No current home services  Social Drivers of Health Review SDOH reviewed no interventions necessary  Readmission risk has been reviewed Yes  Transition of care needs transition of care needs identified, TOC will continue to follow    PT/OT evals pending, Per ID pt will need home IV Abx- end date Oct. 24- OPAT in. Pam with [facility] home infusion following, pt still needs PICC placement.   Per notes pt still wanting to speak with Dr. Harden regarding possible surgery.  CM to follow for transition needs HH/IV abx pending final plans for surgery.

## 2024-08-25 NOTE — Progress Notes (Signed)
 PT Cancellation Note  Patient Details Name: Ryan [name] MRN: [id number] DOB: Jun 04, 1966   Cancelled Treatment:    Reason Eval/Treat Not Completed: Patient declined, no reason specified  Initially agreeable to evaluation, then refused. Provided hx. Pt states no point in working with therapy before a surgery. Educated on benefits of learning to safely mobilize pre-op if he decides to take that route. States he'll think about it and we can return another time.   Leontine Roads, PT, DPT [facility]  Rehabilitation Services Physical Therapist Office: [phone] Website: [email or web address]  Leontine GORMAN Roads 08/25/2024, 8:13 AM

## 2024-08-25 NOTE — Plan of Care (Signed)
  Problem: Education: Goal: Ability to describe self-care measures that may prevent or decrease complications (Diabetes Survival Skills Education) will improve Outcome: Progressing   Problem: Coping: Goal: Ability to adjust to condition or change in health will improve Outcome: Progressing   Problem: Fluid Volume: Goal: Ability to maintain a balanced intake and output will improve Outcome: Progressing   Problem: Metabolic: Goal: Ability to maintain appropriate glucose levels will improve Outcome: Progressing   Problem: Nutritional: Goal: Maintenance of adequate nutrition will improve Outcome: Progressing Goal: Progress toward achieving an optimal weight will improve Outcome: Progressing   

## 2024-08-25 NOTE — Progress Notes (Signed)
Report given to RN on 5N. All questions answered.

## 2024-08-25 NOTE — Progress Notes (Signed)
 PHARMACY CONSULT NOTE FOR:  OUTPATIENT  PARENTERAL ANTIBIOTIC THERAPY (OPAT)  Indication: MRSA bacteremia Regimen: Daptomycin 700 mg IV every 24 hours End date: 09/02/24  After Daptomycin, the plan will be for Doxycycline  100 mg po every 12 hours thru 11/21  IV antibiotic discharge orders are pended. To discharging provider:  please sign these orders via discharge navigator,  Select New Orders & click on the button choice - Manage This Unsigned Work.    Thank you for allowing pharmacy to be a part of this patient's care.  Almarie Lunger, PharmD, BCPS, BCIDP Infectious Diseases Clinical Pharmacist 08/25/2024 7:56 AM   **Pharmacist phone directory can now be found on [email or web address]).  Listed under [facility].

## 2024-08-26 ENCOUNTER — Other Ambulatory Visit (HOSPITAL_COMMUNITY): Payer: Self-pay

## 2024-08-26 DIAGNOSIS — M869 Osteomyelitis, unspecified: Secondary | ICD-10-CM | POA: Diagnosis not present

## 2024-08-26 DIAGNOSIS — I5032 Chronic diastolic (congestive) heart failure: Secondary | ICD-10-CM | POA: Diagnosis not present

## 2024-08-26 DIAGNOSIS — L03116 Cellulitis of left lower limb: Secondary | ICD-10-CM | POA: Diagnosis not present

## 2024-08-26 DIAGNOSIS — E1165 Type 2 diabetes mellitus with hyperglycemia: Secondary | ICD-10-CM | POA: Diagnosis not present

## 2024-08-26 LAB — CBC
HCT: 43.8 % (ref 39.0–52.0)
Hemoglobin: 13.1 g/dL (ref 13.0–17.0)
MCH: 24.1 pg — ABNORMAL LOW (ref 26.0–34.0)
MCHC: 29.9 g/dL — ABNORMAL LOW (ref 30.0–36.0)
MCV: 80.5 fL (ref 80.0–100.0)
Platelets: 410 K/uL — ABNORMAL HIGH (ref 150–400)
RBC: 5.44 MIL/uL (ref 4.22–5.81)
RDW: 19.7 % — ABNORMAL HIGH (ref 11.5–15.5)
WBC: 8.3 K/uL (ref 4.0–10.5)
nRBC: 0 % (ref 0.0–0.2)

## 2024-08-26 LAB — CULTURE, BLOOD (ROUTINE X 2): Special Requests: ADEQUATE

## 2024-08-26 LAB — GLUCOSE, CAPILLARY
Glucose-Capillary: 154 mg/dL — ABNORMAL HIGH (ref 70–99)
Glucose-Capillary: 265 mg/dL — ABNORMAL HIGH (ref 70–99)
Glucose-Capillary: 245 mg/dL — ABNORMAL HIGH (ref 70–99)
Glucose-Capillary: 271 mg/dL — ABNORMAL HIGH (ref 70–99)

## 2024-08-26 LAB — CK: Total CK: 719 U/L — ABNORMAL HIGH (ref 49–397)

## 2024-08-26 LAB — RENAL FUNCTION PANEL
Albumin: 1.9 g/dL — ABNORMAL LOW (ref 3.5–5.0)
Anion gap: 10 (ref 5–15)
BUN: 23 mg/dL — ABNORMAL HIGH (ref 6–20)
CO2: 26 mmol/L (ref 22–32)
Calcium: 8.8 mg/dL — ABNORMAL LOW (ref 8.9–10.3)
Chloride: 98 mmol/L (ref 98–111)
Creatinine, Ser: 0.99 mg/dL (ref 0.61–1.24)
GFR, Estimated: >60 mL/min (ref >60)
Glucose, Bld: 168 mg/dL — ABNORMAL HIGH (ref 70–99)
Phosphorus: 4.6 mg/dL (ref 2.5–4.6)
Potassium: 4 mmol/L (ref 3.5–5.1)
Sodium: 134 mmol/L — ABNORMAL LOW (ref 135–145)

## 2024-08-26 LAB — MAGNESIUM: Magnesium: 1.8 mg/dL (ref 1.7–2.4)

## 2024-08-26 MED ORDER — GABAPENTIN 400 MG PO CAPS
800.0000 mg | ORAL_CAPSULE | Freq: Three times a day (TID) | ORAL | Status: DC
  Administered 2024-08-26 – 2024-09-15 (×61): 800 mg via ORAL
  Filled 2024-08-26 (×65): qty 2

## 2024-08-26 NOTE — TOC Progression Note (Addendum)
 Transition of Care [facility]) - Progression Note    Patient Details  Name: Ryan [name] MRN: [id number] Date of Birth: May 26, 1966  Transition of [facility]) CM/SW Contact  Rosalva Jon Bloch, RN Phone Number: 08/26/2024, 10:07 AM  Clinical Narrative:    -Left foot Osteomyelitis,diabetic foot infection:MRSA bacteremia  Pt transferred from 4E to 5N on 1016/2025. Current NCM @ bedside with pt to discuss d/c plan. Pt states he wants to proceed with surgery and he is waiting to speak with the surgeon. Pam with [facility] for following potential IV ABX and HHRN need .  IP CM following and will assist with needs.   Expected Discharge Plan: Home w Home Health Services Barriers to Discharge: Continued Medical Work up               Expected Discharge Plan and Services   Discharge Planning Services: CM Consult   Living arrangements for the past 2 months: Single Family Home                           HH Arranged: IV Antibiotics, RN HH Agency: [facility] Date HH Agency Contacted: 08/25/24   Representative spoke with at [facility] Agency: Pam   Social Drivers of Health (SDOH) Interventions SDOH Screenings   Food Insecurity: Food Insecurity Present (08/21/2024)  Housing: Low Risk  (08/21/2024)  Transportation Needs: No Transportation Needs (08/21/2024)  Utilities: Not At Risk (08/21/2024)  Financial Resource Strain: High Risk (01/24/2021)   Received from [facility] visits prior to 01/10/2023.  Social Connections: Unknown (08/21/2024)  Tobacco Use: High Risk (08/16/2024)    Readmission Risk Interventions     No data to display

## 2024-08-26 NOTE — Progress Notes (Signed)
 Changed dressing to left foot and took photos and placed in patient's chart.  Cleansed with saline. Placed gauze, ABD, kerlex, and ace wrap. Patient tolerate well.

## 2024-08-26 NOTE — Evaluation (Signed)
 Physical Therapy Evaluation Patient Details Name: Ryan [name] MRN: [id number] DOB: October 21, 1966 Today's Date: 08/26/2024  History of Present Illness  Pt is a 58 y.o male admitted 08/16/24 for hyperglycemia and diabetic foot infection. Left foot x-ray shows fifth metatarsal joint changes consistent with osteomyelitis. MRI demonstrated osteomyelitis/septic arthritis of the underlying left fifth proximal phalanx and mid to distal fifth metatarsal. Blood cultures with MRSA. Repeat blood culture negative.  TTE and TEE negative for vegetation. Ortho recommending L BKA, pt initially declining but undecided would like to talk to Dr. Harden again. PMHx: R BKA, uncontrolled T2DM, chronic pain syndrome, CHF, COPD, PVD, and tobacco use.   Clinical Impression  Pt admitted with above diagnosis. PTA, pt was modI for functional mobility using a manual w/c, which he reports he's borrowing. Pt lives with friends in a one story house with a ramped entrance. Pt currently with functional limitations due to the deficits listed below (see PT Problem List). He required supervision for safety with bed mobility and anterior-posterior bed<>chair transfer. Pt is currently limited by pain, self-limited LLE weight bearing of NWB, and decreased activity tolerance. Pt will benefit from acute skilled PT to increase his independence and safety with mobility to allow discharge. Recommend continued inpatient follow up therapy, <3 hours/day.    If plan is discharge home, recommend the following: A little help with walking and/or transfers;A little help with bathing/dressing/bathroom;Assistance with cooking/housework;Assist for transportation;Help with stairs or ramp for entrance   Can travel by private vehicle   No    Equipment Recommendations Wheelchair (measurements PT);Wheelchair cushion (measurements PT);BSC/3in1 (removable arm rests)  Recommendations for Other Services       Functional Status Assessment Patient has had a  recent decline in their functional status and demonstrates the ability to make significant improvements in function in a reasonable and predictable amount of time.     Precautions / Restrictions Precautions Precautions: Fall Recall of Precautions/Restrictions: Intact Restrictions Weight Bearing Restrictions Per Provider Order: No Other Position/Activity Restrictions: Pt opted to maintain LLE NWB d/t pain.      Mobility  Bed Mobility Overal bed mobility: Needs Assistance Bed Mobility: Sit to Supine       Sit to supine: Supervision, HOB elevated   General bed mobility comments: Pt greeted seated EOB. He returned to bed by going down on his left shoulder and swinging BLE in.    Transfers Overall transfer level: Needs assistance Equipment used: None Transfers: Bed to chair/wheelchair/BSC         Anterior-Posterior transfers: Supervision   General transfer comment: Pt scooted backward/forward along bed using BUE support. He slowly transitioned between surfaces with cues for sequencing.    Ambulation/Gait               General Gait Details: Pt declined to attempt.  Stairs            Wheelchair Mobility     Tilt Bed    Modified Rankin (Stroke Patients Only)       Balance Overall balance assessment: Needs assistance Sitting-balance support: No upper extremity supported, Feet supported Sitting balance-Leahy Scale: Fair                                       Pertinent Vitals/Pain Pain Assessment Pain Assessment: 0-10 Pain Score: 7  Pain Location: LLE and Back Pain Descriptors / Indicators: Discomfort, Aching Pain Intervention(s): Monitored during session, Patient requesting  pain meds-RN notified, RN gave pain meds during session, Repositioned    Home Living Family/patient expects to be discharged to:: Private residence Living Arrangements: Non-relatives/Friends Available Help at Discharge: Friend(s);Available  PRN/intermittently Type of Home: House Home Access: Ramped entrance       Home Layout: One level Home Equipment: None Additional Comments: States he is borrowing a wheelchair    Prior Function Prior Level of Function : Independent/Modified Independent             Mobility Comments: Mobilizes using w/c d/t L foot pain. Transfers via stand pivot. Denies fall hx. ADLs Comments: Indep with ADLs/IADLs.     Extremity/Trunk Assessment   Upper Extremity Assessment Upper Extremity Assessment: Defer to OT evaluation    Lower Extremity Assessment Lower Extremity Assessment: LLE deficits/detail LLE Deficits / Details: Pt's L foot is bandaged. Decreased knee and ankle/foot AROM. Grossly 3-/5 strength. LLE: Unable to fully assess due to pain LLE Sensation: history of peripheral neuropathy LLE Coordination: decreased gross motor    Cervical / Trunk Assessment Cervical / Trunk Assessment: Other exceptions Cervical / Trunk Exceptions: Increased Body Habitus; Thoracic Rounding  Communication   Communication Communication: No apparent difficulties    Cognition Arousal: Alert Behavior During Therapy: WFL for tasks assessed/performed   PT - Cognitive impairments: No apparent impairments                       PT - Cognition Comments: Pt A,Ox4 Following commands: Intact       Cueing Cueing Techniques: Verbal cues     General Comments General comments (skin integrity, edema, etc.): VSS on RA    Exercises     Assessment/Plan    PT Assessment Patient needs continued PT services  PT Problem List Decreased strength;Decreased range of motion;Decreased activity tolerance;Decreased balance;Decreased mobility;Decreased safety awareness;Pain       PT Treatment Interventions DME instruction;Gait training;Functional mobility training;Therapeutic activities;Therapeutic exercise;Balance training;Patient/family education;Wheelchair mobility training    PT Goals (Current  goals can be found in the Care Plan section)  Acute Rehab PT Goals Patient Stated Goal: Regain independence PT Goal Formulation: With patient Time For Goal Achievement: 09/09/24 Potential to Achieve Goals: Good Additional Goals Additional Goal #1: Pt will propel manual w/c ~210ft with supervision.    Frequency Min 2X/week     Co-evaluation               AM-PAC PT 6 Clicks Mobility  Outcome Measure Help needed turning from your back to your side while in a flat bed without using bedrails?: A Little Help needed moving from lying on your back to sitting on the side of a flat bed without using bedrails?: A Little Help needed moving to and from a bed to a chair (including a wheelchair)?: A Little Help needed standing up from a chair using your arms (e.g., wheelchair or bedside chair)?: Total Help needed to walk in hospital room?: Total Help needed climbing 3-5 steps with a railing? : Total 6 Click Score: 12    End of Session Equipment Utilized During Treatment: Other (comment) (pt declined gait belt) Activity Tolerance: Patient tolerated treatment well Patient left: in bed;with call bell/phone within reach;with bed alarm set Nurse Communication: Mobility status PT Visit Diagnosis: Difficulty in walking, not elsewhere classified (R26.2);Pain;Other abnormalities of gait and mobility (R26.89);Unsteadiness on feet (R26.81) Pain - Right/Left: Left Pain - part of body: Ankle and joints of foot    Time: 0753-0820 PT Time Calculation (min) (ACUTE ONLY): 27  min   Charges:   PT Evaluation $PT Eval Moderate Complexity: 1 Mod   PT General Charges $$ ACUTE PT VISIT: 1 Visit         Randall SAUNDERS, PT, DPT Acute Rehabilitation Services Office: [phone] Secure Chat Preferred  Ryan [name] 08/26/2024, 8:36 AM

## 2024-08-26 NOTE — Plan of Care (Signed)
  Problem: Education: Goal: Ability to describe self-care measures that may prevent or decrease complications (Diabetes Survival Skills Education) will improve Outcome: Progressing   Problem: Coping: Goal: Ability to adjust to condition or change in health will improve Outcome: Progressing

## 2024-08-26 NOTE — Evaluation (Signed)
 Occupational Therapy Evaluation Patient Details Name: Ryan [name] MRN: [id number] DOB: 11/19/1965 Today's Date: 08/26/2024   History of Present Illness   Pt is a 58 y.o male admitted 08/16/24 for hyperglycemia and diabetic foot infection. Left foot x-ray shows fifth metatarsal joint changes consistent with osteomyelitis. MRI demonstrated osteomyelitis/septic arthritis of the underlying left fifth proximal phalanx and mid to distal fifth metatarsal. Blood cultures with MRSA. Repeat blood culture negative.  TTE and TEE negative for vegetation. Ortho recommending L BKA, pt initially declining but undecided would like to talk to Dr. Harden again. PMHx: R BKA, uncontrolled T2DM, chronic pain syndrome, CHF, COPD, PVD, and tobacco use.     Clinical Impressions PTA Pt was Mod I with functional mobility using borrowed wheelchair, and independent with ADL/IADL tasks. Pt currently requiring overall Mod A for ADL engagement. Pt declining OOB mobility this session despite endorsing need to have bowel movement. Therapist provided options for transfer to standard BSC, to wheelchair and then toilet, or use of bed pan. Pt stating he did not want bed pan and would not fit on toilet. Pt requesting to wait until ordered bariatric BSC has been brought to room. Pt is primarily limited by pain, abnormalities of gait, decreased ability to weight shift, and self-limiting behaviors. Pt will benefit from acute skilled OT to facilitate progress towards goals. Patient will benefit from continued inpatient follow up therapy, <3 hours/day.       If plan is discharge home, recommend the following:   A lot of help with walking and/or transfers;A little help with bathing/dressing/bathroom;Assistance with cooking/housework;Assist for transportation;Help with stairs or ramp for entrance     Functional Status Assessment   Patient has had a recent decline in their functional status and demonstrates the ability to make  significant improvements in function in a reasonable and predictable amount of time.     Equipment Recommendations   Other (comment);Wheelchair (measurements OT);BSC/3in1 (drop arm BSC)     Recommendations for Other Services         Precautions/Restrictions   Precautions Precautions: Fall Recall of Precautions/Restrictions: Intact Restrictions Weight Bearing Restrictions Per Provider Order: No     Mobility Bed Mobility Overal bed mobility: Needs Assistance Bed Mobility: Sit to Supine       Sit to supine: Supervision, HOB elevated   General bed mobility comments: Pt greeted in supine, sat EOB with supervision and able to maintain sitting balance. Returned to supine with supervision.    Transfers                   General transfer comment: Pt declining OOB activity, no transfer completed. Per chart review, Pt able to scoot backwards/forwards along bed with BUE support. Could complete anterior-posterior transfer with supervision.      Balance Overall balance assessment: Needs assistance Sitting-balance support: No upper extremity supported, Feet supported Sitting balance-Leahy Scale: Fair Sitting balance - Comments: Pt unable to maintain dynamic sitting balance outside BOS without support from one UE.       Standing balance comment: Not assessed                           ADL either performed or assessed with clinical judgement   ADL Overall ADL's : Needs assistance/impaired Eating/Feeding: Independent   Grooming: Independent;Sitting   Upper Body Bathing: Sitting;Independent   Lower Body Bathing: Moderate assistance;Sitting/lateral leans   Upper Body Dressing : Independent;Sitting   Lower Body Dressing: Moderate assistance;Sitting/lateral leans  General ADL Comments: Pt expressing need to have BM. Therapist offered assistance to transfer to [facility], toilet, or utilize bedpan as bariatric BSC has been ordered but has  not arrived. Pt declining all other options that do not include bariatric BSC.     Vision Patient Visual Report: No change from baseline Vision Assessment?: No apparent visual deficits     Perception         Praxis         Pertinent Vitals/Pain Pain Assessment Pain Assessment: 0-10 Pain Score: 7  Pain Location: lower back and neck Pain Descriptors / Indicators: Discomfort, Grimacing Pain Intervention(s): Monitored during session, Premedicated before session     Extremity/Trunk Assessment Upper Extremity Assessment Upper Extremity Assessment: Overall WFL for tasks assessed   Lower Extremity Assessment Lower Extremity Assessment: Defer to PT evaluation LLE Deficits / Details: Pt's L foot is bandaged. Decreased knee and ankle/foot AROM. Grossly 3-/5 strength. LLE: Unable to fully assess due to pain LLE Sensation: history of peripheral neuropathy LLE Coordination: decreased gross motor   Cervical / Trunk Assessment Cervical / Trunk Assessment: Other exceptions Cervical / Trunk Exceptions: Increased Body Habitus; Thoracic Rounding   Communication Communication Communication: No apparent difficulties   Cognition Arousal: Alert Behavior During Therapy: WFL for tasks assessed/performed, Agitated Cognition: No apparent impairments                               Following commands: Intact       Cueing  General Comments   Cueing Techniques: Verbal cues  VSS on RA   Exercises     Shoulder Instructions      Home Living Family/patient expects to be discharged to:: Private residence Living Arrangements: Non-relatives/Friends Available Help at Discharge: Friend(s);Available PRN/intermittently Type of Home: House Home Access: Ramped entrance     Home Layout: One level     Bathroom Shower/Tub: Producer, television/film/video: Standard Bathroom Accessibility: Yes   Home Equipment: None   Additional Comments: Pt is borrowing wheelchair. Pt  reported that he is trying to get a drop arm BSC for home because he does not fit on his toilet. Unsure of current home bathroom set-up.      Prior Functioning/Environment Prior Level of Function : Independent/Modified Independent             Mobility Comments: Mobilizes using w/c d/t L foot pain. Transfers via stand pivot. Denies fall hx. ADLs Comments: Indep with ADLs/IADLs.    OT Problem List: Decreased activity tolerance;Impaired balance (sitting and/or standing);Decreased safety awareness;Decreased knowledge of use of DME or AE;Pain   OT Treatment/Interventions: Self-care/ADL training;Therapeutic exercise;Energy conservation;DME and/or AE instruction;Therapeutic activities;Patient/family education;Balance training      OT Goals(Current goals can be found in the care plan section)   Acute Rehab OT Goals Patient Stated Goal: use the bathroom OT Goal Formulation: With patient Time For Goal Achievement: 09/09/24 Potential to Achieve Goals: Good ADL Goals Pt Will Perform Lower Body Bathing: with supervision;with adaptive equipment;sitting/lateral leans Pt Will Perform Lower Body Dressing: with supervision;with adaptive equipment;sitting/lateral leans Pt Will Transfer to Toilet: bedside commode;with modified independence Pt Will Perform Tub/Shower Transfer: Shower transfer;with modified independence;3 in 1   OT Frequency:  Min 2X/week    Co-evaluation              AM-PAC OT 6 Clicks Daily Activity     Outcome Measure Help from another person eating meals?: None Help from another  person taking care of personal grooming?: None Help from another person toileting, which includes using toliet, bedpan, or urinal?: A Lot Help from another person bathing (including washing, rinsing, drying)?: A Lot Help from another person to put on and taking off regular upper body clothing?: None Help from another person to put on and taking off regular lower body clothing?: A Lot 6  Click Score: 18   End of Session    Activity Tolerance: Patient limited by pain Patient left: in bed;with call bell/phone within reach;with bed alarm set  OT Visit Diagnosis: Other abnormalities of gait and mobility (R26.89);Muscle weakness (generalized) (M62.81);Pain Pain - Right/Left: Left Pain - part of body: Leg                Time: 9158-9143 OT Time Calculation (min): 15 min Charges:  OT General Charges $OT Visit: 1 Visit OT Evaluation $OT Eval Low Complexity: 1 Low  Maurilio CROME, OTR/L.  [facility] Acute Rehabilitation  Office: [phone]   Maurilio PARAS Trindon Dorton 08/26/2024, 10:11 AM

## 2024-08-26 NOTE — Progress Notes (Signed)
 PROGRESS NOTE  Ryan [name]:[id number] DOB: 08/18/1966   PCP: [name] [name], NP  Patient is from: Home.  DOA: 08/16/2024 LOS: 9  Chief complaints Chief Complaint  Patient presents with   Hyperglycemia   Open Wound     Brief Narrative / Interim history: 58 year old M with PMH of DM-2, right BKA in 02/2023 and tobacco use disorder presenting with worsening left foot redness, pain and swelling with purulent drainage for last 2 months and admitted with diabetic foot infection, and L foot osteomyelitis.  He has been following up with [facility], and sent to ED for further evaluation.  Left foot x-ray shows left fifth metatarsal joint changes consistent with osteomyelitis.  Patient was admitted and orthopedics was consulted,  started on empiric antibiotics.   Further workup showed MRSA bacteremia.  ID consulted.  Now on daptomycin.  Orthopedic surgery recommended left BKA but patient refused.  ID recommended IV daptomycin for 2 weeks followed by doxycycline .  Now patient considering surgery.  Dr. Harden to post him for Wednesday.  Subjective: Seen and examined earlier this morning.  No major events overnight or this morning.  No complaints other than limited option on his diet due to restrictions.  Objective: Vitals:   08/25/24 1628 08/25/24 1940 08/26/24 0500 08/26/24 0528  BP: (!) 159/84 112/71  132/67  Pulse: 99 (!) 101  90  Resp: 19 15  17   Temp: 97.7 F (36.5 C) 99.2 F (37.3 C)  98.4 F (36.9 C)  TempSrc: Oral Oral  Oral  SpO2: 94% 94%  95%  Weight:   100.6 kg   Height:        Examination:  GENERAL: No apparent distress.  Nontoxic. HEENT: MMM.  Vision and hearing grossly intact.  NECK: Supple.  No apparent JVD.  RESP:  No IWOB.  Fair aeration bilaterally. CVS:  RRR. Heart sounds normal.  ABD/GI/GU: BS+. Abd soft, NTND.  MSK/EXT: Right BKA.  Ace wrap over left foot.  Edema/stasis dermatitis proximally SKIN: As above. NEURO: AA.  Oriented  appropriately.  No apparent focal neuro deficit. PSYCH: Calm. Normal affect.   Consultants:  Orthopedic surgery Infectious disease Cardiology  Procedures: None  Microbiology summarized: 10/8-blood culture with MRSA. 10/10-blood cultures NGTD  Assessment and plan: Left foot Osteomyelitis Diabetic foot infection: MRSA bacteremia: -Worsening L foot wound with purulent drainage, swelling and pain x 2 months.  -X-ray concerning for osteomyelitis. -MRI-osteomyelitis/septic arthritis of the underlying fifth proximal phalanx and mid to distal fifth metatarsal. -Blood cultures with MRSA.  Repeat blood culture negative.  TTE and TEE negative for vegetation. -Zyvox 10/8>> vancomycin  10/8>> daptomycin 10/9>> -Zosyn 10/8>> Levaquin and Flagyl 10/11-10/14 -ID recommends IV daptomycin until 10/24 followed by doxycycline  if no BKA -Initially refused BKA but changed his mind.  Likely on 10/22 per Dr. Harden -Optimize glycemic control   Uncontrolled IDDM-2 with hyperglycemia and diabetic wound: A1c 13.2%. Recent Labs  Lab 08/25/24 1217 08/25/24 1722 08/25/24 2104 08/26/24 0527 08/26/24 1137  GLUCAP 293* 264* 145* 154* 265*  -Continue basal insulin  25 units twice daily -Continue NovoLog  8 units 3 times daily with meals -Continue SSI-resistant scale -Carb modified diet -Further adjustment as appropriate.   Chronic COPD: Stable -Breztri for home Trelegy. - DuoNebs as needed   Chronic HFpEF: TTE with LVEF of 60 to 65%, indeterminate DD and RVSP of 58.8.  Appears euvolemic but limited exam due to body habitus.  Has LLE edema.  Unclear if he is taking Bumex  at home.  Started  here.  Excellent urine output. -Continue Bumex  1 mg daily -Monitor fluid and respiratory status  Chronic pain with chronic prescription opiate use - sees pain clinic in [place], [place]. -Continue home oxycodone  -Increase gabapentin  to home dose. -IV Dilaudid  as needed for severe pain.  S/P BKA: In the setting of PVD,  tobacco use and poorly controlled diabetes. -PT/OT  Tobacco use disorder: -Encourage smoking cessation -Nicotine patch.   Hyponatremia: Mild.  Improved.  GERD -Continue PPI.      Body mass index is 29.26 kg/m.          DVT prophylaxis:  SCDs Start: 08/17/24 0224  Code Status: Full code Family Communication: None at the bedside Level of care: Med-Surg Status is: Inpatient Remains inpatient appropriate because: MRSA bacteremia, left foot osteomyelitis/diabetic wound infection   Final disposition: To be determined after surgery.   35 minutes with more than 50% spent in reviewing records, counseling patient/family and coordinating care.   Sch Meds:  Scheduled Meds:  bumetanide   1 mg Oral Daily   Chlorhexidine  Gluconate Cloth  6 each Topical Daily   [START ON 09/03/2024] doxycycline   100 mg Oral Q12H   enoxaparin (LOVENOX) injection  50 mg Subcutaneous Daily   gabapentin   600 mg Oral TID   insulin  aspart  0-20 Units Subcutaneous TID WC   insulin  aspart  0-5 Units Subcutaneous QHS   insulin  aspart  8 Units Subcutaneous TID WC   insulin  glargine-yfgn  25 Units Subcutaneous BID   irbesartan  300 mg Oral Daily   mupirocin ointment  1 Application Nasal BID   Continuous Infusions:  DAPTOmycin 700 mg (08/26/24 1525)   PRN Meds:.acetaminophen  **OR** acetaminophen , HYDROmorphone  (DILAUDID ) injection, melatonin, ondansetron  (ZOFRAN ) IV, oxyCODONE   Antimicrobials: Anti-infectives (From admission, onward)    Start     Dose/Rate Route Frequency Ordered Stop   09/03/24 1000  doxycycline  (VIBRA -TABS) tablet 100 mg        100 mg Oral Every 12 hours 08/25/24 0935 10/01/24 0959   09/03/24 0000  doxycycline  (VIBRA -TABS) 100 MG tablet        100 mg Oral 2 times daily 08/25/24 0938 10/01/24 2359   08/20/24 1500  levofloxacin (LEVAQUIN) tablet 750 mg  Status:  Discontinued        750 mg Oral Daily 08/20/24 1410 08/23/24 0916   08/20/24 1500  metroNIDAZOLE (FLAGYL) tablet 500 mg   Status:  Discontinued        500 mg Oral Every 12 hours 08/20/24 1410 08/23/24 0916   08/18/24 1600  DAPTOmycin (CUBICIN) IVPB 700 mg/146mL premix  Status:  Discontinued        6 mg/kg  108.9 kg 200 mL/hr over 30 Minutes Intravenous Daily 08/18/24 1519 08/18/24 1529   08/18/24 1600  DAPTOmycin (CUBICIN) IVPB 700 mg/122mL premix        8 mg/kg  91.5 kg (Adjusted) 200 mL/hr over 30 Minutes Intravenous Daily 08/18/24 1529 09/02/24 2359   08/18/24 0930  vancomycin  (VANCOCIN ) IVPB 1000 mg/200 mL premix  Status:  Discontinued       Placed in Followed by Linked Group   1,000 mg 200 mL/hr over 60 Minutes Intravenous Every 12 hours 08/18/24 0916 08/18/24 1519   08/18/24 0715  vancomycin  (VANCOREADY) IVPB 1250 mg/250 mL  Status:  Discontinued       Placed in Followed by Linked Group   1,250 mg 166.7 mL/hr over 90 Minutes Intravenous Every 12 hours 08/17/24 1914 08/18/24 0916   08/17/24 1915  vancomycin  (VANCOREADY) IVPB 2000 mg/400  mL       Placed in Followed by Linked Group   2,000 mg 200 mL/hr over 120 Minutes Intravenous  Once 08/17/24 1914 08/17/24 2303   08/17/24 1000  linezolid (ZYVOX) IVPB 600 mg  Status:  Discontinued        600 mg 300 mL/hr over 60 Minutes Intravenous Every 12 hours 08/17/24 0228 08/17/24 1914   08/17/24 1000  piperacillin-tazobactam (ZOSYN) IVPB 3.375 g  Status:  Discontinued        3.375 g 12.5 mL/hr over 240 Minutes Intravenous Every 8 hours 08/17/24 0303 08/20/24 1409   08/17/24 0200  piperacillin-tazobactam (ZOSYN) IVPB 3.375 g        3.375 g 100 mL/hr over 30 Minutes Intravenous  Once 08/17/24 0158 08/17/24 0216   08/17/24 0200  linezolid (ZYVOX) IVPB 600 mg        600 mg 300 mL/hr over 60 Minutes Intravenous  Once 08/17/24 0158 08/17/24 0415        I have personally reviewed the following labs and images: CBC: Recent Labs  Lab 08/20/24 0412 08/25/24 0353 08/26/24 0644  WBC 7.5 7.0 8.3  HGB 12.0* 12.6* 13.1  HCT 40.6 42.4 43.8  MCV 80.4  80.9 80.5  PLT 317 381 410*   BMP &GFR Recent Labs  Lab 08/20/24 0412 08/22/24 0845 08/24/24 0326 08/25/24 0353 08/26/24 0644  NA 135 133* 133* 132* 134*  K 4.1 4.7 4.7 4.6 4.0  CL 99 99 98 98 98  CO2 27 23 25 25 26   GLUCOSE 234* 271* 224* 276* 168*  BUN 10 16 24* 25* 23*  CREATININE 1.05 1.02 1.15 1.02 0.99  CALCIUM 8.2* 8.6* 8.5* 8.4* 8.8*  MG 1.6*  --   --  1.8 1.8  PHOS 3.7  --   --  4.4 4.6   Estimated Creatinine Clearance: 101.5 mL/min (by C-G formula based on SCr of 0.99 mg/dL). Liver & Pancreas: Recent Labs  Lab 08/25/24 0353 08/26/24 0644  ALBUMIN 1.8* 1.9*   No results for input(s): LIPASE, AMYLASE in the last 168 hours. No results for input(s): AMMONIA in the last 168 hours. Diabetic: No results for input(s): HGBA1C in the last 72 hours. Recent Labs  Lab 08/25/24 1217 08/25/24 1722 08/25/24 2104 08/26/24 0527 08/26/24 1137  GLUCAP 293* 264* 145* 154* 265*   Cardiac Enzymes: Recent Labs  Lab 08/26/24 0644  CKTOTAL 719*   No results for input(s): PROBNP in the last 8760 hours. Coagulation Profile: No results for input(s): INR, PROTIME in the last 168 hours. Thyroid Function Tests: No results for input(s): TSH, T4TOTAL, FREET4, T3FREE, THYROIDAB in the last 72 hours. Lipid Profile: No results for input(s): CHOL, HDL, LDLCALC, TRIG, CHOLHDL, LDLDIRECT in the last 72 hours. Anemia Panel: No results for input(s): VITAMINB12, FOLATE, FERRITIN, TIBC, IRON, RETICCTPCT in the last 72 hours. Urine analysis:    Component Value Date/Time   COLORURINE YELLOW 08/17/2024 0320   APPEARANCEUR CLEAR 08/17/2024 0320   LABSPEC 1.024 08/17/2024 0320   PHURINE 7.0 08/17/2024 0320   GLUCOSEU >=500 (A) 08/17/2024 0320   HGBUR SMALL (A) 08/17/2024 0320   BILIRUBINUR NEGATIVE 08/17/2024 0320   KETONESUR NEGATIVE 08/17/2024 0320   PROTEINUR >=300 (A) 08/17/2024 0320   NITRITE NEGATIVE 08/17/2024 0320   LEUKOCYTESUR  NEGATIVE 08/17/2024 0320   Sepsis Labs: Invalid input(s): PROCALCITONIN, LACTICIDVEN  Microbiology: Recent Results (from the past 240 hours)  Blood culture (routine x 2)     Status: Abnormal   Collection Time: 08/17/24  1:00 AM   Specimen: BLOOD  Result Value Ref Range Status   Specimen Description BLOOD SITE NOT SPECIFIED  Final   Special Requests   Final    BOTTLES DRAWN AEROBIC AND ANAEROBIC Blood Culture adequate volume   Culture  Setup Time   Final    GRAM POSITIVE COCCI IN BOTH AEROBIC AND ANAEROBIC BOTTLES CRITICAL VALUE NOTED.  VALUE IS CONSISTENT WITH PREVIOUSLY REPORTED AND CALLED VALUE.    Culture (A)  Final    STAPHYLOCOCCUS AUREUS SUSCEPTIBILITIES PERFORMED ON PREVIOUS CULTURE WITHIN THE LAST 5 DAYS. Performed at [facility], [address] [place], [place] [address]    Report Status 08/19/2024 FINAL  Final  Blood culture (routine x 2)     Status: Abnormal   Collection Time: 08/17/24  1:05 AM   Specimen: BLOOD  Result Value Ref Range Status   Specimen Description BLOOD SITE NOT SPECIFIED  Final   Special Requests   Final    BOTTLES DRAWN AEROBIC AND ANAEROBIC Blood Culture adequate volume   Culture  Setup Time   Final    GRAM POSITIVE COCCI IN CLUSTERS IN BOTH AEROBIC AND ANAEROBIC BOTTLES CRITICAL RESULT CALLED TO, READ BACK BY AND VERIFIED WITH: PHARMD AUSTIN PAYTES ON 08/17/24 @ 1904 BY DRT    Culture (A)  Final    METHICILLIN RESISTANT STAPHYLOCOCCUS AUREUS SEE SEPARATE REPORT Performed at [facility], [address], [place], [place] [address]    Report Status 08/26/2024 FINAL  Final   Organism ID, Bacteria METHICILLIN RESISTANT STAPHYLOCOCCUS AUREUS  Final      Susceptibility   Methicillin resistant staphylococcus aureus - MIC*    CIPROFLOXACIN >=8 RESISTANT Resistant     ERYTHROMYCIN >=8 RESISTANT Resistant     GENTAMICIN <=0.5 SENSITIVE Sensitive     OXACILLIN >=4 RESISTANT Resistant     TETRACYCLINE <=1 SENSITIVE Sensitive      VANCOMYCIN  <=0.5 SENSITIVE Sensitive     TRIMETH/SULFA 80 RESISTANT Resistant     CLINDAMYCIN <=0.25 SENSITIVE Sensitive     RIFAMPIN <=0.5 SENSITIVE Sensitive     Inducible Clindamycin NEGATIVE Sensitive     LINEZOLID 2 SENSITIVE Sensitive     * METHICILLIN RESISTANT STAPHYLOCOCCUS AUREUS  Blood Culture ID Panel (Reflexed)     Status: Abnormal   Collection Time: 08/17/24  1:05 AM  Result Value Ref Range Status   Enterococcus faecalis NOT DETECTED NOT DETECTED Final   Enterococcus Faecium NOT DETECTED NOT DETECTED Final   Listeria monocytogenes NOT DETECTED NOT DETECTED Final   Staphylococcus species DETECTED (A) NOT DETECTED Final    Comment: CRITICAL RESULT CALLED TO, READ BACK BY AND VERIFIED WITH: PHARMD AUSTIN PAYTES ON 08/17/24 @ 1904 BY DRT    Staphylococcus aureus (BCID) DETECTED (A) NOT DETECTED Final    Comment: Methicillin (oxacillin)-resistant Staphylococcus aureus (MRSA). MRSA is predictably resistant to beta-lactam antibiotics (except ceftaroline). Preferred therapy is vancomycin  unless clinically contraindicated. Patient requires contact precautions if  hospitalized. CRITICAL RESULT CALLED TO, READ BACK BY AND VERIFIED WITH: PHARMD AUSTIN PAYTES ON 08/17/24 @ 1904 BY DRT    Staphylococcus epidermidis NOT DETECTED NOT DETECTED Final   Staphylococcus lugdunensis NOT DETECTED NOT DETECTED Final   Streptococcus species NOT DETECTED NOT DETECTED Final   Streptococcus agalactiae NOT DETECTED NOT DETECTED Final   Streptococcus pneumoniae NOT DETECTED NOT DETECTED Final   Streptococcus pyogenes NOT DETECTED NOT DETECTED Final   A.calcoaceticus-baumannii NOT DETECTED NOT DETECTED Final   Bacteroides fragilis NOT DETECTED NOT DETECTED Final  Enterobacterales NOT DETECTED NOT DETECTED Final   Enterobacter cloacae complex NOT DETECTED NOT DETECTED Final   Escherichia coli NOT DETECTED NOT DETECTED Final   Klebsiella aerogenes NOT DETECTED NOT DETECTED Final   Klebsiella oxytoca  NOT DETECTED NOT DETECTED Final   Klebsiella pneumoniae NOT DETECTED NOT DETECTED Final   Proteus species NOT DETECTED NOT DETECTED Final   Salmonella species NOT DETECTED NOT DETECTED Final   Serratia marcescens NOT DETECTED NOT DETECTED Final   Haemophilus influenzae NOT DETECTED NOT DETECTED Final   Neisseria meningitidis NOT DETECTED NOT DETECTED Final   Pseudomonas aeruginosa NOT DETECTED NOT DETECTED Final   Stenotrophomonas maltophilia NOT DETECTED NOT DETECTED Final   Candida albicans NOT DETECTED NOT DETECTED Final   Candida auris NOT DETECTED NOT DETECTED Final   Candida glabrata NOT DETECTED NOT DETECTED Final   Candida krusei NOT DETECTED NOT DETECTED Final   Candida parapsilosis NOT DETECTED NOT DETECTED Final   Candida tropicalis NOT DETECTED NOT DETECTED Final   Cryptococcus neoformans/gattii NOT DETECTED NOT DETECTED Final   Meth resistant mecA/C and MREJ DETECTED (A) NOT DETECTED Final    Comment: CRITICAL RESULT CALLED TO, READ BACK BY AND VERIFIED WITH: PHARMD AUSTIN PAYTES ON 08/17/24 @ 1904 BY DRT Performed at [facility], [address], [place], [place] [address]   MIC (1 Drug)-     Status: None   Collection Time: 08/17/24  1:05 AM  Result Value Ref Range Status   Min Inhibitory Conc (1 Drug) Final report  Corrected    Comment: (NOTE) Performed At: [facility] [address] [place], [place] [id number] Jennette Shorter MD Ey:[id number] CORRECTED ON 10/14 AT 1536: PREVIOUSLY REPORTED AS Preliminary report    Source DAPTOMYCIN MIC STAPH AUREUS BLOOD CULTURE  Final    Comment: Performed at [facility], [address] [place], [place] [address]  MIC Result     Status: None   Collection Time: 08/17/24  1:05 AM  Result Value Ref Range Status   Result 1 (MIC) Staphylococcus aureus  Final    Comment: (NOTE) Identification performed by account, not confirmed by this laboratory. Testing performed by gradient elution. DAPTOMYCIN  1.0 ug/mL  SUSCEPTIBLE Performed At: [facility] [address] [place], [place] [id number] Jennette Shorter MD Ey:[id number]   Culture, blood (Routine X 2) w Reflex to ID Panel     Status: None   Collection Time: 08/19/24  9:33 AM   Specimen: BLOOD RIGHT ARM  Result Value Ref Range Status   Specimen Description BLOOD RIGHT ARM  Final   Special Requests   Final    BOTTLES DRAWN AEROBIC AND ANAEROBIC Blood Culture results may not be optimal due to an inadequate volume of blood received in culture bottles   Culture   Final    NO GROWTH 5 DAYS Performed at [facility], [address] [place], [place] [address]    Report Status 08/24/2024 FINAL  Final  Culture, blood (Routine X 2) w Reflex to ID Panel     Status: None   Collection Time: 08/19/24  9:33 AM   Specimen: BLOOD RIGHT HAND  Result Value Ref Range Status   Specimen Description BLOOD RIGHT HAND  Final   Special Requests   Final    BOTTLES DRAWN AEROBIC AND ANAEROBIC Blood Culture results may not be optimal due to an inadequate volume of blood received in culture bottles   Culture   Final    NO GROWTH 5  DAYS Performed at [facility], [address] [place], [place] [address]    Report Status 08/24/2024 FINAL  Final  MRSA Next Gen by PCR, Nasal     Status: Abnormal   Collection Time: 08/23/24  8:00 AM   Specimen: Nasal Mucosa; Nasal Swab  Result Value Ref Range Status   MRSA by PCR Next Gen DETECTED (A) NOT DETECTED Final    Comment: RESULT CALLED TO, READ BACK BY AND VERIFIED WITH: RN EMERSON HALEY [phone] @ 1539 FH (NOTE) The GeneXpert MRSA Assay (FDA approved for NASAL specimens only), is one component of a comprehensive MRSA colonization surveillance program. It is not intended to diagnose MRSA infection nor to guide or monitor treatment for MRSA infections. Test performance is not FDA approved in patients less than 16 years old. Performed at [facility], [address] [place], [place] [address]      Radiology Studies: No results found.     Lamiah Marmol T. Linc Renne [facility]  If 7PM-7AM, please contact night-coverage [email or web address] 08/26/2024, 3:37 PM

## 2024-08-27 DIAGNOSIS — L03116 Cellulitis of left lower limb: Secondary | ICD-10-CM | POA: Diagnosis not present

## 2024-08-27 DIAGNOSIS — M869 Osteomyelitis, unspecified: Secondary | ICD-10-CM | POA: Diagnosis not present

## 2024-08-27 DIAGNOSIS — M86272 Subacute osteomyelitis, left ankle and foot: Secondary | ICD-10-CM | POA: Diagnosis not present

## 2024-08-27 DIAGNOSIS — I5032 Chronic diastolic (congestive) heart failure: Secondary | ICD-10-CM | POA: Diagnosis not present

## 2024-08-27 DIAGNOSIS — E1165 Type 2 diabetes mellitus with hyperglycemia: Secondary | ICD-10-CM | POA: Diagnosis not present

## 2024-08-27 LAB — GLUCOSE, CAPILLARY
Glucose-Capillary: 244 mg/dL — ABNORMAL HIGH (ref 70–99)
Glucose-Capillary: 400 mg/dL — ABNORMAL HIGH (ref 70–99)
Glucose-Capillary: 268 mg/dL — ABNORMAL HIGH (ref 70–99)
Glucose-Capillary: 266 mg/dL — ABNORMAL HIGH (ref 70–99)

## 2024-08-27 MED ORDER — INSULIN GLARGINE-YFGN 100 UNIT/ML ~~LOC~~ SOLN
35.0000 [IU] | Freq: Two times a day (BID) | SUBCUTANEOUS | Status: DC
  Administered 2024-08-27 – 2024-08-28 (×3): 35 [IU] via SUBCUTANEOUS
  Filled 2024-08-27 (×4): qty 0.35

## 2024-08-27 MED ORDER — INSULIN ASPART 100 UNIT/ML IJ SOLN
10.0000 [IU] | Freq: Three times a day (TID) | INTRAMUSCULAR | Status: DC
  Administered 2024-08-27 – 2024-08-29 (×7): 10 [IU] via SUBCUTANEOUS

## 2024-08-27 NOTE — Plan of Care (Signed)
  Problem: Coping: Goal: Ability to adjust to condition or change in health will improve Outcome: Progressing   Problem: Skin Integrity: Goal: Risk for impaired skin integrity will decrease Outcome: Progressing   Problem: Coping: Goal: Level of anxiety will decrease Outcome: Progressing   Problem: Elimination: Goal: Will not experience complications related to bowel motility Outcome: Progressing

## 2024-08-27 NOTE — Progress Notes (Signed)
 PROGRESS NOTE  SPARROW SANZO FMW:[id number] DOB: [birth year]   PCP: [name] [name], NP  Patient is from: Home.  DOA: 08/16/2024 LOS: 10  Chief complaints Chief Complaint  Patient presents with   Hyperglycemia   Open Wound     Brief Narrative / Interim history: 58 year old M with PMH of DM-2, right BKA in 02/2023 and tobacco use disorder presenting with worsening left foot redness, pain and swelling with purulent drainage for last 2 months and admitted with diabetic foot infection, and L foot osteomyelitis.  He has been following up with [facility], and sent to ED for further evaluation.  Left foot x-ray shows left fifth metatarsal joint changes consistent with osteomyelitis.  Patient was admitted and orthopedics was consulted,  started on empiric antibiotics.   Further workup showed MRSA bacteremia.  ID consulted and antibiotic changed to IV daptomycin.  Initially refused left BKA but he later agreed.  Left BKA scheduled for Wednesday.   Subjective: Seen and examined earlier this afternoon.  No complaints other than uncomfortable mattress.  Objective: Vitals:   08/26/24 1623 08/26/24 1948 08/27/24 0821 08/27/24 1429  BP: 117/65 111/72 (!) 107/54 115/63  Pulse: 86 (!) 107 98 96  Resp: 18 15 19 18   Temp: 97.8 F (36.6 C) 98.4 F (36.9 C) 98.3 F (36.8 C) 98.3 F (36.8 C)  TempSrc: Oral Oral Oral   SpO2: 100% 98% 90% 91%  Weight:      Height:        Examination:  GENERAL: No apparent distress.  Nontoxic. HEENT: MMM.  Vision and hearing grossly intact.  NECK: Supple.  No apparent JVD.  RESP:  No IWOB.  Fair aeration bilaterally. CVS:  RRR. Heart sounds normal.  ABD/GI/GU: BS+. Abd soft, NTND.  MSK/EXT: Right BKA.  Ace wrap over left foot.  Edema/stasis dermatitis proximally SKIN: As above. NEURO: AA.  Oriented appropriately.  No apparent focal neuro deficit. PSYCH: Calm. Normal affect.   Consultants:  Orthopedic surgery Infectious  disease Cardiology  Procedures: None  Microbiology summarized: 10/8-blood culture with MRSA. 10/10-blood cultures NGTD  Assessment and plan: Left foot Osteomyelitis Diabetic foot infection: MRSA bacteremia: -Worsening L foot wound with purulent drainage, swelling and pain x 2 months.  -X-ray concerning for osteomyelitis. -MRI-osteomyelitis/septic arthritis of the underlying fifth proximal phalanx and mid to distal fifth metatarsal. -Blood cultures with MRSA.  Repeat blood culture negative.  TTE and TEE negative for vegetation. -Zyvox 10/8>> vancomycin  10/8>> daptomycin 10/9>> -Zosyn 10/8>> Levaquin and Flagyl 10/11-10/14 -ID recommends IV daptomycin until 10/24 followed by doxycycline  if no BKA -Initially refused BKA but changed his mind.  Lan for left BKA on 10/22 by Dr. Harden -Optimize glycemic control   Uncontrolled IDDM-2 with hyperglycemia and diabetic wound: A1c 13.2%. Recent Labs  Lab 08/26/24 1625 08/26/24 2121 08/27/24 0909 08/27/24 1323 08/27/24 1602  GLUCAP 245* 271* 244* 400* 268*  -Increase basal insulin  from 25 units to 35 units twice daily -Increase NovoLog  from 8 units to 10 units 3 times daily -Continue SSI-resistant scale -Further adjustment as appropriate. -Carb modified diet  Chronic COPD: Stable -Breztri for home Trelegy. - DuoNebs as needed   Chronic HFpEF: TTE with LVEF of 60 to 65%, indeterminate DD and RVSP of 58.8.  Appears euvolemic but limited exam due to body habitus.  Has LLE edema.  Unclear if he is taking Bumex  at home.  Started here.  Excellent urine output. -Continue Bumex  1 mg daily -Monitor fluid and respiratory status  Chronic pain with  chronic prescription opiate use - sees pain clinic in [place], [place]. -Continue home oxycodone  -Increase gabapentin  to home dose. -IV Dilaudid  as needed for severe pain.  S/P BKA: In the setting of PVD, tobacco use and poorly controlled diabetes. -PT/OT  Tobacco use disorder: -Encourage smoking  cessation -Nicotine patch.   Hyponatremia: Mild.  Improved.  GERD -Continue PPI.      Body mass index is 29.26 kg/m.          DVT prophylaxis:  SCDs Start: 08/17/24 0224  Code Status: Full code Family Communication: None at the bedside Level of care: Med-Surg Status is: Inpatient Remains inpatient appropriate because: MRSA bacteremia, left foot osteomyelitis/diabetic wound infection   Final disposition: To be determined after surgery.   35 minutes with more than 50% spent in reviewing records, counseling patient/family and coordinating care.   Sch Meds:  Scheduled Meds:  bumetanide   1 mg Oral Daily   Chlorhexidine  Gluconate Cloth  6 each Topical Daily   [START ON 09/03/2024] doxycycline   100 mg Oral Q12H   enoxaparin (LOVENOX) injection  50 mg Subcutaneous Daily   gabapentin   800 mg Oral TID   insulin  aspart  0-20 Units Subcutaneous TID WC   insulin  aspart  0-5 Units Subcutaneous QHS   insulin  aspart  10 Units Subcutaneous TID WC   insulin  glargine-yfgn  35 Units Subcutaneous BID   irbesartan  300 mg Oral Daily   mupirocin ointment  1 Application Nasal BID   Continuous Infusions:  DAPTOmycin 700 mg (08/27/24 1329)   PRN Meds:.acetaminophen  **OR** acetaminophen , HYDROmorphone  (DILAUDID ) injection, melatonin, ondansetron  (ZOFRAN ) IV, oxyCODONE   Antimicrobials: Anti-infectives (From admission, onward)    Start     Dose/Rate Route Frequency Ordered Stop   09/03/24 1000  doxycycline  (VIBRA -TABS) tablet 100 mg        100 mg Oral Every 12 hours 08/25/24 0935 10/01/24 0959   09/03/24 0000  doxycycline  (VIBRA -TABS) 100 MG tablet        100 mg Oral 2 times daily 08/25/24 0938 10/01/24 2359   08/20/24 1500  levofloxacin (LEVAQUIN) tablet 750 mg  Status:  Discontinued        750 mg Oral Daily 08/20/24 1410 08/23/24 0916   08/20/24 1500  metroNIDAZOLE (FLAGYL) tablet 500 mg  Status:  Discontinued        500 mg Oral Every 12 hours 08/20/24 1410 08/23/24 0916    08/18/24 1600  DAPTOmycin (CUBICIN) IVPB 700 mg/151mL premix  Status:  Discontinued        6 mg/kg  108.9 kg 200 mL/hr over 30 Minutes Intravenous Daily 08/18/24 1519 08/18/24 1529   08/18/24 1600  DAPTOmycin (CUBICIN) IVPB 700 mg/143mL premix        8 mg/kg  91.5 kg (Adjusted) 200 mL/hr over 30 Minutes Intravenous Daily 08/18/24 1529 09/02/24 2359   08/18/24 0930  vancomycin  (VANCOCIN ) IVPB 1000 mg/200 mL premix  Status:  Discontinued       Placed in Followed by Linked Group   1,000 mg 200 mL/hr over 60 Minutes Intravenous Every 12 hours 08/18/24 0916 08/18/24 1519   08/18/24 0715  vancomycin  (VANCOREADY) IVPB 1250 mg/250 mL  Status:  Discontinued       Placed in Followed by Linked Group   1,250 mg 166.7 mL/hr over 90 Minutes Intravenous Every 12 hours 08/17/24 1914 08/18/24 0916   08/17/24 1915  vancomycin  (VANCOREADY) IVPB 2000 mg/400 mL       Placed in Followed by Linked Group   2,000 mg 200 mL/hr  over 120 Minutes Intravenous  Once 08/17/24 1914 08/17/24 2303   08/17/24 1000  linezolid (ZYVOX) IVPB 600 mg  Status:  Discontinued        600 mg 300 mL/hr over 60 Minutes Intravenous Every 12 hours 08/17/24 0228 08/17/24 1914   08/17/24 1000  piperacillin-tazobactam (ZOSYN) IVPB 3.375 g  Status:  Discontinued        3.375 g 12.5 mL/hr over 240 Minutes Intravenous Every 8 hours 08/17/24 0303 08/20/24 1409   08/17/24 0200  piperacillin-tazobactam (ZOSYN) IVPB 3.375 g        3.375 g 100 mL/hr over 30 Minutes Intravenous  Once 08/17/24 0158 08/17/24 0216   08/17/24 0200  linezolid (ZYVOX) IVPB 600 mg        600 mg 300 mL/hr over 60 Minutes Intravenous  Once 08/17/24 0158 08/17/24 0415        I have personally reviewed the following labs and images: CBC: Recent Labs  Lab 08/25/24 0353 08/26/24 0644  WBC 7.0 8.3  HGB 12.6* 13.1  HCT 42.4 43.8  MCV 80.9 80.5  PLT 381 410*   BMP &GFR Recent Labs  Lab 08/22/24 0845 08/24/24 0326 08/25/24 0353 08/26/24 0644  NA 133*  133* 132* 134*  K 4.7 4.7 4.6 4.0  CL 99 98 98 98  CO2 23 25 25 26   GLUCOSE 271* 224* 276* 168*  BUN 16 24* 25* 23*  CREATININE 1.02 1.15 1.02 0.99  CALCIUM 8.6* 8.5* 8.4* 8.8*  MG  --   --  1.8 1.8  PHOS  --   --  4.4 4.6   Estimated Creatinine Clearance: 101.5 mL/min (by C-G formula based on SCr of 0.99 mg/dL). Liver & Pancreas: Recent Labs  Lab 08/25/24 0353 08/26/24 0644  ALBUMIN 1.8* 1.9*   No results for input(s): LIPASE, AMYLASE in the last 168 hours. No results for input(s): AMMONIA in the last 168 hours. Diabetic: No results for input(s): HGBA1C in the last 72 hours. Recent Labs  Lab 08/26/24 1625 08/26/24 2121 08/27/24 0909 08/27/24 1323 08/27/24 1602  GLUCAP 245* 271* 244* 400* 268*   Cardiac Enzymes: Recent Labs  Lab 08/26/24 0644  CKTOTAL 719*   No results for input(s): PROBNP in the last 8760 hours. Coagulation Profile: No results for input(s): INR, PROTIME in the last 168 hours. Thyroid Function Tests: No results for input(s): TSH, T4TOTAL, FREET4, T3FREE, THYROIDAB in the last 72 hours. Lipid Profile: No results for input(s): CHOL, HDL, LDLCALC, TRIG, CHOLHDL, LDLDIRECT in the last 72 hours. Anemia Panel: No results for input(s): VITAMINB12, FOLATE, FERRITIN, TIBC, IRON, RETICCTPCT in the last 72 hours. Urine analysis:    Component Value Date/Time   COLORURINE YELLOW 08/17/2024 0320   APPEARANCEUR CLEAR 08/17/2024 0320   LABSPEC 1.024 08/17/2024 0320   PHURINE 7.0 08/17/2024 0320   GLUCOSEU >=500 (A) 08/17/2024 0320   HGBUR SMALL (A) 08/17/2024 0320   BILIRUBINUR NEGATIVE 08/17/2024 0320   KETONESUR NEGATIVE 08/17/2024 0320   PROTEINUR >=300 (A) 08/17/2024 0320   NITRITE NEGATIVE 08/17/2024 0320   LEUKOCYTESUR NEGATIVE 08/17/2024 0320   Sepsis Labs: Invalid input(s): PROCALCITONIN, LACTICIDVEN  Microbiology: Recent Results (from the past 240 hours)  Culture, blood (Routine X 2) w  Reflex to ID Panel     Status: None   Collection Time: 08/19/24  9:33 AM   Specimen: BLOOD RIGHT ARM  Result Value Ref Range Status   Specimen Description BLOOD RIGHT ARM  Final   Special Requests   Final  BOTTLES DRAWN AEROBIC AND ANAEROBIC Blood Culture results may not be optimal due to an inadequate volume of blood received in culture bottles   Culture   Final    NO GROWTH 5 DAYS Performed at [facility], [address] [place], [place] [address]    Report Status 08/24/2024 FINAL  Final  Culture, blood (Routine X 2) w Reflex to ID Panel     Status: None   Collection Time: 08/19/24  9:33 AM   Specimen: BLOOD RIGHT HAND  Result Value Ref Range Status   Specimen Description BLOOD RIGHT HAND  Final   Special Requests   Final    BOTTLES DRAWN AEROBIC AND ANAEROBIC Blood Culture results may not be optimal due to an inadequate volume of blood received in culture bottles   Culture   Final    NO GROWTH 5 DAYS Performed at [facility], [address] [place], [place] [address]    Report Status 08/24/2024 FINAL  Final  MRSA Next Gen by PCR, Nasal     Status: Abnormal   Collection Time: 08/23/24  8:00 AM   Specimen: Nasal Mucosa; Nasal Swab  Result Value Ref Range Status   MRSA by PCR Next Gen DETECTED (A) NOT DETECTED Final    Comment: RESULT CALLED TO, READ BACK BY AND VERIFIED WITH: RN EMERSON HALEY [phone] @ 1539 FH (NOTE) The GeneXpert MRSA Assay (FDA approved for NASAL specimens only), is one component of a comprehensive MRSA colonization surveillance program. It is not intended to diagnose MRSA infection nor to guide or monitor treatment for MRSA infections. Test performance is not FDA approved in patients less than 54 years old. Performed at [facility], [address], [place], [place] [address]     Radiology Studies: No results found.   Chevon Laufer T. Kerstie Agent [facility]  If 7PM-7AM, please contact night-coverage [email or web address] 08/27/2024, 4:50 PM

## 2024-08-27 NOTE — Progress Notes (Signed)
 Pt refused.

## 2024-08-27 NOTE — H&P (View-Only) (Signed)
 Patient ID: Ryan [name], male   DOB: [birth year], 59 y.o.   MRN: [id number] Patient agrees to proceed with a left below-knee amputation on Wednesday.  Anticipate patient will need discharge to a skilled nursing facility postoperatively.  Patient will also need coordination with a new pain clinic.

## 2024-08-27 NOTE — Progress Notes (Signed)
 Patient ID: Ryan [name], male   DOB: [birth year], 59 y.o.   MRN: [id number] Patient agrees to proceed with a left below-knee amputation on Wednesday.  Anticipate patient will need discharge to a skilled nursing facility postoperatively.  Patient will also need coordination with a new pain clinic.

## 2024-08-28 DIAGNOSIS — I5032 Chronic diastolic (congestive) heart failure: Secondary | ICD-10-CM | POA: Diagnosis not present

## 2024-08-28 DIAGNOSIS — E1165 Type 2 diabetes mellitus with hyperglycemia: Secondary | ICD-10-CM | POA: Diagnosis not present

## 2024-08-28 DIAGNOSIS — M869 Osteomyelitis, unspecified: Secondary | ICD-10-CM | POA: Diagnosis not present

## 2024-08-28 DIAGNOSIS — L03116 Cellulitis of left lower limb: Secondary | ICD-10-CM | POA: Diagnosis not present

## 2024-08-28 LAB — RENAL FUNCTION PANEL
Albumin: 2 g/dL — ABNORMAL LOW (ref 3.5–5.0)
Anion gap: 10 (ref 5–15)
BUN: 30 mg/dL — ABNORMAL HIGH (ref 6–20)
CO2: 27 mmol/L (ref 22–32)
Calcium: 8.7 mg/dL — ABNORMAL LOW (ref 8.9–10.3)
Chloride: 97 mmol/L — ABNORMAL LOW (ref 98–111)
Creatinine, Ser: 1.07 mg/dL (ref 0.61–1.24)
GFR, Estimated: >60 mL/min (ref >60)
Glucose, Bld: 228 mg/dL — ABNORMAL HIGH (ref 70–99)
Phosphorus: 4.5 mg/dL (ref 2.5–4.6)
Potassium: 4.7 mmol/L (ref 3.5–5.1)
Sodium: 134 mmol/L — ABNORMAL LOW (ref 135–145)

## 2024-08-28 LAB — CBC
HCT: 41.9 % (ref 39.0–52.0)
Hemoglobin: 12.4 g/dL — ABNORMAL LOW (ref 13.0–17.0)
MCH: 24.4 pg — ABNORMAL LOW (ref 26.0–34.0)
MCHC: 29.6 g/dL — ABNORMAL LOW (ref 30.0–36.0)
MCV: 82.5 fL (ref 80.0–100.0)
Platelets: 381 K/uL (ref 150–400)
RBC: 5.08 MIL/uL (ref 4.22–5.81)
RDW: 19.7 % — ABNORMAL HIGH (ref 11.5–15.5)
WBC: 9.3 K/uL (ref 4.0–10.5)
nRBC: 0 % (ref 0.0–0.2)

## 2024-08-28 LAB — GLUCOSE, CAPILLARY
Glucose-Capillary: 246 mg/dL — ABNORMAL HIGH (ref 70–99)
Glucose-Capillary: 301 mg/dL — ABNORMAL HIGH (ref 70–99)
Glucose-Capillary: 282 mg/dL — ABNORMAL HIGH (ref 70–99)
Glucose-Capillary: 279 mg/dL — ABNORMAL HIGH (ref 70–99)

## 2024-08-28 LAB — MAGNESIUM: Magnesium: 1.8 mg/dL (ref 1.7–2.4)

## 2024-08-28 MED ORDER — INSULIN GLARGINE-YFGN 100 UNIT/ML ~~LOC~~ SOLN
45.0000 [IU] | Freq: Two times a day (BID) | SUBCUTANEOUS | Status: DC
  Administered 2024-08-28 – 2024-09-10 (×26): 45 [IU] via SUBCUTANEOUS
  Filled 2024-08-28 (×30): qty 0.45

## 2024-08-28 NOTE — Progress Notes (Signed)
 PROGRESS NOTE  Ryan [name] FMW:[id number] DOB: [birth year]   PCP: Ryan Lamar [name], NP  Patient is from: Home.  DOA: 08/16/2024 LOS: 11  Chief complaints Chief Complaint  Patient presents with   Hyperglycemia   Open Wound     Brief Narrative / Interim history: 58 year old M with PMH of DM-2, right BKA in 02/2023 and tobacco use disorder presenting with worsening left foot redness, pain and swelling with purulent drainage for last 2 months and admitted with diabetic foot infection, and L foot osteomyelitis.  He has been following up with [facility], and sent to ED for further evaluation.  Left foot x-ray shows left fifth metatarsal joint changes consistent with osteomyelitis.  Patient was admitted and orthopedics was consulted,  started on empiric antibiotics.   Further workup showed MRSA bacteremia.  ID consulted and antibiotic changed to IV daptomycin.  Initially refused left BKA but he later agreed.  Left BKA scheduled for Wednesday.   Subjective: Seen and examined earlier this afternoon.  No major events overnight or this morning.  No complaints.  Objective: Vitals:   08/27/24 2040 08/28/24 0521 08/28/24 0751 08/28/24 1549  BP: 127/62 133/82 126/89 (!) 143/74  Pulse: (!) 101 (!) 105 99 96  Resp: 15 16 18 19   Temp: 98.8 F (37.1 C) 97.7 F (36.5 C) 98 F (36.7 C) 97.8 F (36.6 C)  TempSrc: Oral Oral  Oral  SpO2: 96% 95% 94% 100%  Weight:      Height:        Examination:  GENERAL: No apparent distress.  Nontoxic. HEENT: MMM.  Vision and hearing grossly intact.  NECK: Supple.  No apparent JVD.  RESP:  No IWOB.  Fair aeration bilaterally. CVS:  RRR. Heart sounds normal.  ABD/GI/GU: BS+. Abd soft, NTND.  MSK/EXT: Right BKA.  Ace wrap over left foot.  Edema/stasis dermatitis proximally SKIN: As above. NEURO: AA.  Oriented appropriately.  No apparent focal neuro deficit. PSYCH: Calm. Normal affect.   Consultants:  Orthopedic surgery Infectious  disease Cardiology  Procedures: None  Microbiology summarized: 10/8-blood culture with MRSA. 10/10-blood cultures NGTD  Assessment and plan: Left foot Osteomyelitis Diabetic foot infection: MRSA bacteremia: -Worsening L foot wound with purulent drainage, swelling and pain x 2 months.  -X-ray concerning for osteomyelitis. -MRI-osteomyelitis/septic arthritis of the underlying fifth proximal phalanx and mid to distal fifth metatarsal. -Blood cultures with MRSA.  Repeat blood culture negative.  TTE and TEE negative for vegetation. -Zyvox 10/8>> vancomycin  10/8>> daptomycin 10/9>> -Zosyn 10/8>> Levaquin and Flagyl 10/11-10/14 -ID recommends IV daptomycin until 10/24 followed by doxycycline  if no BKA -Initially refused BKA but changed his mind.  Lan for left BKA on 10/22 by Dr. Harden -Optimize glycemic control   Uncontrolled IDDM-2 with hyperglycemia and diabetic wound: A1c 13.2%. Recent Labs  Lab 08/27/24 1602 08/27/24 2205 08/28/24 0707 08/28/24 1159 08/28/24 1624  GLUCAP 268* 266* 246* 301* 282*  -Increase basal insulin  from 35 units to 45 units twice daily -Continue NovoLog  10 units 3 times daily with meals -Continue SSI-resistant scale -Further adjustment as appropriate. -Carb modified diet  Chronic COPD: Stable -Breztri for home Trelegy. -DuoNebs as needed   Chronic HFpEF: TTE with LVEF of 60 to 65%, indeterminate DD and RVSP of 58.8.  Appears euvolemic but limited exam due to body habitus.  Has LLE edema.  Unclear if he is taking Bumex  at home.  Started here.  Excellent urine output. -Continue Bumex  1 mg daily -Monitor fluid, respiratory status, renal functions and  electrolytes  Chronic pain with chronic prescription opiate use - sees pain clinic in [place], [place]. -Continue home oxycodone  -Increase gabapentin  to home dose. -IV Dilaudid  as needed for severe pain.  S/P BKA: In the setting of PVD, tobacco use and poorly controlled diabetes. -PT/OT  Tobacco use  disorder: -Encourage smoking cessation -Nicotine patch.   Hyponatremia: Mild.  Improved.  GERD -Continue PPI.      Body mass index is 29.26 kg/m.          DVT prophylaxis:  SCDs Start: 08/17/24 0224  Code Status: Full code Family Communication: None at the bedside Level of care: Med-Surg Status is: Inpatient Remains inpatient appropriate because: MRSA bacteremia, left foot osteomyelitis/diabetic wound infection   Final disposition: To be determined after surgery.   35 minutes with more than 50% spent in reviewing records, counseling patient/family and coordinating care.   Sch Meds:  Scheduled Meds:  bumetanide   1 mg Oral Daily   [START ON 09/03/2024] doxycycline   100 mg Oral Q12H   enoxaparin (LOVENOX) injection  50 mg Subcutaneous Daily   gabapentin   800 mg Oral TID   insulin  aspart  0-20 Units Subcutaneous TID WC   insulin  aspart  0-5 Units Subcutaneous QHS   insulin  aspart  10 Units Subcutaneous TID WC   insulin  glargine-yfgn  45 Units Subcutaneous BID   irbesartan  300 mg Oral Daily   Continuous Infusions:  DAPTOmycin 700 mg (08/28/24 1352)   PRN Meds:.acetaminophen  **OR** acetaminophen , HYDROmorphone  (DILAUDID ) injection, melatonin, ondansetron  (ZOFRAN ) IV, oxyCODONE   Antimicrobials: Anti-infectives (From admission, onward)    Start     Dose/Rate Route Frequency Ordered Stop   09/03/24 1000  doxycycline  (VIBRA -TABS) tablet 100 mg        100 mg Oral Every 12 hours 08/25/24 0935 10/01/24 0959   09/03/24 0000  doxycycline  (VIBRA -TABS) 100 MG tablet        100 mg Oral 2 times daily 08/25/24 0938 10/01/24 2359   08/20/24 1500  levofloxacin (LEVAQUIN) tablet 750 mg  Status:  Discontinued        750 mg Oral Daily 08/20/24 1410 08/23/24 0916   08/20/24 1500  metroNIDAZOLE (FLAGYL) tablet 500 mg  Status:  Discontinued        500 mg Oral Every 12 hours 08/20/24 1410 08/23/24 0916   08/18/24 1600  DAPTOmycin (CUBICIN) IVPB 700 mg/117mL premix  Status:   Discontinued        6 mg/kg  108.9 kg 200 mL/hr over 30 Minutes Intravenous Daily 08/18/24 1519 08/18/24 1529   08/18/24 1600  DAPTOmycin (CUBICIN) IVPB 700 mg/181mL premix        8 mg/kg  91.5 kg (Adjusted) 200 mL/hr over 30 Minutes Intravenous Daily 08/18/24 1529 09/02/24 2359   08/18/24 0930  vancomycin  (VANCOCIN ) IVPB 1000 mg/200 mL premix  Status:  Discontinued       Placed in Followed by Linked Group   1,000 mg 200 mL/hr over 60 Minutes Intravenous Every 12 hours 08/18/24 0916 08/18/24 1519   08/18/24 0715  vancomycin  (VANCOREADY) IVPB 1250 mg/250 mL  Status:  Discontinued       Placed in Followed by Linked Group   1,250 mg 166.7 mL/hr over 90 Minutes Intravenous Every 12 hours 08/17/24 1914 08/18/24 0916   08/17/24 1915  vancomycin  (VANCOREADY) IVPB 2000 mg/400 mL       Placed in Followed by Linked Group   2,000 mg 200 mL/hr over 120 Minutes Intravenous  Once 08/17/24 1914 08/17/24 2303   08/17/24 1000  linezolid (ZYVOX) IVPB 600 mg  Status:  Discontinued        600 mg 300 mL/hr over 60 Minutes Intravenous Every 12 hours 08/17/24 0228 08/17/24 1914   08/17/24 1000  piperacillin-tazobactam (ZOSYN) IVPB 3.375 g  Status:  Discontinued        3.375 g 12.5 mL/hr over 240 Minutes Intravenous Every 8 hours 08/17/24 0303 08/20/24 1409   08/17/24 0200  piperacillin-tazobactam (ZOSYN) IVPB 3.375 g        3.375 g 100 mL/hr over 30 Minutes Intravenous  Once 08/17/24 0158 08/17/24 0216   08/17/24 0200  linezolid (ZYVOX) IVPB 600 mg        600 mg 300 mL/hr over 60 Minutes Intravenous  Once 08/17/24 0158 08/17/24 0415        I have personally reviewed the following labs and images: CBC: Recent Labs  Lab 08/25/24 0353 08/26/24 0644 08/28/24 0729  WBC 7.0 8.3 9.3  HGB 12.6* 13.1 12.4*  HCT 42.4 43.8 41.9  MCV 80.9 80.5 82.5  PLT 381 410* 381   BMP &GFR Recent Labs  Lab 08/22/24 0845 08/24/24 0326 08/25/24 0353 08/26/24 0644 08/28/24 0729  NA 133* 133* 132* 134*  134*  K 4.7 4.7 4.6 4.0 4.7  CL 99 98 98 98 97*  CO2 23 25 25 26 27   GLUCOSE 271* 224* 276* 168* 228*  BUN 16 24* 25* 23* 30*  CREATININE 1.02 1.15 1.02 0.99 1.07  CALCIUM 8.6* 8.5* 8.4* 8.8* 8.7*  MG  --   --  1.8 1.8 1.8  PHOS  --   --  4.4 4.6 4.5   Estimated Creatinine Clearance: 93.9 mL/min (by C-G formula based on SCr of 1.07 mg/dL). Liver & Pancreas: Recent Labs  Lab 08/25/24 0353 08/26/24 0644 08/28/24 0729  ALBUMIN 1.8* 1.9* 2.0*   No results for input(s): LIPASE, AMYLASE in the last 168 hours. No results for input(s): AMMONIA in the last 168 hours. Diabetic: No results for input(s): HGBA1C in the last 72 hours. Recent Labs  Lab 08/27/24 1602 08/27/24 2205 08/28/24 0707 08/28/24 1159 08/28/24 1624  GLUCAP 268* 266* 246* 301* 282*   Cardiac Enzymes: Recent Labs  Lab 08/26/24 0644  CKTOTAL 719*   No results for input(s): PROBNP in the last 8760 hours. Coagulation Profile: No results for input(s): INR, PROTIME in the last 168 hours. Thyroid Function Tests: No results for input(s): TSH, T4TOTAL, FREET4, T3FREE, THYROIDAB in the last 72 hours. Lipid Profile: No results for input(s): CHOL, HDL, LDLCALC, TRIG, CHOLHDL, LDLDIRECT in the last 72 hours. Anemia Panel: No results for input(s): VITAMINB12, FOLATE, FERRITIN, TIBC, IRON, RETICCTPCT in the last 72 hours. Urine analysis:    Component Value Date/Time   COLORURINE YELLOW 08/17/2024 0320   APPEARANCEUR CLEAR 08/17/2024 0320   LABSPEC 1.024 08/17/2024 0320   PHURINE 7.0 08/17/2024 0320   GLUCOSEU >=500 (A) 08/17/2024 0320   HGBUR SMALL (A) 08/17/2024 0320   BILIRUBINUR NEGATIVE 08/17/2024 0320   KETONESUR NEGATIVE 08/17/2024 0320   PROTEINUR >=300 (A) 08/17/2024 0320   NITRITE NEGATIVE 08/17/2024 0320   LEUKOCYTESUR NEGATIVE 08/17/2024 0320   Sepsis Labs: Invalid input(s): PROCALCITONIN, LACTICIDVEN  Microbiology: Recent Results (from the past  240 hours)  Culture, blood (Routine X 2) w Reflex to ID Panel     Status: None   Collection Time: 08/19/24  9:33 AM   Specimen: BLOOD RIGHT ARM  Result Value Ref Range Status   Specimen Description BLOOD RIGHT ARM  Final   Special  Requests   Final    BOTTLES DRAWN AEROBIC AND ANAEROBIC Blood Culture results may not be optimal due to an inadequate volume of blood received in culture bottles   Culture   Final    NO GROWTH 5 DAYS Performed at [facility], [address], [place], [place] [address]    Report Status 08/24/2024 FINAL  Final  Culture, blood (Routine X 2) w Reflex to ID Panel     Status: None   Collection Time: 08/19/24  9:33 AM   Specimen: BLOOD RIGHT HAND  Result Value Ref Range Status   Specimen Description BLOOD RIGHT HAND  Final   Special Requests   Final    BOTTLES DRAWN AEROBIC AND ANAEROBIC Blood Culture results may not be optimal due to an inadequate volume of blood received in culture bottles   Culture   Final    NO GROWTH 5 DAYS Performed at [facility], [address] [place], [place] [address]    Report Status 08/24/2024 FINAL  Final  MRSA Next Gen by PCR, Nasal     Status: Abnormal   Collection Time: 08/23/24  8:00 AM   Specimen: Nasal Mucosa; Nasal Swab  Result Value Ref Range Status   MRSA by PCR Next Gen DETECTED (A) NOT DETECTED Final    Comment: RESULT CALLED TO, READ BACK BY AND VERIFIED WITH: RN EMERSON HALEY [phone] @ 1539 FH (NOTE) The GeneXpert MRSA Assay (FDA approved for NASAL specimens only), is one component of a comprehensive MRSA colonization surveillance program. It is not intended to diagnose MRSA infection nor to guide or monitor treatment for MRSA infections. Test performance is not FDA approved in patients less than 37 years old. Performed at [facility], [address], [place], [place] [address]     Radiology Studies: No results found.   Kacey Vicuna T. Jayleana Colberg [facility]  If 7PM-7AM, please contact  night-coverage [email or web address] 08/28/2024, 5:04 PM

## 2024-08-28 NOTE — Plan of Care (Signed)

## 2024-08-28 NOTE — Consult Note (Signed)
 WOC consulted 11 days in to admission for wound care for the LLE. Has been seen and followed by [facility]VS and orthopedics.  LLE lyphadema with necrotic foot wounds, no options for revascularization at this time  Orders updated For LBKA on Wednesday this week.   Re consult if needed, will not follow at this time. Thanks  Jahan Friedlander [employer], RN,CWOCN, CNS, [employer] [phone]

## 2024-08-29 ENCOUNTER — Other Ambulatory Visit (HOSPITAL_COMMUNITY): Payer: Self-pay

## 2024-08-29 DIAGNOSIS — E1165 Type 2 diabetes mellitus with hyperglycemia: Secondary | ICD-10-CM | POA: Diagnosis not present

## 2024-08-29 DIAGNOSIS — L03116 Cellulitis of left lower limb: Secondary | ICD-10-CM | POA: Diagnosis not present

## 2024-08-29 DIAGNOSIS — I5032 Chronic diastolic (congestive) heart failure: Secondary | ICD-10-CM | POA: Diagnosis not present

## 2024-08-29 DIAGNOSIS — M869 Osteomyelitis, unspecified: Secondary | ICD-10-CM | POA: Diagnosis not present

## 2024-08-29 LAB — GLUCOSE, CAPILLARY
Glucose-Capillary: 160 mg/dL — ABNORMAL HIGH (ref 70–99)
Glucose-Capillary: 260 mg/dL — ABNORMAL HIGH (ref 70–99)
Glucose-Capillary: 264 mg/dL — ABNORMAL HIGH (ref 70–99)
Glucose-Capillary: 275 mg/dL — ABNORMAL HIGH (ref 70–99)

## 2024-08-29 LAB — CK: Total CK: 978 U/L — ABNORMAL HIGH (ref 49–397)

## 2024-08-29 MED ORDER — INSULIN ASPART 100 UNIT/ML IJ SOLN
12.0000 [IU] | Freq: Three times a day (TID) | INTRAMUSCULAR | Status: DC
  Administered 2024-08-29 – 2024-08-30 (×2): 12 [IU] via SUBCUTANEOUS

## 2024-08-29 NOTE — Plan of Care (Signed)

## 2024-08-29 NOTE — Progress Notes (Signed)
 PROGRESS NOTE  Ryan [name] FMW:[id number] DOB: 06/08/1966   PCP: [name] [name], NP  Patient is from: Home.  DOA: 08/16/2024 LOS: 12  Chief complaints Chief Complaint  Patient presents with   Hyperglycemia   Open Wound     Brief Narrative / Interim history: 58 year old M with PMH of DM-2, right BKA in 02/2023 and tobacco use disorder presenting with worsening left foot redness, pain and swelling with purulent drainage for last 2 months and admitted with diabetic foot infection, and L foot osteomyelitis.  He has been following up with [facility], and sent to ED for further evaluation.  Left foot x-ray shows left fifth metatarsal joint changes consistent with osteomyelitis.  Patient was admitted and orthopedics was consulted,  started on empiric antibiotics.   Further workup showed MRSA bacteremia.  ID consulted and antibiotic changed to IV daptomycin.  Initially refused left BKA but he later agreed.  Left BKA scheduled for Wednesday.   Subjective: Seen and examined earlier this afternoon.  No major events overnight or this morning.  No complaints.  Requesting regular diet but understands that needs to stay on current modified diet with significant hyperglycemia and wound infection  Objective: Vitals:   08/29/24 0019 08/29/24 0449 08/29/24 0724 08/29/24 1417  BP: 126/80 102/70 120/85 118/72  Pulse: 92 92 90 (!) 103  Resp: 15 17  17   Temp: 98 F (36.7 C) 98.4 F (36.9 C) 97.9 F (36.6 C) 98.3 F (36.8 C)  TempSrc: Oral   Oral  SpO2: 98% 96% 94% 91%  Weight:      Height:        Examination:  GENERAL: No apparent distress.  Nontoxic. HEENT: MMM.  Vision and hearing grossly intact.  NECK: Supple.  No apparent JVD.  RESP:  No IWOB.  Fair aeration bilaterally. CVS:  RRR. Heart sounds normal.  ABD/GI/GU: BS+. Abd soft, NTND.  MSK/EXT: Right BKA.  Ace wrap over left foot.  Edema/stasis dermatitis proximally SKIN: As above. NEURO: AA.  Oriented appropriately.   No apparent focal neuro deficit. PSYCH: Calm. Normal affect.   Consultants:  Orthopedic surgery Infectious disease Cardiology  Procedures: None  Microbiology summarized: 10/8-blood culture with MRSA. 10/10-blood cultures NGTD  Assessment and plan: Left foot Osteomyelitis Diabetic foot infection: MRSA bacteremia: -Worsening L foot wound with purulent drainage, swelling and pain x 2 months.  -X-ray concerning for osteomyelitis. -MRI-osteomyelitis/septic arthritis of the underlying fifth proximal phalanx and mid to distal fifth metatarsal. -Blood cultures with MRSA.  Repeat blood culture negative.  TTE and TEE negative for vegetation. -Zyvox 10/8>> vancomycin  10/8>> daptomycin 10/9>> -Zosyn 10/8>> Levaquin and Flagyl 10/11-10/14 -ID recommends IV daptomycin until 10/24 followed by doxycycline  if no BKA -Initially refused BKA but changed his mind.  Lan for left BKA on 10/22 by Dr. Harden -Optimize glycemic control   Uncontrolled IDDM-2 with hyperglycemia and diabetic wound: A1c 13.2%. Recent Labs  Lab 08/28/24 1159 08/28/24 1624 08/28/24 2118 08/29/24 0633 08/29/24 1202  GLUCAP 301* 282* 279* 160* 260*  -Continue basal insulin  45 units twice daily -Crease NovoLog  from 10 to 12 units 3 times daily with meals -Continue SSI-resistant scale -Further adjustment as appropriate. -Carb modified diet - Consult diabetic coordinator  Chronic COPD: Stable -Breztri for home Trelegy. -DuoNebs as needed   Chronic HFpEF: TTE with LVEF of 60 to 65%, indeterminate DD and RVSP of 58.8.  Appears euvolemic but limited exam due to body habitus.  Has LLE edema.  Unclear if he is taking Bumex   at home.  Started here.  Excellent urine output. -Continue Bumex  1 mg daily -Monitor fluid, respiratory status, renal functions and electrolytes  Chronic pain with chronic prescription opiate use - sees pain clinic in [place], [place]. -Continue home oxycodone  -Increase gabapentin  to home dose. -IV  Dilaudid  as needed for severe pain.  S/P BKA: In the setting of PVD, tobacco use and poorly controlled diabetes. -PT/OT  Tobacco use disorder: -Encourage smoking cessation -Nicotine patch.   Hyponatremia: Mild.  Improved.  GERD -Continue PPI.      Body mass index is 29.26 kg/m.          DVT prophylaxis:  SCDs Start: 08/17/24 0224  Code Status: Full code Family Communication: None at the bedside Level of care: Med-Surg Status is: Inpatient Remains inpatient appropriate because: MRSA bacteremia, left foot osteomyelitis/diabetic wound infection   Final disposition: To be determined after surgery.   35 minutes with more than 50% spent in reviewing records, counseling patient/family and coordinating care.   Sch Meds:  Scheduled Meds:  bumetanide   1 mg Oral Daily   [START ON 09/03/2024] doxycycline   100 mg Oral Q12H   enoxaparin (LOVENOX) injection  50 mg Subcutaneous Daily   gabapentin   800 mg Oral TID   insulin  aspart  0-20 Units Subcutaneous TID WC   insulin  aspart  0-5 Units Subcutaneous QHS   insulin  aspart  10 Units Subcutaneous TID WC   insulin  glargine-yfgn  45 Units Subcutaneous BID   irbesartan  300 mg Oral Daily   Continuous Infusions:  DAPTOmycin 700 mg (08/29/24 1440)   PRN Meds:.acetaminophen  **OR** acetaminophen , HYDROmorphone  (DILAUDID ) injection, melatonin, ondansetron  (ZOFRAN ) IV, oxyCODONE   Antimicrobials: Anti-infectives (From admission, onward)    Start     Dose/Rate Route Frequency Ordered Stop   09/03/24 1000  doxycycline  (VIBRA -TABS) tablet 100 mg        100 mg Oral Every 12 hours 08/25/24 0935 10/01/24 0959   09/03/24 0000  doxycycline  (VIBRA -TABS) 100 MG tablet        100 mg Oral 2 times daily 08/25/24 0938 10/01/24 2359   08/20/24 1500  levofloxacin (LEVAQUIN) tablet 750 mg  Status:  Discontinued        750 mg Oral Daily 08/20/24 1410 08/23/24 0916   08/20/24 1500  metroNIDAZOLE (FLAGYL) tablet 500 mg  Status:  Discontinued         500 mg Oral Every 12 hours 08/20/24 1410 08/23/24 0916   08/18/24 1600  DAPTOmycin (CUBICIN) IVPB 700 mg/170mL premix  Status:  Discontinued        6 mg/kg  108.9 kg 200 mL/hr over 30 Minutes Intravenous Daily 08/18/24 1519 08/18/24 1529   08/18/24 1600  DAPTOmycin (CUBICIN) IVPB 700 mg/173mL premix        8 mg/kg  91.5 kg (Adjusted) 200 mL/hr over 30 Minutes Intravenous Daily 08/18/24 1529 09/02/24 2359   08/18/24 0930  vancomycin  (VANCOCIN ) IVPB 1000 mg/200 mL premix  Status:  Discontinued       Placed in Followed by Linked Group   1,000 mg 200 mL/hr over 60 Minutes Intravenous Every 12 hours 08/18/24 0916 08/18/24 1519   08/18/24 0715  vancomycin  (VANCOREADY) IVPB 1250 mg/250 mL  Status:  Discontinued       Placed in Followed by Linked Group   1,250 mg 166.7 mL/hr over 90 Minutes Intravenous Every 12 hours 08/17/24 1914 08/18/24 0916   08/17/24 1915  vancomycin  (VANCOREADY) IVPB 2000 mg/400 mL       Placed in Followed by Linked  Group   2,000 mg 200 mL/hr over 120 Minutes Intravenous  Once 08/17/24 1914 08/17/24 2303   08/17/24 1000  linezolid (ZYVOX) IVPB 600 mg  Status:  Discontinued        600 mg 300 mL/hr over 60 Minutes Intravenous Every 12 hours 08/17/24 0228 08/17/24 1914   08/17/24 1000  piperacillin-tazobactam (ZOSYN) IVPB 3.375 g  Status:  Discontinued        3.375 g 12.5 mL/hr over 240 Minutes Intravenous Every 8 hours 08/17/24 0303 08/20/24 1409   08/17/24 0200  piperacillin-tazobactam (ZOSYN) IVPB 3.375 g        3.375 g 100 mL/hr over 30 Minutes Intravenous  Once 08/17/24 0158 08/17/24 0216   08/17/24 0200  linezolid (ZYVOX) IVPB 600 mg        600 mg 300 mL/hr over 60 Minutes Intravenous  Once 08/17/24 0158 08/17/24 0415        I have personally reviewed the following labs and images: CBC: Recent Labs  Lab 08/25/24 0353 08/26/24 0644 08/28/24 0729  WBC 7.0 8.3 9.3  HGB 12.6* 13.1 12.4*  HCT 42.4 43.8 41.9  MCV 80.9 80.5 82.5  PLT 381 410* 381    BMP &GFR Recent Labs  Lab 08/24/24 0326 08/25/24 0353 08/26/24 0644 08/28/24 0729  NA 133* 132* 134* 134*  K 4.7 4.6 4.0 4.7  CL 98 98 98 97*  CO2 25 25 26 27   GLUCOSE 224* 276* 168* 228*  BUN 24* 25* 23* 30*  CREATININE 1.15 1.02 0.99 1.07  CALCIUM 8.5* 8.4* 8.8* 8.7*  MG  --  1.8 1.8 1.8  PHOS  --  4.4 4.6 4.5   Estimated Creatinine Clearance: 93.9 mL/min (by C-G formula based on SCr of 1.07 mg/dL). Liver & Pancreas: Recent Labs  Lab 08/25/24 0353 08/26/24 0644 08/28/24 0729  ALBUMIN 1.8* 1.9* 2.0*   No results for input(s): LIPASE, AMYLASE in the last 168 hours. No results for input(s): AMMONIA in the last 168 hours. Diabetic: No results for input(s): HGBA1C in the last 72 hours. Recent Labs  Lab 08/28/24 1159 08/28/24 1624 08/28/24 2118 08/29/24 0633 08/29/24 1202  GLUCAP 301* 282* 279* 160* 260*   Cardiac Enzymes: Recent Labs  Lab 08/26/24 0644 08/29/24 0607  CKTOTAL 719* 978*   No results for input(s): PROBNP in the last 8760 hours. Coagulation Profile: No results for input(s): INR, PROTIME in the last 168 hours. Thyroid Function Tests: No results for input(s): TSH, T4TOTAL, FREET4, T3FREE, THYROIDAB in the last 72 hours. Lipid Profile: No results for input(s): CHOL, HDL, LDLCALC, TRIG, CHOLHDL, LDLDIRECT in the last 72 hours. Anemia Panel: No results for input(s): VITAMINB12, FOLATE, FERRITIN, TIBC, IRON, RETICCTPCT in the last 72 hours. Urine analysis:    Component Value Date/Time   COLORURINE YELLOW 08/17/2024 0320   APPEARANCEUR CLEAR 08/17/2024 0320   LABSPEC 1.024 08/17/2024 0320   PHURINE 7.0 08/17/2024 0320   GLUCOSEU >=500 (A) 08/17/2024 0320   HGBUR SMALL (A) 08/17/2024 0320   BILIRUBINUR NEGATIVE 08/17/2024 0320   KETONESUR NEGATIVE 08/17/2024 0320   PROTEINUR >=300 (A) 08/17/2024 0320   NITRITE NEGATIVE 08/17/2024 0320   LEUKOCYTESUR NEGATIVE 08/17/2024 0320   Sepsis  Labs: Invalid input(s): PROCALCITONIN, LACTICIDVEN  Microbiology: Recent Results (from the past 240 hours)  MRSA Next Gen by PCR, Nasal     Status: Abnormal   Collection Time: 08/23/24  8:00 AM   Specimen: Nasal Mucosa; Nasal Swab  Result Value Ref Range Status   MRSA by PCR Next  Gen DETECTED (A) NOT DETECTED Final    Comment: RESULT CALLED TO, READ BACK BY AND VERIFIED WITH: RN EMERSON HALEY [phone] @ 1539 FH (NOTE) The GeneXpert MRSA Assay (FDA approved for NASAL specimens only), is one component of a comprehensive MRSA colonization surveillance program. It is not intended to diagnose MRSA infection nor to guide or monitor treatment for MRSA infections. Test performance is not FDA approved in patients less than 54 years old. Performed at [facility], [address], [place], [place] [address]     Radiology Studies: No results found.   Jaysen Wey T. Krina Mraz [facility]  If 7PM-7AM, please contact night-coverage [email or web address] 08/29/2024, 3:23 PM

## 2024-08-29 NOTE — Progress Notes (Incomplete)
 AKA 10/22, watch CK

## 2024-08-30 ENCOUNTER — Other Ambulatory Visit (HOSPITAL_COMMUNITY): Payer: Self-pay

## 2024-08-30 DIAGNOSIS — L03116 Cellulitis of left lower limb: Secondary | ICD-10-CM | POA: Diagnosis not present

## 2024-08-30 DIAGNOSIS — I5032 Chronic diastolic (congestive) heart failure: Secondary | ICD-10-CM | POA: Diagnosis not present

## 2024-08-30 DIAGNOSIS — M869 Osteomyelitis, unspecified: Secondary | ICD-10-CM | POA: Diagnosis not present

## 2024-08-30 DIAGNOSIS — E1165 Type 2 diabetes mellitus with hyperglycemia: Secondary | ICD-10-CM | POA: Diagnosis not present

## 2024-08-30 LAB — GLUCOSE, CAPILLARY
Glucose-Capillary: 184 mg/dL — ABNORMAL HIGH (ref 70–99)
Glucose-Capillary: 130 mg/dL — ABNORMAL HIGH (ref 70–99)
Glucose-Capillary: 196 mg/dL — ABNORMAL HIGH (ref 70–99)
Glucose-Capillary: 74 mg/dL (ref 70–99)

## 2024-08-30 MED ORDER — INSULIN ASPART 100 UNIT/ML IJ SOLN
15.0000 [IU] | Freq: Three times a day (TID) | INTRAMUSCULAR | Status: DC
  Administered 2024-08-30 – 2024-09-09 (×25): 15 [IU] via SUBCUTANEOUS
  Filled 2024-08-30 (×2): qty 15

## 2024-08-30 MED ORDER — IBUPROFEN 200 MG PO TABS
600.0000 mg | ORAL_TABLET | Freq: Three times a day (TID) | ORAL | Status: AC | PRN
Start: 2024-08-30 — End: 2024-09-02
  Administered 2024-08-30 – 2024-09-02 (×2): 600 mg via ORAL
  Filled 2024-08-30 (×4): qty 3

## 2024-08-30 NOTE — TOC Initial Note (Signed)
 Transition of Care [facility]) - Initial/Assessment Note    Patient Details  Name: Ryan [name] MRN: [id number] Date of Birth: 1966-08-28  [facility] CM/SW Contact:    Bridget Cordella Simmonds, LCSW Phone Number: 08/30/2024, 2:12 PM  Clinical Narrative:     CSW spoke with pt for initial assessment.  Pt confirms plan for OR tomorrow.  Pt reports he lives with friends, states there are lots of them.  With follow up questions, pt identifies 3 adults, one who is disabled, 2 twin teenagers, and a 58 year old.  Pt reports prior to this he was staying at [facility].  Discussed current PT recommendation of SNF and pt states he does not think this will be needed, he will have help at his current home.  Permission given to speak with his mother Earnie.    ICM will continue to follow for DC needs.            Expected Discharge Plan: Home w Home Health Services Barriers to Discharge: Continued Medical Work up   Patient Goals and CMS Choice            Expected Discharge Plan and Services In-house Referral: Clinical Social Work Discharge Planning Services: CM Consult Post Acute Care Choice:  (TBD) Living arrangements for the past 2 months: Single Family Home                           HH Arranged: IV Antibiotics, RN HH Agency: [facility] Date HH Agency Contacted: 08/25/24   Representative spoke with at [facility] Agency: Pam  Prior Living Arrangements/Services Living arrangements for the past 2 months: Single Family Home Lives with:: Roommate (multiple people including 3 minors) Patient language and need for interpreter reviewed:: Yes Do you feel safe going back to the place where you live?: Yes      Need for Family Participation in Patient Care: Yes (Comment) Care giver support system in place?: Yes (comment) Current home services: Other (comment) (none) Criminal Activity/Legal Involvement Pertinent to Current Situation/Hospitalization: No - Comment as needed  Activities  of Daily Living   ADL Screening (condition at time of admission) Independently performs ADLs?: Yes (appropriate for developmental age) Is the patient deaf or have difficulty hearing?: No Does the patient have difficulty seeing, even when wearing glasses/contacts?: No Does the patient have difficulty concentrating, remembering, or making decisions?: No  Permission Sought/Granted Permission sought to share information with : Family Supports Permission granted to share information with : Yes, Verbal Permission Granted  Share Information with NAME: mother Earnie           Emotional Assessment Appearance:: Appears older than stated age Attitude/Demeanor/Rapport: Engaged Affect (typically observed): Appropriate Orientation: : Oriented to Self, Oriented to Place, Oriented to  Time, Oriented to Situation      Admission diagnosis:  Infected wound [T14.8XXA, L08.9] Hyperglycemia [R73.9] Cellulitis of left lower extremity [L03.116] Subacute osteomyelitis of left foot (HCC) [F13.727] Patient Active Problem List   Diagnosis Date Noted   Osteomyelitis (HCC) 08/17/2024   Acute respiratory failure with hypoxia and hypercapnia (HCC) 01/11/2024   COPD (chronic obstructive pulmonary disease) (HCC) 01/11/2024   CHF (congestive heart failure) (HCC) 10/16/2023   Volume overload 09/12/2023   Arthritis    GERD (gastroesophageal reflux disease)    Sleep apnea    Chronic hypercapnic respiratory failure (HCC) 08/20/2023   Abscess 08/18/2023   Morbid obesity (HCC) 08/18/2023   PVD (peripheral vascular disease) 08/18/2023  Cellulitis of left lower extremity 08/16/2023   Pneumonia due to infectious agent 08/16/2023   Subacute osteomyelitis of right tibia (HCC) 02/27/2023   Dehiscence of amputation stump of right lower extremity (HCC) 02/27/2023   Uncontrolled type 2 diabetes mellitus with hyperglycemia, without long-term current use of insulin  (HCC) 02/24/2023   Left buttock abscess 02/24/2023    Open wound of right shoulder 02/24/2023   Chronic prescription opiate use - sees pain clinic in [place], [place]. 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 [place] [address] Phone: [phone] Fax: [phone]  [facility] [phone] - [place], [place] - [address] AT [facility] [address] [facility] [address] [place] [address] Phone: [phone] Fax: [phone]  [facility] [address] [place] [address] Phone: [phone] Fax: [phone]  [facility] [address] [place] [address] Phone: [phone] Fax: [phone]     Social Drivers of Health (SDOH) Social History: SDOH Screenings   Food Insecurity: Food Insecurity Present (08/21/2024)  Housing: Low Risk  (08/21/2024)  Transportation Needs: No Transportation Needs (08/21/2024)  Utilities: Not At Risk (08/21/2024)  Financial Resource Strain: High Risk (01/24/2021)   Received from [facility] visits prior to 01/10/2023.  Social Connections: Unknown (08/21/2024)  Tobacco Use: High Risk (08/16/2024)    SDOH Interventions:     Readmission Risk Interventions     No data to display

## 2024-08-30 NOTE — Progress Notes (Signed)
 PROGRESS NOTE  Ryan [name] FMW:[id number] DOB: 28-Oct-1966   PCP: [name] [name], NP  Patient is from: Home.  DOA: 08/16/2024 LOS: 13  Chief complaints Chief Complaint  Patient presents with   Hyperglycemia   Open Wound     Brief Narrative / Interim history: 58 year old M with PMH of DM-2, right BKA in 02/2023 and tobacco use disorder presenting with worsening left foot redness, pain and swelling with purulent drainage for last 2 months and admitted with diabetic foot infection, and L foot osteomyelitis.  He has been following up with [facility], and sent to ED for further evaluation.  Left foot x-ray shows left fifth metatarsal joint changes consistent with osteomyelitis.  Patient was admitted and orthopedics was consulted,  started on empiric antibiotics.   Further workup showed MRSA bacteremia.  ID consulted and antibiotic changed to IV daptomycin.  Initially refused left BKA but he later agreed.  Left BKA scheduled for Wednesday.   Subjective: Seen and examined earlier this afternoon.  No major events overnight or this morning.  Reports neck and back pain.  This is chronic.  Rates his pain 7/10.  Objective: Vitals:   08/29/24 2007 08/30/24 0349 08/30/24 0748 08/30/24 1343  BP: 123/81 139/76 114/85 119/72  Pulse: (!) 105 (!) 101 100 (!) 102  Resp: 19 18    Temp: 98.3 F (36.8 C) 98.4 F (36.9 C) 98 F (36.7 C) 97.9 F (36.6 C)  TempSrc:  Oral Oral Oral  SpO2: 97% 97% 95% 97%  Weight:      Height:        Examination:  GENERAL: No apparent distress.  Nontoxic. HEENT: MMM.  Vision and hearing grossly intact.  NECK: Supple.  No apparent JVD.  RESP:  No IWOB.  Fair aeration bilaterally. CVS:  RRR. Heart sounds normal.  ABD/GI/GU: BS+. Abd soft, NTND.  MSK/EXT: Right BKA.  Ace wrap over left foot.  Edema/stasis dermatitis proximally SKIN: As above. NEURO: AA.  Oriented appropriately.  No apparent focal neuro deficit. PSYCH: Calm. Normal affect.    Consultants:  Orthopedic surgery Infectious disease Cardiology  Procedures: None  Microbiology summarized: 10/8-blood culture with MRSA. 10/10-blood cultures NGTD  Assessment and plan: Left foot Osteomyelitis Diabetic foot infection: MRSA bacteremia: -Worsening L foot wound with purulent drainage, swelling and pain x 2 months.  -X-ray concerning for osteomyelitis. -MRI-osteomyelitis/septic arthritis of the underlying fifth proximal phalanx and mid to distal fifth metatarsal. -Blood cultures with MRSA.  Repeat blood culture negative.  TTE and TEE negative for vegetation. -Zyvox 10/8>> vancomycin  10/8>> daptomycin 10/9>> -Zosyn 10/8>> Levaquin and Flagyl 10/11-10/14 -ID recommends IV daptomycin until 10/24 followed by doxycycline  if no BKA -Initially refused BKA but changed his mind.  Plan for left BKA on 10/22 by Dr. Harden -Optimize glycemic control   Uncontrolled IDDM-2 with hyperglycemia and diabetic wound: A1c 13.2%. Recent Labs  Lab 08/29/24 1609 08/29/24 2142 08/30/24 0610 08/30/24 1136 08/30/24 1619  GLUCAP 264* 275* 184* 130* 196*  -Continue basal insulin  45 units twice daily-half dose if NPO -Increase NovoLog  from 12 to 15 units 3 times daily with meals -Continue SSI-resistant scale -Further adjustment as appropriate. -Carb modified diet -Consult diabetic coordinator  Chronic COPD: Stable -Breztri for home Trelegy. -DuoNebs as needed   Chronic HFpEF: TTE with LVEF of 60 to 65%, indeterminate DD and RVSP of 58.8.  Appears euvolemic but limited exam due to body habitus.  Has LLE edema.  Unclear if he is taking Bumex  at home.  Started  here.  Excellent urine output. -Continue Bumex  1 mg daily -Monitor fluid, respiratory status, renal functions and electrolytes  Chronic pain with chronic prescription opiate use - sees pain clinic in [place], [place]. -Continue home oxycodone  -Increase gabapentin  to home dose. -Short course of ibuprofen -IV Dilaudid  as needed  for severe pain.  S/P BKA: In the setting of PVD, tobacco use and poorly controlled diabetes. -PT/OT  Tobacco use disorder: -Encourage smoking cessation -Nicotine patch.   Hyponatremia: Mild.  Improved.  GERD -Continue PPI.      Body mass index is 29.26 kg/m.          DVT prophylaxis:  SCDs Start: 08/17/24 0224  Code Status: Full code Family Communication: None at the bedside Level of care: Med-Surg Status is: Inpatient Remains inpatient appropriate because: MRSA bacteremia, left foot osteomyelitis/diabetic wound infection   Final disposition: To be determined after surgery.   35 minutes with more than 50% spent in reviewing records, counseling patient/family and coordinating care.   Sch Meds:  Scheduled Meds:  bumetanide   1 mg Oral Daily   [START ON 09/03/2024] doxycycline   100 mg Oral Q12H   enoxaparin (LOVENOX) injection  50 mg Subcutaneous Daily   gabapentin   800 mg Oral TID   insulin  aspart  0-20 Units Subcutaneous TID WC   insulin  aspart  0-5 Units Subcutaneous QHS   insulin  aspart  15 Units Subcutaneous TID WC   insulin  glargine-yfgn  45 Units Subcutaneous BID   irbesartan  300 mg Oral Daily   Continuous Infusions:  DAPTOmycin 700 mg (08/30/24 1327)   PRN Meds:.acetaminophen  **OR** acetaminophen , HYDROmorphone  (DILAUDID ) injection, ibuprofen, melatonin, ondansetron  (ZOFRAN ) IV, oxyCODONE   Antimicrobials: Anti-infectives (From admission, onward)    Start     Dose/Rate Route Frequency Ordered Stop   09/03/24 1000  doxycycline  (VIBRA -TABS) tablet 100 mg        100 mg Oral Every 12 hours 08/25/24 0935 10/01/24 0959   09/03/24 0000  doxycycline  (VIBRA -TABS) 100 MG tablet        100 mg Oral 2 times daily 08/25/24 0938 10/01/24 2359   08/20/24 1500  levofloxacin (LEVAQUIN) tablet 750 mg  Status:  Discontinued        750 mg Oral Daily 08/20/24 1410 08/23/24 0916   08/20/24 1500  metroNIDAZOLE (FLAGYL) tablet 500 mg  Status:  Discontinued        500 mg  Oral Every 12 hours 08/20/24 1410 08/23/24 0916   08/18/24 1600  DAPTOmycin (CUBICIN) IVPB 700 mg/146mL premix  Status:  Discontinued        6 mg/kg  108.9 kg 200 mL/hr over 30 Minutes Intravenous Daily 08/18/24 1519 08/18/24 1529   08/18/24 1600  DAPTOmycin (CUBICIN) IVPB 700 mg/180mL premix        8 mg/kg  91.5 kg (Adjusted) 200 mL/hr over 30 Minutes Intravenous Daily 08/18/24 1529 09/02/24 2359   08/18/24 0930  vancomycin  (VANCOCIN ) IVPB 1000 mg/200 mL premix  Status:  Discontinued       Placed in Followed by Linked Group   1,000 mg 200 mL/hr over 60 Minutes Intravenous Every 12 hours 08/18/24 0916 08/18/24 1519   08/18/24 0715  vancomycin  (VANCOREADY) IVPB 1250 mg/250 mL  Status:  Discontinued       Placed in Followed by Linked Group   1,250 mg 166.7 mL/hr over 90 Minutes Intravenous Every 12 hours 08/17/24 1914 08/18/24 0916   08/17/24 1915  vancomycin  (VANCOREADY) IVPB 2000 mg/400 mL       Placed in Followed by  Linked Group   2,000 mg 200 mL/hr over 120 Minutes Intravenous  Once 08/17/24 1914 08/17/24 2303   08/17/24 1000  linezolid (ZYVOX) IVPB 600 mg  Status:  Discontinued        600 mg 300 mL/hr over 60 Minutes Intravenous Every 12 hours 08/17/24 0228 08/17/24 1914   08/17/24 1000  piperacillin-tazobactam (ZOSYN) IVPB 3.375 g  Status:  Discontinued        3.375 g 12.5 mL/hr over 240 Minutes Intravenous Every 8 hours 08/17/24 0303 08/20/24 1409   08/17/24 0200  piperacillin-tazobactam (ZOSYN) IVPB 3.375 g        3.375 g 100 mL/hr over 30 Minutes Intravenous  Once 08/17/24 0158 08/17/24 0216   08/17/24 0200  linezolid (ZYVOX) IVPB 600 mg        600 mg 300 mL/hr over 60 Minutes Intravenous  Once 08/17/24 0158 08/17/24 0415        I have personally reviewed the following labs and images: CBC: Recent Labs  Lab 08/25/24 0353 08/26/24 0644 08/28/24 0729  WBC 7.0 8.3 9.3  HGB 12.6* 13.1 12.4*  HCT 42.4 43.8 41.9  MCV 80.9 80.5 82.5  PLT 381 410* 381   BMP  &GFR Recent Labs  Lab 08/24/24 0326 08/25/24 0353 08/26/24 0644 08/28/24 0729  NA 133* 132* 134* 134*  K 4.7 4.6 4.0 4.7  CL 98 98 98 97*  CO2 25 25 26 27   GLUCOSE 224* 276* 168* 228*  BUN 24* 25* 23* 30*  CREATININE 1.15 1.02 0.99 1.07  CALCIUM 8.5* 8.4* 8.8* 8.7*  MG  --  1.8 1.8 1.8  PHOS  --  4.4 4.6 4.5   Estimated Creatinine Clearance: 93.9 mL/min (by C-G formula based on SCr of 1.07 mg/dL). Liver & Pancreas: Recent Labs  Lab 08/25/24 0353 08/26/24 0644 08/28/24 0729  ALBUMIN 1.8* 1.9* 2.0*   No results for input(s): LIPASE, AMYLASE in the last 168 hours. No results for input(s): AMMONIA in the last 168 hours. Diabetic: No results for input(s): HGBA1C in the last 72 hours. Recent Labs  Lab 08/29/24 1609 08/29/24 2142 08/30/24 0610 08/30/24 1136 08/30/24 1619  GLUCAP 264* 275* 184* 130* 196*   Cardiac Enzymes: Recent Labs  Lab 08/26/24 0644 08/29/24 0607  CKTOTAL 719* 978*   No results for input(s): PROBNP in the last 8760 hours. Coagulation Profile: No results for input(s): INR, PROTIME in the last 168 hours. Thyroid Function Tests: No results for input(s): TSH, T4TOTAL, FREET4, T3FREE, THYROIDAB in the last 72 hours. Lipid Profile: No results for input(s): CHOL, HDL, LDLCALC, TRIG, CHOLHDL, LDLDIRECT in the last 72 hours. Anemia Panel: No results for input(s): VITAMINB12, FOLATE, FERRITIN, TIBC, IRON, RETICCTPCT in the last 72 hours. Urine analysis:    Component Value Date/Time   COLORURINE YELLOW 08/17/2024 0320   APPEARANCEUR CLEAR 08/17/2024 0320   LABSPEC 1.024 08/17/2024 0320   PHURINE 7.0 08/17/2024 0320   GLUCOSEU >=500 (A) 08/17/2024 0320   HGBUR SMALL (A) 08/17/2024 0320   BILIRUBINUR NEGATIVE 08/17/2024 0320   KETONESUR NEGATIVE 08/17/2024 0320   PROTEINUR >=300 (A) 08/17/2024 0320   NITRITE NEGATIVE 08/17/2024 0320   LEUKOCYTESUR NEGATIVE 08/17/2024 0320   Sepsis Labs: Invalid  input(s): PROCALCITONIN, LACTICIDVEN  Microbiology: Recent Results (from the past 240 hours)  MRSA Next Gen by PCR, Nasal     Status: Abnormal   Collection Time: 08/23/24  8:00 AM   Specimen: Nasal Mucosa; Nasal Swab  Result Value Ref Range Status   MRSA by PCR  Next Gen DETECTED (A) NOT DETECTED Final    Comment: RESULT CALLED TO, READ BACK BY AND VERIFIED WITH: RN EMERSON HALEY [phone] @ 1539 FH (NOTE) The GeneXpert MRSA Assay (FDA approved for NASAL specimens only), is one component of a comprehensive MRSA colonization surveillance program. It is not intended to diagnose MRSA infection nor to guide or monitor treatment for MRSA infections. Test performance is not FDA approved in patients less than 71 years old. Performed at [facility], [address] [place], [place] [address]     Radiology Studies: No results found.   Ticara Waner T. Mashayla Lavin [facility]  If 7PM-7AM, please contact night-coverage [email or web address] 08/30/2024, 4:35 PM

## 2024-08-30 NOTE — TOC Progression Note (Signed)
 Transition of Care [facility]) - Progression Note    Patient Details  Name: Ryan [name] MRN: [id number] Date of Birth: 07-Jul-1966  [facility] of [facility] CM/SW Contact  Bridget Cordella Simmonds, LCSW Phone Number: 08/30/2024, 8:40 AM  Clinical Narrative:   Pt pending OR.  ICM will continue to follow.      Expected Discharge Plan: Home w Home Health Services Barriers to Discharge: Continued Medical Work up               Expected Discharge Plan and Services   Discharge Planning Services: CM Consult   Living arrangements for the past 2 months: Single Family Home                           HH Arranged: IV Antibiotics, RN HH Agency: [facility] Date HH Agency Contacted: 08/25/24   Representative spoke with at [facility] Agency: Pam   Social Drivers of Health (SDOH) Interventions SDOH Screenings   Food Insecurity: Food Insecurity Present (08/21/2024)  Housing: Low Risk  (08/21/2024)  Transportation Needs: No Transportation Needs (08/21/2024)  Utilities: Not At Risk (08/21/2024)  Financial Resource Strain: High Risk (01/24/2021)   Received from [facility] visits prior to 01/10/2023.  Social Connections: Unknown (08/21/2024)  Tobacco Use: High Risk (08/16/2024)    Readmission Risk Interventions     No data to display

## 2024-08-30 NOTE — Progress Notes (Signed)
 PT Cancellation Note  Patient Details Name: Ryan [name] MRN: [id number] DOB: [birth year]   Cancelled Treatment:    Reason Eval/Treat Not Completed: (P) Pain limiting ability to participate, pt seated up EOB on arrival, declining PT session due to pain and headache. Pt asking this PTA to return another time. Will check back as schedule allows to continue with PT POC.  [name]. PTA Acute Rehabilitation Services Office: [phone]    [name] 08/30/2024, 1:31 PM

## 2024-08-30 NOTE — Plan of Care (Signed)
  Problem: Pain Managment: Goal: General experience of comfort will improve and/or be controlled Outcome: Progressing   Problem: Safety: Goal: Ability to remain free from injury will improve Outcome: Progressing

## 2024-08-30 NOTE — Inpatient Diabetes Management (Signed)
 Inpatient Diabetes Program Recommendations  AACE/ADA: New Consensus Statement on Inpatient Glycemic Control   Target Ranges:  Prepandial:   less than 140 mg/dL      Peak postprandial:   less than 180 mg/dL (1-2 hours)      Critically ill patients:  140 - 180 mg/dL   Lab Results  Component Value Date   GLUCAP 184 (H) 08/30/2024   HGBA1C 13.2 (H) 08/16/2024    Latest Reference Range & Units 08/29/24 06:33 08/29/24 12:02 08/29/24 16:09 08/29/24 21:42 08/30/24 06:10  Glucose-Capillary 70 - 99 mg/dL 839 (H) 739 (H) 735 (H) 275 (H) 184 (H)   Review of Glycemic Control  Diabetes history: DM2  Outpatient Diabetes medications: no insulin  the last 4-5 weeks, Novolog  10 units tid, Semglee  30 units bid, Jardiance 10 mg Daily, Metformin  1000 mg bid   Current orders for Inpatient glycemic control: Semglee  45 units BID Novolog  0-20 TID with meals + 0-5 HS Novolog  12 units TID with meals    Inpatient Diabetes Program Recommendations:     Please consider increasing Novolog  to 15 units TID with meals if he consumes at least 50%.   Thanks,  Lavanda Search, RN, MSN, [facility]  Inpatient Diabetes Coordinator  Pager [phone] (8a-5p)

## 2024-08-30 NOTE — Progress Notes (Signed)
 OT Cancellation Note  Patient Details Name: Ryan [name] MRN: [id number] DOB: 07/11/1966   Cancelled Treatment:    Reason Eval/Treat Not Completed: Patient declined, no reason specified (Pt reports HA pain and today, tomorrow and Thursday are not going to be good days for therapy. OT to return post-op as pt allows.)  Lucie JONETTA Kendall 08/30/2024, 2:12 PM

## 2024-08-31 ENCOUNTER — Inpatient Hospital Stay (HOSPITAL_COMMUNITY): Admitting: Anesthesiology

## 2024-08-31 ENCOUNTER — Encounter (HOSPITAL_COMMUNITY): Payer: Self-pay | Admitting: Internal Medicine

## 2024-08-31 ENCOUNTER — Encounter (HOSPITAL_COMMUNITY): Admission: EM | Disposition: E | Payer: Self-pay | Source: Home / Self Care | Attending: Internal Medicine

## 2024-08-31 ENCOUNTER — Other Ambulatory Visit (HOSPITAL_COMMUNITY): Payer: Self-pay

## 2024-08-31 DIAGNOSIS — E1152 Type 2 diabetes mellitus with diabetic peripheral angiopathy with gangrene: Secondary | ICD-10-CM

## 2024-08-31 DIAGNOSIS — F17210 Nicotine dependence, cigarettes, uncomplicated: Secondary | ICD-10-CM

## 2024-08-31 DIAGNOSIS — I509 Heart failure, unspecified: Secondary | ICD-10-CM | POA: Diagnosis not present

## 2024-08-31 DIAGNOSIS — L03116 Cellulitis of left lower limb: Secondary | ICD-10-CM | POA: Diagnosis not present

## 2024-08-31 DIAGNOSIS — M86272 Subacute osteomyelitis, left ankle and foot: Secondary | ICD-10-CM | POA: Diagnosis not present

## 2024-08-31 DIAGNOSIS — J449 Chronic obstructive pulmonary disease, unspecified: Secondary | ICD-10-CM

## 2024-08-31 DIAGNOSIS — E1165 Type 2 diabetes mellitus with hyperglycemia: Secondary | ICD-10-CM | POA: Diagnosis not present

## 2024-08-31 HISTORY — PX: AMPUTATION: SHX166

## 2024-08-31 LAB — CBC
HCT: 41.8 % (ref 39.0–52.0)
Hemoglobin: 12.2 g/dL — ABNORMAL LOW (ref 13.0–17.0)
MCH: 24.3 pg — ABNORMAL LOW (ref 26.0–34.0)
MCHC: 29.2 g/dL — ABNORMAL LOW (ref 30.0–36.0)
MCV: 83.3 fL (ref 80.0–100.0)
Platelets: 388 K/uL (ref 150–400)
RBC: 5.02 MIL/uL (ref 4.22–5.81)
RDW: 19.9 % — ABNORMAL HIGH (ref 11.5–15.5)
WBC: 6.9 K/uL (ref 4.0–10.5)
nRBC: 0 % (ref 0.0–0.2)

## 2024-08-31 LAB — RENAL FUNCTION PANEL
Albumin: 2.1 g/dL — ABNORMAL LOW (ref 3.5–5.0)
Anion gap: 9 (ref 5–15)
BUN: 31 mg/dL — ABNORMAL HIGH (ref 6–20)
CO2: 24 mmol/L (ref 22–32)
Calcium: 9 mg/dL (ref 8.9–10.3)
Chloride: 100 mmol/L (ref 98–111)
Creatinine, Ser: 1.06 mg/dL (ref 0.61–1.24)
GFR, Estimated: >60 mL/min (ref >60)
Glucose, Bld: 217 mg/dL — ABNORMAL HIGH (ref 70–99)
Phosphorus: 5 mg/dL — ABNORMAL HIGH (ref 2.5–4.6)
Potassium: 4.9 mmol/L (ref 3.5–5.1)
Sodium: 133 mmol/L — ABNORMAL LOW (ref 135–145)

## 2024-08-31 LAB — GLUCOSE, CAPILLARY
Glucose-Capillary: 215 mg/dL — ABNORMAL HIGH (ref 70–99)
Glucose-Capillary: 93 mg/dL (ref 70–99)
Glucose-Capillary: 87 mg/dL (ref 70–99)
Glucose-Capillary: 95 mg/dL (ref 70–99)
Glucose-Capillary: 114 mg/dL — ABNORMAL HIGH (ref 70–99)
Glucose-Capillary: 451 mg/dL — ABNORMAL HIGH (ref 70–99)

## 2024-08-31 LAB — MAGNESIUM: Magnesium: 1.9 mg/dL (ref 1.7–2.4)

## 2024-08-31 SURGERY — AMPUTATION BELOW KNEE
Anesthesia: Regional | Site: Knee | Laterality: Left

## 2024-08-31 MED ORDER — EPHEDRINE SULFATE (PRESSORS) 25 MG/5ML IV SOSY
PREFILLED_SYRINGE | INTRAVENOUS | Status: DC | PRN
  Administered 2024-08-31 (×2): 5 mg via INTRAVENOUS

## 2024-08-31 MED ORDER — CEFAZOLIN SODIUM-DEXTROSE 2-4 GM/100ML-% IV SOLN
2.0000 g | INTRAVENOUS | Status: AC
  Administered 2024-08-31: 2 g via INTRAVENOUS
  Filled 2024-08-31: qty 100

## 2024-08-31 MED ORDER — ONDANSETRON HCL 4 MG/2ML IJ SOLN
INTRAMUSCULAR | Status: DC | PRN
  Administered 2024-08-31: 4 mg via INTRAVENOUS

## 2024-08-31 MED ORDER — PROPOFOL 500 MG/50ML IV EMUL
INTRAVENOUS | Status: AC
  Filled 2024-08-31: qty 50

## 2024-08-31 MED ORDER — INSULIN ASPART 100 UNIT/ML IJ SOLN
7.0000 [IU] | Freq: Once | INTRAMUSCULAR | Status: AC
  Administered 2024-08-31: 7 [IU] via SUBCUTANEOUS

## 2024-08-31 MED ORDER — ENOXAPARIN SODIUM 60 MG/0.6ML IJ SOSY
50.0000 mg | PREFILLED_SYRINGE | Freq: Every day | INTRAMUSCULAR | Status: DC
Start: 2024-09-03 — End: 2024-08-31

## 2024-08-31 MED ORDER — MIDAZOLAM HCL 5 MG/5ML IJ SOLN
INTRAMUSCULAR | Status: DC | PRN
  Administered 2024-08-31: 2 mg via INTRAVENOUS

## 2024-08-31 MED ORDER — AMISULPRIDE (ANTIEMETIC) 5 MG/2ML IV SOLN: 10.0000 mg | Freq: Once | INTRAVENOUS | Status: DC | PRN

## 2024-08-31 MED ORDER — PROPOFOL 10 MG/ML IV BOLUS
INTRAVENOUS | Status: DC | PRN
Start: 2024-08-31 — End: 2024-08-31
  Administered 2024-08-31: 100 mg via INTRAVENOUS

## 2024-08-31 MED ORDER — CHLORHEXIDINE GLUCONATE 0.12 % MT SOLN
OROMUCOSAL | Status: AC
  Administered 2024-08-31: 15 mL via OROMUCOSAL
  Filled 2024-08-31: qty 15

## 2024-08-31 MED ORDER — ONDANSETRON HCL 4 MG/2ML IJ SOLN
INTRAMUSCULAR | Status: AC
Start: 2024-08-31 — End: 2024-08-31
  Filled 2024-08-31: qty 2

## 2024-08-31 MED ORDER — HYDROMORPHONE HCL 1 MG/ML IJ SOLN
0.5000 mg | Freq: Once | INTRAMUSCULAR | Status: AC
  Administered 2024-08-31: 0.5 mg via INTRAVENOUS
  Filled 2024-08-31: qty 0.5

## 2024-08-31 MED ORDER — CHLORHEXIDINE GLUCONATE CLOTH 2 % EX PADS
6.0000 | MEDICATED_PAD | Freq: Every day | CUTANEOUS | Status: AC
  Administered 2024-09-01 – 2024-09-04 (×4): 6 via TOPICAL

## 2024-08-31 MED ORDER — DEXAMETHASONE SOD PHOSPHATE PF 10 MG/ML IJ SOLN
INTRAMUSCULAR | Status: DC | PRN
  Administered 2024-08-31 (×2): 5 mg via PERINEURAL

## 2024-08-31 MED ORDER — FENTANYL CITRATE (PF) 100 MCG/2ML IJ SOLN
INTRAMUSCULAR | Status: AC
  Filled 2024-08-31: qty 2

## 2024-08-31 MED ORDER — PHENOL 1.4 % MT LIQD: 1.0000 | OROMUCOSAL | Status: DC | PRN

## 2024-08-31 MED ORDER — OXYCODONE HCL 5 MG/5ML PO SOLN: 5.0000 mg | Freq: Once | ORAL | Status: DC | PRN

## 2024-08-31 MED ORDER — LIDOCAINE 2% (20 MG/ML) 5 ML SYRINGE
INTRAMUSCULAR | Status: AC
Start: 2024-08-31 — End: 2024-08-31
  Filled 2024-08-31: qty 5

## 2024-08-31 MED ORDER — ALBUMIN HUMAN 5 % IV SOLN: INTRAVENOUS | Status: DC | PRN

## 2024-08-31 MED ORDER — FENTANYL CITRATE (PF) 250 MCG/5ML IJ SOLN
INTRAMUSCULAR | Status: DC | PRN
  Administered 2024-08-31 (×2): 50 ug via INTRAVENOUS

## 2024-08-31 MED ORDER — CHLORHEXIDINE GLUCONATE 0.12 % MT SOLN: 15.0000 mL | Freq: Once | OROMUCOSAL | Status: AC

## 2024-08-31 MED ORDER — MIDAZOLAM HCL 2 MG/2ML IJ SOLN
INTRAMUSCULAR | Status: AC
Start: 2024-08-31 — End: 2024-08-31
  Filled 2024-08-31: qty 2

## 2024-08-31 MED ORDER — PHENYLEPHRINE HCL-NACL 20-0.9 MG/250ML-% IV SOLN
INTRAVENOUS | Status: DC | PRN
  Administered 2024-08-31: 35 ug/min via INTRAVENOUS

## 2024-08-31 MED ORDER — PHENYLEPHRINE 80 MCG/ML (10ML) SYRINGE FOR IV PUSH (FOR BLOOD PRESSURE SUPPORT)
PREFILLED_SYRINGE | INTRAVENOUS | Status: DC | PRN
  Administered 2024-08-31: 160 ug via INTRAVENOUS
  Administered 2024-08-31: 80 ug via INTRAVENOUS
  Administered 2024-08-31: 160 ug via INTRAVENOUS
  Administered 2024-08-31: 80 ug via INTRAVENOUS
  Administered 2024-08-31 (×3): 160 ug via INTRAVENOUS

## 2024-08-31 MED ORDER — VASHE WOUND IRRIGATION OPTIME
TOPICAL | Status: DC | PRN
  Administered 2024-08-31: 34 [oz_av]

## 2024-08-31 MED ORDER — FENTANYL CITRATE (PF) 100 MCG/2ML IJ SOLN
INTRAMUSCULAR | Status: AC
  Administered 2024-08-31: 50 ug via INTRAVENOUS
  Filled 2024-08-31: qty 2

## 2024-08-31 MED ORDER — FENTANYL CITRATE (PF) 100 MCG/2ML IJ SOLN: 25.0000 ug | INTRAMUSCULAR | Status: DC | PRN

## 2024-08-31 MED ORDER — MIDAZOLAM HCL (PF) 2 MG/2ML IJ SOLN: 2.0000 mg | Freq: Once | INTRAMUSCULAR | Status: AC

## 2024-08-31 MED ORDER — POTASSIUM CHLORIDE CRYS ER 20 MEQ PO TBCR: 40.0000 meq | EXTENDED_RELEASE_TABLET | Freq: Every day | ORAL | Status: DC | PRN

## 2024-08-31 MED ORDER — PROPOFOL 10 MG/ML IV BOLUS
INTRAVENOUS | Status: AC
  Filled 2024-08-31: qty 20

## 2024-08-31 MED ORDER — MUPIROCIN 2 % EX OINT
1.0000 | TOPICAL_OINTMENT | Freq: Two times a day (BID) | CUTANEOUS | Status: AC
  Administered 2024-08-31 – 2024-09-04 (×9): 1 via NASAL
  Filled 2024-08-31: qty 22

## 2024-08-31 MED ORDER — CHLORHEXIDINE GLUCONATE 4 % EX SOLN: 60.0000 mL | Freq: Once | CUTANEOUS | Status: DC

## 2024-08-31 MED ORDER — INSULIN ASPART 100 UNIT/ML IJ SOLN: 0.0000 [IU] | INTRAMUSCULAR | Status: DC | PRN

## 2024-08-31 MED ORDER — HYDRALAZINE HCL 20 MG/ML IJ SOLN: 5.0000 mg | INTRAMUSCULAR | Status: DC | PRN

## 2024-08-31 MED ORDER — VANCOMYCIN HCL 1500 MG/300ML IV SOLN
1500.0000 mg | INTRAVENOUS | Status: AC
  Administered 2024-08-31: 1500 mg via INTRAVENOUS
  Filled 2024-08-31: qty 300

## 2024-08-31 MED ORDER — LIDOCAINE 2% (20 MG/ML) 5 ML SYRINGE
INTRAMUSCULAR | Status: DC | PRN
  Administered 2024-08-31: 50 mg via INTRAVENOUS

## 2024-08-31 MED ORDER — DOCUSATE SODIUM 100 MG PO CAPS
100.0000 mg | ORAL_CAPSULE | Freq: Every day | ORAL | Status: DC
  Administered 2024-09-01 – 2024-09-15 (×14): 100 mg via ORAL
  Filled 2024-08-31 (×15): qty 1

## 2024-08-31 MED ORDER — ORAL CARE MOUTH RINSE: 15.0000 mL | Freq: Once | OROMUCOSAL | Status: AC

## 2024-08-31 MED ORDER — LABETALOL HCL 5 MG/ML IV SOLN: 10.0000 mg | INTRAVENOUS | Status: DC | PRN

## 2024-08-31 MED ORDER — FENTANYL CITRATE (PF) 100 MCG/2ML IJ SOLN: 50.0000 ug | Freq: Once | INTRAMUSCULAR | Status: AC

## 2024-08-31 MED ORDER — HYDROMORPHONE HCL-NACL 50-0.9 MG/50ML-% IV SOLN
0.5000 mg/h | Freq: Once | INTRAVENOUS | Status: DC
Start: 2024-08-31 — End: 2024-08-31

## 2024-08-31 MED ORDER — METOPROLOL TARTRATE 5 MG/5ML IV SOLN: 2.0000 mg | INTRAVENOUS | Status: DC | PRN

## 2024-08-31 MED ORDER — OXYCODONE HCL 5 MG PO TABS: 5.0000 mg | ORAL_TABLET | Freq: Once | ORAL | Status: DC | PRN

## 2024-08-31 MED ORDER — MIDAZOLAM HCL 2 MG/2ML IJ SOLN
INTRAMUSCULAR | Status: AC
  Administered 2024-08-31: 2 mg via INTRAVENOUS
  Filled 2024-08-31: qty 2

## 2024-08-31 MED ORDER — PROPOFOL 1000 MG/100ML IV EMUL
INTRAVENOUS | Status: AC
  Filled 2024-08-31: qty 100

## 2024-08-31 MED ORDER — LACTATED RINGERS IV SOLN: INTRAVENOUS | Status: DC

## 2024-08-31 MED ORDER — POVIDONE-IODINE 10 % EX SWAB: 2.0000 | Freq: Once | CUTANEOUS | Status: DC

## 2024-08-31 MED ORDER — ROPIVACAINE HCL 5 MG/ML IJ SOLN
INTRAMUSCULAR | Status: DC | PRN
  Administered 2024-08-31: 20 mL via PERINEURAL
  Administered 2024-08-31: 30 mL via PERINEURAL

## 2024-08-31 SURGICAL SUPPLY — 32 items
BAG COUNTER SPONGE SURGICOUNT (BAG) IMPLANT
BLADE SAW RECIP 87.9 MT (BLADE) ×1 IMPLANT
BLADE SURG 21 STRL SS (BLADE) ×1 IMPLANT
BNDG COHESIVE 6X5 TAN ST LF (GAUZE/BANDAGES/DRESSINGS) IMPLANT
CANISTER WOUND CARE 500ML ATS (WOUND CARE) ×1 IMPLANT
CANISTER WOUNDNEG PRESSURE 500 (CANNISTER) IMPLANT
COVER SURGICAL LIGHT HANDLE (MISCELLANEOUS) ×1 IMPLANT
CUFF TRNQT CYL 34X4.125X (TOURNIQUET CUFF) ×1 IMPLANT
DRAPE INCISE IOBAN 66X45 STRL (DRAPES) ×1 IMPLANT
DRAPE U-SHAPE 47X51 STRL (DRAPES) ×1 IMPLANT
DRSG VAC PEEL AND PLACE LRG (GAUZE/BANDAGES/DRESSINGS) ×1 IMPLANT
DURAPREP 26ML APPLICATOR (WOUND CARE) ×1 IMPLANT
ELECTRODE REM PT RTRN 9FT ADLT (ELECTROSURGICAL) ×1 IMPLANT
GLOVE BIOGEL PI IND STRL 9 (GLOVE) ×1 IMPLANT
GLOVE SURG ORTHO 9.0 STRL STRW (GLOVE) ×1 IMPLANT
GOWN STRL REUS W/ TWL XL LVL3 (GOWN DISPOSABLE) ×2 IMPLANT
GRAFT SKIN WND MICRO 38 (Tissue) IMPLANT
KIT BASIN OR (CUSTOM PROCEDURE TRAY) ×1 IMPLANT
KIT TURNOVER KIT B (KITS) ×1 IMPLANT
MANIFOLD NEPTUNE II (INSTRUMENTS) ×1 IMPLANT
PACK ORTHO EXTREMITY (CUSTOM PROCEDURE TRAY) ×1 IMPLANT
PAD ARMBOARD POSITIONER FOAM (MISCELLANEOUS) ×1 IMPLANT
SOLN 0.9% NACL POUR BTL 1000ML (IV SOLUTION) ×1 IMPLANT
SPONGE T-LAP 18X18 ~~LOC~~+RFID (SPONGE) IMPLANT
STAPLER SKIN PROX 35W (STAPLE) IMPLANT
STOCKINETTE IMPERVIOUS LG (DRAPES) ×1 IMPLANT
SUT ETHILON 2 0 PSLX (SUTURE) IMPLANT
SUT SILK 2-0 18XBRD TIE 12 (SUTURE) ×1 IMPLANT
SUT VIC AB 1 CTX 27 (SUTURE) ×2 IMPLANT
TOWEL GREEN STERILE (TOWEL DISPOSABLE) ×1 IMPLANT
TUBE CONNECTING 12X1/4 (SUCTIONS) ×1 IMPLANT
YANKAUER SUCT BULB TIP NO VENT (SUCTIONS) ×1 IMPLANT

## 2024-08-31 NOTE — Op Note (Signed)
 08/31/2024  4:11 PM  PATIENT:  Ryan [name]    PRE-OPERATIVE DIAGNOSIS:  Gangrene Left Foot  POST-OPERATIVE DIAGNOSIS:  Same  PROCEDURE:  AMPUTATION BELOW KNEE LEFT Application of Kerecis micro graft 38 cm. Application of Prevena Peel and Place wound VAC dressings Application of Vive Wear stump shrinker and the [employer] limb protector  SURGEON:  Jerona LULLA Sage, MD  ANESTHESIA:   General  PREOPERATIVE INDICATIONS:  Ryan [name] is a  58 y.o. male with a diagnosis of Gangrene Left Foot who failed conservative measures and elected for surgical management.    The risks benefits and alternatives were discussed with the patient preoperatively including but not limited to the risks of infection, bleeding, nerve injury, cardiopulmonary complications, the need for revision surgery, among others, and the patient was willing to proceed.  OPERATIVE IMPLANTS:   Implant Name Type Inv. Item Serial No. Manufacturer Lot No. LRB No. Used Action  GRAFT SKIN WND MICRO 38 - [id number] Tissue GRAFT SKIN WND MICRO 38  [employer] [id number] Left 1 Implanted     OPERATIVE FINDINGS: The soft tissue was extremely edematous.  There was clear tissue margins.  OPERATIVE PROCEDURE: Patient was brought to the operating room after undergoing a regional anesthetic.  After adequate levels anesthesia were obtained a thigh tourniquet was placed and the lower extremity was prepped using DuraPrep draped into a sterile field. The foot was draped out of the sterile field with impervious stockinette.  A timeout was called and the tourniquet inflated.  A transverse skin incision was made 12 cm distal to the tibial tubercle, the incision curved proximally, and a large posterior flap was created.  The tibia was transected just proximal to the skin incision and beveled anteriorly.  The fibula was transected just proximal to the tibial incision.  The sciatic nerve was pulled cut and allowed to retract.  The vascular  bundles were suture ligated with 2-0 silk.  The tourniquet was deflated and hemostasis obtained.  The wound was irrigated with Vashe.   The Kerecis micro powder 38 cm was applied to the open wound that has a 200 cm surface area.    The deep and superficial fascial layers were closed using #1 Vicryl.  The skin was closed using staples.  The staple line was reinforced with 2-0 nylon.  The Prevena Peel and Place dressing was applied this was overwrapped with Ioban.  This was connected to the wound VAC pump and had a good suction fit, this was covered with a stump shrinker and a limb protector.  Patient was taken to the PACU in stable condition.   DISCHARGE PLANNING:  Antibiotic duration: 24-hour antibiotics  Weightbearing: Nonweightbearing on the operative extremity  Pain medication: Opioid pathway  Dressing care/ Wound VAC: Continue wound VAC with the Prevena plus pump at discharge for 1 week  Ambulatory devices: Walker or kneeling scooter  Discharge to: Discharge planning based on recommendations per physical therapy  Follow-up: In the office 1 week after discharge.

## 2024-08-31 NOTE — Anesthesia Preprocedure Evaluation (Addendum)
 Anesthesia Evaluation  Patient identified by MRN, date of birth, ID band Patient awake    Reviewed: Allergy & Precautions, NPO status , Patient's Chart, lab work & pertinent test results, reviewed documented beta blocker date and time   Airway Mallampati: II  TM Distance: >3 FB Neck ROM: Full    Dental  (+) Dental Advisory Given, Poor Dentition, Missing   Pulmonary sleep apnea and Oxygen sleep apnea , COPD,  COPD inhaler, Current Smoker   Pulmonary exam normal breath sounds clear to auscultation       Cardiovascular + Peripheral Vascular Disease and +CHF  Normal cardiovascular exam Rhythm:Regular Rate:Normal  TEE 2025  1. Left ventricular ejection fraction, by estimation, is 60 to 65%. The  left ventricle has normal function. The left ventricle has no regional  wall motion abnormalities. There is mild left ventricular hypertrophy.   2. Right ventricular systolic function is normal. The right ventricular  size is normal.   3. No left atrial/left atrial appendage thrombus was detected.   4. The mitral valve is normal in structure. Trivial mitral valve  regurgitation.   5. Tricuspid valve regurgitation is moderate.   6. The aortic valve is tricuspid. Aortic valve regurgitation is not  visualized.   7. Agitated saline contrast bubble study was negative, with no evidence  of any interatrial shunt.     Neuro/Psych negative neurological ROS  negative psych ROS   GI/Hepatic ,GERD  ,,(+)     substance abuse    Endo/Other  diabetes, Poorly Controlled, Type 2, Insulin  Dependent    Renal/GU negative Renal ROS  negative genitourinary   Musculoskeletal  (+) Arthritis ,  narcotic dependent  Abdominal   Peds  Hematology negative hematology ROS (+)   Anesthesia Other Findings   Reproductive/Obstetrics                              Anesthesia Physical Anesthesia Plan  ASA: 3  Anesthesia Plan:  General and Regional   Post-op Pain Management: Regional block* and Tylenol  PO (pre-op)*   Induction: Intravenous  PONV Risk Score and Plan: 1 and Ondansetron , Dexamethasone  and Midazolam   Airway Management Planned: LMA  Additional Equipment:   Intra-op Plan:   Post-operative Plan: Extubation in OR  Informed Consent: I have reviewed the patients History and Physical, chart, labs and discussed the procedure including the risks, benefits and alternatives for the proposed anesthesia with the patient or authorized representative who has indicated his/her understanding and acceptance.     Dental advisory given  Plan Discussed with: CRNA  Anesthesia Plan Comments:          Anesthesia Quick Evaluation

## 2024-08-31 NOTE — Plan of Care (Signed)

## 2024-08-31 NOTE — Anesthesia Procedure Notes (Signed)
 Anesthesia Regional Block: Adductor canal block   Pre-Anesthetic Checklist: , timeout performed,  Correct Patient, Correct Site, Correct Laterality,  Correct Procedure, Correct Position, site marked,  Risks and benefits discussed,  Pre-op evaluation,  At surgeon's request and post-op pain management  Laterality: Left  Prep: Maximum Sterile Barrier Precautions used, chloraprep       Needles:  Injection technique: Single-shot  Needle Type: Echogenic Stimulator Needle     Needle Length: 9cm  Needle Gauge: 21     Additional Needles:   Procedures:,,,, ultrasound used (permanent image in chart),,    Narrative:  Start time: 08/31/2024 2:00 PM End time: 08/31/2024 2:04 PM Injection made incrementally with aspirations every 5 mL. Anesthesiologist: Niels Marien CROME, MD

## 2024-08-31 NOTE — Progress Notes (Signed)
 PROGRESS NOTE  Ryan [name] FMW:[id number] DOB: 07-12-1966 DOA: 08/16/2024 PCP: [name] [name], NP   LOS: 14 days   Brief Narrative / Interim history: 58 year old M with PMH of DM-2, right BKA in 02/2023 and tobacco use disorder presenting with worsening left foot redness, pain and swelling with purulent drainage for last 2 months and admitted with diabetic foot infection, and L foot osteomyelitis.  He has been following up with [facility], and sent to ED for further evaluation.  Left foot x-ray shows left fifth metatarsal joint changes consistent with osteomyelitis.  Patient was admitted and orthopedics was consulted,  started on empiric antibiotics. Further workup showed MRSA bacteremia.  ID consulted and antibiotic changed to IV daptomycin.  Initially refused left BKA but he later agreed.  Left BKA scheduled for Wednesday.   Subjective / 24h Interval events: No complaints this morning, awaiting surgery  Assesement and Plan: Principal problem Left foot osteomyelitis, diabetic foot infection, MRSA bacteremia -patient has been having worsening left foot wound over the last couple of months with x-ray concerning for osteomyelitis.  Initial blood cultures showed MRSA and ID was consulted.  Repeat blood cultures were negative, TTE and TEE were negative for vegetations.  ID recommends IV daptomycin until 10/24, 2 total weeks following negative blood cultures on 10/10, followed by p.o. doxycycline  100 mg BID through 11/21.  This was the original plan while he was refusing amputation, but now agreeable -Will reach out to ID and see if the plan changes given amputation today  Active problems DM2, uncontrolled, with hyperglycemia -continue glargine and sliding scale  Chronic diastolic CHF-LVEF 60 to 65%, appears euvolemic.  Continue Bumex   COPD-stable, no wheezing  Tobacco use-nicotine patch  Hyponatremia-mild  Scheduled Meds:  bumetanide   1 mg Oral Daily   [START ON 09/03/2024]  doxycycline   100 mg Oral Q12H   enoxaparin (LOVENOX) injection  50 mg Subcutaneous Daily   gabapentin   800 mg Oral TID   insulin  aspart  0-20 Units Subcutaneous TID WC   insulin  aspart  0-5 Units Subcutaneous QHS   insulin  aspart  15 Units Subcutaneous TID WC   insulin  glargine-yfgn  45 Units Subcutaneous BID   irbesartan  300 mg Oral Daily   Continuous Infusions:  DAPTOmycin 700 mg (08/30/24 1327)   PRN Meds:.acetaminophen  **OR** acetaminophen , HYDROmorphone  (DILAUDID ) injection, ibuprofen, melatonin, ondansetron  (ZOFRAN ) IV, oxyCODONE   Current Outpatient Medications  Medication Instructions   albuterol  (VENTOLIN  HFA) 108 (90 Base) MCG/ACT inhaler 2 puffs, Inhalation, Every 4 hours PRN   ascorbic acid  (VITAMIN C ) 500 mg, Oral, 2 times daily   bisacodyl  (DULCOLAX) 5 mg, Oral, Daily PRN   brompheniramine-pseudoephedrine-DM 30-2-10 MG/5ML syrup 10 mLs, Oral, 4 times daily PRN   bumetanide  (BUMEX ) 1 MG tablet Take 2 mg two times daily for 5 days. Then switch to 1 mg two times daily   celecoxib (CELEBREX) 200 mg, Oral, Daily   collagenase  (SANTYL ) 250 UNIT/GM ointment 1 Application, Topical, Daily, Apply to the affected area daily plus dry dressing   docusate sodium  (COLACE) 100 mg, Oral, Daily   [START ON 09/03/2024] doxycycline  (VIBRA -TABS) 100 mg, Oral, 2 times daily, Start taking on 10/25 after IV antibiotics have been completed   famotidine (PEPCID) 20 mg, Oral, Daily   fenofibrate (TRICOR) 48 mg, Oral, Daily   gabapentin  (NEURONTIN ) 800 mg, Oral, 3 times daily   Glucagon Emergency 1 mg, Intramuscular, As needed   hydrALAZINE  (APRESOLINE ) 25 mg, Oral, 3 times daily   Insulin  Aspart FlexPen (NOVOLOG )  10 Units, Subcutaneous, 3 times daily before meals & bedtime, Hold for CBGs < 150 or if patient is not eating.   insulin  glargine-yfgn (SEMGLEE ) 30 Units, Subcutaneous, Every 12 hours, Hold if CBG is < 150   ipratropium-albuterol  (DUONEB) 0.5-2.5 (3) MG/3ML SOLN 3 mLs, Inhalation, Every 4  hours PRN   Jardiance 10 mg, Oral, Daily   melatonin 6 mg, Oral, Daily at bedtime   metFORMIN  (GLUCOPHAGE ) 1,000 mg, Oral, 2 times daily   metoprolol  tartrate (LOPRESSOR ) 12.5 mg, Oral, Daily   naloxone (NARCAN) nasal spray 4 mg/0.1 mL 1 spray, Nasal, Once PRN   oxyCODONE  (ROXICODONE ) 15 mg, Oral, Every 4 hours PRN   pantoprazole  (PROTONIX ) 40 mg, Oral, Daily at bedtime   polyethylene glycol (MIRALAX  / GLYCOLAX ) 17 g, Oral, Daily PRN   rOPINIRole (REQUIP) 1 mg, Oral, 3 times daily   TRELEGY ELLIPTA 100-62.5-25 MCG/ACT AEPB 1 puff, Inhalation, Daily    Diet Orders (From admission, onward)     Start     Ordered   08/31/24 0912  Diet NPO time specified  Diet effective now        08/31/24 0911            DVT prophylaxis: SCDs Start: 08/17/24 0224   Lab Results  Component Value Date   PLT 388 08/31/2024      Code Status: Full Code  Family Communication: No family at bedside  Status is: Inpatient Remains inpatient appropriate because: Severity of illness   Level of care: Med-Surg  Consultants:  ID Orthopedic surgery  Objective: Vitals:   08/30/24 2006 08/31/24 0419 08/31/24 0736 08/31/24 0746  BP: 129/79 (!) 155/92 106/69 106/69  Pulse: 87 83 81 81  Resp: 17 19 16    Temp: 97.9 F (36.6 C) 98.4 F (36.9 C) 98 F (36.7 C)   TempSrc: Oral Oral Oral   SpO2: 97% 96% 98%   Weight:      Height:        Intake/Output Summary (Last 24 hours) at 08/31/2024 1150 Last data filed at 08/31/2024 1100 Gross per 24 hour  Intake --  Output 2600 ml  Net -2600 ml   Wt Readings from Last 3 Encounters:  08/26/24 100.6 kg  05/19/24 107 kg  03/24/24 106.6 kg    Examination:  Constitutional: NAD Eyes: no scleral icterus ENMT: Mucous membranes are moist.  Neck: normal, supple Respiratory: clear to auscultation bilaterally, no wheezing, no crackles.  Cardiovascular: Regular rate and rhythm, no murmurs / rubs / gallops.  Abdomen: non distended, no tenderness. Bowel  sounds positive.  Musculoskeletal: no clubbing / cyanosis.   Data Reviewed: I have independently reviewed following labs and imaging studies   CBC Recent Labs  Lab 08/25/24 0353 08/26/24 0644 08/28/24 0729 08/31/24 0532  WBC 7.0 8.3 9.3 6.9  HGB 12.6* 13.1 12.4* 12.2*  HCT 42.4 43.8 41.9 41.8  PLT 381 410* 381 388  MCV 80.9 80.5 82.5 83.3  MCH 24.0* 24.1* 24.4* 24.3*  MCHC 29.7* 29.9* 29.6* 29.2*  RDW 19.4* 19.7* 19.7* 19.9*    Recent Labs  Lab 08/25/24 0353 08/26/24 0644 08/28/24 0729 08/31/24 0532  NA 132* 134* 134* 133*  K 4.6 4.0 4.7 4.9  CL 98 98 97* 100  CO2 25 26 27 24   GLUCOSE 276* 168* 228* 217*  BUN 25* 23* 30* 31*  CREATININE 1.02 0.99 1.07 1.06  CALCIUM 8.4* 8.8* 8.7* 9.0  ALBUMIN 1.8* 1.9* 2.0* 2.1*  MG 1.8 1.8 1.8 1.9    ------------------------------------------------------------------------------------------------------------------  No results for input(s): CHOL, HDL, LDLCALC, TRIG, CHOLHDL, LDLDIRECT in the last 72 hours.  Lab Results  Component Value Date   HGBA1C 13.2 (H) 08/16/2024   ------------------------------------------------------------------------------------------------------------------ No results for input(s): TSH, T4TOTAL, T3FREE, THYROIDAB in the last 72 hours.  Invalid input(s): FREET3  Cardiac Enzymes No results for input(s): CKMB, TROPONINI, MYOGLOBIN in the last 168 hours.  Invalid input(s): CK ------------------------------------------------------------------------------------------------------------------ No results found for: BNP  CBG: Recent Labs  Lab 08/30/24 0610 08/30/24 1136 08/30/24 1619 08/30/24 2112 08/31/24 0622  GLUCAP 184* 130* 196* 74 215*    Recent Results (from the past 240 hours)  MRSA Next Gen by PCR, Nasal     Status: Abnormal   Collection Time: 08/23/24  8:00 AM   Specimen: Nasal Mucosa; Nasal Swab  Result Value Ref Range Status   MRSA by PCR Next Gen  DETECTED (A) NOT DETECTED Final    Comment: RESULT CALLED TO, READ BACK BY AND VERIFIED WITH: RN EMERSON HALEY [phone] @ 1539 FH (NOTE) The GeneXpert MRSA Assay (FDA approved for NASAL specimens only), is one component of a comprehensive MRSA colonization surveillance program. It is not intended to diagnose MRSA infection nor to guide or monitor treatment for MRSA infections. Test performance is not FDA approved in patients less than 67 years old. Performed at [facility], [address], [place], [place] [address]      Radiology Studies: No results found.   Nilda Fendt, MD, PhD [facility]  Between 7 am - 7 pm I am available, please contact me via Amion (for emergencies) or Securechat (non urgent messages)  Between 7 pm - 7 am I am not available, please contact night coverage MD/APP via Amion

## 2024-08-31 NOTE — Interval H&P Note (Signed)
 History and Physical Interval Note:  08/31/2024 6:39 AM  Ryan [name]  has presented today for surgery, with the diagnosis of Gangrene Left Foot.  The various methods of treatment have been discussed with the patient and family. After consideration of risks, benefits and other options for treatment, the patient has consented to  Procedure(s): AMPUTATION BELOW KNEE (Left) as a surgical intervention.  The patient's history has been reviewed, patient examined, no change in status, stable for surgery.  I have reviewed the patient's chart and labs.  Questions were answered to the patient's satisfaction.     Lelah Rennaker V Norvell Caswell

## 2024-08-31 NOTE — Anesthesia Procedure Notes (Signed)
 Anesthesia Regional Block: Popliteal block   Pre-Anesthetic Checklist: , timeout performed,  Correct Patient, Correct Site, Correct Laterality,  Correct Procedure, Correct Position, site marked,  Risks and benefits discussed,  Pre-op evaluation,  At surgeon's request and post-op pain management  Laterality: Left  Prep: Maximum Sterile Barrier Precautions used, chloraprep       Needles:  Injection technique: Single-shot  Needle Type: Echogenic Stimulator Needle     Needle Length: 9cm  Needle Gauge: 21     Additional Needles:   Procedures:,,,, ultrasound used (permanent image in chart),,    Narrative:  Start time: 08/31/2024 1:55 PM End time: 08/31/2024 2:00 PM Injection made incrementally with aspirations every 5 mL. Anesthesiologist: Niels Marien CROME, MD

## 2024-08-31 NOTE — Anesthesia Procedure Notes (Signed)
 Procedure Name: LMA Insertion Date/Time: 08/31/2024 3:03 PM  Performed by: Arvell Edsel HERO, CRNAPre-anesthesia Checklist: Patient identified, Emergency Drugs available, Suction available, Patient being monitored and Timeout performed Patient Re-evaluated:Patient Re-evaluated prior to induction Oxygen Delivery Method: Circle system utilized Preoxygenation: Pre-oxygenation with 100% oxygen Induction Type: IV induction LMA: LMA inserted LMA Size: 4.0 Placement Confirmation: positive ETCO2 and breath sounds checked- equal and bilateral Tube secured with: Tape

## 2024-08-31 NOTE — Transfer of Care (Signed)
 Immediate Anesthesia Transfer of Care Note  Patient: Ryan [name]  Procedure(s) Performed: AMPUTATION BELOW KNEE LEFT (Left: Knee)  Patient Location: PACU  Anesthesia Type:General  Level of Consciousness: drowsy and patient cooperative  Airway & Oxygen Therapy: Patient Spontanous Breathing and Patient connected to face mask oxygen  Post-op Assessment: Report given to RN, Post -op Vital signs reviewed and stable, and Patient moving all extremities  Post vital signs: Reviewed and stable  Last Vitals:  Vitals Value Taken Time  BP 127/70 08/31/24 16:25  Temp 36.7 C 08/31/24 16:25  Pulse 95 08/31/24 16:25  Resp 16 08/31/24 16:25  SpO2 93 % 08/31/24 16:25  Vitals shown include unfiled device data.  Last Pain:  Vitals:   08/31/24 1625  TempSrc:   PainSc: 0-No pain      Patients Stated Pain Goal: 0 (08/29/24 0200)  Complications: No notable events documented.

## 2024-08-31 NOTE — Progress Notes (Signed)
 Patient's dressing bleeding profusely.  Blood clotting under negative pressure dressing.  Wound vac not functioning properly.  This RN and the primary RN reinforced dressing.  [facility] Answering [facility] paged via Amion.  Awaiting new orders.

## 2024-09-01 ENCOUNTER — Encounter (HOSPITAL_COMMUNITY): Payer: Self-pay | Admitting: Orthopedic Surgery

## 2024-09-01 ENCOUNTER — Other Ambulatory Visit (HOSPITAL_COMMUNITY): Payer: Self-pay

## 2024-09-01 DIAGNOSIS — L03116 Cellulitis of left lower limb: Secondary | ICD-10-CM | POA: Diagnosis not present

## 2024-09-01 LAB — CBC
HCT: 33.2 % — ABNORMAL LOW (ref 39.0–52.0)
Hemoglobin: 9.9 g/dL — ABNORMAL LOW (ref 13.0–17.0)
MCH: 24.5 pg — ABNORMAL LOW (ref 26.0–34.0)
MCHC: 29.8 g/dL — ABNORMAL LOW (ref 30.0–36.0)
MCV: 82.2 fL (ref 80.0–100.0)
Platelets: 353 K/uL (ref 150–400)
RBC: 4.04 MIL/uL — ABNORMAL LOW (ref 4.22–5.81)
RDW: 19.4 % — ABNORMAL HIGH (ref 11.5–15.5)
WBC: 8.2 K/uL (ref 4.0–10.5)
nRBC: 0 % (ref 0.0–0.2)

## 2024-09-01 LAB — COMPREHENSIVE METABOLIC PANEL WITH GFR
ALT: 32 U/L (ref 0–44)
AST: 38 U/L (ref 15–41)
Albumin: 1.9 g/dL — ABNORMAL LOW (ref 3.5–5.0)
Alkaline Phosphatase: 130 U/L — ABNORMAL HIGH (ref 38–126)
Anion gap: 8 (ref 5–15)
BUN: 34 mg/dL — ABNORMAL HIGH (ref 6–20)
CO2: 26 mmol/L (ref 22–32)
Calcium: 8.6 mg/dL — ABNORMAL LOW (ref 8.9–10.3)
Chloride: 98 mmol/L (ref 98–111)
Creatinine, Ser: 1.23 mg/dL (ref 0.61–1.24)
GFR, Estimated: >60 mL/min (ref >60)
Glucose, Bld: 369 mg/dL — ABNORMAL HIGH (ref 70–99)
Potassium: 5.5 mmol/L — ABNORMAL HIGH (ref 3.5–5.1)
Sodium: 132 mmol/L — ABNORMAL LOW (ref 135–145)
Total Bilirubin: 0.7 mg/dL (ref 0.0–1.2)
Total Protein: 7.1 g/dL (ref 6.5–8.1)

## 2024-09-01 LAB — GLUCOSE, CAPILLARY
Glucose-Capillary: 286 mg/dL — ABNORMAL HIGH (ref 70–99)
Glucose-Capillary: 333 mg/dL — ABNORMAL HIGH (ref 70–99)
Glucose-Capillary: 396 mg/dL — ABNORMAL HIGH (ref 70–99)
Glucose-Capillary: 228 mg/dL — ABNORMAL HIGH (ref 70–99)
Glucose-Capillary: 149 mg/dL — ABNORMAL HIGH (ref 70–99)

## 2024-09-01 LAB — MAGNESIUM: Magnesium: 1.8 mg/dL (ref 1.7–2.4)

## 2024-09-01 MED ORDER — DAPTOMYCIN-SODIUM CHLORIDE 700-0.9 MG/100ML-% IV SOLN
8.0000 mg/kg | Freq: Every day | INTRAVENOUS | Status: AC
  Administered 2024-09-02: 700 mg via INTRAVENOUS
  Filled 2024-09-01: qty 100

## 2024-09-01 MED ORDER — SODIUM ZIRCONIUM CYCLOSILICATE 10 G PO PACK
10.0000 g | PACK | Freq: Once | ORAL | Status: AC
  Administered 2024-09-01: 10 g via ORAL
  Filled 2024-09-01: qty 1

## 2024-09-01 NOTE — Progress Notes (Signed)
 Patient ID: Ryan [name], male   DOB: [birth year], 58 y.o.   MRN: [id number] Thank  Patient is postoperative day 1 left transtibial amputation.  The VAC was removed yesterday secondary to leakage from his anticoagulation therapy.  The anticoagulation therapy was stopped and wound VAC dressing was removed and dry dressing applied.  Plan for discharge to skilled nursing.

## 2024-09-01 NOTE — Anesthesia Postprocedure Evaluation (Signed)
 Anesthesia Post Note  Patient: Ryan [name]  Procedure(s) Performed: AMPUTATION BELOW KNEE LEFT (Left: Knee)     Patient location during evaluation: PACU Anesthesia Type: Regional and General Level of consciousness: awake and alert Pain management: pain level controlled Vital Signs Assessment: post-procedure vital signs reviewed and stable Respiratory status: spontaneous breathing, nonlabored ventilation, respiratory function stable and patient connected to nasal cannula oxygen Cardiovascular status: blood pressure returned to baseline and stable Postop Assessment: no apparent nausea or vomiting Anesthetic complications: no   No notable events documented.  Last Vitals:  Vitals:   09/01/24 0811 09/01/24 1521  BP: (!) 107/47 120/69  Pulse: 97 (!) 107  Resp: 18   Temp: 36.6 C 36.7 C  SpO2: 91% 97%    Last Pain:  Vitals:   09/01/24 1739  TempSrc:   PainSc: 7                  Glendy Barsanti L Saia Derossett

## 2024-09-01 NOTE — Inpatient Diabetes Management (Signed)
 Inpatient Diabetes Program Recommendations  AACE/ADA: New Consensus Statement on Inpatient Glycemic Control (2015)  Target Ranges:  Prepandial:   less than 140 mg/dL      Peak postprandial:   less than 180 mg/dL (1-2 hours)      Critically ill patients:  140 - 180 mg/dL   Lab Results  Component Value Date   GLUCAP 333 (H) 09/01/2024   HGBA1C 13.2 (H) 08/16/2024    Review of Glycemic Control  Latest Reference Range & Units 08/31/24 15:57 08/31/24 16:50 08/31/24 21:32 09/01/24 06:14 09/01/24 08:40  Glucose-Capillary 70 - 99 mg/dL 95 885 (H) 548 (H) 713 (H) 333 (H)   Diabetes history: DM  Outpatient Diabetes medications:  Novolog  10 units tid with meals Semglee  30 units bid Jardiance 10 mg daily Metformin  1000 mg bid Current orders for Inpatient glycemic control:  Novolog  0-20 units tid with meals and HS Semglee  45 units bid Novolog  15 units tid with meals   Inpatient Diabetes Program Recommendations:    Note patient did receive Decadron  10 mg x 1 which increased CBG's overnight.  CBG's should improve today.   Thanks,  Randall Bullocks, RN, BC-ADM Inpatient Diabetes Coordinator Pager [phone]  (8a-5p)

## 2024-09-01 NOTE — Plan of Care (Signed)
  Problem: Education: Goal: Ability to describe self-care measures that may prevent or decrease complications (Diabetes Survival Skills Education) will improve Outcome: Progressing Goal: Individualized Educational Video(s) Outcome: Progressing   Problem: Coping: Goal: Ability to adjust to condition or change in health will improve Outcome: Progressing   Problem: Fluid Volume: Goal: Ability to maintain a balanced intake and output will improve Outcome: Progressing   Problem: Health Behavior/Discharge Planning: Goal: Ability to identify and utilize available resources and services will improve Outcome: Progressing Goal: Ability to manage health-related needs will improve Outcome: Progressing   Problem: Metabolic: Goal: Ability to maintain appropriate glucose levels will improve Outcome: Progressing   Problem: Nutritional: Goal: Maintenance of adequate nutrition will improve Outcome: Progressing Goal: Progress toward achieving an optimal weight will improve Outcome: Progressing   Problem: Skin Integrity: Goal: Risk for impaired skin integrity will decrease Outcome: Progressing   Problem: Tissue Perfusion: Goal: Adequacy of tissue perfusion will improve Outcome: Progressing   Problem: Education: Goal: Knowledge of General Education information will improve Description: Including pain rating scale, medication(s)/side effects and non-pharmacologic comfort measures Outcome: Progressing   Problem: Health Behavior/Discharge Planning: Goal: Ability to manage health-related needs will improve Outcome: Progressing   Problem: Clinical Measurements: Goal: Ability to maintain clinical measurements within normal limits will improve Outcome: Progressing Goal: Will remain free from infection Outcome: Progressing Goal: Diagnostic test results will improve Outcome: Progressing Goal: Respiratory complications will improve Outcome: Progressing Goal: Cardiovascular complication will  be avoided Outcome: Progressing   Problem: Activity: Goal: Risk for activity intolerance will decrease Outcome: Progressing   Problem: Nutrition: Goal: Adequate nutrition will be maintained Outcome: Progressing   Problem: Coping: Goal: Level of anxiety will decrease Outcome: Progressing   Problem: Elimination: Goal: Will not experience complications related to bowel motility Outcome: Progressing Goal: Will not experience complications related to urinary retention Outcome: Progressing   Problem: Pain Managment: Goal: General experience of comfort will improve and/or be controlled Outcome: Progressing   Problem: Safety: Goal: Ability to remain free from injury will improve Outcome: Progressing   Problem: Skin Integrity: Goal: Risk for impaired skin integrity will decrease Outcome: Progressing   Problem: Education: Goal: Knowledge of the prescribed therapeutic regimen will improve Outcome: Progressing Goal: Ability to verbalize activity precautions or restrictions will improve Outcome: Progressing Goal: Understanding of discharge needs will improve Outcome: Progressing   Problem: Activity: Goal: Ability to perform//tolerate increased activity and mobilize with assistive devices will improve Outcome: Progressing   Problem: Clinical Measurements: Goal: Postoperative complications will be avoided or minimized Outcome: Progressing   Problem: Self-Care: Goal: Ability to meet self-care needs will improve Outcome: Progressing   Problem: Self-Concept: Goal: Ability to maintain and perform role responsibilities to the fullest extent possible will improve Outcome: Progressing   Problem: Pain Management: Goal: Pain level will decrease with appropriate interventions Outcome: Progressing   Problem: Skin Integrity: Goal: Demonstration of wound healing without infection will improve Outcome: Progressing

## 2024-09-01 NOTE — Evaluation (Signed)
 Occupational Therapy Evaluation Patient Details Name: Ryan [name] MRN: [id number] DOB: 1966-02-06 Today's Date: 09/01/2024   History of Present Illness   Pt is a 58 y.o male admitted 08/16/24 for hyperglycemia and diabetic foot infection. Left foot x-ray shows fifth metatarsal joint changes consistent with osteomyelitis. MRI demonstrated osteomyelitis/septic arthritis of the underlying left fifth proximal phalanx and mid to distal fifth metatarsal. Blood cultures with MRSA. Repeat blood culture negative.  TTE and TEE negative for vegetation. Pt s/p L BKA and wound vac application 10/22.  PMHx: R BKA, uncontrolled T2DM, chronic pain syndrome, CHF, COPD, PVD, and tobacco use.     Clinical Impressions Pt seen this session for re-evaluation following L BKA. Pt engaged in bed mobility with supervision and continues to require up to Mod A ADL engagement. Pt declining OOB transfers this session, will benefit from continued education on safe wheelchair and BSC transfers following change in functional status. Pt educated on limb desensitization and importance of limb protector. OT to continue to follow Pt acutely to facilitate progress towards updated functional goals. Recommendation of continued inpatient follow up therapy, <3 hours/day remains appropriate.        If plan is discharge home, recommend the following:   A lot of help with walking and/or transfers;A little help with bathing/dressing/bathroom;Assistance with cooking/housework;Assist for transportation;Help with stairs or ramp for entrance     Functional Status Assessment   Patient has had a recent decline in their functional status and demonstrates the ability to make significant improvements in function in a reasonable and predictable amount of time.     Equipment Recommendations   Other (comment);Wheelchair (measurements OT);BSC/3in1;Tub/shower seat (drop arm BSC)     Recommendations for Other Services          Precautions/Restrictions   Precautions Precautions: Fall Recall of Precautions/Restrictions: Intact Required Braces or Orthoses: Other Brace Other Brace: LLE Limb Guard (Pt declined to wear) Restrictions Weight Bearing Restrictions Per Provider Order: Yes LLE Weight Bearing Per Provider Order: Non weight bearing     Mobility Bed Mobility Overal bed mobility: Needs Assistance Bed Mobility: Sit to Supine, Supine to Sit     Supine to sit: Supervision, Used rails Sit to supine: Supervision, Used rails   General bed mobility comments: Pt required supervision for bed mobility. Pt endorsed only laying on his L side, declined attempt to roll towards R.    Transfers                   General transfer comment: Pt declining OOB activity this date. Pt demonstrated the ability to lateral scoot towards HOB. Pt with good BUE strength for lateral scoot and A/P transfers.      Balance Overall balance assessment: Needs assistance Sitting-balance support: Single extremity supported, Feet supported Sitting balance-Leahy Scale: Fair Sitting balance - Comments: Pt sat EOB with supervision. Able to reach slightly anterior down legs. Pt with decreased dynamic sitting abilities to reach outside BOS.                                   ADL either performed or assessed with clinical judgement   ADL Overall ADL's : Needs assistance/impaired Eating/Feeding: Independent   Grooming: Independent;Sitting   Upper Body Bathing: Independent;Sitting   Lower Body Bathing: Moderate assistance;Sitting/lateral leans   Upper Body Dressing : Independent;Sitting   Lower Body Dressing: Moderate assistance;Sitting/lateral leans  General ADL Comments: Pt using bedside urinal, declining toileting transfer this date. Pt limited by pain     Vision Patient Visual Report: No change from baseline Vision Assessment?: No apparent visual deficits     Perception          Praxis         Pertinent Vitals/Pain Pain Assessment Pain Assessment: 0-10 Pain Score: 8  Pain Location: lower back, neck, and L residual limb. Pain Descriptors / Indicators: Operative site guarding, Discomfort, Aching, Sore Pain Intervention(s): Premedicated before session, Monitored during session, Limited activity within patient's tolerance     Extremity/Trunk Assessment Upper Extremity Assessment Upper Extremity Assessment: Overall WFL for tasks assessed   Lower Extremity Assessment Lower Extremity Assessment: Defer to PT evaluation RLE Deficits / Details: Hx BKA RLE Sensation: WNL RLE Coordination: WNL LLE Deficits / Details: Pt POD 1 s/p BKA. LLE Sensation: decreased proprioception LLE Coordination: decreased gross motor   Cervical / Trunk Assessment Cervical / Trunk Assessment: Other exceptions Cervical / Trunk Exceptions: Increased Body Habitus; Thoracic Rounding   Communication Communication Communication: No apparent difficulties   Cognition Arousal: Alert Behavior During Therapy: WFL for tasks assessed/performed Cognition: No apparent impairments             OT - Cognition Comments: Pt with decreased motivation to engage in ADL tasks                 Following commands: Intact       Cueing  General Comments   Cueing Techniques: Verbal cues  Pt educated on densitiization and BLE HEP. Pt educated on importance of limb protector but declining use.   Exercises Exercises: Other exercises, Amputee Amputee Exercises Knee Extension: Seated, Both, AROM, 10 reps Other Exercises Other Exercises: Educated pt on L BKA HEP. Provided handout (Medbridge Access Code: [email or web address])   Shoulder Instructions      Home Living Family/patient expects to be discharged to:: Private residence Living Arrangements: Non-relatives/Friends Available Help at Discharge: Friend(s);Available PRN/intermittently Type of Home: House Home Access: Ramped entrance      Home Layout: One level     Bathroom Shower/Tub: Producer, television/film/video: Standard Bathroom Accessibility: Yes   Home Equipment: None   Additional Comments: Pt is borrowing wheelchair. Pt reported that he is trying to get a drop arm BSC for home because he does not fit on his toilet. Unsure of current home bathroom set-up.      Prior Functioning/Environment Prior Level of Function : Independent/Modified Independent             Mobility Comments: Mobilizes using w/c d/t L foot pain. Transfers via stand pivot. Denies fall hx. ADLs Comments: Indep with ADLs/IADLs.    OT Problem List: Decreased activity tolerance;Impaired balance (sitting and/or standing);Decreased safety awareness;Decreased knowledge of use of DME or AE;Pain   OT Treatment/Interventions: Self-care/ADL training;Therapeutic exercise;Energy conservation;DME and/or AE instruction;Therapeutic activities;Patient/family education;Balance training      OT Goals(Current goals can be found in the care plan section)   Acute Rehab OT Goals Patient Stated Goal: Decreased pain OT Goal Formulation: With patient Time For Goal Achievement: 09/15/24 Potential to Achieve Goals: Good ADL Goals Pt Will Perform Grooming: Independently;sitting (reaching outside base of support to gather supplies) Pt Will Transfer to Toilet: with contact guard assist;bedside commode;with transfer board Pt Will Perform Tub/Shower Transfer: Shower transfer;3 in 1;with transfer board;with contact guard assist   OT Frequency:  Min 2X/week    Co-evaluation PT/OT/SLP Co-Evaluation/Treatment: Yes Reason for Co-Treatment: For  patient/therapist safety;To address functional/ADL transfers;Necessary to address cognition/behavior during functional activity   OT goals addressed during session: ADL's and self-care;Strengthening/ROM;Proper use of Adaptive equipment and DME      AM-PAC OT 6 Clicks Daily Activity     Outcome Measure Help from  another person eating meals?: None Help from another person taking care of personal grooming?: None Help from another person toileting, which includes using toliet, bedpan, or urinal?: A Lot Help from another person bathing (including washing, rinsing, drying)?: A Lot Help from another person to put on and taking off regular upper body clothing?: None Help from another person to put on and taking off regular lower body clothing?: A Lot 6 Click Score: 18   End of Session Nurse Communication: Mobility status;Patient requests pain meds  Activity Tolerance: Patient limited by pain Patient left: in bed;with call bell/phone within reach;with bed alarm set  OT Visit Diagnosis: Other abnormalities of gait and mobility (R26.89);Muscle weakness (generalized) (M62.81);Pain Pain - Right/Left: Left Pain - part of body: Leg                Time: 1020-1047 OT Time Calculation (min): 27 min Charges:  OT General Charges $OT Visit: 1 Visit OT Evaluation $OT Re-eval: 1 Re-eval  Maurilio CROME, OTR/L.  [facility]  Office: [phone]   Maurilio PARAS Adael Culbreath 09/01/2024, 11:38 AM

## 2024-09-01 NOTE — Progress Notes (Signed)
 Wound Vac was removed by DR Harden orders. It had been saturated with blood. 4x4 applied, ABD, Kerlix and ace wrap. Remains intact will continue to monitor.

## 2024-09-01 NOTE — TOC Progression Note (Signed)
 Transition of Care [facility]) - Progression Note    Patient Details  Name: Ryan [name] MRN: [id number] Date of Birth: [birth year]  [facility] CM/SW Contact  Bridget Cordella Simmonds, LCSW Phone Number: 09/01/2024, 12:18 PM  Clinical Narrative:   CSW spoke with pt regarding DC plan, recommendation for SNF.  Pt states he was at SNF with his prior surgery and left early, did not find it helpful.  He does not want to DC to SNF now either.  CSW again inquired about what assistance he will have at home, pt somewhat vague, says there are lots of people but upon a few more questions states there are several teenage boys and also several adults, including one who is a CNA, all who would be available to assist.  He does want HHPT.  Team updated.     Expected Discharge Plan: Home w Home Health Services Barriers to Discharge: Continued Medical Work up               Expected Discharge Plan and Services In-house Referral: Clinical Social Work Discharge Planning Services: CM Consult Post Acute Care Choice:  (TBD) Living arrangements for the past 2 months: Single Family Home                           HH Arranged: IV Antibiotics, RN HH Agency: [facility] Date HH Agency Contacted: 08/25/24   Representative spoke with at [facility] Agency: Pam   Social Drivers of Health (SDOH) Interventions SDOH Screenings   Food Insecurity: Food Insecurity Present (08/21/2024)  Housing: Low Risk  (08/21/2024)  Transportation Needs: No Transportation Needs (08/21/2024)  Utilities: Not At Risk (08/21/2024)  Financial Resource Strain: High Risk (01/24/2021)   Received from [facility] visits prior to 01/10/2023.  Social Connections: Unknown (08/21/2024)  Tobacco Use: High Risk (08/31/2024)    Readmission Risk Interventions     No data to display

## 2024-09-01 NOTE — Progress Notes (Signed)
 PROGRESS NOTE  Ryan [name] FMW:[id number] DOB: July 20, 1966 DOA: 08/16/2024 PCP: [name] [name], NP   LOS: 15 days   Brief Narrative / Interim history: 58 year old M with PMH of DM-2, right BKA in 02/2023 and tobacco use disorder presenting with worsening left foot redness, pain and swelling with purulent drainage for last 2 months and admitted with diabetic foot infection, and L foot osteomyelitis.  He has been following up with [facility], and sent to ED for further evaluation.  Left foot x-ray shows left fifth metatarsal joint changes consistent with osteomyelitis.  Patient was admitted and orthopedics was consulted,  started on empiric antibiotics. Further workup showed MRSA bacteremia.  ID consulted and antibiotic changed to IV daptomycin.  Initially refused left BKA but he later agreed.  Left BKA scheduled for Wednesday.   Subjective / 24h Interval events: No complaints this morning, awaiting surgery  Assesement and Plan: Principal problem Left foot osteomyelitis, diabetic foot infection, MRSA bacteremia -patient has been having worsening left foot wound over the last couple of months with x-ray concerning for osteomyelitis.  Initial blood cultures showed MRSA and ID was consulted.  Repeat blood cultures were negative, TTE and TEE were negative for vegetations.  ID recommends IV daptomycin until 10/24, 2 total weeks following negative blood cultures on 10/10, followed by p.o. doxycycline  100 mg BID through 11/21.  This was the original plan while he was refusing amputation.  Case discussed again with ID, Dr. Fleeta Rothman, as well as orthopedic surgery Dr. Harden this morning.  Given that he underwent amputation and obtained source control, he will not need further antibiotics postoperatively and will discontinue daptomycin tomorrow after 2 weeks of total antibiotics  Active problems DM2, uncontrolled, with hyperglycemia -continue glargine and sliding scale  Chronic diastolic CHF-LVEF  60 to 65%, appears euvolemic.  Continue Bumex   COPD-stable, no wheezing  Hyperkalemia-Lokelma x 1  Tobacco use-nicotine patch  Hyponatremia-mild  Scheduled Meds:  bumetanide   1 mg Oral Daily   Chlorhexidine  Gluconate Cloth  6 each Topical Daily   docusate sodium   100 mg Oral Daily   [START ON 09/03/2024] doxycycline   100 mg Oral Q12H   gabapentin   800 mg Oral TID   insulin  aspart  0-20 Units Subcutaneous TID WC   insulin  aspart  0-5 Units Subcutaneous QHS   insulin  aspart  15 Units Subcutaneous TID WC   insulin  glargine-yfgn  45 Units Subcutaneous BID   irbesartan  300 mg Oral Daily   mupirocin ointment  1 Application Nasal BID   Continuous Infusions:  DAPTOmycin 700 mg (09/01/24 0936)   PRN Meds:.acetaminophen  **OR** acetaminophen , hydrALAZINE , HYDROmorphone  (DILAUDID ) injection, ibuprofen, labetalol , melatonin, metoprolol  tartrate, ondansetron  (ZOFRAN ) IV, oxyCODONE , phenol, potassium chloride   Current Outpatient Medications  Medication Instructions   albuterol  (VENTOLIN  HFA) 108 (90 Base) MCG/ACT inhaler 2 puffs, Inhalation, Every 4 hours PRN   ascorbic acid  (VITAMIN C ) 500 mg, Oral, 2 times daily   bisacodyl  (DULCOLAX) 5 mg, Oral, Daily PRN   brompheniramine-pseudoephedrine-DM 30-2-10 MG/5ML syrup 10 mLs, Oral, 4 times daily PRN   bumetanide  (BUMEX ) 1 MG tablet Take 2 mg two times daily for 5 days. Then switch to 1 mg two times daily   celecoxib (CELEBREX) 200 mg, Oral, Daily   collagenase  (SANTYL ) 250 UNIT/GM ointment 1 Application, Topical, Daily, Apply to the affected area daily plus dry dressing   docusate sodium  (COLACE) 100 mg, Oral, Daily   [START ON 09/03/2024] doxycycline  (VIBRA -TABS) 100 mg, Oral, 2 times daily, Start taking on  10/25 after IV antibiotics have been completed   famotidine (PEPCID) 20 mg, Oral, Daily   fenofibrate (TRICOR) 48 mg, Oral, Daily   gabapentin  (NEURONTIN ) 800 mg, Oral, 3 times daily   Glucagon Emergency 1 mg, Intramuscular, As needed    hydrALAZINE  (APRESOLINE ) 25 mg, Oral, 3 times daily   Insulin  Aspart FlexPen (NOVOLOG ) 10 Units, Subcutaneous, 3 times daily before meals & bedtime, Hold for CBGs < 150 or if patient is not eating.   insulin  glargine-yfgn (SEMGLEE ) 30 Units, Subcutaneous, Every 12 hours, Hold if CBG is < 150   ipratropium-albuterol  (DUONEB) 0.5-2.5 (3) MG/3ML SOLN 3 mLs, Inhalation, Every 4 hours PRN   Jardiance 10 mg, Oral, Daily   melatonin 6 mg, Oral, Daily at bedtime   metFORMIN  (GLUCOPHAGE ) 1,000 mg, Oral, 2 times daily   metoprolol  tartrate (LOPRESSOR ) 12.5 mg, Oral, Daily   naloxone (NARCAN) nasal spray 4 mg/0.1 mL 1 spray, Nasal, Once PRN   oxyCODONE  (ROXICODONE ) 15 mg, Oral, Every 4 hours PRN   pantoprazole  (PROTONIX ) 40 mg, Oral, Daily at bedtime   polyethylene glycol (MIRALAX  / GLYCOLAX ) 17 g, Oral, Daily PRN   rOPINIRole (REQUIP) 1 mg, Oral, 3 times daily   TRELEGY ELLIPTA 100-62.5-25 MCG/ACT AEPB 1 puff, Inhalation, Daily    Diet Orders (From admission, onward)     Start     Ordered   08/31/24 1638  Diet Carb Modified Fluid consistency: Thin; Room service appropriate? Yes  Diet effective now       Question Answer Comment  Calorie Level Medium 1600-2000   Fluid consistency: Thin   Room service appropriate? Yes      08/31/24 1637            DVT prophylaxis: SCD's Start: 08/31/24 1638 SCDs Start: 08/17/24 0224   Lab Results  Component Value Date   PLT 353 09/01/2024      Code Status: Full Code  Family Communication: No family at bedside  Status is: Inpatient Remains inpatient appropriate because: Severity of illness   Level of care: Med-Surg  Consultants:  ID Orthopedic surgery  Objective: Vitals:   08/31/24 2230 08/31/24 2318 09/01/24 0417 09/01/24 0811  BP: (!) 107/58 106/65 126/83 (!) 107/47  Pulse: (!) 116 (!) 118 93 97  Resp:   15 18  Temp:   97.8 F (36.6 C) 97.8 F (36.6 C)  TempSrc:   Oral Oral  SpO2: 92% 92% 95% 91%  Weight:      Height:         Intake/Output Summary (Last 24 hours) at 09/01/2024 0950 Last data filed at 09/01/2024 0400 Gross per 24 hour  Intake 850 ml  Output 2950 ml  Net -2100 ml   Wt Readings from Last 3 Encounters:  08/26/24 100.6 kg  05/19/24 107 kg  03/24/24 106.6 kg    Examination:  Constitutional: NAD Eyes: lids and conjunctivae normal, no scleral icterus ENMT: mmm Neck: normal, supple Respiratory: clear to auscultation bilaterally, no wheezing, no crackles. Cardiovascular: Regular rate and rhythm, no murmurs / rubs / gallops. No LE edema. Abdomen: soft, no distention, no tenderness. Bowel sounds positive.   Data Reviewed: I have independently reviewed following labs and imaging studies   CBC Recent Labs  Lab 08/26/24 0644 08/28/24 0729 08/31/24 0532 09/01/24 0339  WBC 8.3 9.3 6.9 8.2  HGB 13.1 12.4* 12.2* 9.9*  HCT 43.8 41.9 41.8 33.2*  PLT 410* 381 388 353  MCV 80.5 82.5 83.3 82.2  MCH 24.1* 24.4* 24.3* 24.5*  MCHC  29.9* 29.6* 29.2* 29.8*  RDW 19.7* 19.7* 19.9* 19.4*    Recent Labs  Lab 08/26/24 0644 08/28/24 0729 08/31/24 0532 09/01/24 0339  NA 134* 134* 133* 132*  K 4.0 4.7 4.9 5.5*  CL 98 97* 100 98  CO2 26 27 24 26   GLUCOSE 168* 228* 217* 369*  BUN 23* 30* 31* 34*  CREATININE 0.99 1.07 1.06 1.23  CALCIUM 8.8* 8.7* 9.0 8.6*  AST  --   --   --  38  ALT  --   --   --  32  ALKPHOS  --   --   --  130*  BILITOT  --   --   --  0.7  ALBUMIN 1.9* 2.0* 2.1* 1.9*  MG 1.8 1.8 1.9 1.8    ------------------------------------------------------------------------------------------------------------------ No results for input(s): CHOL, HDL, LDLCALC, TRIG, CHOLHDL, LDLDIRECT in the last 72 hours.  Lab Results  Component Value Date   HGBA1C 13.2 (H) 08/16/2024   ------------------------------------------------------------------------------------------------------------------ No results for input(s): TSH, T4TOTAL, T3FREE, THYROIDAB in the last 72  hours.  Invalid input(s): FREET3  Cardiac Enzymes No results for input(s): CKMB, TROPONINI, MYOGLOBIN in the last 168 hours.  Invalid input(s): CK ------------------------------------------------------------------------------------------------------------------ No results found for: BNP  CBG: Recent Labs  Lab 08/31/24 1557 08/31/24 1650 08/31/24 2132 09/01/24 0614 09/01/24 0840  GLUCAP 95 114* 451* 286* 333*    Recent Results (from the past 240 hours)  MRSA Next Gen by PCR, Nasal     Status: Abnormal   Collection Time: 08/23/24  8:00 AM   Specimen: Nasal Mucosa; Nasal Swab  Result Value Ref Range Status   MRSA by PCR Next Gen DETECTED (A) NOT DETECTED Final    Comment: RESULT CALLED TO, READ BACK BY AND VERIFIED WITH: RN EMERSON HALEY [phone] @ 1539 FH (NOTE) The GeneXpert MRSA Assay (FDA approved for NASAL specimens only), is one component of a comprehensive MRSA colonization surveillance program. It is not intended to diagnose MRSA infection nor to guide or monitor treatment for MRSA infections. Test performance is not FDA approved in patients less than 61 years old. Performed at [facility], [address] [place], [place] [address]      Radiology Studies: No results found.   Nilda Fendt, MD, PhD [facility]  Between 7 am - 7 pm I am available, please contact me via Amion (for emergencies) or Securechat (non urgent messages)  Between 7 pm - 7 am I am not available, please contact night coverage MD/APP via Amion

## 2024-09-01 NOTE — Evaluation (Signed)
 Physical Therapy Re-Evaluation Patient Details Name: Ryan [name] MRN: [id number] DOB: Sep 17, 1966 Today's Date: 09/01/2024  History of Present Illness  Pt is a 58 y.o male admitted 08/16/24 for hyperglycemia and diabetic foot infection. Left foot x-ray shows fifth metatarsal joint changes consistent with osteomyelitis. MRI demonstrated osteomyelitis/septic arthritis of the underlying left fifth proximal phalanx and mid to distal fifth metatarsal. Blood cultures with MRSA. Repeat blood culture negative.  TTE and TEE negative for vegetation. Pt s/p L BKA and wound vac application 10/22.  PMHx: R BKA, uncontrolled T2DM, chronic pain syndrome, CHF, COPD, PVD, and tobacco use.   Clinical Impression  Pt seen for re-evaluation following LLE transtibial amputation. PTA, pt was modI for functional mobility using a manual w/c, which he reports he's borrowing. Pt lives with friends in a one story house with a ramped entrance. He reports intermittent support from friends. Pt currently with functional limitations due to the deficits listed below (see PT Problem List). He performed bed mobility with supervision. Pt declined OOB transfer. He scooted laterally along EOB with BUE support and supervision. Educated pt on L BKA HEP, desensitization, positioning considerations, and importance of wearing limb protector. Pt is primarily limited by pain. Pt will benefit from acute skilled PT to increase his independence and safety with mobility to allow discharge. Recommend continued inpatient follow up therapy, <3 hours/day. Per social work pt is declining SNF recommend increased support from friends and HHPT.      If plan is discharge home, recommend the following: A little help with walking and/or transfers;A little help with bathing/dressing/bathroom;Assistance with cooking/housework;Assist for transportation;Help with stairs or ramp for entrance   Can travel by private vehicle   No    Equipment Recommendations  Wheelchair (measurements PT);Wheelchair cushion (measurements PT);BSC/3in1 (removable arm rests; drop arm BSC)  Recommendations for Other Services       Functional Status Assessment Patient has had a recent decline in their functional status and demonstrates the ability to make significant improvements in function in a reasonable and predictable amount of time.     Precautions / Restrictions Precautions Precautions: Fall Recall of Precautions/Restrictions: Intact Required Braces or Orthoses: Other Brace Other Brace: LLE Limb Guard (Pt declined to wear) Restrictions Weight Bearing Restrictions Per Provider Order: Yes LLE Weight Bearing Per Provider Order: Non weight bearing      Mobility  Bed Mobility Overal bed mobility: Needs Assistance Bed Mobility: Sit to Supine, Supine to Sit     Supine to sit: Supervision, Used rails Sit to supine: Supervision, Used rails   General bed mobility comments: Pt required supervision for bed mobility. Pt endorsed only laying on his L side, declined attempt to roll towards R. He completed supine<>sit with HOB slightly elevated and heavy reliance on bed rail.    Transfers                   General transfer comment: Pt declining OOB transfers despite premedication and presence of +2 assist. He demonstrated the ability to lateral scoot towards HOB using BUE support.    Ambulation/Gait               General Gait Details: Unable  Stairs            Wheelchair Mobility     Tilt Bed    Modified Rankin (Stroke Patients Only)       Balance Overall balance assessment: Needs assistance Sitting-balance support: Single extremity supported, Feet supported Sitting balance-Leahy Scale: Fair Sitting balance - Comments:  Pt sat EOB with supervision. Able to reach slightly anterior down legs. Pt with decreased dynamic sitting abilities to reach outside BOS.                                     Pertinent  Vitals/Pain Pain Assessment Pain Assessment: 0-10 Pain Score: 8  Pain Location: lower back, neck, and L residual limb. Pain Descriptors / Indicators: Operative site guarding, Discomfort, Aching, Sore Pain Intervention(s): Premedicated before session, Monitored during session, Limited activity within patient's tolerance, Patient requesting pain meds-RN notified    Home Living Family/patient expects to be discharged to:: Private residence Living Arrangements: Non-relatives/Friends Available Help at Discharge: Friend(s);Available PRN/intermittently Type of Home: House Home Access: Ramped entrance       Home Layout: One level Home Equipment: None Additional Comments: Pt is borrowing wheelchair. Pt reported that he is trying to get a drop arm BSC for home because he does not fit on his toilet. Unsure of current home bathroom set-up.    Prior Function Prior Level of Function : Independent/Modified Independent             Mobility Comments: Mobilizes using w/c d/t L foot pain. Transfers via stand pivot. Denies fall hx. ADLs Comments: Indep with ADLs/IADLs.     Extremity/Trunk Assessment   Upper Extremity Assessment Upper Extremity Assessment: Defer to OT evaluation    Lower Extremity Assessment Lower Extremity Assessment: RLE deficits/detail;LLE deficits/detail RLE Deficits / Details: Hx of BKA. Decrease knee ext, lacking ~5deg. Hip and knee flex WFL. Grossly 4/5 strength. RLE Sensation: WNL RLE Coordination: WNL LLE Deficits / Details: Pt POD 1 s/p BKA. Decreased hip and knee AROM d/t pain. Grossly 3-/5 strength. He reports the nerve block is continuing to wear off. LLE Sensation: decreased proprioception LLE Coordination: decreased gross motor    Cervical / Trunk Assessment Cervical / Trunk Assessment: Other exceptions Cervical / Trunk Exceptions: Increased Body Habitus; Thoracic Rounding  Communication   Communication Communication: No apparent difficulties     Cognition Arousal: Alert Behavior During Therapy: WFL for tasks assessed/performed   PT - Cognitive impairments: No apparent impairments                       PT - Cognition Comments: Pt A,Ox4 Following commands: Intact       Cueing Cueing Techniques: Verbal cues     General Comments General comments (skin integrity, edema, etc.): Pt educated on desensitization. Pt educated on importance of limb protector and LLE positioning but declining use and maintained seated EOB with B knees flex.    Exercises Amputee Exercises Knee Extension: Seated, Both, AROM, 10 reps (with 5 sec hold at top) Other Exercises Other Exercises: Educated pt on L BKA HEP. Provided handout (Medbridge Access Code: [email or web address])   Assessment/Plan    PT Assessment Patient needs continued PT services  PT Problem List Decreased strength;Decreased range of motion;Decreased activity tolerance;Decreased balance;Decreased mobility;Decreased safety awareness;Pain       PT Treatment Interventions DME instruction;Gait training;Functional mobility training;Therapeutic activities;Therapeutic exercise;Balance training;Patient/family education;Wheelchair mobility training    PT Goals (Current goals can be found in the Care Plan section)  Acute Rehab PT Goals Patient Stated Goal: Regain independence PT Goal Formulation: With patient Time For Goal Achievement: 09/15/24 Potential to Achieve Goals: Good    Frequency Min 2X/week     Co-evaluation PT/OT/SLP Co-Evaluation/Treatment: Yes Reason for Co-Treatment: Necessary to address cognition/behavior  during functional activity;To address functional/ADL transfers PT goals addressed during session: Mobility/safety with mobility;Balance OT goals addressed during session: ADL's and self-care;Strengthening/ROM;Proper use of Adaptive equipment and DME       AM-PAC PT 6 Clicks Mobility  Outcome Measure Help needed turning from your back to your side while in a flat  bed without using bedrails?: A Little Help needed moving from lying on your back to sitting on the side of a flat bed without using bedrails?: A Little Help needed moving to and from a bed to a chair (including a wheelchair)?: A Little Help needed standing up from a chair using your arms (e.g., wheelchair or bedside chair)?: Total Help needed to walk in hospital room?: Total Help needed climbing 3-5 steps with a railing? : Total 6 Click Score: 12    End of Session   Activity Tolerance: Patient tolerated treatment well;Other (comment) (Treatment limited secondary to pt's willingess to participate) Patient left: in bed;with call bell/phone within reach;with bed alarm set Nurse Communication: Mobility status PT Visit Diagnosis: Difficulty in walking, not elsewhere classified (R26.2);Pain;Other abnormalities of gait and mobility (R26.89);Unsteadiness on feet (R26.81) Pain - Right/Left: Left Pain - part of body: Knee;Leg    Time: 1018-1050 PT Time Calculation (min) (ACUTE ONLY): 32 min   Charges:   PT Evaluation $PT Re-evaluation: 1 Re-eval   PT General Charges $$ ACUTE PT VISIT: 1 Visit         Randall SAUNDERS, PT, DPT Acute Rehabilitation Services Office: [phone] Secure Chat Preferred  Delon CHRISTELLA Callander 09/01/2024, 1:42 PM

## 2024-09-01 NOTE — TOC Progression Note (Signed)
 Transition of Care [facility]) - Progression Note    Patient Details  Name: Ryan [name] MRN: [id number] Date of Birth: May 10, 1966  [facility] CM/SW Contact  Rosalva Jon Bloch, RN Phone Number: 09/01/2024, 1:20 PM  Clinical Narrative:    Pt declining SNF placement. States he is going home and friends will help with care. Pt agreeable to home health services. Pt without provider preference. NCM faxed home health referral via HUB, awaiting acceptances. Noted recommended DME, drop arm BSC, W/C and tub /shower seat. NCM faxed referral via HUB, awaiting acceptances.  IP CM team following and will continue assist with needs....  Expected Discharge Plan: Home w Home Health Services Barriers to Discharge: Continued Medical Work up               Expected Discharge Plan and Services In-house Referral: Clinical Social Work Discharge Planning Services: CM Consult Post Acute Care Choice:  (TBD) Living arrangements for the past 2 months: Single Family Home                           HH Arranged: IV Antibiotics, RN HH Agency: [facility] Date HH Agency Contacted: 08/25/24   Representative spoke with at [facility] Agency: Pam   Social Drivers of Health (SDOH) Interventions SDOH Screenings   Food Insecurity: Food Insecurity Present (08/21/2024)  Housing: Low Risk  (08/21/2024)  Transportation Needs: No Transportation Needs (08/21/2024)  Utilities: Not At Risk (08/21/2024)  Financial Resource Strain: High Risk (01/24/2021)   [facility] visits prior to 01/10/2023.  Social Connections: Unknown (08/21/2024)  Tobacco Use: High Risk (08/31/2024)    Readmission Risk Interventions     No data to display

## 2024-09-02 DIAGNOSIS — L03116 Cellulitis of left lower limb: Secondary | ICD-10-CM | POA: Diagnosis not present

## 2024-09-02 LAB — COMPREHENSIVE METABOLIC PANEL WITH GFR
ALT: 28 U/L (ref 0–44)
AST: 34 U/L (ref 15–41)
Albumin: 2.1 g/dL — ABNORMAL LOW (ref 3.5–5.0)
Alkaline Phosphatase: 109 U/L (ref 38–126)
Anion gap: 9 (ref 5–15)
BUN: 34 mg/dL — ABNORMAL HIGH (ref 6–20)
CO2: 27 mmol/L (ref 22–32)
Calcium: 8.9 mg/dL (ref 8.9–10.3)
Chloride: 97 mmol/L — ABNORMAL LOW (ref 98–111)
Creatinine, Ser: 1.05 mg/dL (ref 0.61–1.24)
GFR, Estimated: >60 mL/min (ref >60)
Glucose, Bld: 119 mg/dL — ABNORMAL HIGH (ref 70–99)
Potassium: 4.2 mmol/L (ref 3.5–5.1)
Sodium: 133 mmol/L — ABNORMAL LOW (ref 135–145)
Total Bilirubin: 0.5 mg/dL (ref 0.0–1.2)
Total Protein: 7.1 g/dL (ref 6.5–8.1)

## 2024-09-02 LAB — GLUCOSE, CAPILLARY
Glucose-Capillary: 112 mg/dL — ABNORMAL HIGH (ref 70–99)
Glucose-Capillary: 139 mg/dL — ABNORMAL HIGH (ref 70–99)
Glucose-Capillary: 156 mg/dL — ABNORMAL HIGH (ref 70–99)
Glucose-Capillary: 118 mg/dL — ABNORMAL HIGH (ref 70–99)
Glucose-Capillary: 134 mg/dL — ABNORMAL HIGH (ref 70–99)

## 2024-09-02 LAB — CBC
HCT: 29.9 % — ABNORMAL LOW (ref 39.0–52.0)
Hemoglobin: 9.2 g/dL — ABNORMAL LOW (ref 13.0–17.0)
MCH: 24.9 pg — ABNORMAL LOW (ref 26.0–34.0)
MCHC: 30.8 g/dL (ref 30.0–36.0)
MCV: 81 fL (ref 80.0–100.0)
Platelets: 453 K/uL — ABNORMAL HIGH (ref 150–400)
RBC: 3.69 MIL/uL — ABNORMAL LOW (ref 4.22–5.81)
RDW: 19.7 % — ABNORMAL HIGH (ref 11.5–15.5)
WBC: 12.5 K/uL — ABNORMAL HIGH (ref 4.0–10.5)
nRBC: 0 % (ref 0.0–0.2)

## 2024-09-02 LAB — SURGICAL PATHOLOGY

## 2024-09-02 LAB — MAGNESIUM: Magnesium: 1.8 mg/dL (ref 1.7–2.4)

## 2024-09-02 MED ORDER — OXYCODONE HCL 5 MG PO TABS
20.0000 mg | ORAL_TABLET | ORAL | Status: DC | PRN
  Administered 2024-09-02 – 2024-09-07 (×27): 20 mg via ORAL
  Filled 2024-09-02 (×27): qty 4

## 2024-09-02 NOTE — Progress Notes (Signed)
 Initial Nutrition Assessment  DOCUMENTATION CODES:   Not applicable  INTERVENTION:  Continue Carb modified diet Encourage PO intake  Insulin  regimen per Diabetes Management team    NUTRITION DIAGNOSIS:   Increased nutrient needs related to post-op healing as evidenced by  (s/p BKA).   GOAL:   Patient will meet greater than or equal to 90% of their needs   MONITOR:   PO intake  REASON FOR ASSESSMENT:   Consult Assessment of nutrition requirement/status, Diet education, Wound healing  ASSESSMENT:   PMH of DM-2, right BKA in 02/2023 and tobacco use disorder presenting with worsening left foot redness, pain and swelling with purulent drainage for last 2 months and admitted with diabetic foot infection, and L foot osteomyelitis.  Pt seen in room, sitting on edge of bed eating lunch. Day 2 post op left BKA (h/o of R. BKA 2024). Pt reports no changes to appetite PTA, eating well this admission. 95 % po intake of 6 meals documented in flowsheet, empty breakfast tray also in room. Pt admitted with elevated BG, reports did not have access to insulin  for the past month. Pt had not diabetes related nutrition questions or concerns at this time. Declined need for ensure or Glucerna supplementation. Pt noted to have CBG >300 yesterday, per DM coordinator note it was likely related to patient getting 10 mg decadron  they day before. Patient unsure of usual body weight, reports it may be in the 220s or 230s. Denies any noticeable recent weight changes.   Admit weight: 108.9 kg (stated weight, likely inaccurate) Current weight: 100.6 kg pre left BKA  Nutritionally Relevant Medications: colace,  SSI 0-20 units TID,  SSI 0-5 unites daily, Novolog  15 units TID, Semglee  45 units BID  Labs Reviewed: Sodium 133, Glu 119, BUN 34  NUTRITION - FOCUSED PHYSICAL EXAM:  Flowsheet Row Most Recent Value  Orbital Region No depletion  Upper Arm Region No depletion  Thoracic and Lumbar Region No  depletion  Buccal Region No depletion  Temple Region No depletion  Clavicle Bone Region No depletion  Clavicle and Acromion Bone Region Mild depletion  Scapular Bone Region No depletion  Dorsal Hand No depletion  Patellar Region Mild depletion  Anterior Thigh Region Mild depletion  Posterior Calf Region Unable to assess  [R BKA, pt c/o of pain in left leg after foot ampuation did not want assessed]  Hair Reviewed  Eyes Reviewed  Mouth Reviewed  Skin Reviewed  Nails Reviewed    Diet Order:   Diet Order             Diet Carb Modified Fluid consistency: Thin; Room service appropriate? Yes  Diet effective now                   EDUCATION NEEDS:   Education needs have been addressed  Skin:  Skin Assessment: Skin Integrity Issues: Skin Integrity Issues:: Diabetic Ulcer Diabetic Ulcer: left foot, s/p amputation  Last BM:  10/20  Height:   Ht Readings from Last 1 Encounters:  08/16/24 6' 1 (1.854 m)    Weight:   Wt Readings from Last 1 Encounters:  08/26/24 100.6 kg    Ideal Body Weight:  85.5 kg adj for BKAs   BMI:  Body mass index is 29.26 kg/m.  Estimated Nutritional Needs:   Kcal:  7849-7449  Protein:  103-128 g  Fluid:  2.1-2.5L/d  Madalyn Potters, MS, RD, LDN Clinical Dietitian  Contact via secure chat. If unavailable, use group chat RD Inpatient.

## 2024-09-02 NOTE — Progress Notes (Signed)
 PROGRESS NOTE  Ryan [name] FMW:[id number] DOB: Mar 16, 1966 DOA: 08/16/2024 PCP: [name] [name], NP   LOS: 16 days   Brief Narrative / Interim history: 58 year old M with PMH of DM-2, right BKA in 02/2023 and tobacco use disorder presenting with worsening left foot redness, pain and swelling with purulent drainage for last 2 months and admitted with diabetic foot infection, and L foot osteomyelitis.  He has been following up with [facility], and sent to ED for further evaluation.  Left foot x-ray shows left fifth metatarsal joint changes consistent with osteomyelitis.  Patient was admitted and orthopedics was consulted,  started on empiric antibiotics. Further workup showed MRSA bacteremia.  ID consulted and antibiotic changed to IV daptomycin.  Initially refused left BKA but he later agreed, underwent surgery on Wednesday  Subjective / 24h Interval events: Complains of significant pain in his leg, he does not feel it is controlled currently  Assesement and Plan: Principal problem Left foot osteomyelitis, diabetic foot infection, MRSA bacteremia -patient has been having worsening left foot wound over the last couple of months with x-ray concerning for osteomyelitis.  Initial blood cultures showed MRSA and ID was consulted.  Repeat blood cultures were negative, TTE and TEE were negative for vegetations.  ID recommends IV daptomycin until 10/24, 2 total weeks following negative blood cultures on 10/10, followed by p.o. doxycycline  100 mg BID through 11/21.  This was the original plan while he was refusing amputation.  Case discussed again with ID, Dr. Fleeta Rothman, as well as orthopedic surgery Dr. Harden.  Given that he underwent amputation and obtained source control, he will not need further antibiotics postoperatively and will discontinue daptomycin today after 2 weeks of total antibiotics - Increase oxycodone  for pain control  Active problems DM2, uncontrolled, with hyperglycemia -continue  glargine and sliding scale  Chronic diastolic CHF-LVEF 60 to 65%, appears euvolemic.  Continue Bumex   COPD-stable, no wheezing  Hyperkalemia-Lokelma x 1, potassium normalized this morning  Tobacco use-nicotine patch  Hyponatremia-mild  Scheduled Meds:  bumetanide   1 mg Oral Daily   Chlorhexidine  Gluconate Cloth  6 each Topical Daily   docusate sodium   100 mg Oral Daily   [START ON 09/03/2024] doxycycline   100 mg Oral Q12H   gabapentin   800 mg Oral TID   insulin  aspart  0-20 Units Subcutaneous TID WC   insulin  aspart  0-5 Units Subcutaneous QHS   insulin  aspart  15 Units Subcutaneous TID WC   insulin  glargine-yfgn  45 Units Subcutaneous BID   irbesartan  300 mg Oral Daily   mupirocin ointment  1 Application Nasal BID   Continuous Infusions:  PRN Meds:.acetaminophen  **OR** acetaminophen , hydrALAZINE , HYDROmorphone  (DILAUDID ) injection, ibuprofen, labetalol , melatonin, metoprolol  tartrate, ondansetron  (ZOFRAN ) IV, oxyCODONE , phenol  Current Outpatient Medications  Medication Instructions   albuterol  (VENTOLIN  HFA) 108 (90 Base) MCG/ACT inhaler 2 puffs, Inhalation, Every 4 hours PRN   ascorbic acid  (VITAMIN C ) 500 mg, Oral, 2 times daily   bisacodyl  (DULCOLAX) 5 mg, Oral, Daily PRN   brompheniramine-pseudoephedrine-DM 30-2-10 MG/5ML syrup 10 mLs, Oral, 4 times daily PRN   bumetanide  (BUMEX ) 1 MG tablet Take 2 mg two times daily for 5 days. Then switch to 1 mg two times daily   celecoxib (CELEBREX) 200 mg, Oral, Daily   collagenase  (SANTYL ) 250 UNIT/GM ointment 1 Application, Topical, Daily, Apply to the affected area daily plus dry dressing   docusate sodium  (COLACE) 100 mg, Oral, Daily   [START ON 09/03/2024] doxycycline  (VIBRA -TABS) 100 mg, Oral, 2  times daily, Start taking on 10/25 after IV antibiotics have been completed   famotidine (PEPCID) 20 mg, Oral, Daily   fenofibrate (TRICOR) 48 mg, Oral, Daily   gabapentin  (NEURONTIN ) 800 mg, Oral, 3 times daily   Glucagon  Emergency 1 mg, Intramuscular, As needed   hydrALAZINE  (APRESOLINE ) 25 mg, Oral, 3 times daily   Insulin  Aspart FlexPen (NOVOLOG ) 10 Units, Subcutaneous, 3 times daily before meals & bedtime, Hold for CBGs < 150 or if patient is not eating.   insulin  glargine-yfgn (SEMGLEE ) 30 Units, Subcutaneous, Every 12 hours, Hold if CBG is < 150   ipratropium-albuterol  (DUONEB) 0.5-2.5 (3) MG/3ML SOLN 3 mLs, Inhalation, Every 4 hours PRN   Jardiance 10 mg, Oral, Daily   melatonin 6 mg, Oral, Daily at bedtime   metFORMIN  (GLUCOPHAGE ) 1,000 mg, Oral, 2 times daily   metoprolol  tartrate (LOPRESSOR ) 12.5 mg, Oral, Daily   naloxone (NARCAN) nasal spray 4 mg/0.1 mL 1 spray, Nasal, Once PRN   oxyCODONE  (ROXICODONE ) 15 mg, Oral, Every 4 hours PRN   pantoprazole  (PROTONIX ) 40 mg, Oral, Daily at bedtime   polyethylene glycol (MIRALAX  / GLYCOLAX ) 17 g, Oral, Daily PRN   rOPINIRole (REQUIP) 1 mg, Oral, 3 times daily   TRELEGY ELLIPTA 100-62.5-25 MCG/ACT AEPB 1 puff, Inhalation, Daily   Diet Orders (From admission, onward)     Start     Ordered   08/31/24 1638  Diet Carb Modified Fluid consistency: Thin; Room service appropriate? Yes  Diet effective now       Question Answer Comment  Calorie Level Medium 1600-2000   Fluid consistency: Thin   Room service appropriate? Yes      08/31/24 1637           DVT prophylaxis: SCD's Start: 08/31/24 1638 SCDs Start: 08/17/24 0224  Lab Results  Component Value Date   PLT 453 (H) 09/02/2024      Code Status: Full Code  Family Communication: No family at bedside  Status is: Inpatient Remains inpatient appropriate because: Severity of illness   Level of care: Med-Surg  Consultants:  ID Orthopedic surgery  Objective: Vitals:   09/01/24 2027 09/01/24 2238 09/02/24 0401 09/02/24 0735  BP: 117/67  (!) 145/86 123/70  Pulse: (!) 114 (!) 107 98 (!) 103  Resp:    18  Temp: 98 F (36.7 C)  98.9 F (37.2 C) 98 F (36.7 C)  TempSrc:      SpO2: 99% 100%  97% 93%  Weight:      Height:        Intake/Output Summary (Last 24 hours) at 09/02/2024 1046 Last data filed at 09/02/2024 0400 Gross per 24 hour  Intake --  Output 1050 ml  Net -1050 ml   Wt Readings from Last 3 Encounters:  08/26/24 100.6 kg  05/19/24 107 kg  03/24/24 106.6 kg    Examination:  Constitutional: NAD Eyes: lids and conjunctivae normal, no scleral icterus ENMT: mmm Neck: normal, supple Respiratory: clear to auscultation bilaterally, no wheezing, no crackles.  Cardiovascular: Regular rate and rhythm, no murmurs / rubs / gallops. No LE edema. Abdomen: soft, no distention, no tenderness. Bowel sounds positive.    Data Reviewed: I have independently reviewed following labs and imaging studies   CBC Recent Labs  Lab 08/28/24 0729 08/31/24 0532 09/01/24 0339 09/02/24 0522  WBC 9.3 6.9 8.2 12.5*  HGB 12.4* 12.2* 9.9* 9.2*  HCT 41.9 41.8 33.2* 29.9*  PLT 381 388 353 453*  MCV 82.5 83.3 82.2 81.0  MCH 24.4* 24.3* 24.5* 24.9*  MCHC 29.6* 29.2* 29.8* 30.8  RDW 19.7* 19.9* 19.4* 19.7*    Recent Labs  Lab 08/28/24 0729 08/31/24 0532 09/01/24 0339 09/02/24 0522  NA 134* 133* 132* 133*  K 4.7 4.9 5.5* 4.2  CL 97* 100 98 97*  CO2 27 24 26 27   GLUCOSE 228* 217* 369* 119*  BUN 30* 31* 34* 34*  CREATININE 1.07 1.06 1.23 1.05  CALCIUM 8.7* 9.0 8.6* 8.9  AST  --   --  38 34  ALT  --   --  32 28  ALKPHOS  --   --  130* 109  BILITOT  --   --  0.7 0.5  ALBUMIN 2.0* 2.1* 1.9* 2.1*  MG 1.8 1.9 1.8 1.8    ------------------------------------------------------------------------------------------------------------------ No results for input(s): CHOL, HDL, LDLCALC, TRIG, CHOLHDL, LDLDIRECT in the last 72 hours.  Lab Results  Component Value Date   HGBA1C 13.2 (H) 08/16/2024   ------------------------------------------------------------------------------------------------------------------ No results for input(s): TSH, T4TOTAL, T3FREE,  THYROIDAB in the last 72 hours.  Invalid input(s): FREET3  Cardiac Enzymes No results for input(s): CKMB, TROPONINI, MYOGLOBIN in the last 168 hours.  Invalid input(s): CK ------------------------------------------------------------------------------------------------------------------ No results found for: BNP  CBG: Recent Labs  Lab 09/01/24 1208 09/01/24 1638 09/01/24 2127 09/02/24 0610 09/02/24 0747  GLUCAP 396* 228* 149* 112* 139*   No results found for this or any previous visit (from the past 240 hours).   Radiology Studies: No results found.  Nilda Fendt, MD, PhD [facility]  Between 7 am - 7 pm I am available, please contact me via Amion (for emergencies) or Securechat (non urgent messages)  Between 7 pm - 7 am I am not available, please contact night coverage MD/APP via Amion

## 2024-09-02 NOTE — Plan of Care (Signed)
  Problem: Education: Goal: Ability to describe self-care measures that may prevent or decrease complications (Diabetes Survival Skills Education) will improve Outcome: Progressing Goal: Individualized Educational Video(s) Outcome: Progressing   Problem: Coping: Goal: Ability to adjust to condition or change in health will improve Outcome: Progressing   Problem: Fluid Volume: Goal: Ability to maintain a balanced intake and output will improve Outcome: Progressing   Problem: Health Behavior/Discharge Planning: Goal: Ability to identify and utilize available resources and services will improve Outcome: Progressing Goal: Ability to manage health-related needs will improve Outcome: Progressing   Problem: Metabolic: Goal: Ability to maintain appropriate glucose levels will improve Outcome: Progressing   Problem: Nutritional: Goal: Maintenance of adequate nutrition will improve Outcome: Progressing Goal: Progress toward achieving an optimal weight will improve Outcome: Progressing   Problem: Skin Integrity: Goal: Risk for impaired skin integrity will decrease Outcome: Progressing   Problem: Tissue Perfusion: Goal: Adequacy of tissue perfusion will improve Outcome: Progressing   Problem: Education: Goal: Knowledge of General Education information will improve Description: Including pain rating scale, medication(s)/side effects and non-pharmacologic comfort measures Outcome: Progressing   Problem: Health Behavior/Discharge Planning: Goal: Ability to manage health-related needs will improve Outcome: Progressing   Problem: Clinical Measurements: Goal: Ability to maintain clinical measurements within normal limits will improve Outcome: Progressing Goal: Will remain free from infection Outcome: Progressing Goal: Diagnostic test results will improve Outcome: Progressing Goal: Respiratory complications will improve Outcome: Progressing Goal: Cardiovascular complication will  be avoided Outcome: Progressing   Problem: Activity: Goal: Risk for activity intolerance will decrease Outcome: Progressing   Problem: Nutrition: Goal: Adequate nutrition will be maintained Outcome: Progressing   Problem: Coping: Goal: Level of anxiety will decrease Outcome: Progressing   Problem: Elimination: Goal: Will not experience complications related to bowel motility Outcome: Progressing Goal: Will not experience complications related to urinary retention Outcome: Progressing   Problem: Pain Managment: Goal: General experience of comfort will improve and/or be controlled Outcome: Progressing   Problem: Safety: Goal: Ability to remain free from injury will improve Outcome: Progressing   Problem: Skin Integrity: Goal: Risk for impaired skin integrity will decrease Outcome: Progressing   Problem: Education: Goal: Knowledge of the prescribed therapeutic regimen will improve Outcome: Progressing Goal: Ability to verbalize activity precautions or restrictions will improve Outcome: Progressing Goal: Understanding of discharge needs will improve Outcome: Progressing   Problem: Activity: Goal: Ability to perform//tolerate increased activity and mobilize with assistive devices will improve Outcome: Progressing   Problem: Clinical Measurements: Goal: Postoperative complications will be avoided or minimized Outcome: Progressing   Problem: Self-Care: Goal: Ability to meet self-care needs will improve Outcome: Progressing   Problem: Self-Concept: Goal: Ability to maintain and perform role responsibilities to the fullest extent possible will improve Outcome: Progressing   Problem: Pain Management: Goal: Pain level will decrease with appropriate interventions Outcome: Progressing   Problem: Skin Integrity: Goal: Demonstration of wound healing without infection will improve Outcome: Progressing

## 2024-09-03 DIAGNOSIS — L03116 Cellulitis of left lower limb: Secondary | ICD-10-CM | POA: Diagnosis not present

## 2024-09-03 LAB — COMPREHENSIVE METABOLIC PANEL WITH GFR
ALT: 29 U/L (ref 0–44)
AST: 37 U/L (ref 15–41)
Albumin: 2.2 g/dL — ABNORMAL LOW (ref 3.5–5.0)
Alkaline Phosphatase: 102 U/L (ref 38–126)
Anion gap: 9 (ref 5–15)
BUN: 38 mg/dL — ABNORMAL HIGH (ref 6–20)
CO2: 28 mmol/L (ref 22–32)
Calcium: 8.8 mg/dL — ABNORMAL LOW (ref 8.9–10.3)
Chloride: 95 mmol/L — ABNORMAL LOW (ref 98–111)
Creatinine, Ser: 1.1 mg/dL (ref 0.61–1.24)
GFR, Estimated: >60 mL/min (ref >60)
Glucose, Bld: 107 mg/dL — ABNORMAL HIGH (ref 70–99)
Potassium: 4.9 mmol/L (ref 3.5–5.1)
Sodium: 132 mmol/L — ABNORMAL LOW (ref 135–145)
Total Bilirubin: 0.5 mg/dL (ref 0.0–1.2)
Total Protein: 7.4 g/dL (ref 6.5–8.1)

## 2024-09-03 LAB — CBC
HCT: 32.1 % — ABNORMAL LOW (ref 39.0–52.0)
Hemoglobin: 9.6 g/dL — ABNORMAL LOW (ref 13.0–17.0)
MCH: 24.7 pg — ABNORMAL LOW (ref 26.0–34.0)
MCHC: 29.9 g/dL — ABNORMAL LOW (ref 30.0–36.0)
MCV: 82.5 fL (ref 80.0–100.0)
Platelets: 389 K/uL (ref 150–400)
RBC: 3.89 MIL/uL — ABNORMAL LOW (ref 4.22–5.81)
RDW: 19.7 % — ABNORMAL HIGH (ref 11.5–15.5)
WBC: 11 K/uL — ABNORMAL HIGH (ref 4.0–10.5)
nRBC: 0 % (ref 0.0–0.2)

## 2024-09-03 LAB — GLUCOSE, CAPILLARY
Glucose-Capillary: 91 mg/dL (ref 70–99)
Glucose-Capillary: 194 mg/dL — ABNORMAL HIGH (ref 70–99)
Glucose-Capillary: 169 mg/dL — ABNORMAL HIGH (ref 70–99)
Glucose-Capillary: 227 mg/dL — ABNORMAL HIGH (ref 70–99)

## 2024-09-03 LAB — MAGNESIUM: Magnesium: 1.8 mg/dL (ref 1.7–2.4)

## 2024-09-03 NOTE — Progress Notes (Signed)
 PROGRESS NOTE  Ryan [name] FMW:[id number] DOB: 1965-12-07 DOA: 08/16/2024 PCP: [name] [name], NP   LOS: 17 days   Brief Narrative / Interim history: 58 year old M with PMH of DM-2, right BKA in 02/2023 and tobacco use disorder presenting with worsening left foot redness, pain and swelling with purulent drainage for last 2 months and admitted with diabetic foot infection, and L foot osteomyelitis.  He has been following up with [facility], and sent to ED for further evaluation.  Left foot x-ray shows left fifth metatarsal joint changes consistent with osteomyelitis.  Patient was admitted and orthopedics was consulted,  started on empiric antibiotics. Further workup showed MRSA bacteremia.  ID consulted and antibiotic changed to IV daptomycin.  Initially refused left BKA but he later agreed, underwent surgery on Wednesday  Subjective / 24h Interval events: Still with pain, he does not want to go to SNF but also does not want to go home either  Assesement and Plan: Principal problem Left foot osteomyelitis, diabetic foot infection, MRSA bacteremia -patient has been having worsening left foot wound over the last couple of months with x-ray concerning for osteomyelitis.  Initial blood cultures showed MRSA and ID was consulted.  Repeat blood cultures were negative, TTE and TEE were negative for vegetations.  ID recommends IV daptomycin until 10/24, 2 total weeks following negative blood cultures on 10/10, followed by p.o. doxycycline  100 mg BID through 11/21.  This was the original plan while he was refusing amputation.  Case discussed again with ID, Dr. Fleeta Rothman, as well as orthopedic surgery Dr. Harden.  Given that he underwent amputation and obtained source control, he will not need further antibiotics postoperatively and will discontinue daptomycin today after 2 weeks of total antibiotics - Controlled better, continue oxycodone   Active problems DM2, uncontrolled, with hyperglycemia  -continue glargine and sliding scale  Chronic diastolic CHF-LVEF 60 to 65%, appears euvolemic.  Continue Bumex   COPD-stable, no wheezing  Hyperkalemia-Lokelma x 1, potassium normalized this morning  Tobacco use-nicotine patch  Hyponatremia-mild  Disposition-Long discussion with the patient today, he does not want to go home as he feels like he is not ready, but also does not want to go to SNF.  It is an overall difficult situation since he does need help and I agree that home may be unsafe, however given his refusal to go to SNF, this does not necessarily warrant him to stay in the hospital a lot longer.  Plan for discharge home tomorrow  Scheduled Meds:  bumetanide   1 mg Oral Daily   Chlorhexidine  Gluconate Cloth  6 each Topical Daily   docusate sodium   100 mg Oral Daily   doxycycline   100 mg Oral Q12H   gabapentin   800 mg Oral TID   insulin  aspart  0-20 Units Subcutaneous TID WC   insulin  aspart  0-5 Units Subcutaneous QHS   insulin  aspart  15 Units Subcutaneous TID WC   insulin  glargine-yfgn  45 Units Subcutaneous BID   irbesartan  300 mg Oral Daily   mupirocin ointment  1 Application Nasal BID   Continuous Infusions:  PRN Meds:.acetaminophen  **OR** acetaminophen , hydrALAZINE , HYDROmorphone  (DILAUDID ) injection, labetalol , melatonin, metoprolol  tartrate, ondansetron  (ZOFRAN ) IV, oxyCODONE , phenol  Current Outpatient Medications  Medication Instructions   albuterol  (VENTOLIN  HFA) 108 (90 Base) MCG/ACT inhaler 2 puffs, Inhalation, Every 4 hours PRN   ascorbic acid  (VITAMIN C ) 500 mg, Oral, 2 times daily   bisacodyl  (DULCOLAX) 5 mg, Oral, Daily PRN   brompheniramine-pseudoephedrine-DM 30-2-10 MG/5ML syrup  10 mLs, Oral, 4 times daily PRN   bumetanide  (BUMEX ) 1 MG tablet Take 2 mg two times daily for 5 days. Then switch to 1 mg two times daily   celecoxib (CELEBREX) 200 mg, Oral, Daily   collagenase  (SANTYL ) 250 UNIT/GM ointment 1 Application, Topical, Daily, Apply to the  affected area daily plus dry dressing   docusate sodium  (COLACE) 100 mg, Oral, Daily   doxycycline  (VIBRA -TABS) 100 mg, Oral, 2 times daily, Start taking on 10/25 after IV antibiotics have been completed   famotidine (PEPCID) 20 mg, Oral, Daily   fenofibrate (TRICOR) 48 mg, Oral, Daily   gabapentin  (NEURONTIN ) 800 mg, Oral, 3 times daily   Glucagon Emergency 1 mg, Intramuscular, As needed   hydrALAZINE  (APRESOLINE ) 25 mg, Oral, 3 times daily   Insulin  Aspart FlexPen (NOVOLOG ) 10 Units, Subcutaneous, 3 times daily before meals & bedtime, Hold for CBGs < 150 or if patient is not eating.   insulin  glargine-yfgn (SEMGLEE ) 30 Units, Subcutaneous, Every 12 hours, Hold if CBG is < 150   ipratropium-albuterol  (DUONEB) 0.5-2.5 (3) MG/3ML SOLN 3 mLs, Inhalation, Every 4 hours PRN   Jardiance 10 mg, Oral, Daily   melatonin 6 mg, Oral, Daily at bedtime   metFORMIN  (GLUCOPHAGE ) 1,000 mg, Oral, 2 times daily   metoprolol  tartrate (LOPRESSOR ) 12.5 mg, Oral, Daily   naloxone (NARCAN) nasal spray 4 mg/0.1 mL 1 spray, Nasal, Once PRN   oxyCODONE  (ROXICODONE ) 15 mg, Oral, Every 4 hours PRN   pantoprazole  (PROTONIX ) 40 mg, Oral, Daily at bedtime   polyethylene glycol (MIRALAX  / GLYCOLAX ) 17 g, Oral, Daily PRN   rOPINIRole (REQUIP) 1 mg, Oral, 3 times daily   TRELEGY ELLIPTA 100-62.5-25 MCG/ACT AEPB 1 puff, Inhalation, Daily   Diet Orders (From admission, onward)     Start     Ordered   08/31/24 1638  Diet Carb Modified Fluid consistency: Thin; Room service appropriate? Yes  Diet effective now       Question Answer Comment  Calorie Level Medium 1600-2000   Fluid consistency: Thin   Room service appropriate? Yes      08/31/24 1637           DVT prophylaxis: SCD's Start: 08/31/24 1638 SCDs Start: 08/17/24 0224  Lab Results  Component Value Date   PLT 389 09/03/2024      Code Status: Full Code  Family Communication: No family at bedside  Status is: Inpatient Remains inpatient appropriate  because: Severity of illness   Level of care: Med-Surg  Consultants:  ID Orthopedic surgery  Objective: Vitals:   09/02/24 0735 09/02/24 1422 09/03/24 0426 09/03/24 0717  BP: 123/70 105/70 107/69 119/78  Pulse: (!) 103 (!) 107 99 (!) 103  Resp: 18  16 17   Temp: 98 F (36.7 C) 98 F (36.7 C)  99.1 F (37.3 C)  TempSrc:    Oral  SpO2: 93% 98% 100% 95%  Weight:      Height:        Intake/Output Summary (Last 24 hours) at 09/03/2024 1345 Last data filed at 09/03/2024 0900 Gross per 24 hour  Intake 240 ml  Output 1800 ml  Net -1560 ml   Wt Readings from Last 3 Encounters:  08/26/24 100.6 kg  05/19/24 107 kg  03/24/24 106.6 kg    Examination:  Constitutional: NAD Eyes: lids and conjunctivae normal, no scleral icterus ENMT: mmm Neck: normal, supple Respiratory: clear to auscultation bilaterally, no wheezing, no crackles.  Cardiovascular: Regular rate and rhythm, no murmurs /  rubs / gallops. No LE edema. Abdomen: soft, no distention, no tenderness. Bowel sounds positive.    Data Reviewed: I have independently reviewed following labs and imaging studies   CBC Recent Labs  Lab 08/28/24 0729 08/31/24 0532 09/01/24 0339 09/02/24 0522 09/03/24 0508  WBC 9.3 6.9 8.2 12.5* 11.0*  HGB 12.4* 12.2* 9.9* 9.2* 9.6*  HCT 41.9 41.8 33.2* 29.9* 32.1*  PLT 381 388 353 453* 389  MCV 82.5 83.3 82.2 81.0 82.5  MCH 24.4* 24.3* 24.5* 24.9* 24.7*  MCHC 29.6* 29.2* 29.8* 30.8 29.9*  RDW 19.7* 19.9* 19.4* 19.7* 19.7*    Recent Labs  Lab 08/28/24 0729 08/31/24 0532 09/01/24 0339 09/02/24 0522 09/03/24 0508  NA 134* 133* 132* 133* 132*  K 4.7 4.9 5.5* 4.2 4.9  CL 97* 100 98 97* 95*  CO2 27 24 26 27 28   GLUCOSE 228* 217* 369* 119* 107*  BUN 30* 31* 34* 34* 38*  CREATININE 1.07 1.06 1.23 1.05 1.10  CALCIUM 8.7* 9.0 8.6* 8.9 8.8*  AST  --   --  38 34 37  ALT  --   --  32 28 29  ALKPHOS  --   --  130* 109 102  BILITOT  --   --  0.7 0.5 0.5  ALBUMIN 2.0* 2.1* 1.9*  2.1* 2.2*  MG 1.8 1.9 1.8 1.8 1.8    ------------------------------------------------------------------------------------------------------------------ No results for input(s): CHOL, HDL, LDLCALC, TRIG, CHOLHDL, LDLDIRECT in the last 72 hours.  Lab Results  Component Value Date   HGBA1C 13.2 (H) 08/16/2024   ------------------------------------------------------------------------------------------------------------------ No results for input(s): TSH, T4TOTAL, T3FREE, THYROIDAB in the last 72 hours.  Invalid input(s): FREET3  Cardiac Enzymes No results for input(s): CKMB, TROPONINI, MYOGLOBIN in the last 168 hours.  Invalid input(s): CK ------------------------------------------------------------------------------------------------------------------ No results found for: BNP  CBG: Recent Labs  Lab 09/02/24 1140 09/02/24 1556 09/02/24 2126 09/03/24 0620 09/03/24 1119  GLUCAP 156* 118* 134* 91 194*   No results found for this or any previous visit (from the past 240 hours).   Radiology Studies: No results found.  Nilda Fendt, MD, PhD [facility]  Between 7 am - 7 pm I am available, please contact me via Amion (for emergencies) or Securechat (non urgent messages)  Between 7 pm - 7 am I am not available, please contact night coverage MD/APP via Amion

## 2024-09-03 NOTE — Plan of Care (Signed)
  Problem: Education: Goal: Knowledge of General Education information will improve Description: Including pain rating scale, medication(s)/side effects and non-pharmacologic comfort measures Outcome: Progressing   Problem: Activity: Goal: Risk for activity intolerance will decrease Outcome: Progressing   Problem: Pain Managment: Goal: General experience of comfort will improve and/or be controlled Outcome: Progressing

## 2024-09-03 NOTE — Progress Notes (Signed)
 Had an extensive discussion with patient about choice of drinks, patient insisted on this nurse that he only drinks regular coke. This nurse told him that I will not be providing him that drink as he is diabetic and has bilateral BKA now, wherein he just had his left BKA done during this admission. I offered coke zero which is appropriate for his diet. Patient asked nurse to just throw it in the bin as he doesn't drink diet or zero drinks.

## 2024-09-03 NOTE — Progress Notes (Signed)
 Patient ID: Ryan [name], male   DOB: Jun 28, 1966, 58 y.o.   MRN: [id number] Patient's dressing is clean and dry today for the left transtibial amputation.  Patient states he has occasional episodes of drainage.  I will change the dressing in order to change as needed.  Plan for discharge to skilled nursing.  I will follow-up in the office in 1 week.

## 2024-09-03 NOTE — Plan of Care (Signed)
  Problem: Education: Goal: Ability to describe self-care measures that may prevent or decrease complications (Diabetes Survival Skills Education) will improve Outcome: Progressing Goal: Individualized Educational Video(s) Outcome: Progressing   Problem: Coping: Goal: Ability to adjust to condition or change in health will improve Outcome: Progressing   Problem: Fluid Volume: Goal: Ability to maintain a balanced intake and output will improve Outcome: Progressing   Problem: Health Behavior/Discharge Planning: Goal: Ability to identify and utilize available resources and services will improve Outcome: Progressing Goal: Ability to manage health-related needs will improve Outcome: Progressing   Problem: Metabolic: Goal: Ability to maintain appropriate glucose levels will improve Outcome: Progressing   Problem: Nutritional: Goal: Maintenance of adequate nutrition will improve Outcome: Progressing Goal: Progress toward achieving an optimal weight will improve Outcome: Progressing   Problem: Skin Integrity: Goal: Risk for impaired skin integrity will decrease Outcome: Progressing   Problem: Tissue Perfusion: Goal: Adequacy of tissue perfusion will improve Outcome: Progressing   Problem: Education: Goal: Knowledge of General Education information will improve Description: Including pain rating scale, medication(s)/side effects and non-pharmacologic comfort measures Outcome: Progressing   Problem: Health Behavior/Discharge Planning: Goal: Ability to manage health-related needs will improve Outcome: Progressing   Problem: Clinical Measurements: Goal: Ability to maintain clinical measurements within normal limits will improve Outcome: Progressing Goal: Will remain free from infection Outcome: Progressing Goal: Diagnostic test results will improve Outcome: Progressing Goal: Respiratory complications will improve Outcome: Progressing Goal: Cardiovascular complication will  be avoided Outcome: Progressing   Problem: Activity: Goal: Risk for activity intolerance will decrease Outcome: Progressing   Problem: Nutrition: Goal: Adequate nutrition will be maintained Outcome: Progressing   Problem: Coping: Goal: Level of anxiety will decrease Outcome: Progressing   Problem: Elimination: Goal: Will not experience complications related to bowel motility Outcome: Progressing Goal: Will not experience complications related to urinary retention Outcome: Progressing   Problem: Pain Managment: Goal: General experience of comfort will improve and/or be controlled Outcome: Progressing   Problem: Safety: Goal: Ability to remain free from injury will improve Outcome: Progressing   Problem: Skin Integrity: Goal: Risk for impaired skin integrity will decrease Outcome: Progressing   Problem: Education: Goal: Knowledge of the prescribed therapeutic regimen will improve Outcome: Progressing Goal: Ability to verbalize activity precautions or restrictions will improve Outcome: Progressing Goal: Understanding of discharge needs will improve Outcome: Progressing   Problem: Activity: Goal: Ability to perform//tolerate increased activity and mobilize with assistive devices will improve Outcome: Progressing   Problem: Clinical Measurements: Goal: Postoperative complications will be avoided or minimized Outcome: Progressing   Problem: Self-Care: Goal: Ability to meet self-care needs will improve Outcome: Progressing   Problem: Self-Concept: Goal: Ability to maintain and perform role responsibilities to the fullest extent possible will improve Outcome: Progressing   Problem: Pain Management: Goal: Pain level will decrease with appropriate interventions Outcome: Progressing   Problem: Skin Integrity: Goal: Demonstration of wound healing without infection will improve Outcome: Progressing

## 2024-09-03 NOTE — Progress Notes (Signed)
 Paged Lynwood Kipper about VTE prophylaxis as SCD's are not appropriate for patient now given his bilateral BKA.

## 2024-09-04 DIAGNOSIS — B9562 Methicillin resistant Staphylococcus aureus infection as the cause of diseases classified elsewhere: Secondary | ICD-10-CM | POA: Diagnosis not present

## 2024-09-04 DIAGNOSIS — L03116 Cellulitis of left lower limb: Secondary | ICD-10-CM | POA: Diagnosis not present

## 2024-09-04 DIAGNOSIS — M86272 Subacute osteomyelitis, left ankle and foot: Secondary | ICD-10-CM | POA: Diagnosis not present

## 2024-09-04 DIAGNOSIS — L089 Local infection of the skin and subcutaneous tissue, unspecified: Secondary | ICD-10-CM

## 2024-09-04 DIAGNOSIS — R7881 Bacteremia: Secondary | ICD-10-CM | POA: Diagnosis not present

## 2024-09-04 LAB — GLUCOSE, CAPILLARY
Glucose-Capillary: 119 mg/dL — ABNORMAL HIGH (ref 70–99)
Glucose-Capillary: 127 mg/dL — ABNORMAL HIGH (ref 70–99)
Glucose-Capillary: 153 mg/dL — ABNORMAL HIGH (ref 70–99)
Glucose-Capillary: 178 mg/dL — ABNORMAL HIGH (ref 70–99)

## 2024-09-04 MED ORDER — DAPTOMYCIN-SODIUM CHLORIDE 700-0.9 MG/100ML-% IV SOLN
700.0000 mg | Freq: Every day | INTRAVENOUS | Status: DC
  Administered 2024-09-04: 700 mg via INTRAVENOUS
  Filled 2024-09-04: qty 100

## 2024-09-04 NOTE — Plan of Care (Signed)
  Problem: Coping: Goal: Ability to adjust to condition or change in health will improve Outcome: Not Progressing   Problem: Metabolic: Goal: Ability to maintain appropriate glucose levels will improve Outcome: Not Progressing   Problem: Nutritional: Goal: Maintenance of adequate nutrition will improve Outcome: Not Progressing Goal: Progress toward achieving an optimal weight will improve Outcome: Not Progressing   Problem: Skin Integrity: Goal: Risk for impaired skin integrity will decrease Outcome: Not Progressing   Problem: Tissue Perfusion: Goal: Adequacy of tissue perfusion will improve Outcome: Not Progressing   Problem: Clinical Measurements: Goal: Will remain free from infection Outcome: Not Progressing   Problem: Nutrition: Goal: Adequate nutrition will be maintained Outcome: Not Progressing   Problem: Pain Managment: Goal: General experience of comfort will improve and/or be controlled Outcome: Not Progressing   Problem: Safety: Goal: Ability to remain free from injury will improve Outcome: Not Progressing

## 2024-09-04 NOTE — Progress Notes (Signed)
 Patient refuses to follow wound care dressing orders of applying moist gauze to wound,patient told this nurse I have been doing this for many years, I don't want the gauze wet, it damages my skin. Explained to patient that his previous dressing recommendation is probably changed from what is required at this admission, patient still insist he wants gauze dry. Patient also advised not to touch gauze while this nurse is doing dressing change even if he just wants to help as he has not done handwashing or is wearing gloves to prevent infecting site.

## 2024-09-04 NOTE — Progress Notes (Addendum)
 Subjective:  He wants to go home and he still finds dressing changes very painful and was unwilling to have me examine his stump site on the left side he is not terribly helpful in terms of letting me understand if the erythema in his left leg has been worse recently or not   Antibiotics:  Anti-infectives (From admission, onward)    Start     Dose/Rate Route Frequency Ordered Stop   09/04/24 0900  DAPTOmycin (CUBICIN) IVPB 700 mg/133mL premix  Status:  Discontinued        700 mg 200 mL/hr over 30 Minutes Intravenous Daily 09/04/24 0808 09/04/24 1624   09/03/24 1000  doxycycline  (VIBRA -TABS) tablet 100 mg  Status:  Discontinued        100 mg Oral Every 12 hours 08/25/24 0935 09/04/24 0719   09/03/24 0000  doxycycline  (VIBRA -TABS) 100 MG tablet        100 mg Oral 2 times daily 08/25/24 0938 10/01/24 2359   09/02/24 0800  DAPTOmycin (CUBICIN) IVPB 700 mg/155mL premix        8 mg/kg  91.5 kg (Adjusted) 200 mL/hr over 30 Minutes Intravenous Daily 09/01/24 1107 09/02/24 0920   08/31/24 1300  ceFAZolin  (ANCEF ) IVPB 2g/100 mL premix        2 g 200 mL/hr over 30 Minutes Intravenous On call to O.R. 08/31/24 1258 08/31/24 1543   08/31/24 1300  vancomycin  (VANCOREADY) IVPB 1500 mg/300 mL        1,500 mg 150 mL/hr over 120 Minutes Intravenous On call to O.R. 08/31/24 1258 08/31/24 1543   08/20/24 1500  levofloxacin (LEVAQUIN) tablet 750 mg  Status:  Discontinued        750 mg Oral Daily 08/20/24 1410 08/23/24 0916   08/20/24 1500  metroNIDAZOLE (FLAGYL) tablet 500 mg  Status:  Discontinued        500 mg Oral Every 12 hours 08/20/24 1410 08/23/24 0916   08/18/24 1600  DAPTOmycin (CUBICIN) IVPB 700 mg/132mL premix  Status:  Discontinued        6 mg/kg  108.9 kg 200 mL/hr over 30 Minutes Intravenous Daily 08/18/24 1519 08/18/24 1529   08/18/24 1600  DAPTOmycin (CUBICIN) IVPB 700 mg/128mL premix  Status:  Discontinued        8 mg/kg  91.5 kg (Adjusted) 200 mL/hr over 30 Minutes  Intravenous Daily 08/18/24 1529 09/01/24 1106   08/18/24 0930  vancomycin  (VANCOCIN ) IVPB 1000 mg/200 mL premix  Status:  Discontinued       Placed in Followed by Linked Group   1,000 mg 200 mL/hr over 60 Minutes Intravenous Every 12 hours 08/18/24 0916 08/18/24 1519   08/18/24 0715  vancomycin  (VANCOREADY) IVPB 1250 mg/250 mL  Status:  Discontinued       Placed in Followed by Linked Group   1,250 mg 166.7 mL/hr over 90 Minutes Intravenous Every 12 hours 08/17/24 1914 08/18/24 0916   08/17/24 1915  vancomycin  (VANCOREADY) IVPB 2000 mg/400 mL       Placed in Followed by Linked Group   2,000 mg 200 mL/hr over 120 Minutes Intravenous  Once 08/17/24 1914 08/17/24 2303   08/17/24 1000  linezolid (ZYVOX) IVPB 600 mg  Status:  Discontinued        600 mg 300 mL/hr over 60 Minutes Intravenous Every 12 hours 08/17/24 0228 08/17/24 1914   08/17/24 1000  piperacillin-tazobactam (ZOSYN) IVPB 3.375 g  Status:  Discontinued        3.375  g 12.5 mL/hr over 240 Minutes Intravenous Every 8 hours 08/17/24 0303 08/20/24 1409   08/17/24 0200  piperacillin-tazobactam (ZOSYN) IVPB 3.375 g        3.375 g 100 mL/hr over 30 Minutes Intravenous  Once 08/17/24 0158 08/17/24 0216   08/17/24 0200  linezolid (ZYVOX) IVPB 600 mg        600 mg 300 mL/hr over 60 Minutes Intravenous  Once 08/17/24 0158 08/17/24 0415       Medications: Scheduled Meds:  bumetanide   1 mg Oral Daily   Chlorhexidine  Gluconate Cloth  6 each Topical Daily   docusate sodium   100 mg Oral Daily   gabapentin   800 mg Oral TID   insulin  aspart  0-20 Units Subcutaneous TID WC   insulin  aspart  0-5 Units Subcutaneous QHS   insulin  aspart  15 Units Subcutaneous TID WC   insulin  glargine-yfgn  45 Units Subcutaneous BID   irbesartan  300 mg Oral Daily   mupirocin ointment  1 Application Nasal BID   Continuous Infusions: PRN Meds:.acetaminophen  **OR** acetaminophen , hydrALAZINE , HYDROmorphone  (DILAUDID ) injection, labetalol , melatonin,  metoprolol  tartrate, ondansetron  (ZOFRAN ) IV, oxyCODONE , phenol    Objective: Weight change:   Intake/Output Summary (Last 24 hours) at 09/04/2024 1635 Last data filed at 09/04/2024 1356 Gross per 24 hour  Intake 1194 ml  Output 2940 ml  Net -1746 ml   Blood pressure 105/68, pulse (!) 102, temperature 98.2 F (36.8 C), temperature source Oral, resp. rate 18, height 6' 1 (1.854 m), weight 100.6 kg, SpO2 93%. Temp:  [97.8 F (36.6 C)-98.7 F (37.1 C)] 98.2 F (36.8 C) (10/26 1442) Pulse Rate:  [95-103] 102 (10/26 1442) Resp:  [16-18] 18 (10/26 1442) BP: (105-126)/(68-82) 105/68 (10/26 1442) SpO2:  [93 %-100 %] 93 % (10/26 1442)  Physical Exam: Physical Exam Constitutional:      Appearance: Normal appearance.  HENT:     Head: Normocephalic and atraumatic.  Eyes:     General:        Right eye: No discharge.        Left eye: No discharge.     Extraocular Movements: Extraocular movements intact.     Pupils: Pupils are equal, round, and reactive to light.  Cardiovascular:     Rate and Rhythm: Normal rate and regular rhythm.  Pulmonary:     Effort: No respiratory distress.     Breath sounds: No wheezing.  Abdominal:     General: There is no distension.  Musculoskeletal:     Cervical back: Normal range of motion and neck supple.  Skin:    General: Skin is warm and dry.  Neurological:     General: No focal deficit present.     Mental Status: He is alert and oriented to person, place, and time.  Psychiatric:        Mood and Affect: Mood normal.        Behavior: Behavior normal.        Thought Content: Thought content normal.        Judgment: Judgment normal.     Right BKA   Left BkA site bandaged erythema visible but I have no personal baseline, it is acutely worse per Dr. Ara serial examinations.     CBC:    BMET Recent Labs    09/02/24 0522 09/03/24 0508  NA 133* 132*  K 4.2 4.9  CL 97* 95*  CO2 27 28  GLUCOSE 119* 107*  BUN 34* 38*   CREATININE 1.05 1.10  CALCIUM  8.9 8.8*     Liver Panel  Recent Labs    09/02/24 0522 09/03/24 0508  PROT 7.1 7.4  ALBUMIN 2.1* 2.2*  AST 34 37  ALT 28 29  ALKPHOS 109 102  BILITOT 0.5 0.5       Sedimentation Rate No results for input(s): ESRSEDRATE in the last 72 hours. C-Reactive Protein No results for input(s): CRP in the last 72 hours.  Micro Results: Recent Results (from the past 720 hours)  Blood culture (routine x 2)     Status: Abnormal   Collection Time: 08/17/24  1:00 AM   Specimen: BLOOD  Result Value Ref Range Status   Specimen Description BLOOD SITE NOT SPECIFIED  Final   Special Requests   Final    BOTTLES DRAWN AEROBIC AND ANAEROBIC Blood Culture adequate volume   Culture  Setup Time   Final    GRAM POSITIVE COCCI IN BOTH AEROBIC AND ANAEROBIC BOTTLES CRITICAL VALUE NOTED.  VALUE IS CONSISTENT WITH PREVIOUSLY REPORTED AND CALLED VALUE.    Culture (A)  Final    STAPHYLOCOCCUS AUREUS SUSCEPTIBILITIES PERFORMED ON PREVIOUS CULTURE WITHIN THE LAST 5 DAYS. Performed at [facility], [address], [place], [place] [address]    Report Status 08/19/2024 FINAL  Final  Blood culture (routine x 2)     Status: Abnormal   Collection Time: 08/17/24  1:05 AM   Specimen: BLOOD  Result Value Ref Range Status   Specimen Description BLOOD SITE NOT SPECIFIED  Final   Special Requests   Final    BOTTLES DRAWN AEROBIC AND ANAEROBIC Blood Culture adequate volume   Culture  Setup Time   Final    GRAM POSITIVE COCCI IN CLUSTERS IN BOTH AEROBIC AND ANAEROBIC BOTTLES CRITICAL RESULT CALLED TO, READ BACK BY AND VERIFIED WITH: PHARMD AUSTIN PAYTES ON 08/17/24 @ 1904 BY DRT    Culture (A)  Final    METHICILLIN RESISTANT STAPHYLOCOCCUS AUREUS SEE SEPARATE REPORT Performed at [facility], [address], [place], [place] [address]    Report Status 08/26/2024 FINAL  Final   Organism ID, Bacteria METHICILLIN RESISTANT STAPHYLOCOCCUS AUREUS  Final       Susceptibility   Methicillin resistant staphylococcus aureus - MIC*    CIPROFLOXACIN >=8 RESISTANT Resistant     ERYTHROMYCIN >=8 RESISTANT Resistant     GENTAMICIN <=0.5 SENSITIVE Sensitive     OXACILLIN >=4 RESISTANT Resistant     TETRACYCLINE <=1 SENSITIVE Sensitive     VANCOMYCIN  <=0.5 SENSITIVE Sensitive     TRIMETH/SULFA 80 RESISTANT Resistant     CLINDAMYCIN <=0.25 SENSITIVE Sensitive     RIFAMPIN <=0.5 SENSITIVE Sensitive     Inducible Clindamycin NEGATIVE Sensitive     LINEZOLID 2 SENSITIVE Sensitive     * METHICILLIN RESISTANT STAPHYLOCOCCUS AUREUS  Blood Culture ID Panel (Reflexed)     Status: Abnormal   Collection Time: 08/17/24  1:05 AM  Result Value Ref Range Status   Enterococcus faecalis NOT DETECTED NOT DETECTED Final   Enterococcus Faecium NOT DETECTED NOT DETECTED Final   Listeria monocytogenes NOT DETECTED NOT DETECTED Final   Staphylococcus species DETECTED (A) NOT DETECTED Final    Comment: CRITICAL RESULT CALLED TO, READ BACK BY AND VERIFIED WITH: PHARMD AUSTIN PAYTES ON 08/17/24 @ 1904 BY DRT    Staphylococcus aureus (BCID) DETECTED (A) NOT DETECTED Final    Comment: Methicillin (oxacillin)-resistant Staphylococcus aureus (MRSA). MRSA is predictably resistant to beta-lactam antibiotics (except ceftaroline). Preferred therapy is vancomycin  unless  clinically contraindicated. Patient requires contact precautions if  hospitalized. CRITICAL RESULT CALLED TO, READ BACK BY AND VERIFIED WITH: PHARMD AUSTIN PAYTES ON 08/17/24 @ 1904 BY DRT    Staphylococcus epidermidis NOT DETECTED NOT DETECTED Final   Staphylococcus lugdunensis NOT DETECTED NOT DETECTED Final   Streptococcus species NOT DETECTED NOT DETECTED Final   Streptococcus agalactiae NOT DETECTED NOT DETECTED Final   Streptococcus pneumoniae NOT DETECTED NOT DETECTED Final   Streptococcus pyogenes NOT DETECTED NOT DETECTED Final   A.calcoaceticus-baumannii NOT DETECTED NOT DETECTED Final   Bacteroides  fragilis NOT DETECTED NOT DETECTED Final   Enterobacterales NOT DETECTED NOT DETECTED Final   Enterobacter cloacae complex NOT DETECTED NOT DETECTED Final   Escherichia coli NOT DETECTED NOT DETECTED Final   Klebsiella aerogenes NOT DETECTED NOT DETECTED Final   Klebsiella oxytoca NOT DETECTED NOT DETECTED Final   Klebsiella pneumoniae NOT DETECTED NOT DETECTED Final   Proteus species NOT DETECTED NOT DETECTED Final   Salmonella species NOT DETECTED NOT DETECTED Final   Serratia marcescens NOT DETECTED NOT DETECTED Final   Haemophilus influenzae NOT DETECTED NOT DETECTED Final   Neisseria meningitidis NOT DETECTED NOT DETECTED Final   Pseudomonas aeruginosa NOT DETECTED NOT DETECTED Final   Stenotrophomonas maltophilia NOT DETECTED NOT DETECTED Final   Candida albicans NOT DETECTED NOT DETECTED Final   Candida auris NOT DETECTED NOT DETECTED Final   Candida glabrata NOT DETECTED NOT DETECTED Final   Candida krusei NOT DETECTED NOT DETECTED Final   Candida parapsilosis NOT DETECTED NOT DETECTED Final   Candida tropicalis NOT DETECTED NOT DETECTED Final   Cryptococcus neoformans/gattii NOT DETECTED NOT DETECTED Final   Meth resistant mecA/C and MREJ DETECTED (A) NOT DETECTED Final    Comment: CRITICAL RESULT CALLED TO, READ BACK BY AND VERIFIED WITH: PHARMD AUSTIN PAYTES ON 08/17/24 @ 1904 BY DRT Performed at [facility], [address], [place], [place] [address]   MIC (1 Drug)-     Status: None   Collection Time: 08/17/24  1:05 AM  Result Value Ref Range Status   Min Inhibitory Conc (1 Drug) Final report  Corrected    Comment: (NOTE) Performed At: [facility] [address] [place], [place] [id number] Jennette Shorter MD Ey:[id number] CORRECTED ON 10/14 AT 1536: PREVIOUSLY REPORTED AS Preliminary report    Source DAPTOMYCIN MIC STAPH AUREUS BLOOD CULTURE  Final    Comment: Performed at [facility], [address], [place], [place] [address]  MIC Result      Status: None   Collection Time: 08/17/24  1:05 AM  Result Value Ref Range Status   Result 1 (MIC) Staphylococcus aureus  Final    Comment: (NOTE) Identification performed by account, not confirmed by this laboratory. Testing performed by gradient elution. DAPTOMYCIN  1.0 ug/mL SUSCEPTIBLE Performed At: [facility] [address] [place], [place] [id number] Jennette Shorter MD Ey:[id number]   Culture, blood (Routine X 2) w Reflex to ID Panel     Status: None   Collection Time: 08/19/24  9:33 AM   Specimen: BLOOD RIGHT ARM  Result Value Ref Range Status   Specimen Description BLOOD RIGHT ARM  Final   Special Requests   Final    BOTTLES DRAWN AEROBIC AND ANAEROBIC Blood Culture results may not be optimal due to an inadequate volume of blood received in culture bottles   Culture   Final    NO GROWTH 5 DAYS Performed at [facility], [address] [place],  [place] [address]    Report Status 08/24/2024 FINAL  Final  Culture, blood (Routine X 2) w Reflex to ID Panel     Status: None   Collection Time: 08/19/24  9:33 AM   Specimen: BLOOD RIGHT HAND  Result Value Ref Range Status   Specimen Description BLOOD RIGHT HAND  Final   Special Requests   Final    BOTTLES DRAWN AEROBIC AND ANAEROBIC Blood Culture results may not be optimal due to an inadequate volume of blood received in culture bottles   Culture   Final    NO GROWTH 5 DAYS Performed at [facility], [address], [place], [place] [address]    Report Status 08/24/2024 FINAL  Final  MRSA Next Gen by PCR, Nasal     Status: Abnormal   Collection Time: 08/23/24  8:00 AM   Specimen: Nasal Mucosa; Nasal Swab  Result Value Ref Range Status   MRSA by PCR Next Gen DETECTED (A) NOT DETECTED Final    Comment: RESULT CALLED TO, READ BACK BY AND VERIFIED WITH: RN EMERSON HALEY [phone] @ 1539 FH (NOTE) The GeneXpert MRSA Assay (FDA approved for NASAL specimens only), is one component of a comprehensive MRSA  colonization surveillance program. It is not intended to diagnose MRSA infection nor to guide or monitor treatment for MRSA infections. Test performance is not FDA approved in patients less than 56 years old. Performed at [facility], [address] [place], [place] [address]     Studies/Results: No results found.    Assessment/Plan:  INTERVAL HISTORY: Worsening erythema after coming off of daptomycin   Principal Problem:   Cellulitis of left lower extremity Active Problems:   Uncontrolled type 2 diabetes mellitus with hyperglycemia, without long-term current use of insulin  (HCC)   S/P BKA (below knee amputation) unilateral, right (HCC)   Tobacco use disorder   Chronic prescription opiate use - sees pain clinic in [place], [place].   GERD (gastroesophageal reflux disease)   CHF (congestive heart failure) (HCC)   COPD (chronic obstructive pulmonary disease) (HCC)   Osteomyelitis (HCC)    Ryan [name] is a 58 y.o. male with with diabetes mellitus prior right below the knee amputation in April 2024 who presented with worsening left foot redness swelling and purulent drainage x 2 months and found to have left foot osteomyelitis he was also bacteremic with MRSA.  He initially refused left below-knee amputation.  He did undergo TEE which failed to show evidence of endocarditis.  He was on daptomycin with plans of doing this followed by doxycycline  but then he eventually consented to below the knee amputation which was performed on August 31, 2024.  Because he had received sufficient IV antibiotics for someone that does not have a endocardial infection or deep infection after his osteomyelitis was removed with the below needed amputation I recommended stopping antibiotics on the 24th, he did receive doxycycline  on the 25th.  In the interval he has had worsening erythema on the lower extremity that is visualizable.  Dr. Harden come and examine the patient that we did not  let Dr. Harden examined the stump surgical site itself.  Apparently the wound vacuum dressing was removed earlier than was desired and he had a fair amount of bleeding around the site.  There apparently is also mention of the patient touching the gauze: Nurse was doing his dressing changes.  #1 Erythema at site of recent BKA  I discussed the case with Dr. Harden and  he felt that this is unusually early for him to have developed a stump infection with his below the knee amputation having been formed on the 22nd and him having also stopped antibiotic only on the 24th if we do not count the doxycycline .  Certainly him developing cellulitis would seem fairly unusual as well although if he is touching or manipulating the wound he certainly could be inoculating the area.  Another explanation for the worsening erythema may be that he is anxious to go home which she has voiced on multiple occasions and may be sitting with his legs hanging off the bed more than he was earlier during this admission.  I have discussed the case with both Dr. Harden and Dr. Vianne I will plan on stopping his daptomycin and observing how he does off of antibiotics.  #2 osteomyelitis: Cured with below the knee amputation  #3 MRSA bacteremia secondary to #2 which should have received sufficient treatment by now endocarditis was excluded  #4 maintain contact precautions for MRSA  I have personally spent 50 minutes involved in face-to-face and non-face-to-face activities for this patient on the day of the visit. Professional time spent includes the following activities: Preparing to see the patient (review of tests), Obtaining and/or reviewing separately obtained history (admission/discharge record), Performing a medically appropriate examination and/or evaluation , Ordering medications/tests/procedures, referring and communicating with other health care professionals, Documenting clinical information in the EMR, Independently  interpreting results (not separately reported), Communicating results to the patient/family/caregiver, Counseling and educating the patient/family/caregiver and Care coordination (not separately reported).   Evaluation of the patient requires complex antimicrobial therapy evaluation, counseling , isolation needs to reduce disease transmission and risk assessment and mitigation.      LOS: 18 days   Jomarie Fleeta Rothman 09/04/2024, 4:35 PM

## 2024-09-04 NOTE — Progress Notes (Signed)
 Patient has refused Infectious disease to check his wound this morning, inquired with Dr. Fleeta Rothman if he wants me to defer dressing change at 0500 so that patient will agree for them to see left BKA  during their rounds, as per DR. [facility], may proceed with this plan. Patient is updated that dressing change will be deferred until ID team sees him so that they can assess the wound properly, patient agreed as planned.

## 2024-09-04 NOTE — Progress Notes (Signed)
 PROGRESS NOTE  [place] FMW:[id number] DOB: 12/19/1965 DOA: 08/16/2024 PCP: [name] [name], NP   LOS: 18 days   Brief Narrative / Interim history: 58 year old M with PMH of DM-2, right BKA in 02/2023 and tobacco use disorder presenting with worsening left foot redness, pain and swelling with purulent drainage for last 2 months and admitted with diabetic foot infection, and L foot osteomyelitis.  He has been following up with [facility], and sent to ED for further evaluation.  Left foot x-ray shows left fifth metatarsal joint changes consistent with osteomyelitis.  Patient was admitted and orthopedics was consulted,  started on empiric antibiotics. Further workup showed MRSA bacteremia.  ID consulted and antibiotic changed to IV daptomycin.  Initially refused left BKA but he later agreed, underwent surgery on Wednesday  Subjective / 24h Interval events: Complains of worsening pain in his left stump, also increased swelling and redness  Assesement and Plan: Principal problem Left foot osteomyelitis, diabetic foot infection, MRSA bacteremia -patient has been having worsening left foot wound over the last couple of months with x-ray concerning for osteomyelitis.  Initial blood cultures showed MRSA and ID was consulted.  Repeat blood cultures were negative, TTE and TEE were negative for vegetations.  ID recommends IV daptomycin until 10/24, 2 total weeks following negative blood cultures on 10/10, followed by p.o. doxycycline  100 mg BID through 11/21.  This was the original plan while he was refusing amputation.  Case discussed with ID again and he is IV antibiotics were stopped on 10/24, however resume again today due to presumed worsening cellulitis with increased redness, warmth, pain - ID and Ortho reengaged today - Continue pain control  Active problems DM2, uncontrolled, with hyperglycemia -continue glargine and sliding scale  Chronic diastolic CHF-LVEF 60 to 65%, appears  euvolemic.  Continue Bumex   COPD-stable, no wheezing  Hyperkalemia-Lokelma x 1, potassium normalized this morning  Tobacco use-nicotine patch  Hyponatremia-mild  Disposition-continues to refuse SNF, will go home once ready  Scheduled Meds:  bumetanide   1 mg Oral Daily   Chlorhexidine  Gluconate Cloth  6 each Topical Daily   docusate sodium   100 mg Oral Daily   gabapentin   800 mg Oral TID   insulin  aspart  0-20 Units Subcutaneous TID WC   insulin  aspart  0-5 Units Subcutaneous QHS   insulin  aspart  15 Units Subcutaneous TID WC   insulin  glargine-yfgn  45 Units Subcutaneous BID   irbesartan  300 mg Oral Daily   mupirocin ointment  1 Application Nasal BID   Continuous Infusions:  DAPTOmycin     PRN Meds:.acetaminophen  **OR** acetaminophen , hydrALAZINE , HYDROmorphone  (DILAUDID ) injection, labetalol , melatonin, metoprolol  tartrate, ondansetron  (ZOFRAN ) IV, oxyCODONE , phenol  Current Outpatient Medications  Medication Instructions   albuterol  (VENTOLIN  HFA) 108 (90 Base) MCG/ACT inhaler 2 puffs, Inhalation, Every 4 hours PRN   ascorbic acid  (VITAMIN C ) 500 mg, Oral, 2 times daily   bisacodyl  (DULCOLAX) 5 mg, Oral, Daily PRN   brompheniramine-pseudoephedrine-DM 30-2-10 MG/5ML syrup 10 mLs, Oral, 4 times daily PRN   bumetanide  (BUMEX ) 1 MG tablet Take 2 mg two times daily for 5 days. Then switch to 1 mg two times daily   celecoxib (CELEBREX) 200 mg, Oral, Daily   collagenase  (SANTYL ) 250 UNIT/GM ointment 1 Application, Topical, Daily, Apply to the affected area daily plus dry dressing   docusate sodium  (COLACE) 100 mg, Oral, Daily   doxycycline  (VIBRA -TABS) 100 mg, Oral, 2 times daily, Start taking on 10/25 after IV antibiotics have been completed  famotidine (PEPCID) 20 mg, Oral, Daily   fenofibrate (TRICOR) 48 mg, Oral, Daily   gabapentin  (NEURONTIN ) 800 mg, Oral, 3 times daily   Glucagon Emergency 1 mg, Intramuscular, As needed   hydrALAZINE  (APRESOLINE ) 25 mg, Oral, 3 times  daily   Insulin  Aspart FlexPen (NOVOLOG ) 10 Units, Subcutaneous, 3 times daily before meals & bedtime, Hold for CBGs < 150 or if patient is not eating.   insulin  glargine-yfgn (SEMGLEE ) 30 Units, Subcutaneous, Every 12 hours, Hold if CBG is < 150   ipratropium-albuterol  (DUONEB) 0.5-2.5 (3) MG/3ML SOLN 3 mLs, Inhalation, Every 4 hours PRN   Jardiance 10 mg, Oral, Daily   melatonin 6 mg, Oral, Daily at bedtime   metFORMIN  (GLUCOPHAGE ) 1,000 mg, Oral, 2 times daily   metoprolol  tartrate (LOPRESSOR ) 12.5 mg, Oral, Daily   naloxone (NARCAN) nasal spray 4 mg/0.1 mL 1 spray, Nasal, Once PRN   oxyCODONE  (ROXICODONE ) 15 mg, Oral, Every 4 hours PRN   pantoprazole  (PROTONIX ) 40 mg, Oral, Daily at bedtime   polyethylene glycol (MIRALAX  / GLYCOLAX ) 17 g, Oral, Daily PRN   rOPINIRole (REQUIP) 1 mg, Oral, 3 times daily   TRELEGY ELLIPTA 100-62.5-25 MCG/ACT AEPB 1 puff, Inhalation, Daily   Diet Orders (From admission, onward)     Start     Ordered   08/31/24 1638  Diet Carb Modified Fluid consistency: Thin; Room service appropriate? Yes  Diet effective now       Question Answer Comment  Calorie Level Medium 1600-2000   Fluid consistency: Thin   Room service appropriate? Yes      08/31/24 1637           DVT prophylaxis: SCD's Start: 08/31/24 1638 SCDs Start: 08/17/24 0224  Lab Results  Component Value Date   PLT 389 09/03/2024      Code Status: Full Code  Family Communication: No family at bedside  Status is: Inpatient Remains inpatient appropriate because: Severity of illness   Level of care: Med-Surg  Consultants:  ID Orthopedic surgery  Objective: Vitals:   09/03/24 1433 09/03/24 2003 09/04/24 0324 09/04/24 0902  BP: 114/67 119/68 126/74 126/82  Pulse: (!) 102 (!) 103 (!) 103 95  Resp: 16 17 17 16   Temp: 98.5 F (36.9 C) 97.8 F (36.6 C) 98.7 F (37.1 C) 98.5 F (36.9 C)  TempSrc:  Oral Oral Oral  SpO2: 97% 95% 95% 100%  Weight:      Height:         Intake/Output Summary (Last 24 hours) at 09/04/2024 0958 Last data filed at 09/04/2024 0500 Gross per 24 hour  Intake 954 ml  Output 1990 ml  Net -1036 ml   Wt Readings from Last 3 Encounters:  08/26/24 100.6 kg  05/19/24 107 kg  03/24/24 106.6 kg    Examination:  Constitutional: NAD Eyes: lids and conjunctivae normal, no scleral icterus ENMT: mmm Neck: normal, supple Respiratory: clear to auscultation bilaterally, no wheezing, no crackles. Cardiovascular: Regular rate and rhythm, no murmurs / rubs / gallops. No LE edema. Abdomen: soft, no distention, no tenderness. Bowel sounds positive.  Skin: Increased redness in the left stump  Data Reviewed: I have independently reviewed following labs and imaging studies   CBC Recent Labs  Lab 08/31/24 0532 09/01/24 0339 09/02/24 0522 09/03/24 0508  WBC 6.9 8.2 12.5* 11.0*  HGB 12.2* 9.9* 9.2* 9.6*  HCT 41.8 33.2* 29.9* 32.1*  PLT 388 353 453* 389  MCV 83.3 82.2 81.0 82.5  MCH 24.3* 24.5* 24.9* 24.7*  MCHC  29.2* 29.8* 30.8 29.9*  RDW 19.9* 19.4* 19.7* 19.7*    Recent Labs  Lab 08/31/24 0532 09/01/24 0339 09/02/24 0522 09/03/24 0508  NA 133* 132* 133* 132*  K 4.9 5.5* 4.2 4.9  CL 100 98 97* 95*  CO2 24 26 27 28   GLUCOSE 217* 369* 119* 107*  BUN 31* 34* 34* 38*  CREATININE 1.06 1.23 1.05 1.10  CALCIUM 9.0 8.6* 8.9 8.8*  AST  --  38 34 37  ALT  --  32 28 29  ALKPHOS  --  130* 109 102  BILITOT  --  0.7 0.5 0.5  ALBUMIN 2.1* 1.9* 2.1* 2.2*  MG 1.9 1.8 1.8 1.8    ------------------------------------------------------------------------------------------------------------------ No results for input(s): CHOL, HDL, LDLCALC, TRIG, CHOLHDL, LDLDIRECT in the last 72 hours.  Lab Results  Component Value Date   HGBA1C 13.2 (H) 08/16/2024   ------------------------------------------------------------------------------------------------------------------ No results for input(s): TSH, T4TOTAL,  T3FREE, THYROIDAB in the last 72 hours.  Invalid input(s): FREET3  Cardiac Enzymes No results for input(s): CKMB, TROPONINI, MYOGLOBIN in the last 168 hours.  Invalid input(s): CK ------------------------------------------------------------------------------------------------------------------ No results found for: BNP  CBG: Recent Labs  Lab 09/03/24 0620 09/03/24 1119 09/03/24 1630 09/03/24 2115 09/04/24 0628  GLUCAP 91 194* 169* 227* 119*   No results found for this or any previous visit (from the past 240 hours).   Radiology Studies: No results found.  Nilda Fendt, MD, PhD [facility]  Between 7 am - 7 pm I am available, please contact me via Amion (for emergencies) or Securechat (non urgent messages)  Between 7 pm - 7 am I am not available, please contact night coverage MD/APP via Amion

## 2024-09-04 NOTE — Progress Notes (Signed)
 Pharmacy Antibiotic Note  Ryan [name] is a 58 y.o. male admitted on 08/16/2024 with cellulitis, s/p MRSA bacteremia treatment (completed daptomycin 10/21), s/p left BKA on 10/22. Patient now experiencing some swelling/worsening of stump site and ID is okay with starting daptomycin. Pharmacy has been consulted for daptomycin dosing.  10/20 CK = 978 >> will obtain another CK and monitor for signs/symptoms of myopathy (CK cutoffs are >1000 with symptoms or >2000 without symptoms)  Plan: - daptomycin IV 700 mg daily  - Monitor baseline and weekly CK, clinical progression, and length of therapy  Height: 6' 1 (185.4 cm) Weight:  (refused to lay down on bed, sat dangling by edge of bed) IBW/kg (Calculated) : 79.9  Temp (24hrs), Avg:98.3 F (36.8 C), Min:97.8 F (36.6 C), Max:98.7 F (37.1 C)  Recent Labs  Lab 08/31/24 0532 09/01/24 0339 09/02/24 0522 09/03/24 0508  WBC 6.9 8.2 12.5* 11.0*  CREATININE 1.06 1.23 1.05 1.10    Estimated Creatinine Clearance: 91.3 mL/min (by C-G formula based on SCr of 1.1 mg/dL).    Allergies  Allergen Reactions   Acetaminophen  Nausea And Vomiting   Thank you for allowing pharmacy to be a part of this patient's care.  Feliciano RAMAN [facility] 09/04/2024 8:26 AM

## 2024-09-04 NOTE — Progress Notes (Signed)
 Orthopedic Tech Progress Note Patient Details:  Ryan [name] [birth year] [id number]  Routine order for 1 BK shrinker called into [facility].  Patient ID: Ryan [name], male   DOB: Aug 21, 1966, 58 y.o.   MRN: [id number]  [name] 09/04/2024, 6:03 PM

## 2024-09-04 NOTE — Progress Notes (Signed)
 Patient ID: Ryan [name], male   DOB: 1966-08-16, 58 y.o.   MRN: [id number] Patient is status post left transtibial amputation.  He does have some redness and swelling of the left lower extremity.  Patient states he does have a Nurse, adult at home.  Recommended considering discharge to skilled nursing.

## 2024-09-05 ENCOUNTER — Other Ambulatory Visit (HOSPITAL_COMMUNITY): Payer: Self-pay

## 2024-09-05 DIAGNOSIS — B9562 Methicillin resistant Staphylococcus aureus infection as the cause of diseases classified elsewhere: Secondary | ICD-10-CM | POA: Diagnosis not present

## 2024-09-05 DIAGNOSIS — Z89511 Acquired absence of right leg below knee: Secondary | ICD-10-CM | POA: Diagnosis not present

## 2024-09-05 DIAGNOSIS — M86272 Subacute osteomyelitis, left ankle and foot: Secondary | ICD-10-CM | POA: Diagnosis not present

## 2024-09-05 DIAGNOSIS — L03116 Cellulitis of left lower limb: Secondary | ICD-10-CM | POA: Diagnosis not present

## 2024-09-05 DIAGNOSIS — L539 Erythematous condition, unspecified: Secondary | ICD-10-CM

## 2024-09-05 DIAGNOSIS — R7881 Bacteremia: Secondary | ICD-10-CM | POA: Diagnosis not present

## 2024-09-05 LAB — COMPREHENSIVE METABOLIC PANEL WITH GFR
ALT: 22 U/L (ref 0–44)
AST: 21 U/L (ref 15–41)
Albumin: 1.9 g/dL — ABNORMAL LOW (ref 3.5–5.0)
Alkaline Phosphatase: 99 U/L (ref 38–126)
Anion gap: 9 (ref 5–15)
BUN: 33 mg/dL — ABNORMAL HIGH (ref 6–20)
CO2: 26 mmol/L (ref 22–32)
Calcium: 8.6 mg/dL — ABNORMAL LOW (ref 8.9–10.3)
Chloride: 99 mmol/L (ref 98–111)
Creatinine, Ser: 0.98 mg/dL (ref 0.61–1.24)
GFR, Estimated: >60 mL/min (ref >60)
Glucose, Bld: 139 mg/dL — ABNORMAL HIGH (ref 70–99)
Potassium: 4.4 mmol/L (ref 3.5–5.1)
Sodium: 134 mmol/L — ABNORMAL LOW (ref 135–145)
Total Bilirubin: 0.5 mg/dL (ref 0.0–1.2)
Total Protein: 6.7 g/dL (ref 6.5–8.1)

## 2024-09-05 LAB — CBC
HCT: 28.6 % — ABNORMAL LOW (ref 39.0–52.0)
Hemoglobin: 8.5 g/dL — ABNORMAL LOW (ref 13.0–17.0)
MCH: 24.8 pg — ABNORMAL LOW (ref 26.0–34.0)
MCHC: 29.7 g/dL — ABNORMAL LOW (ref 30.0–36.0)
MCV: 83.4 fL (ref 80.0–100.0)
Platelets: 384 K/uL (ref 150–400)
RBC: 3.43 MIL/uL — ABNORMAL LOW (ref 4.22–5.81)
RDW: 19.2 % — ABNORMAL HIGH (ref 11.5–15.5)
WBC: 8.6 K/uL (ref 4.0–10.5)
nRBC: 0 % (ref 0.0–0.2)

## 2024-09-05 LAB — GLUCOSE, CAPILLARY
Glucose-Capillary: 118 mg/dL — ABNORMAL HIGH (ref 70–99)
Glucose-Capillary: 148 mg/dL — ABNORMAL HIGH (ref 70–99)
Glucose-Capillary: 147 mg/dL — ABNORMAL HIGH (ref 70–99)
Glucose-Capillary: 142 mg/dL — ABNORMAL HIGH (ref 70–99)
Glucose-Capillary: 124 mg/dL — ABNORMAL HIGH (ref 70–99)

## 2024-09-05 LAB — CK: Total CK: 281 U/L (ref 49–397)

## 2024-09-05 LAB — MAGNESIUM: Magnesium: 1.9 mg/dL (ref 1.7–2.4)

## 2024-09-05 MED ORDER — METHOCARBAMOL 1000 MG/10ML IJ SOLN
500.0000 mg | Freq: Three times a day (TID) | INTRAMUSCULAR | Status: DC | PRN
  Administered 2024-09-05 – 2024-09-07 (×6): 500 mg via INTRAVENOUS
  Filled 2024-09-05 (×6): qty 10

## 2024-09-05 NOTE — Plan of Care (Signed)
  Problem: Education: Goal: Ability to describe self-care measures that may prevent or decrease complications (Diabetes Survival Skills Education) will improve Outcome: Not Progressing Goal: Individualized Educational Video(s) Outcome: Not Progressing   Problem: Metabolic: Goal: Ability to maintain appropriate glucose levels will improve Outcome: Not Progressing   Problem: Nutritional: Goal: Maintenance of adequate nutrition will improve Outcome: Not Progressing Goal: Progress toward achieving an optimal weight will improve Outcome: Not Progressing   Problem: Skin Integrity: Goal: Risk for impaired skin integrity will decrease Outcome: Not Progressing   Problem: Tissue Perfusion: Goal: Adequacy of tissue perfusion will improve Outcome: Not Progressing   Problem: Clinical Measurements: Goal: Will remain free from infection Outcome: Not Progressing   Problem: Pain Managment: Goal: General experience of comfort will improve and/or be controlled Outcome: Not Progressing   Problem: Safety: Goal: Ability to remain free from injury will improve Outcome: Not Progressing

## 2024-09-05 NOTE — Inpatient Diabetes Management (Signed)
 Inpatient Diabetes Program Recommendations  AACE/ADA: New Consensus Statement on Inpatient Glycemic Control (2015)  Target Ranges:  Prepandial:   less than 140 mg/dL      Peak postprandial:   less than 180 mg/dL (1-2 hours)      Critically ill patients:  140 - 180 mg/dL   Lab Results  Component Value Date   GLUCAP 124 (H) 09/05/2024   HGBA1C 13.2 (H) 08/16/2024    Review of Glycemic Control  Diabetes history: DM2 Outpatient Diabetes medications: Semglee  30 Q12H, Novolog  10 QID, Jardiance 10 daily, metfomin 1000 mg BID Current orders for Inpatient glycemic control: Novolog  0-20 TID with meals and 0-5 HS + 15 units TID, Semglee  45 units BID  HgbA1C - 13.2%  Inpatient Diabetes Program Recommendations:    Spoke with pt at bedside regarding his CHO mod diet and what this entails. Pt was not receptive to leaving off regular soda although we did compromise on 1 regular coke/day. Before I could finish my sentence, pt said well maybe 2 or 3 instead of 1. This Coordinator again explained importance of carb-mod diet while hospitalized and this is part of his medicine to control his blood sugars. Pt just shook his head. Doubtful pt will leave off regular sodas in OP setting. Called RN and asked her to give pt 1 soda/day and she will pass this information to oncoming RN.   Continue to follow trends.  Thank you. Shona Brandy, RD, LDN, CDCES Inpatient Diabetes Coordinator [phone]

## 2024-09-05 NOTE — Progress Notes (Signed)
 PROGRESS NOTE  Ryan [name] FMW:[id number] DOB: 04/30/1966 DOA: 08/16/2024 PCP: [name] [name], NP   LOS: 19 days   Brief Narrative / Interim history: 58 year old M with PMH of DM-2, right BKA in 02/2023 and tobacco use disorder presenting with worsening left foot redness, pain and swelling with purulent drainage for last 2 months and admitted with diabetic foot infection, and L foot osteomyelitis.  He has been following up with [facility], and sent to ED for further evaluation.  Left foot x-ray shows left fifth metatarsal joint changes consistent with osteomyelitis.  Patient was admitted and orthopedics was consulted,  started on empiric antibiotics. Further workup showed MRSA bacteremia.  ID consulted and antibiotic changed to IV daptomycin.  Initially refused left BKA but he later agreed, underwent surgery on Wednesday  Subjective / 24h Interval events: Still with worse pain, redness  Assesement and Plan: Principal problem Left foot osteomyelitis, diabetic foot infection, MRSA bacteremia -patient has been having worsening left foot wound over the last couple of months with x-ray concerning for osteomyelitis.  ID, orthopedics consulted, he initially refused amputation but eventually had it done on 10/22.  Hospital course complicated by MRSA bacteremia, TEE negative for endocarditis, repeat blood cultures negative and completed daptomycin on 10/23 - On 10/26 he was found to have increased redness in his stump, ID and orthopedics reengaged.  He is being monitored off antibiotics  Active problems DM2, uncontrolled, with hyperglycemia -continue glargine and sliding scale  CBG (last 3)  Recent Labs    09/04/24 2118 09/05/24 0607 09/05/24 0806  GLUCAP 178* 118* 148*   Chronic diastolic CHF-LVEF 60 to 65%, appears euvolemic.  Continue Bumex   COPD-stable, no wheezing  Hyperkalemia-Lokelma x 1, resume now better  Tobacco use-nicotine patch  Hyponatremia-mild, sodium  134  Disposition-continues to refuse SNF, will go home once ready  Scheduled Meds:  bumetanide   1 mg Oral Daily   docusate sodium   100 mg Oral Daily   gabapentin   800 mg Oral TID   insulin  aspart  0-20 Units Subcutaneous TID WC   insulin  aspart  0-5 Units Subcutaneous QHS   insulin  aspart  15 Units Subcutaneous TID WC   insulin  glargine-yfgn  45 Units Subcutaneous BID   irbesartan  300 mg Oral Daily   Continuous Infusions:   PRN Meds:.acetaminophen  **OR** acetaminophen , hydrALAZINE , HYDROmorphone  (DILAUDID ) injection, labetalol , melatonin, metoprolol  tartrate, ondansetron  (ZOFRAN ) IV, oxyCODONE , phenol  Current Outpatient Medications  Medication Instructions   albuterol  (VENTOLIN  HFA) 108 (90 Base) MCG/ACT inhaler 2 puffs, Inhalation, Every 4 hours PRN   ascorbic acid  (VITAMIN C ) 500 mg, Oral, 2 times daily   bisacodyl  (DULCOLAX) 5 mg, Oral, Daily PRN   brompheniramine-pseudoephedrine-DM 30-2-10 MG/5ML syrup 10 mLs, Oral, 4 times daily PRN   bumetanide  (BUMEX ) 1 MG tablet Take 2 mg two times daily for 5 days. Then switch to 1 mg two times daily   celecoxib (CELEBREX) 200 mg, Oral, Daily   collagenase  (SANTYL ) 250 UNIT/GM ointment 1 Application, Topical, Daily, Apply to the affected area daily plus dry dressing   docusate sodium  (COLACE) 100 mg, Oral, Daily   doxycycline  (VIBRA -TABS) 100 mg, Oral, 2 times daily, Start taking on 10/25 after IV antibiotics have been completed   famotidine (PEPCID) 20 mg, Oral, Daily   fenofibrate (TRICOR) 48 mg, Oral, Daily   gabapentin  (NEURONTIN ) 800 mg, Oral, 3 times daily   Glucagon Emergency 1 mg, Intramuscular, As needed   hydrALAZINE  (APRESOLINE ) 25 mg, Oral, 3 times daily   Insulin   Aspart FlexPen (NOVOLOG ) 10 Units, Subcutaneous, 3 times daily before meals & bedtime, Hold for CBGs < 150 or if patient is not eating.   insulin  glargine-yfgn (SEMGLEE ) 30 Units, Subcutaneous, Every 12 hours, Hold if CBG is < 150   ipratropium-albuterol  (DUONEB)  0.5-2.5 (3) MG/3ML SOLN 3 mLs, Inhalation, Every 4 hours PRN   Jardiance 10 mg, Oral, Daily   melatonin 6 mg, Oral, Daily at bedtime   metFORMIN  (GLUCOPHAGE ) 1,000 mg, Oral, 2 times daily   metoprolol  tartrate (LOPRESSOR ) 12.5 mg, Oral, Daily   naloxone (NARCAN) nasal spray 4 mg/0.1 mL 1 spray, Nasal, Once PRN   oxyCODONE  (ROXICODONE ) 15 mg, Oral, Every 4 hours PRN   pantoprazole  (PROTONIX ) 40 mg, Oral, Daily at bedtime   polyethylene glycol (MIRALAX  / GLYCOLAX ) 17 g, Oral, Daily PRN   rOPINIRole (REQUIP) 1 mg, Oral, 3 times daily   TRELEGY ELLIPTA 100-62.5-25 MCG/ACT AEPB 1 puff, Inhalation, Daily   Diet Orders (From admission, onward)     Start     Ordered   08/31/24 1638  Diet Carb Modified Fluid consistency: Thin; Room service appropriate? Yes  Diet effective now       Question Answer Comment  Calorie Level Medium 1600-2000   Fluid consistency: Thin   Room service appropriate? Yes      08/31/24 1637           DVT prophylaxis: SCD's Start: 08/31/24 1638 SCDs Start: 08/17/24 0224  Lab Results  Component Value Date   PLT 384 09/05/2024      Code Status: Full Code  Family Communication: No family at bedside  Status is: Inpatient Remains inpatient appropriate because: Severity of illness   Level of care: Med-Surg  Consultants:  ID Orthopedic surgery  Objective: Vitals:   09/04/24 1442 09/04/24 2003 09/05/24 0213 09/05/24 0904  BP: 105/68 138/72 123/64 122/68  Pulse: (!) 102 (!) 102 92 96  Resp: 18 18 17 13   Temp: 98.2 F (36.8 C) 98.8 F (37.1 C) 98.1 F (36.7 C) 98.4 F (36.9 C)  TempSrc: Oral Oral Oral Oral  SpO2: 93% 98% 99% 96%  Weight:      Height:        Intake/Output Summary (Last 24 hours) at 09/05/2024 1014 Last data filed at 09/05/2024 0054 Gross per 24 hour  Intake 980 ml  Output 2460 ml  Net -1480 ml   Wt Readings from Last 3 Encounters:  08/26/24 100.6 kg  05/19/24 107 kg  03/24/24 106.6 kg    Examination: Constitutional:  NAD Eyes: lids and conjunctivae normal, no scleral icterus ENMT: mmm Neck: normal, supple Respiratory: clear to auscultation bilaterally, no wheezing, no crackles.  Cardiovascular: Regular rate and rhythm, no murmurs / rubs / gallops. No LE edema. Abdomen: soft, no distention, no tenderness. Bowel sounds positive.   Data Reviewed: I have independently reviewed following labs and imaging studies   CBC Recent Labs  Lab 08/31/24 0532 09/01/24 0339 09/02/24 0522 09/03/24 0508 09/05/24 0558  WBC 6.9 8.2 12.5* 11.0* 8.6  HGB 12.2* 9.9* 9.2* 9.6* 8.5*  HCT 41.8 33.2* 29.9* 32.1* 28.6*  PLT 388 353 453* 389 384  MCV 83.3 82.2 81.0 82.5 83.4  MCH 24.3* 24.5* 24.9* 24.7* 24.8*  MCHC 29.2* 29.8* 30.8 29.9* 29.7*  RDW 19.9* 19.4* 19.7* 19.7* 19.2*    Recent Labs  Lab 08/31/24 0532 09/01/24 0339 09/02/24 0522 09/03/24 0508 09/05/24 0558  NA 133* 132* 133* 132* 134*  K 4.9 5.5* 4.2 4.9 4.4  CL 100  98 97* 95* 99  CO2 24 26 27 28 26   GLUCOSE 217* 369* 119* 107* 139*  BUN 31* 34* 34* 38* 33*  CREATININE 1.06 1.23 1.05 1.10 0.98  CALCIUM 9.0 8.6* 8.9 8.8* 8.6*  AST  --  38 34 37 21  ALT  --  32 28 29 22   ALKPHOS  --  130* 109 102 99  BILITOT  --  0.7 0.5 0.5 0.5  ALBUMIN 2.1* 1.9* 2.1* 2.2* 1.9*  MG 1.9 1.8 1.8 1.8 1.9    ------------------------------------------------------------------------------------------------------------------ No results for input(s): CHOL, HDL, LDLCALC, TRIG, CHOLHDL, LDLDIRECT in the last 72 hours.  Lab Results  Component Value Date   HGBA1C 13.2 (H) 08/16/2024   ------------------------------------------------------------------------------------------------------------------ No results for input(s): TSH, T4TOTAL, T3FREE, THYROIDAB in the last 72 hours.  Invalid input(s): FREET3  Cardiac Enzymes No results for input(s): CKMB, TROPONINI, MYOGLOBIN in the last 168 hours.  Invalid input(s):  CK ------------------------------------------------------------------------------------------------------------------ No results found for: BNP  CBG: Recent Labs  Lab 09/04/24 1108 09/04/24 1607 09/04/24 2118 09/05/24 0607 09/05/24 0806  GLUCAP 127* 153* 178* 118* 148*   No results found for this or any previous visit (from the past 240 hours).   Radiology Studies: No results found.  Nilda Fendt, MD, PhD [facility]  Between 7 am - 7 pm I am available, please contact me via Amion (for emergencies) or Securechat (non urgent messages)  Between 7 pm - 7 am I am not available, please contact night coverage MD/APP via Amion

## 2024-09-05 NOTE — Progress Notes (Signed)
 [facility]  Date of Admission:  08/16/2024      Total days of antibiotics 19   Daptomycin           ASSESSMENT: Ryan [name] is a 58 y.o. male admitted with:   MRSA Bacteremia - BCx (+) 10/07, cleared on 10/10 with daptomycin for treatment. TEE negative. Initially received more broad spectrum therapy for concern over stump infection including LVQ + Metro that ended on 10/14. We outlined a course of Daptomycin through 10/24 with transition to doxycycline  (EOT planned 11/21) though he agreed to transtibial amputation on 10/21 so plan was going to be to stop antibiotics given definitive surgical management.  We received a called back out of concern for new cellulitis to the surgical site.  - bacteremia treated   S/P Transtibial Amputation, Left -  ?Cellulitis -  Dr. Harden following. Photos from dressing change uploaded to chart for his review given Mr Hoefle won't tolerate frequent dressing changes. Appears improved compared to assessment from the weekend with Dr. Fleeta Rothman and Dr. Trixie whom were both at the bedside for assessment. He is having trouble with muscle spasms / pain control. There is no purulence noted. No fevers/leukocytosis. Leg is cool to touch though hyperemic/red in appearance in dependent position on side of bed during dressing change. Seems more post surgical changes not acute infection.  Would monitor off antibiotics I think. Discharge OK from ID standpoint, though still getting IV pain control for dressing changes.  CRP/ESR added tomorrow AM  Would encourage elevation of the stump to help with swelling   Medication Monitoring -  CK 281 - continue weekly checks.   Disposition -  He refuses to go to SNF despite his medical teams recommendations.    PLAN: Stop antibiotics  Follow off abx at this time  CRP/ESR in AM   Principal Problem:   Cellulitis of left lower extremity Active Problems:   Uncontrolled type 2  diabetes mellitus with hyperglycemia, without long-term current use of insulin  (HCC)   S/P BKA (below knee amputation) unilateral, right (HCC)   Tobacco use disorder   Chronic prescription opiate use - sees pain clinic in [place], [place].   GERD (gastroesophageal reflux disease)   CHF (congestive heart failure) (HCC)   COPD (chronic obstructive pulmonary disease) (HCC)   Osteomyelitis (HCC)   Infected wound    bumetanide   1 mg Oral Daily   docusate sodium   100 mg Oral Daily   gabapentin   800 mg Oral TID   insulin  aspart  0-20 Units Subcutaneous TID WC   insulin  aspart  0-5 Units Subcutaneous QHS   insulin  aspart  15 Units Subcutaneous TID WC   insulin  glargine-yfgn  45 Units Subcutaneous BID   irbesartan  300 mg Oral Daily    SUBJECTIVE: Doing OK. Still with bloody drainage from stump. Asking why it's so swollen still.    Review of Systems: Review of Systems  Constitutional: Negative.   Respiratory: Negative.    Cardiovascular:  Positive for leg swelling.  Musculoskeletal:  Positive for joint pain.  Skin:  Negative for rash.    Allergies  Allergen Reactions   Acetaminophen  Nausea And Vomiting    OBJECTIVE: Vitals:   09/04/24 2003 09/05/24 0213 09/05/24 0904 09/05/24 1414  BP: 138/72 123/64 122/68 127/73  Pulse: (!) 102 92 96 98  Resp: 18 17 13 16   Temp: 98.8 F (37.1 C) 98.1 F (36.7 C) 98.4 F (36.9  C)   TempSrc: Oral Oral Oral   SpO2: 98% 99% 96% 94%  Weight:      Height:       Body mass index is 29.26 kg/m.  Physical Exam Vitals reviewed.  Constitutional:      Appearance: Normal appearance. He is not ill-appearing.  HENT:     Head: Normocephalic.     Mouth/Throat:     Mouth: Mucous membranes are moist.     Pharynx: Oropharynx is clear.  Eyes:     General: No scleral icterus. Cardiovascular:     Rate and Rhythm: Normal rate.  Pulmonary:     Effort: Pulmonary effort is normal.  Musculoskeletal:        General: Normal range of motion.      Cervical back: Normal range of motion.  Skin:    Coloration: Skin is not jaundiced or pale.  Neurological:     Mental Status: He is alert and oriented to person, place, and time.  Psychiatric:        Mood and Affect: Mood normal.        Judgment: Judgment normal.            Lab Results Lab Results  Component Value Date   WBC 8.6 09/05/2024   HGB 8.5 (L) 09/05/2024   HCT 28.6 (L) 09/05/2024   MCV 83.4 09/05/2024   PLT 384 09/05/2024    Lab Results  Component Value Date   CREATININE 0.98 09/05/2024   BUN 33 (H) 09/05/2024   NA 134 (L) 09/05/2024   K 4.4 09/05/2024   CL 99 09/05/2024   CO2 26 09/05/2024    Lab Results  Component Value Date   ALT 22 09/05/2024   AST 21 09/05/2024   ALKPHOS 99 09/05/2024   BILITOT 0.5 09/05/2024     Microbiology: No results found for this or any previous visit (from the past 240 hours).   Corean Fireman, MSN, NP-C [facility]  [email or web address] Pager: [phone] Office: [phone] [facility] Main Line: [phone] *Secure Chat Communication Welcome  Total Encounter Time: 10 min

## 2024-09-06 DIAGNOSIS — L03116 Cellulitis of left lower limb: Secondary | ICD-10-CM | POA: Diagnosis not present

## 2024-09-06 LAB — COMPREHENSIVE METABOLIC PANEL WITH GFR
ALT: 23 U/L (ref 0–44)
AST: 22 U/L (ref 15–41)
Albumin: 2.1 g/dL — ABNORMAL LOW (ref 3.5–5.0)
Alkaline Phosphatase: 115 U/L (ref 38–126)
Anion gap: 11 (ref 5–15)
BUN: 36 mg/dL — ABNORMAL HIGH (ref 6–20)
CO2: 26 mmol/L (ref 22–32)
Calcium: 8.6 mg/dL — ABNORMAL LOW (ref 8.9–10.3)
Chloride: 98 mmol/L (ref 98–111)
Creatinine, Ser: 1.15 mg/dL (ref 0.61–1.24)
GFR, Estimated: >60 mL/min (ref >60)
Glucose, Bld: 116 mg/dL — ABNORMAL HIGH (ref 70–99)
Potassium: 4.4 mmol/L (ref 3.5–5.1)
Sodium: 135 mmol/L (ref 135–145)
Total Bilirubin: 0.5 mg/dL (ref 0.0–1.2)
Total Protein: 7.1 g/dL (ref 6.5–8.1)

## 2024-09-06 LAB — CBC
HCT: 29.4 % — ABNORMAL LOW (ref 39.0–52.0)
Hemoglobin: 8.7 g/dL — ABNORMAL LOW (ref 13.0–17.0)
MCH: 24.4 pg — ABNORMAL LOW (ref 26.0–34.0)
MCHC: 29.6 g/dL — ABNORMAL LOW (ref 30.0–36.0)
MCV: 82.4 fL (ref 80.0–100.0)
Platelets: 441 K/uL — ABNORMAL HIGH (ref 150–400)
RBC: 3.57 MIL/uL — ABNORMAL LOW (ref 4.22–5.81)
RDW: 19.3 % — ABNORMAL HIGH (ref 11.5–15.5)
WBC: 8.5 K/uL (ref 4.0–10.5)
nRBC: 0 % (ref 0.0–0.2)

## 2024-09-06 LAB — GLUCOSE, CAPILLARY
Glucose-Capillary: 121 mg/dL — ABNORMAL HIGH (ref 70–99)
Glucose-Capillary: 161 mg/dL — ABNORMAL HIGH (ref 70–99)
Glucose-Capillary: 150 mg/dL — ABNORMAL HIGH (ref 70–99)
Glucose-Capillary: 106 mg/dL — ABNORMAL HIGH (ref 70–99)

## 2024-09-06 LAB — C-REACTIVE PROTEIN: CRP: 7.7 mg/dL — ABNORMAL HIGH (ref <1.0)

## 2024-09-06 LAB — MAGNESIUM: Magnesium: 1.8 mg/dL (ref 1.7–2.4)

## 2024-09-06 LAB — SEDIMENTATION RATE: Sed Rate: 120 mm/h — ABNORMAL HIGH (ref 0–16)

## 2024-09-06 MED ORDER — ENOXAPARIN SODIUM 40 MG/0.4ML IJ SOSY
40.0000 mg | PREFILLED_SYRINGE | INTRAMUSCULAR | Status: DC
  Administered 2024-09-06 – 2024-09-11 (×6): 40 mg via SUBCUTANEOUS
  Filled 2024-09-06 (×7): qty 0.4

## 2024-09-06 NOTE — Progress Notes (Signed)
 PT Cancellation Note  Patient Details Name: PANAYIOTIS RAINVILLE MRN: [id number] DOB: 12/10/1965   Cancelled Treatment:    Reason Eval/Treat Not Completed: (P) Pain limiting ability to participate, pt declining x2 attempts this date. Initially declining due to pain and requesting pain meds. RN aware and giving meds. Returned ~45 mins post medication with pt continuing to refuse, asking this PTA to return in AM. Will check back as schedule allows to continue with PT POC.  [name]. PTA Acute Rehabilitation Services Office: [phone]   [name] 09/06/2024, 4:02 PM

## 2024-09-06 NOTE — Progress Notes (Signed)
 Pt upset that this RN did not wake him for his pain medicine that is PRN. Pt told this RN that he has wasted 45 minutes because he fell asleep and was not awake to call for it. RN explained to pt that his pain meds are as needed and not scheduled medications. Pt has been alternating his Dilaudid  and Oxy and will ask when the next one is due while RN is scanning the medication before being able to even administer it.   Bari HERO Redina Zeller

## 2024-09-06 NOTE — TOC Progression Note (Signed)
 Transition of Care [facility]) - Progression Note    Patient Details  Name: KINNETH FUJIWARA MRN: [id number] Date of Birth: 04/23/1966  [facility] CM/SW Contact  Bridget Cordella Simmonds, LCSW Phone Number: 09/06/2024, 2:53 PM  Clinical Narrative:   CSW spoke with pt regarding SDOH (see separate note), also discussed DC plan.  Pt now stating he is not sure if he wants to DC home or DC to SNF.  If SNF, pt wants facility where he can smoke.  After some discussion, pt gives permission for CSW to start SNF referral process and he will continue to consider if he wants to DC to SNF of home with [facility]   Referral sent out in hub for SNF.     Expected Discharge Plan: Home w Home Health Services Barriers to Discharge: Continued Medical Work up               Expected Discharge Plan and Services In-house Referral: Clinical Social Work Discharge Planning Services: CM Consult Post Acute Care Choice:  (TBD) Living arrangements for the past 2 months: Single Family Home                           HH Arranged: IV Antibiotics, RN HH Agency: [facility] Date HH Agency Contacted: 08/25/24   Representative spoke with at [facility] Agency: Pam   Social Drivers of Health (SDOH) Interventions SDOH Screenings   Food Insecurity: Food Insecurity Present (08/21/2024)  Housing: Low Risk  (08/21/2024)  Transportation Needs: No Transportation Needs (08/21/2024)  Utilities: Not At Risk (08/21/2024)  Financial Resource Strain: High Risk (01/24/2021)   Received from [facility] visits prior to 01/10/2023.  Social Connections: Unknown (08/21/2024)  Tobacco Use: High Risk (08/31/2024)    Readmission Risk Interventions     No data to display

## 2024-09-06 NOTE — Progress Notes (Signed)
 Pt wants to wait on wound care, states it was done around 10am yesterday.   [name]

## 2024-09-06 NOTE — Progress Notes (Signed)
 PROGRESS NOTE  Ryan [name] FMW:[id number] DOB: 1966-07-12 DOA: 08/16/2024 PCP: [name] [name], NP   LOS: 20 days   Brief Narrative / Interim history: 58 year old M with PMH of DM-2, right BKA in 02/2023 and tobacco use disorder presenting with worsening left foot redness, pain and swelling with purulent drainage for last 2 months and admitted with diabetic foot infection, and L foot osteomyelitis.  He has been following up with [facility], and sent to ED for further evaluation.  Left foot x-ray shows left fifth metatarsal joint changes consistent with osteomyelitis.  Patient was admitted and orthopedics was consulted,  started on empiric antibiotics. Further workup showed MRSA bacteremia.  ID consulted and antibiotic changed to IV daptomycin.  Initially refused left BKA but he later agreed, underwent surgery on Wednesday  Subjective / 24h Interval events: Still has pain in the left stump, still feels red and slightly swollen  Assesement and Plan: Principal problem Left foot osteomyelitis, diabetic foot infection, MRSA bacteremia -patient has been having worsening left foot wound over the last couple of months with x-ray concerning for osteomyelitis.  ID, orthopedics consulted, he initially refused amputation but eventually had it done on 10/22.  Hospital course complicated by MRSA bacteremia, TEE negative for endocarditis, repeat blood cultures negative and completed daptomycin on 10/23 - On 10/26 he was found to have increased redness in his stump, ID and orthopedics reengaged, felt to be positional as he is keeping his stump downward for most part of the day.  He is being monitored off antibiotics.  Obtain ultrasound to rule out a DVT in that area  Active problems DM2, uncontrolled, with hyperglycemia -continue glargine and sliding scale  CBG (last 3)  Recent Labs    09/05/24 1701 09/05/24 2121 09/06/24 0606  GLUCAP 142* 124* 121*   Chronic diastolic CHF-LVEF 60 to 65%,  appears euvolemic.  Continue Bumex   COPD-stable, no wheezing  Hyperkalemia-Lokelma x 1, potassium now better  Tobacco use-nicotine patch  Hyponatremia-mild, sodium now normalized  Disposition-initially refusing SNF, however he is considering it again  Scheduled Meds:  bumetanide   1 mg Oral Daily   docusate sodium   100 mg Oral Daily   enoxaparin (LOVENOX) injection  40 mg Subcutaneous Q24H   gabapentin   800 mg Oral TID   insulin  aspart  0-20 Units Subcutaneous TID WC   insulin  aspart  0-5 Units Subcutaneous QHS   insulin  aspart  15 Units Subcutaneous TID WC   insulin  glargine-yfgn  45 Units Subcutaneous BID   irbesartan  300 mg Oral Daily   Continuous Infusions:   PRN Meds:.acetaminophen  **OR** acetaminophen , hydrALAZINE , HYDROmorphone  (DILAUDID ) injection, labetalol , melatonin, methocarbamol (ROBAXIN) injection, metoprolol  tartrate, ondansetron  (ZOFRAN ) IV, oxyCODONE , phenol  Current Outpatient Medications  Medication Instructions   albuterol  (VENTOLIN  HFA) 108 (90 Base) MCG/ACT inhaler 2 puffs, Inhalation, Every 4 hours PRN   ascorbic acid  (VITAMIN C ) 500 mg, Oral, 2 times daily   bisacodyl  (DULCOLAX) 5 mg, Oral, Daily PRN   brompheniramine-pseudoephedrine-DM 30-2-10 MG/5ML syrup 10 mLs, Oral, 4 times daily PRN   bumetanide  (BUMEX ) 1 MG tablet Take 2 mg two times daily for 5 days. Then switch to 1 mg two times daily   celecoxib (CELEBREX) 200 mg, Oral, Daily   collagenase  (SANTYL ) 250 UNIT/GM ointment 1 Application, Topical, Daily, Apply to the affected area daily plus dry dressing   docusate sodium  (COLACE) 100 mg, Oral, Daily   doxycycline  (VIBRA -TABS) 100 mg, Oral, 2 times daily, Start taking on 10/25 after IV antibiotics have  been completed   famotidine (PEPCID) 20 mg, Oral, Daily   fenofibrate (TRICOR) 48 mg, Oral, Daily   gabapentin  (NEURONTIN ) 800 mg, Oral, 3 times daily   Glucagon Emergency 1 mg, Intramuscular, As needed   hydrALAZINE  (APRESOLINE ) 25 mg, Oral, 3  times daily   Insulin  Aspart FlexPen (NOVOLOG ) 10 Units, Subcutaneous, 3 times daily before meals & bedtime, Hold for CBGs < 150 or if patient is not eating.   insulin  glargine-yfgn (SEMGLEE ) 30 Units, Subcutaneous, Every 12 hours, Hold if CBG is < 150   ipratropium-albuterol  (DUONEB) 0.5-2.5 (3) MG/3ML SOLN 3 mLs, Inhalation, Every 4 hours PRN   Jardiance 10 mg, Oral, Daily   melatonin 6 mg, Oral, Daily at bedtime   metFORMIN  (GLUCOPHAGE ) 1,000 mg, Oral, 2 times daily   metoprolol  tartrate (LOPRESSOR ) 12.5 mg, Oral, Daily   naloxone (NARCAN) nasal spray 4 mg/0.1 mL 1 spray, Nasal, Once PRN   oxyCODONE  (ROXICODONE ) 15 mg, Oral, Every 4 hours PRN   pantoprazole  (PROTONIX ) 40 mg, Oral, Daily at bedtime   polyethylene glycol (MIRALAX  / GLYCOLAX ) 17 g, Oral, Daily PRN   rOPINIRole (REQUIP) 1 mg, Oral, 3 times daily   TRELEGY ELLIPTA 100-62.5-25 MCG/ACT AEPB 1 puff, Inhalation, Daily   Diet Orders (From admission, onward)     Start     Ordered   08/31/24 1638  Diet Carb Modified Fluid consistency: Thin; Room service appropriate? Yes  Diet effective now       Question Answer Comment  Calorie Level Medium 1600-2000   Fluid consistency: Thin   Room service appropriate? Yes      08/31/24 1637           DVT prophylaxis: enoxaparin (LOVENOX) injection 40 mg Start: 09/06/24 1200 SCD's Start: 08/31/24 1638 SCDs Start: 08/17/24 0224  Lab Results  Component Value Date   PLT 441 (H) 09/06/2024      Code Status: Full Code  Family Communication: No family at bedside  Status is: Inpatient Remains inpatient appropriate because: Severity of illness   Level of care: Med-Surg  Consultants:  ID Orthopedic surgery  Objective: Vitals:   09/05/24 1414 09/05/24 1952 09/06/24 0627 09/06/24 0823  BP: 127/73 116/78 133/83 126/80  Pulse: 98 (!) 109 95 89  Resp: 16 16    Temp: 98.2 F (36.8 C) 98.7 F (37.1 C)  97.9 F (36.6 C)  TempSrc: Oral Oral    SpO2: 94% 96% 96% 97%  Weight:       Height:        Intake/Output Summary (Last 24 hours) at 09/06/2024 1106 Last data filed at 09/06/2024 0530 Gross per 24 hour  Intake 240 ml  Output 300 ml  Net -60 ml   Wt Readings from Last 3 Encounters:  08/26/24 100.6 kg  05/19/24 107 kg  03/24/24 106.6 kg    Examination: Constitutional: NAD Eyes: lids and conjunctivae normal, no scleral icterus ENMT: mmm Neck: normal, supple Respiratory: clear to auscultation bilaterally, no wheezing, no crackles.  Cardiovascular: Regular rate and rhythm, no murmurs / rubs / gallops.  Abdomen: soft, no distention, no tenderness. Bowel sounds positive.    Data Reviewed: I have independently reviewed following labs and imaging studies   CBC Recent Labs  Lab 09/01/24 0339 09/02/24 0522 09/03/24 0508 09/05/24 0558 09/06/24 0346  WBC 8.2 12.5* 11.0* 8.6 8.5  HGB 9.9* 9.2* 9.6* 8.5* 8.7*  HCT 33.2* 29.9* 32.1* 28.6* 29.4*  PLT 353 453* 389 384 441*  MCV 82.2 81.0 82.5 83.4 82.4  MCH 24.5* 24.9* 24.7* 24.8* 24.4*  MCHC 29.8* 30.8 29.9* 29.7* 29.6*  RDW 19.4* 19.7* 19.7* 19.2* 19.3*    Recent Labs  Lab 09/01/24 0339 09/02/24 0522 09/03/24 0508 09/05/24 0558 09/06/24 0346  NA 132* 133* 132* 134* 135  K 5.5* 4.2 4.9 4.4 4.4  CL 98 97* 95* 99 98  CO2 26 27 28 26 26   GLUCOSE 369* 119* 107* 139* 116*  BUN 34* 34* 38* 33* 36*  CREATININE 1.23 1.05 1.10 0.98 1.15  CALCIUM 8.6* 8.9 8.8* 8.6* 8.6*  AST 38 34 37 21 22  ALT 32 28 29 22 23   ALKPHOS 130* 109 102 99 115  BILITOT 0.7 0.5 0.5 0.5 0.5  ALBUMIN 1.9* 2.1* 2.2* 1.9* 2.1*  MG 1.8 1.8 1.8 1.9 1.8  CRP  --   --   --   --  7.7*    ------------------------------------------------------------------------------------------------------------------ No results for input(s): CHOL, HDL, LDLCALC, TRIG, CHOLHDL, LDLDIRECT in the last 72 hours.  Lab Results  Component Value Date   HGBA1C 13.2 (H) 08/16/2024    ------------------------------------------------------------------------------------------------------------------ No results for input(s): TSH, T4TOTAL, T3FREE, THYROIDAB in the last 72 hours.  Invalid input(s): FREET3  Cardiac Enzymes No results for input(s): CKMB, TROPONINI, MYOGLOBIN in the last 168 hours.  Invalid input(s): CK ------------------------------------------------------------------------------------------------------------------ No results found for: BNP  CBG: Recent Labs  Lab 09/05/24 0806 09/05/24 1225 09/05/24 1701 09/05/24 2121 09/06/24 0606  GLUCAP 148* 147* 142* 124* 121*   No results found for this or any previous visit (from the past 240 hours).   Radiology Studies: No results found.  Nilda Fendt, MD, PhD [facility]  Between 7 am - 7 pm I am available, please contact me via Amion (for emergencies) or Securechat (non urgent messages)  Between 7 pm - 7 am I am not available, please contact night coverage MD/APP via Amion

## 2024-09-06 NOTE — NC FL2 (Signed)
 [place]  MEDICAID FL2 LEVEL OF CARE FORM     IDENTIFICATION  Patient Name: Ryan [name] Birthdate: September 18, 1966 Sex: male Admission Date (Current Location): 08/16/2024  [place] and [place] Number:  [place] [id number] Facility and Address:  [facility] [place], [address] [place], [place] [address]      Provider Number: [id number]  Attending Physician Name and Address:  Trixie Nilda HERO, MD  Relative Name and Phone Number:  Diane Jansky Mother   [phone]    Current Level of Care: Hospital Recommended Level of Care: Skilled Nursing Facility Prior Approval Number:    Date Approved/Denied:   PASRR Number: [id number]  Discharge Plan: SNF    Current Diagnoses: Patient Active Problem List   Diagnosis Date Noted   Erythema 09/05/2024   MRSA bacteremia 09/05/2024   Infected wound 09/04/2024   Osteomyelitis (HCC) 08/17/2024   Acute respiratory failure with hypoxia and hypercapnia (HCC) 01/11/2024   COPD (chronic obstructive pulmonary disease) (HCC) 01/11/2024   CHF (congestive heart failure) (HCC) 10/16/2023   Volume overload 09/12/2023   Arthritis    GERD (gastroesophageal reflux disease)    Sleep apnea    Chronic hypercapnic respiratory failure (HCC) 08/20/2023   Abscess 08/18/2023   Morbid obesity (HCC) 08/18/2023   PVD (peripheral vascular disease) 08/18/2023   Cellulitis of left lower extremity 08/16/2023   Pneumonia due to infectious agent 08/16/2023   Subacute osteomyelitis of right tibia (HCC) 02/27/2023   Dehiscence of amputation stump of right lower extremity (HCC) 02/27/2023   Uncontrolled type 2 diabetes mellitus with hyperglycemia, without long-term current use of insulin  (HCC) 02/24/2023   Left buttock abscess 02/24/2023   Open wound of right shoulder 02/24/2023   Chronic prescription opiate use - sees pain clinic in [place], [place]. 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) 09/03/2020   Cellulitis of right foot 08/29/2020   Right foot infection 08/29/2020   Abscess of right foot 08/29/2020   Right great toe amputee 05/15/2020   Venous insufficiency of right leg 05/15/2020   Other chronic pain 04/23/2020   Tobacco use disorder 03/19/2020   Cigarette smoker 01/01/2019   Diabetic peripheral neuropathy (HCC) 01/01/2019   Uncontrolled type 2 diabetes mellitus with hyperglycemia (HCC) 01/01/2019    Orientation RESPIRATION BLADDER Height & Weight     Self, Time, Situation, Place  Normal Continent Weight:  (refused to lay down on bed, sat dangling by edge of bed) Height:  6' 1 (185.4 cm)  BEHAVIORAL SYMPTOMS/MOOD NEUROLOGICAL BOWEL NUTRITION STATUS      Continent Diet (see discharge summary)  AMBULATORY STATUS COMMUNICATION OF NEEDS Skin   Total Care Verbally Surgical wounds                       Personal Care Assistance Level of Assistance  Bathing, Feeding, Dressing Bathing Assistance: Limited assistance Feeding assistance: Independent Dressing Assistance: Limited assistance     Functional Limitations Info  Sight, Hearing, Speech Sight Info: Adequate Hearing Info: Adequate Speech Info: Adequate    SPECIAL CARE FACTORS FREQUENCY  PT (By licensed PT), OT (By licensed OT)     PT Frequency: 5x week OT Frequency: 5x week            Contractures Contractures  Info: Not present    Additional Factors Info  Code Status, Allergies, Insulin  Sliding Scale, Isolation Precautions Code Status Info: full Allergies Info: Acetaminophen    Insulin  Sliding Scale Info: Novolog : see discharge summary Isolation Precautions Info: contact     Current Medications (09/06/2024):  This is the current hospital active medication list Current  Facility-Administered Medications  Medication Dose Route Frequency Provider Last Rate Last Admin   acetaminophen  (TYLENOL ) tablet 650 mg  650 mg Oral Q6H PRN Gerome Herring M, PA-C       Or   acetaminophen  (TYLENOL ) suppository 650 mg  650 mg Rectal Q6H PRN Gerome Herring M, PA-C       bumetanide  (BUMEX ) tablet 1 mg  1 mg Oral Daily Gerome Herring M, PA-C   1 mg at 09/06/24 9097   docusate sodium  (COLACE) capsule 100 mg  100 mg Oral Daily Gerome Herring M, PA-C   100 mg at 09/06/24 0903   enoxaparin (LOVENOX) injection 40 mg  40 mg Subcutaneous Q24H Gherghe, Costin M, MD   40 mg at 09/06/24 1310   gabapentin  (NEURONTIN ) capsule 800 mg  800 mg Oral TID Gerome Herring HERO, PA-C   800 mg at 09/06/24 9096   hydrALAZINE  (APRESOLINE ) injection 5 mg  5 mg Intravenous Q20 Min PRN Gerome Herring M, PA-C       HYDROmorphone  (DILAUDID ) injection 0.5 mg  0.5 mg Intravenous Q4H PRN Gerome Herring M, PA-C   0.5 mg at 09/06/24 1309   insulin  aspart (novoLOG ) injection 0-20 Units  0-20 Units Subcutaneous TID WC Gerome Herring HERO, PA-C   4 Units at 09/06/24 1310   insulin  aspart (novoLOG ) injection 0-5 Units  0-5 Units Subcutaneous QHS Gerome Herring HERO, PA-C   2 Units at 09/03/24 2137   insulin  aspart (novoLOG ) injection 15 Units  15 Units Subcutaneous TID WC Gerome Herring HERO, PA-C   15 Units at 09/06/24 1310   insulin  glargine-yfgn (SEMGLEE ) injection 45 Units  45 Units Subcutaneous BID Gerome Herring HERO, PA-C   45 Units at 09/06/24 9096   irbesartan (AVAPRO) tablet 300 mg  300 mg Oral Daily Gerome Herring M, PA-C   300 mg at 09/06/24 9096   labetalol  (NORMODYNE ) injection 10 mg  10 mg Intravenous Q10 min PRN Gerome Herring M, PA-C       melatonin tablet 3 mg  3 mg Oral QHS PRN Gerome Herring HERO, PA-C   3 mg at 09/05/24 2140   methocarbamol (ROBAXIN) injection 500 mg  500 mg Intravenous Q8H PRN Gherghe, Costin M, MD   500 mg at 09/06/24 9095   metoprolol  tartrate (LOPRESSOR ) injection 2-5 mg  2-5 mg Intravenous Q2H PRN Gerome Herring M, PA-C       ondansetron  (ZOFRAN ) injection 4 mg  4 mg Intravenous Q6H PRN Gerome Herring M, PA-C       oxyCODONE  (Oxy IR/ROXICODONE ) immediate release tablet 20 mg  20 mg Oral Q4H PRN Gherghe, Costin M, MD   20 mg at 09/06/24 1442   phenol (CHLORASEPTIC) mouth spray 1 spray  1 spray Mouth/Throat PRN Gerome Herring HERO, PA-C         Discharge Medications: Please see discharge summary for a list of discharge medications.  Relevant Imaging Results:  Relevant Lab Results:   Additional Information SSN: [id number]  Bridget Cordella Simmonds, LCSW

## 2024-09-06 NOTE — Progress Notes (Signed)
 CSW spoke with pt regarding SDOH: food. Pt reports he does not run out of food, but can always use help.  Pt states he receives $23/month food stamps.  CSW provided [place] [employer] and pt is interested in this option. Cathlyn Ferry, MSW, LCSW 10/28/20252:12 PM

## 2024-09-07 ENCOUNTER — Inpatient Hospital Stay: Admitting: Internal Medicine

## 2024-09-07 DIAGNOSIS — L03116 Cellulitis of left lower limb: Secondary | ICD-10-CM | POA: Diagnosis not present

## 2024-09-07 LAB — BASIC METABOLIC PANEL WITH GFR
Anion gap: 12 (ref 5–15)
BUN: 33 mg/dL — ABNORMAL HIGH (ref 6–20)
CO2: 24 mmol/L (ref 22–32)
Calcium: 8.8 mg/dL — ABNORMAL LOW (ref 8.9–10.3)
Chloride: 98 mmol/L (ref 98–111)
Creatinine, Ser: 1.06 mg/dL (ref 0.61–1.24)
GFR, Estimated: >60 mL/min (ref >60)
Glucose, Bld: 89 mg/dL (ref 70–99)
Potassium: 4.6 mmol/L (ref 3.5–5.1)
Sodium: 134 mmol/L — ABNORMAL LOW (ref 135–145)

## 2024-09-07 LAB — GLUCOSE, CAPILLARY
Glucose-Capillary: 106 mg/dL — ABNORMAL HIGH (ref 70–99)
Glucose-Capillary: 230 mg/dL — ABNORMAL HIGH (ref 70–99)
Glucose-Capillary: 138 mg/dL — ABNORMAL HIGH (ref 70–99)
Glucose-Capillary: 137 mg/dL — ABNORMAL HIGH (ref 70–99)

## 2024-09-07 LAB — CBC
HCT: 30.1 % — ABNORMAL LOW (ref 39.0–52.0)
Hemoglobin: 8.9 g/dL — ABNORMAL LOW (ref 13.0–17.0)
MCH: 24.7 pg — ABNORMAL LOW (ref 26.0–34.0)
MCHC: 29.6 g/dL — ABNORMAL LOW (ref 30.0–36.0)
MCV: 83.4 fL (ref 80.0–100.0)
Platelets: 463 K/uL — ABNORMAL HIGH (ref 150–400)
RBC: 3.61 MIL/uL — ABNORMAL LOW (ref 4.22–5.81)
RDW: 18.8 % — ABNORMAL HIGH (ref 11.5–15.5)
WBC: 9.3 K/uL (ref 4.0–10.5)
nRBC: 0 % (ref 0.0–0.2)

## 2024-09-07 LAB — MAGNESIUM: Magnesium: 1.9 mg/dL (ref 1.7–2.4)

## 2024-09-07 MED ORDER — OXYCODONE HCL 5 MG PO TABS: 15.0000 mg | ORAL_TABLET | Freq: Four times a day (QID) | ORAL | Status: DC | PRN

## 2024-09-07 MED ORDER — OXYCODONE HCL 5 MG PO TABS: 10.0000 mg | ORAL_TABLET | Freq: Four times a day (QID) | ORAL | Status: DC | PRN

## 2024-09-07 MED ORDER — CELECOXIB 200 MG PO CAPS
200.0000 mg | ORAL_CAPSULE | Freq: Every day | ORAL | Status: DC
Start: 2024-09-07 — End: 2024-09-16
  Administered 2024-09-07 – 2024-09-15 (×9): 200 mg via ORAL
  Filled 2024-09-07 (×10): qty 1

## 2024-09-07 MED ORDER — METHOCARBAMOL 1000 MG/10ML IJ SOLN
500.0000 mg | Freq: Four times a day (QID) | INTRAMUSCULAR | Status: DC | PRN
  Administered 2024-09-07 – 2024-09-09 (×5): 500 mg via INTRAVENOUS
  Filled 2024-09-07 (×5): qty 10

## 2024-09-07 MED ORDER — HYDROMORPHONE HCL 1 MG/ML IJ SOLN
0.5000 mg | Freq: Four times a day (QID) | INTRAMUSCULAR | Status: DC | PRN
  Administered 2024-09-07 – 2024-09-16 (×31): 0.5 mg via INTRAVENOUS
  Filled 2024-09-07 (×33): qty 0.5

## 2024-09-07 MED ORDER — OXYCODONE HCL 5 MG PO TABS
15.0000 mg | ORAL_TABLET | ORAL | Status: DC | PRN
  Administered 2024-09-07 – 2024-09-16 (×47): 15 mg via ORAL
  Filled 2024-09-07 (×48): qty 3

## 2024-09-07 MED ORDER — FENOFIBRATE 54 MG PO TABS
54.0000 mg | ORAL_TABLET | Freq: Every day | ORAL | Status: DC
  Administered 2024-09-07 – 2024-09-15 (×9): 54 mg via ORAL
  Filled 2024-09-07 (×10): qty 1

## 2024-09-07 MED ORDER — PANTOPRAZOLE SODIUM 40 MG PO TBEC
40.0000 mg | DELAYED_RELEASE_TABLET | Freq: Every day | ORAL | Status: DC
  Administered 2024-09-07 – 2024-09-15 (×9): 40 mg via ORAL
  Filled 2024-09-07 (×9): qty 1

## 2024-09-07 NOTE — Plan of Care (Signed)

## 2024-09-07 NOTE — Progress Notes (Signed)
 OT Cancellation Note  Patient Details Name: Ryan [name] MRN: [id number] DOB: 04/28/1966   Cancelled Treatment:    Reason Eval/Treat Not Completed: Other (comment) OT attempted session with Pt premedicated. Pt declined stating he needs additional medications at 9:30. Stated for therapy to return at 9:45. OT to follow-up as appropriate as schedule allows.   Maurilio CROME, OTR/LSABRA  [facility] Acute Rehabilitation  Office: [phone]   Maurilio PARAS Ryan [name] 09/07/2024, 9:10 AM

## 2024-09-07 NOTE — Progress Notes (Signed)
 PROGRESS NOTE  Ryan [name]  DOB: 1966/02/15  PCP: [name] [name], NP FMW:[id number]  DOA: 08/16/2024  LOS: 21 days  Hospital Day: 23  Subjective: Patient was seen and examined this morning. Middle-aged Caucasian male.  Sitting up at the edge of the bed. No family at bedside. Requested to increase Robaxin from every 8 to every 6 hours. Remains afebrile, hemodynamically stable Labs from this morning with sodium 134, hemoglobin 8.9  Brief narrative: ANKITH EDMONSTON is a 58 y.o. male with PMH significant for OSA, DM2, HTN, HLD, chronic venous insufficiencies, CHF, PAD, chronic smoking, COPD, GERD, diabetic neuropathy, s/p right BKA, chronic opiate use Patient follows up at [facility] for chronic left foot wound. 10/7, patient was sent to the ED from wound care center for evaluation of worsening left foot redness, pain and swelling with purulent drainage for last 2 months.  Left foot x-ray showed left fifth metatarsal joint changes consistent with osteomyelitis.   Admitted to [facility]  Orthopedics consulted  Empiric IV antibiotics was started  Blood culture sent on admission grew MRSA  Patient initially refused left BKA but he later agreed, underwent surgery on 10/22.  Assessment and plan: Left foot osteomyelitis Diabetic foot infection Patient initially refused left BKA but he later agreed, underwent left BKA on 10/22 by Dr. Harden On 10/26 he was found to have increased redness in his stump, ID and orthopedics reengaged, felt to be positional as he is keeping his stump downward for most part of the day.  He is being monitored off antibiotics.  Obtain ultrasound to rule out a DVT in that area.   MRSA bacteremia  Blood culture sent on admission grew MRSA  Last blood culture from 10/10 did not show any growth. TEE 10/15 negative for endocarditis Per ID recommendation, completed daptomycin course on 10/23.  Currently on doxycycline   Impaired mobility Bilateral BKA status   Prior right BKA 2024 and now new left BKA Seen by PT.  SNF recommended  Chronic opiate use disorder Diabetic neuropathy PTA meds- Celebrex 200 mg daily, gabapentin  800 mg 3 times daily, oxycodone  15 mg every 4 hours PRN Resume Celebrex, gabapentin .  Reduce oxycodone  to 15 mg every 6 hours.  Minimize use of IV opiate Current pain regimen --- Scheduled: celecoxib 200 mg daily, gabapentin  8 mg 3 times daily --- PRN: Oxycodone  10 mg every 6 hours, as needed IV Dilaudid , Tylenol , Robaxin IV every 6 hours   Type 2 diabetes mellitus uncontrolled A1c 13.2 on 08/16/2024 PTA meds-Semglee , Premeal insulin , Jardiance, metformin  Currently blood sugar is controlled on Semglee  45 units twice daily, SSI/Accu-Cheks. Recent Labs  Lab 09/06/24 1144 09/06/24 1740 09/06/24 2110 09/07/24 0613 09/07/24 1129  GLUCAP 161* 150* 106* 106* 230*   Chronic diastolic CHF HTN Echo 10/10 LVEF 60 to 65%, appears euvolemic.   PTA meds-Toprol  12.5 mg daily, hydralazine  25 mg 3 times daily, Currently on Bumex  1 mg daily, irbesartan 300 mg daily.   Blood pressure and heart rate remain controlled  PAD HLD Continue fenofibrate.  Not on antiplatelet or anticoagulant  Mild hyponatremia In the setting of diuretic use.  Continue to monitor Recent Labs  Lab 09/01/24 0339 09/02/24 0522 09/03/24 0508 09/05/24 0558 09/06/24 0346 09/07/24 0417  NA 132* 133* 132* 134* 135 134*    COPD  chronic daily smoker Respiratory status stable.   Counseled to quit smoking.  But patient prefers to go to SNF that allows him to smoke! Nicotine patch offered  GERD Protonix   OSA ??  CPAP  Mobility:  PT Orders: Active   PT Follow up Rec: Skilled Nursing-Short Term Rehab (<3 Hours/Day)09/07/2024 1000   Goals of care   Code Status: Full Code     DVT prophylaxis:  enoxaparin (LOVENOX) injection 40 mg Start: 09/06/24 1200 SCD's Start: 08/31/24 1638 SCDs Start: 08/17/24 0224   Antimicrobials: Not on antibiotics  currently Fluid: None Consultants: Orthopedics, ID Family Communication: None at bedside  Status: Inpatient Level of care:  Med-Surg   Patient is from: Home Needs to continue in-hospital care: Ultrasound duplex of left amputation stump Anticipated d/c to: Pending clinical course, SNF recommended but patient is still not sure.      Diet:  Diet Order             Diet Carb Modified Fluid consistency: Thin; Room service appropriate? Yes  Diet effective now                   Scheduled Meds:  bumetanide   1 mg Oral Daily   celecoxib  200 mg Oral Daily   docusate sodium   100 mg Oral Daily   enoxaparin (LOVENOX) injection  40 mg Subcutaneous Q24H   fenofibrate  54 mg Oral Daily   gabapentin   800 mg Oral TID   insulin  aspart  0-20 Units Subcutaneous TID WC   insulin  aspart  0-5 Units Subcutaneous QHS   insulin  aspart  15 Units Subcutaneous TID WC   insulin  glargine-yfgn  45 Units Subcutaneous BID   irbesartan  300 mg Oral Daily   pantoprazole   40 mg Oral QHS    PRN meds: acetaminophen  **OR** acetaminophen , hydrALAZINE , HYDROmorphone  (DILAUDID ) injection, labetalol , melatonin, methocarbamol (ROBAXIN) injection, metoprolol  tartrate, ondansetron  (ZOFRAN ) IV, oxyCODONE , phenol   Infusions:    Antimicrobials: Anti-infectives (From admission, onward)    Start     Dose/Rate Route Frequency Ordered Stop   09/04/24 0900  DAPTOmycin (CUBICIN) IVPB 700 mg/100mL premix  Status:  Discontinued        700 mg 200 mL/hr over 30 Minutes Intravenous Daily 09/04/24 0808 09/04/24 1624   09/03/24 1000  doxycycline  (VIBRA -TABS) tablet 100 mg  Status:  Discontinued        100 mg Oral Every 12 hours 08/25/24 0935 09/04/24 0719   09/03/24 0000  doxycycline  (VIBRA -TABS) 100 MG tablet        100 mg Oral 2 times daily 08/25/24 0938 10/01/24 2359   09/02/24 0800  DAPTOmycin (CUBICIN) IVPB 700 mg/138mL premix        8 mg/kg  91.5 kg (Adjusted) 200 mL/hr over 30 Minutes Intravenous Daily  09/01/24 1107 09/02/24 0920   08/31/24 1300  ceFAZolin  (ANCEF ) IVPB 2g/100 mL premix        2 g 200 mL/hr over 30 Minutes Intravenous On call to O.R. 08/31/24 1258 08/31/24 1543   08/31/24 1300  vancomycin  (VANCOREADY) IVPB 1500 mg/300 mL        1,500 mg 150 mL/hr over 120 Minutes Intravenous On call to O.R. 08/31/24 1258 08/31/24 1543   08/20/24 1500  levofloxacin (LEVAQUIN) tablet 750 mg  Status:  Discontinued        750 mg Oral Daily 08/20/24 1410 08/23/24 0916   08/20/24 1500  metroNIDAZOLE (FLAGYL) tablet 500 mg  Status:  Discontinued        500 mg Oral Every 12 hours 08/20/24 1410 08/23/24 0916   08/18/24 1600  DAPTOmycin (CUBICIN) IVPB 700 mg/100mL premix  Status:  Discontinued  6 mg/kg  108.9 kg 200 mL/hr over 30 Minutes Intravenous Daily 08/18/24 1519 08/18/24 1529   08/18/24 1600  DAPTOmycin (CUBICIN) IVPB 700 mg/141mL premix  Status:  Discontinued        8 mg/kg  91.5 kg (Adjusted) 200 mL/hr over 30 Minutes Intravenous Daily 08/18/24 1529 09/01/24 1106   08/18/24 0930  vancomycin  (VANCOCIN ) IVPB 1000 mg/200 mL premix  Status:  Discontinued       Placed in Followed by Linked Group   1,000 mg 200 mL/hr over 60 Minutes Intravenous Every 12 hours 08/18/24 0916 08/18/24 1519   08/18/24 0715  vancomycin  (VANCOREADY) IVPB 1250 mg/250 mL  Status:  Discontinued       Placed in Followed by Linked Group   1,250 mg 166.7 mL/hr over 90 Minutes Intravenous Every 12 hours 08/17/24 1914 08/18/24 0916   08/17/24 1915  vancomycin  (VANCOREADY) IVPB 2000 mg/400 mL       Placed in Followed by Linked Group   2,000 mg 200 mL/hr over 120 Minutes Intravenous  Once 08/17/24 1914 08/17/24 2303   08/17/24 1000  linezolid (ZYVOX) IVPB 600 mg  Status:  Discontinued        600 mg 300 mL/hr over 60 Minutes Intravenous Every 12 hours 08/17/24 0228 08/17/24 1914   08/17/24 1000  piperacillin-tazobactam (ZOSYN) IVPB 3.375 g  Status:  Discontinued        3.375 g 12.5 mL/hr over 240 Minutes  Intravenous Every 8 hours 08/17/24 0303 08/20/24 1409   08/17/24 0200  piperacillin-tazobactam (ZOSYN) IVPB 3.375 g        3.375 g 100 mL/hr over 30 Minutes Intravenous  Once 08/17/24 0158 08/17/24 0216   08/17/24 0200  linezolid (ZYVOX) IVPB 600 mg        600 mg 300 mL/hr over 60 Minutes Intravenous  Once 08/17/24 0158 08/17/24 0415       Objective: Vitals:   09/07/24 0814 09/07/24 0814  BP: 121/71 121/71  Pulse: 95 96  Resp: 17 17  Temp: (!) 97.4 F (36.3 C) (!) 97.4 F (36.3 C)  SpO2: 96% 99%    Intake/Output Summary (Last 24 hours) at 09/07/2024 1329 Last data filed at 09/07/2024 1100 Gross per 24 hour  Intake 240 ml  Output 1700 ml  Net -1460 ml   Filed Weights   08/24/24 0414 08/25/24 0600 08/26/24 0500  Weight: 100.5 kg 99.4 kg 100.6 kg   Weight change:  Body mass index is 29.26 kg/m.   Physical Exam: General exam: Pleasant, middle-aged Caucasian male Skin: No rashes, lesions or ulcers. HEENT: Atraumatic, normocephalic, no obvious bleeding Lungs: Clear to auscultation bilaterally,  CVS: S1, S2, no murmur,   GI/Abd: Soft, nontender, nondistended, bowel sound present,   CNS: Alert, awake, oriented x 3 Psychiatry: Mood appropriate Extremities: Bilateral amputation status.  Left amputation stump area swollen, red  Data Review: I have personally reviewed the laboratory data and studies available.  F/u labs ordered Unresulted Labs (From admission, onward)     Start     Ordered   09/08/24 0500  CBC with Differential/Platelet  Tomorrow morning,   R       Question:  Specimen collection method  Answer:  Lab=Lab collect   09/07/24 1329   09/08/24 0500  Basic metabolic panel with GFR  Tomorrow morning,   R       Question:  Specimen collection method  Answer:  Lab=Lab collect   09/07/24 1329   09/05/24 0500  CK  Weekly,  R     Question:  Specimen collection method  Answer:  Lab=Lab collect   09/04/24 0834            Signed, Chapman Rota, MD [facility] 09/07/2024

## 2024-09-07 NOTE — Plan of Care (Signed)
  Problem: Education: Goal: Ability to describe self-care measures that may prevent or decrease complications (Diabetes Survival Skills Education) will improve Outcome: Progressing Goal: Individualized Educational Video(s) Outcome: Progressing   Problem: Coping: Goal: Ability to adjust to condition or change in health will improve Outcome: Progressing   Problem: Fluid Volume: Goal: Ability to maintain a balanced intake and output will improve Outcome: Progressing   Problem: Health Behavior/Discharge Planning: Goal: Ability to identify and utilize available resources and services will improve Outcome: Progressing Goal: Ability to manage health-related needs will improve Outcome: Progressing   Problem: Nutritional: Goal: Maintenance of adequate nutrition will improve Outcome: Progressing Goal: Progress toward achieving an optimal weight will improve Outcome: Progressing   Problem: Skin Integrity: Goal: Risk for impaired skin integrity will decrease Outcome: Progressing   Problem: Tissue Perfusion: Goal: Adequacy of tissue perfusion will improve Outcome: Progressing   Problem: Education: Goal: Knowledge of General Education information will improve Description: Including pain rating scale, medication(s)/side effects and non-pharmacologic comfort measures Outcome: Progressing   Problem: Clinical Measurements: Goal: Ability to maintain clinical measurements within normal limits will improve Outcome: Progressing Goal: Will remain free from infection Outcome: Progressing Goal: Diagnostic test results will improve Outcome: Progressing Goal: Respiratory complications will improve Outcome: Progressing Goal: Cardiovascular complication will be avoided Outcome: Progressing   Problem: Elimination: Goal: Will not experience complications related to bowel motility Outcome: Progressing Goal: Will not experience complications related to urinary retention Outcome: Progressing    Problem: Education: Goal: Knowledge of the prescribed therapeutic regimen will improve Outcome: Progressing Goal: Ability to verbalize activity precautions or restrictions will improve Outcome: Progressing Goal: Understanding of discharge needs will improve Outcome: Progressing

## 2024-09-07 NOTE — Progress Notes (Signed)
 OT Cancellation Note  Patient Details Name: Ryan [name] MRN: [id number] DOB: 1965-12-29   Cancelled Treatment:    Reason Eval/Treat Not Completed: Other (comment) OT attempted session x2 this date, Pt reporting increased pain in the PM following PT session and respectfully declined second attempt of OT. Pt stated he can bathe and groom himself. Therapist provided clarifying information to Pt regarding SNF information provided by CSW prior to exiting room. OT to follow-up as appropriate and continue per POC.   Maurilio CROME, OTR/LSABRA  [facility] Acute Rehabilitation  Office: [phone]    Maurilio PARAS Otha Monical 09/07/2024, 4:06 PM

## 2024-09-07 NOTE — TOC Progression Note (Addendum)
 Transition of Care [facility]) - Progression Note    Patient Details  Name: Ryan [name] MRN: [id number] Date of Birth: October 15, 1966  [facility] of [facility]) CM/SW Contact  Bridget Cordella Simmonds, LCSW Phone Number: 09/07/2024, 2:29 PM  Clinical Narrative:   Pt initially had one bed offer at [facility], CSW spoke to [facility] to confirm pt could smoke there: they are non smoking, bed offer revoked.  [facility]-CSW reached out to both facilities to see if they can offer with smoking.    1500: [facility] can offer, smoking OK, no beds currently and unclear when bed will be available.  Pt informed of bed offer at [facility], wants to think about it.     Expected Discharge Plan: Home w Home Health Services Barriers to Discharge: Continued Medical Work up               Expected Discharge Plan and Services In-house Referral: Clinical Social Work Discharge Planning Services: CM Consult Post Acute Care Choice:  (TBD) Living arrangements for the past 2 months: Single Family Home                           HH Arranged: IV Antibiotics, RN HH Agency: [facility] Date HH Agency Contacted: 08/25/24   Representative spoke with at [facility] Agency: Pam   Social Drivers of Health (SDOH) Interventions SDOH Screenings   Food Insecurity: Food Insecurity Present (08/21/2024)  Housing: Low Risk  (08/21/2024)  Transportation Needs: No Transportation Needs (08/21/2024)  Utilities: Not At Risk (08/21/2024)  Financial Resource Strain: High Risk (01/24/2021)   Received from [facility] visits prior to 01/10/2023.  Social Connections: Unknown (08/21/2024)  Tobacco Use: High Risk (08/31/2024)    Readmission Risk Interventions     No data to display

## 2024-09-07 NOTE — Progress Notes (Signed)
 Physical Therapy Treatment Patient Details Name: Ryan [name] MRN: [id number] DOB: 1966/05/06 Today's Date: 09/07/2024   History of Present Illness Pt is a 58 y.o male admitted 08/16/24 for hyperglycemia and diabetic foot infection. Left foot x-ray shows fifth metatarsal joint changes consistent with osteomyelitis. MRI demonstrated osteomyelitis/septic arthritis of the underlying left fifth proximal phalanx and mid to distal fifth metatarsal. Blood cultures with MRSA. Repeat blood culture negative.  TTE and TEE negative for vegetation. Pt s/p L BKA and wound vac application 10/22.  PMHx: R BKA, uncontrolled T2DM, chronic pain syndrome, CHF, COPD, PVD, and tobacco use.    PT Comments  Pt premedicated with IV pain medication ~20 minutes prior to PT session. He agreed to a short session as he was anticipating a L residual limb dressing change by RN. Pt completed supine<>sit x2 with supervision. He donned R BKA prosthesis with set-up assistance. Pt completed bed<>transport w/c lateral scoot transfer with close supervision. He declined to attempt sit<>stand, w/c propulsion, or participate in LLE exercises. Educated pt on positioning of L residual limb with emphasis on maintaining knee extension and wearing ampushield. Pt declined d/t foam filler being soiled by blood, notified RN to request replacement with pt reporting he would be agreeable to wear limb guard once that was corrected. Patient will benefit from continued inpatient follow up therapy, <3 hours/day.    If plan is discharge home, recommend the following: A little help with walking and/or transfers;A little help with bathing/dressing/bathroom;Assistance with cooking/housework;Assist for transportation;Help with stairs or ramp for entrance   Can travel by private vehicle     No  Equipment Recommendations  Wheelchair (measurements PT);Wheelchair cushion (measurements PT);BSC/3in1;Hospital bed (removable arm rests; drop arm BSC)     Recommendations for Other Services       Precautions / Restrictions Precautions Precautions: Fall Recall of Precautions/Restrictions: Intact Precaution/Restrictions Comments: Educated pt on the importance of knee position following L BKA to prevent contracture. He reports he cannot tolerate being in bed with his leg straight, maintains L knee flex and declines to wear limb guard Required Braces or Orthoses: Other Brace Other Brace: LLE Limb Guard (Pt declined to wear d/t foam in bottom being soiled by blood, RN notified of request for replacement) Restrictions Weight Bearing Restrictions Per Provider Order: Yes LLE Weight Bearing Per Provider Order: Non weight bearing     Mobility  Bed Mobility Overal bed mobility: Needs Assistance Bed Mobility: Supine to Sit, Sit to Supine     Supine to sit: Supervision, HOB elevated Sit to supine: Supervision, HOB elevated   General bed mobility comments: Pt greeted seated EOB on the left side. He laid down slightly and sat up on the right side to prepare for transfer. Pt then returned to sitting up on left side. Encouraged pt to sit in long sitting instead to maintain L knee in extension with pt declining.    Transfers Overall transfer level: Needs assistance Equipment used: None Transfers: Bed to chair/wheelchair/BSC            Lateral/Scoot Transfers: Supervision General transfer comment: Pt donned R BKA prosthesis with set-up assist. He took increased time to lock in and required support of PT on R limb to aid in securing clicks. He declined transfer to recliner chair and declined to attempt to stand with RW. Pt transferred bed<>transport w/c in room. He reposition w/c close to bed and slid to his right/left acheiving adequate hip clearence. Close supervision for safety.    Ambulation/Gait  General Gait Details: Pt declined to attempt.   Stairs             Investment Banker, Operational mobility:  (Pt declined to attempt)   Tilt Bed    Modified Rankin (Stroke Patients Only)       Balance Overall balance assessment: Needs assistance Sitting-balance support: No upper extremity supported, Feet supported Sitting balance-Leahy Scale: Good Sitting balance - Comments: Pt sat EOB with supervision       Standing balance comment: Not assessed. Pt declined to attempt.                            Communication Communication Communication: No apparent difficulties  Cognition Arousal: Alert Behavior During Therapy: WFL for tasks assessed/performed   PT - Cognitive impairments: No family/caregiver present to determine baseline, Safety/Judgement, Problem solving                         Following commands: Intact      Cueing Cueing Techniques: Verbal cues  Exercises      General Comments General comments (skin integrity, edema, etc.): L residual limb redness and swelling. RN planning to change dressing      Pertinent Vitals/Pain Pain Assessment Pain Assessment: Faces Faces Pain Scale: Hurts little more Pain Location: Generalized Pain Descriptors / Indicators: Discomfort, Aching Pain Intervention(s): Premedicated before session, Monitored during session    Home Living                          Prior Function            PT Goals (current goals can now be found in the care plan section) Acute Rehab PT Goals PT Goal Formulation: With patient Time For Goal Achievement: 09/15/24 Potential to Achieve Goals: Good Progress towards PT goals: Progressing toward goals    Frequency    Min 2X/week      PT Plan      Co-evaluation              AM-PAC PT 6 Clicks Mobility   Outcome Measure  Help needed turning from your back to your side while in a flat bed without using bedrails?: A Little Help needed moving from lying on your back to sitting on the side of a flat bed without using bedrails?: A  Little Help needed moving to and from a bed to a chair (including a wheelchair)?: A Little Help needed standing up from a chair using your arms (e.g., wheelchair or bedside chair)?: Total Help needed to walk in hospital room?: Total Help needed climbing 3-5 steps with a railing? : Total 6 Click Score: 12    End of Session Equipment Utilized During Treatment: Other (comment) (Pt declined gait belt) Activity Tolerance: Patient tolerated treatment well;Other (comment) (Treatment limited secondary to pt's willingess to participate) Patient left: in bed;with call bell/phone within reach Nurse Communication: Mobility status PT Visit Diagnosis: Difficulty in walking, not elsewhere classified (R26.2);Pain;Other abnormalities of gait and mobility (R26.89);Unsteadiness on feet (R26.81) Pain - Right/Left: Left Pain - part of body: Knee;Leg     Time: 8964-8944 PT Time Calculation (min) (ACUTE ONLY): 20 min  Charges:    $Therapeutic Activity: 8-22 mins PT General Charges $$ ACUTE PT VISIT: 1 Visit                     Randall SAUNDERS,  PT, DPT Acute Rehabilitation Services Office: [phone] Secure Chat Preferred  Ryan [name] 09/07/2024, 11:12 AM

## 2024-09-07 NOTE — Progress Notes (Signed)
 Patient not pleased when this nurse told him that he cannot have another regular coke to consume. Reminded him that as per note of diabetic coordinator last 10/27 that he can only have 1 can of coke per day. Patient insists that its 2 cans per day, patient was educated that controlling his blood sugar is priority and will help him with wound healing. Hospital and staff are here to help him recover and not help him get worst. Offered coke Zero that he refused as well. Of note a can of regular coke is visible at bedside table as this nurse is having this conversation with patient.

## 2024-09-07 NOTE — Progress Notes (Signed)
 Nutrition Follow-up  DOCUMENTATION CODES:   Not applicable  INTERVENTION:  Continue Carb modified diet Encourage PO intake  Insulin  regimen per Diabetes Management team  Obtain updated bed weight (post amputation)    NUTRITION DIAGNOSIS:   Increased nutrient needs related to post-op healing as evidenced by  (s/p BKA). - ongoing    GOAL:   Patient will meet greater than or equal to 90% of their needs - met with PO    MONITOR:   PO intake  REASON FOR ASSESSMENT:   Consult Assessment of nutrition requirement/status, Diet education, Wound healing  ASSESSMENT:   PMH of DM-2, right BKA in 02/2023 and tobacco use disorder presenting with worsening left foot redness, pain and swelling with purulent drainage for last 2 months and admitted with diabetic foot infection, and L foot osteomyelitis.  Patient seen in room sleeping, easily arousable. Pt reports good intake of meals, 100% intake documented in flowsheet. No reports of N/V/C/D. Disposition is for SNF once medically stable if patient agreeable.   Admit weight: 108.9 kg (stated weight, likely inaccurate) Current weight: 100.6 kg pre left BKA  Nutritionally Relevant Medications: colace,  SSI 0-20 units TID,  SSI 0-5 units daily, Novolog  15 units TID, Semglee  45 units BID  Labs Reviewed: Sodium 134, BUN 33, CBG 106-230  NUTRITION - FOCUSED PHYSICAL EXAM:  Flowsheet Row Most Recent Value  Orbital Region No depletion  Upper Arm Region No depletion  Thoracic and Lumbar Region No depletion  Buccal Region No depletion  Temple Region No depletion  Clavicle Bone Region No depletion  Clavicle and Acromion Bone Region Mild depletion  Scapular Bone Region No depletion  Dorsal Hand No depletion  Patellar Region Mild depletion  Anterior Thigh Region Mild depletion  Posterior Calf Region Unable to assess  [R BKA, pt c/o of pain in left leg after foot ampuation did not want assessed]  Hair Reviewed  Eyes Reviewed  Mouth  Reviewed  Skin Reviewed  Nails Reviewed    Diet Order:   Diet Order             Diet Carb Modified Fluid consistency: Thin; Room service appropriate? Yes  Diet effective now                   EDUCATION NEEDS:   Education needs have been addressed  Skin:  Skin Assessment: Skin Integrity Issues: Skin Integrity Issues:: Diabetic Ulcer Diabetic Ulcer: left foot, s/p amputation  Last BM:  10/26  Height:   Ht Readings from Last 1 Encounters:  08/16/24 6' 1 (1.854 m)    Weight:   Wt Readings from Last 1 Encounters:  08/26/24 100.6 kg    Ideal Body Weight:  85.5 kg adj for BKAs   BMI:  Body mass index is 29.26 kg/m.  Estimated Nutritional Needs:   Kcal:  7849-7449  Protein:  103-128 g  Fluid:  2.1-2.5L/d  Madalyn Potters, MS, RD, LDN Clinical Dietitian  Contact via secure chat. If unavailable, use group chat RD Inpatient.

## 2024-09-07 NOTE — Progress Notes (Signed)
 Patient refused dressing change at this time and requested to do it around 9 am or 10 am in the morning.

## 2024-09-08 ENCOUNTER — Inpatient Hospital Stay (HOSPITAL_COMMUNITY)

## 2024-09-08 DIAGNOSIS — M7989 Other specified soft tissue disorders: Secondary | ICD-10-CM

## 2024-09-08 DIAGNOSIS — L03116 Cellulitis of left lower limb: Secondary | ICD-10-CM | POA: Diagnosis not present

## 2024-09-08 LAB — CBC WITH DIFFERENTIAL/PLATELET
Abs Immature Granulocytes: 0.04 K/uL (ref 0.00–0.07)
Basophils Absolute: 0.1 K/uL (ref 0.0–0.1)
Basophils Relative: 1 %
Eosinophils Absolute: 0.3 K/uL (ref 0.0–0.5)
Eosinophils Relative: 4 %
HCT: 27.6 % — ABNORMAL LOW (ref 39.0–52.0)
Hemoglobin: 8.2 g/dL — ABNORMAL LOW (ref 13.0–17.0)
Immature Granulocytes: 1 %
Lymphocytes Relative: 27 %
Lymphs Abs: 2.2 K/uL (ref 0.7–4.0)
MCH: 24.7 pg — ABNORMAL LOW (ref 26.0–34.0)
MCHC: 29.7 g/dL — ABNORMAL LOW (ref 30.0–36.0)
MCV: 83.1 fL (ref 80.0–100.0)
Monocytes Absolute: 1.1 K/uL — ABNORMAL HIGH (ref 0.1–1.0)
Monocytes Relative: 14 %
Neutro Abs: 4.4 K/uL (ref 1.7–7.7)
Neutrophils Relative %: 53 %
Platelets: 421 K/uL — ABNORMAL HIGH (ref 150–400)
RBC: 3.32 MIL/uL — ABNORMAL LOW (ref 4.22–5.81)
RDW: 18.8 % — ABNORMAL HIGH (ref 11.5–15.5)
WBC: 8.1 K/uL (ref 4.0–10.5)
nRBC: 0 % (ref 0.0–0.2)

## 2024-09-08 LAB — GLUCOSE, CAPILLARY
Glucose-Capillary: 75 mg/dL (ref 70–99)
Glucose-Capillary: 117 mg/dL — ABNORMAL HIGH (ref 70–99)
Glucose-Capillary: 133 mg/dL — ABNORMAL HIGH (ref 70–99)
Glucose-Capillary: 89 mg/dL (ref 70–99)
Glucose-Capillary: 197 mg/dL — ABNORMAL HIGH (ref 70–99)

## 2024-09-08 LAB — BASIC METABOLIC PANEL WITH GFR
Anion gap: 9 (ref 5–15)
BUN: 32 mg/dL — ABNORMAL HIGH (ref 6–20)
CO2: 25 mmol/L (ref 22–32)
Calcium: 8.5 mg/dL — ABNORMAL LOW (ref 8.9–10.3)
Chloride: 99 mmol/L (ref 98–111)
Creatinine, Ser: 1.04 mg/dL (ref 0.61–1.24)
GFR, Estimated: >60 mL/min (ref >60)
Glucose, Bld: 78 mg/dL (ref 70–99)
Potassium: 4.5 mmol/L (ref 3.5–5.1)
Sodium: 133 mmol/L — ABNORMAL LOW (ref 135–145)

## 2024-09-08 NOTE — Progress Notes (Signed)
 Left lower extremity venous  has been completed. Refer to [facility] under chart review to view preliminary results.   09/08/2024  4:06 PM Lillyann Ahart, Ricka BIRCH

## 2024-09-08 NOTE — Progress Notes (Signed)
 PROGRESS NOTE  Ryan [name]  DOB: Nov 28, 1965  PCP: [name] [name], NP FMW:[id number]  DOA: 08/16/2024  LOS: 22 days  Hospital Day: 24  Subjective: Patient was seen and examined this morning. Propped up in bed.  Not in distress Afebrile, hemodynamically stable, breathing on room air Blood sugar 75 this morning, 117 after breakfast Labs from this morning sodium 133, hemoglobin 8.2  Brief narrative: Ryan [name] is a 58 y.o. male with PMH significant for OSA, DM2, HTN, HLD, chronic venous insufficiencies, CHF, PAD, chronic smoking, COPD, GERD, diabetic neuropathy, s/p right BKA, chronic opiate use Patient follows up at [facility] for chronic left foot wound. 10/7, patient was sent to the ED from wound care center for evaluation of worsening left foot redness, pain and swelling with purulent drainage for last 2 months.  Left foot x-ray showed left fifth metatarsal joint changes consistent with osteomyelitis.   Admitted to [facility]  Orthopedics consulted  Empiric IV antibiotics was started  Blood culture sent on admission grew MRSA  Patient initially refused left BKA but he later agreed, underwent surgery on 10/22.  Assessment and plan: Left foot osteomyelitis Diabetic foot infection Patient initially refused left BKA but he later agreed, underwent left BKA on 10/22 by Dr. Harden On 10/26 he was found to have increased redness in his stump, ID and orthopedics reengaged, felt to be positional as he is keeping his stump downward for most part of the day.  He is being monitored off antibiotics.  Pending ultrasound to rule out a DVT in that area.   MRSA bacteremia  Blood culture sent on admission grew MRSA  Last blood culture from 10/10 did not show any growth. TEE 10/15 negative for endocarditis Per ID recommendation, completed daptomycin course on 10/23.  Currently not on antibiotics  Impaired mobility Bilateral BKA status  Prior right BKA 2024 and now new left  BKA Seen by PT.  SNF recommended  Chronic opiate use disorder Diabetic neuropathy PTA meds- Celebrex 200 mg daily, gabapentin  800 mg 3 times daily, oxycodone  15 mg every 4 hours PRN Current pain regimen --- Scheduled: celecoxib 200 mg daily, gabapentin  8 mg 3 times daily --- PRN: Oxycodone  15 mg every 6 hours, as needed IV Dilaudid , Tylenol , Robaxin IV every 6 hours   Type 2 diabetes mellitus uncontrolled A1c 13.2 on 08/16/2024 PTA meds-Semglee , Premeal insulin , Jardiance, metformin  Currently blood sugar is controlled on Semglee  45 units twice daily, scheduled Premeal aspart 15 units 3 times daily, SSI/Accu-Cheks. This morning, it seems his blood sugar did not rise from breakfast.  We can skip the scheduled insulin  Consult diabetes coordinator Recent Labs  Lab 09/07/24 1620 09/07/24 2035 09/08/24 0632 09/08/24 0853 09/08/24 1116  GLUCAP 138* 137* 75 117* 133*   Chronic diastolic CHF HTN Echo 10/10 LVEF 60 to 65%, appears euvolemic.   PTA meds-Toprol  12.5 mg daily, hydralazine  25 mg 3 times daily, Currently on Bumex  1 mg daily, irbesartan 300 mg daily.   Blood pressure and heart rate remain controlled  PAD HLD Continue fenofibrate.  Not on antiplatelet or anticoagulant  Mild hyponatremia In the setting of diuretic use.  Continue to monitor Recent Labs  Lab 09/02/24 0522 09/03/24 0508 09/05/24 0558 09/06/24 0346 09/07/24 0417 09/08/24 0422  NA 133* 132* 134* 135 134* 133*    COPD  chronic daily smoker Respiratory status stable.   Counseled to quit smoking.  But patient prefers to go to SNF that allows him to smoke! Nicotine patch offered  GERD Protonix   OSA Ordered nocturnal CPAP.  He said he uses it at home  Mobility:  PT Orders: Active   PT Follow up Rec: Skilled Nursing-Short Term Rehab (<3 Hours/Day)09/07/2024 1000   Goals of care   Code Status: Full Code     DVT prophylaxis:  enoxaparin (LOVENOX) injection 40 mg Start: 09/06/24 1200 SCD's  Start: 08/31/24 1638 SCDs Start: 08/17/24 0224   Antimicrobials: Not on antibiotics currently Fluid: None Consultants: Orthopedics, ID Family Communication: None at bedside  Status: Inpatient Level of care:  Med-Surg   Patient is from: Home Needs to continue in-hospital care: Ultrasound duplex of left amputation stump Anticipated d/c to: Pending clinical course, SNF recommended       Diet:  Diet Order             Diet Carb Modified Fluid consistency: Thin; Room service appropriate? Yes  Diet effective now                   Scheduled Meds:  bumetanide   1 mg Oral Daily   celecoxib  200 mg Oral Daily   docusate sodium   100 mg Oral Daily   enoxaparin (LOVENOX) injection  40 mg Subcutaneous Q24H   fenofibrate  54 mg Oral Daily   gabapentin   800 mg Oral TID   insulin  aspart  0-20 Units Subcutaneous TID WC   insulin  aspart  0-5 Units Subcutaneous QHS   insulin  aspart  15 Units Subcutaneous TID WC   insulin  glargine-yfgn  45 Units Subcutaneous BID   irbesartan  300 mg Oral Daily   pantoprazole   40 mg Oral QHS    PRN meds: acetaminophen  **OR** acetaminophen , hydrALAZINE , HYDROmorphone  (DILAUDID ) injection, labetalol , melatonin, methocarbamol (ROBAXIN) injection, metoprolol  tartrate, ondansetron  (ZOFRAN ) IV, oxyCODONE , phenol   Infusions:    Antimicrobials: Anti-infectives (From admission, onward)    Start     Dose/Rate Route Frequency Ordered Stop   09/04/24 0900  DAPTOmycin (CUBICIN) IVPB 700 mg/100mL premix  Status:  Discontinued        700 mg 200 mL/hr over 30 Minutes Intravenous Daily 09/04/24 0808 09/04/24 1624   09/03/24 1000  doxycycline  (VIBRA -TABS) tablet 100 mg  Status:  Discontinued        100 mg Oral Every 12 hours 08/25/24 0935 09/04/24 0719   09/03/24 0000  doxycycline  (VIBRA -TABS) 100 MG tablet        100 mg Oral 2 times daily 08/25/24 0938 10/01/24 2359   09/02/24 0800  DAPTOmycin (CUBICIN) IVPB 700 mg/179mL premix        8 mg/kg  91.5 kg  (Adjusted) 200 mL/hr over 30 Minutes Intravenous Daily 09/01/24 1107 09/02/24 0920   08/31/24 1300  ceFAZolin  (ANCEF ) IVPB 2g/100 mL premix        2 g 200 mL/hr over 30 Minutes Intravenous On call to O.R. 08/31/24 1258 08/31/24 1543   08/31/24 1300  vancomycin  (VANCOREADY) IVPB 1500 mg/300 mL        1,500 mg 150 mL/hr over 120 Minutes Intravenous On call to O.R. 08/31/24 1258 08/31/24 1543   08/20/24 1500  levofloxacin (LEVAQUIN) tablet 750 mg  Status:  Discontinued        750 mg Oral Daily 08/20/24 1410 08/23/24 0916   08/20/24 1500  metroNIDAZOLE (FLAGYL) tablet 500 mg  Status:  Discontinued        500 mg Oral Every 12 hours 08/20/24 1410 08/23/24 0916   08/18/24 1600  DAPTOmycin (CUBICIN) IVPB 700 mg/100mL premix  Status:  Discontinued  6 mg/kg  108.9 kg 200 mL/hr over 30 Minutes Intravenous Daily 08/18/24 1519 08/18/24 1529   08/18/24 1600  DAPTOmycin (CUBICIN) IVPB 700 mg/154mL premix  Status:  Discontinued        8 mg/kg  91.5 kg (Adjusted) 200 mL/hr over 30 Minutes Intravenous Daily 08/18/24 1529 09/01/24 1106   08/18/24 0930  vancomycin  (VANCOCIN ) IVPB 1000 mg/200 mL premix  Status:  Discontinued       Placed in Followed by Linked Group   1,000 mg 200 mL/hr over 60 Minutes Intravenous Every 12 hours 08/18/24 0916 08/18/24 1519   08/18/24 0715  vancomycin  (VANCOREADY) IVPB 1250 mg/250 mL  Status:  Discontinued       Placed in Followed by Linked Group   1,250 mg 166.7 mL/hr over 90 Minutes Intravenous Every 12 hours 08/17/24 1914 08/18/24 0916   08/17/24 1915  vancomycin  (VANCOREADY) IVPB 2000 mg/400 mL       Placed in Followed by Linked Group   2,000 mg 200 mL/hr over 120 Minutes Intravenous  Once 08/17/24 1914 08/17/24 2303   08/17/24 1000  linezolid (ZYVOX) IVPB 600 mg  Status:  Discontinued        600 mg 300 mL/hr over 60 Minutes Intravenous Every 12 hours 08/17/24 0228 08/17/24 1914   08/17/24 1000  piperacillin-tazobactam (ZOSYN) IVPB 3.375 g  Status:   Discontinued        3.375 g 12.5 mL/hr over 240 Minutes Intravenous Every 8 hours 08/17/24 0303 08/20/24 1409   08/17/24 0200  piperacillin-tazobactam (ZOSYN) IVPB 3.375 g        3.375 g 100 mL/hr over 30 Minutes Intravenous  Once 08/17/24 0158 08/17/24 0216   08/17/24 0200  linezolid (ZYVOX) IVPB 600 mg        600 mg 300 mL/hr over 60 Minutes Intravenous  Once 08/17/24 0158 08/17/24 0415       Objective: Vitals:   09/08/24 0536 09/08/24 0803  BP: 124/65 122/69  Pulse: 84 93  Resp: 18 16  Temp: 98 F (36.7 C) 98.5 F (36.9 C)  SpO2: 97% 97%    Intake/Output Summary (Last 24 hours) at 09/08/2024 1432 Last data filed at 09/08/2024 0819 Gross per 24 hour  Intake 980 ml  Output 3090 ml  Net -2110 ml   Filed Weights   08/26/24 0500 09/07/24 1500  Weight: 100.6 kg 107.9 kg   Weight change:  Body mass index is 31.38 kg/m.   Physical Exam: General exam: Pleasant, middle-aged Caucasian male Skin: No rashes, lesions or ulcers. HEENT: Atraumatic, normocephalic, no obvious bleeding Lungs: Clear to auscultation bilaterally,  CVS: S1, S2, no murmur,   GI/Abd: Soft, nontender, nondistended, bowel sound present,   CNS: Alert, awake, oriented x 3 Psychiatry: Mood appropriate Extremities: Bilateral amputation status.  Left amputation stump area swollen, red.  Dressing in place.  Data Review: I have personally reviewed the laboratory data and studies available.  F/u labs ordered Unresulted Labs (From admission, onward)     Start     Ordered   09/05/24 0500  CK  Weekly,   R     Question:  Specimen collection method  Answer:  Lab=Lab collect   09/04/24 0834            Signed, Chapman Rota, MD [facility] 09/08/2024

## 2024-09-08 NOTE — Inpatient Diabetes Management (Signed)
 Inpatient Diabetes Program Recommendations  AACE/ADA: New Consensus Statement on Inpatient Glycemic Control (  Target Ranges:  Prepandial:   less than 140 mg/dL      Peak postprandial:   less than 180 mg/dL (1-2 hours)      Critically ill patients:  140 - 180 mg/dL   Lab Results  Component Value Date   GLUCAP 117 (H) 09/08/2024   HGBA1C 13.2 (H) 08/16/2024    Latest Reference Range & Units 09/07/24 11:29 09/07/24 16:20 09/07/24 20:35 09/08/24 06:32 09/08/24 08:53  Glucose-Capillary 70 - 99 mg/dL 769 (H) 861 (H) 862 (H) 75 117 (H)    Review of Glycemic Control  Diabetes history: DM2  Outpatient Diabetes medications:  Semglee  30 Q12H Novolog  10 QID Jardiance 10 daily metfomin 1000 mg BID  Current orders for Inpatient glycemic control:  Novolog  0-20 TID + 0-5 units at bedtime   Novolog  15 units TID with meals  Semglee  45 units BID  Inpatient Diabetes Program Recommendations:  Noted consult for Diabetes Coordination Discharge Recommendations.    Discharge Recommendations: Other recommendations: Metformin  1,000mg  BID Long acting recommendations: Insulin  Glargine-yfgn (SEMGLEE ) Pen 45 units BID  Short acting recommendations:  Meal coverage ONLY Insulin  aspart (NOVOLOG ) FlexPen  15 units TID with meals   Hypoglycemia treatment recommendations: GVoke 1mg  Supply/Referral recommendations: Glucometer Test strips Lancet device Lancets Pen needles - standard   Use Adult Diabetes Insulin  Treatment Post Discharge order set.  Thanks,  Lavanda Search, RN, MSN, [facility]  Inpatient Diabetes Coordinator  Pager [phone] (8a-5p)

## 2024-09-08 NOTE — Plan of Care (Signed)
  Problem: Education: Goal: Ability to describe self-care measures that may prevent or decrease complications (Diabetes Survival Skills Education) will improve Outcome: Progressing Goal: Individualized Educational Video(s) Outcome: Progressing   Problem: Coping: Goal: Ability to adjust to condition or change in health will improve Outcome: Progressing   Problem: Fluid Volume: Goal: Ability to maintain a balanced intake and output will improve Outcome: Progressing   Problem: Health Behavior/Discharge Planning: Goal: Ability to identify and utilize available resources and services will improve Outcome: Progressing Goal: Ability to manage health-related needs will improve Outcome: Progressing   Problem: Nutritional: Goal: Maintenance of adequate nutrition will improve Outcome: Progressing Goal: Progress toward achieving an optimal weight will improve Outcome: Progressing   Problem: Health Behavior/Discharge Planning: Goal: Ability to manage health-related needs will improve Outcome: Progressing   Problem: Clinical Measurements: Goal: Ability to maintain clinical measurements within normal limits will improve Outcome: Progressing Goal: Will remain free from infection Outcome: Progressing Goal: Diagnostic test results will improve Outcome: Progressing Goal: Respiratory complications will improve Outcome: Progressing Goal: Cardiovascular complication will be avoided Outcome: Progressing

## 2024-09-08 NOTE — Progress Notes (Signed)
 Patient states he wants dressing change at 1000 as it was changed about that time yesterday.

## 2024-09-08 NOTE — Plan of Care (Signed)
  Problem: Coping: Goal: Ability to adjust to condition or change in health will improve Outcome: Not Progressing   Problem: Metabolic: Goal: Ability to maintain appropriate glucose levels will improve Outcome: Not Progressing   Problem: Nutritional: Goal: Maintenance of adequate nutrition will improve Outcome: Not Progressing Goal: Progress toward achieving an optimal weight will improve Outcome: Not Progressing   Problem: Skin Integrity: Goal: Risk for impaired skin integrity will decrease Outcome: Not Progressing   Problem: Tissue Perfusion: Goal: Adequacy of tissue perfusion will improve Outcome: Not Progressing   Problem: Clinical Measurements: Goal: Will remain free from infection Outcome: Not Progressing   Problem: Pain Management: Goal: Pain level will decrease with appropriate interventions Outcome: Not Progressing

## 2024-09-08 NOTE — Progress Notes (Signed)
 OT Cancellation Note  Patient Details Name: Ryan [name] MRN: [id number] DOB: 1966/07/04   Cancelled Treatment:    Reason Eval/Treat Not Completed: Other (comment) OT attempted tx session this AM after Pt was given IV dilaudid  and robax per Pt request from date prior. Pt declined session stating he needed additional pain meds prior to therapy. OT to follow per POC as appropriate and schedule allows.   Maurilio CROME, OTR/LSABRA  [facility]  Office: [phone]   Maurilio PARAS Stephie Xu 09/08/2024, 4:26 PM

## 2024-09-08 NOTE — TOC Progression Note (Signed)
 Transition of Care [facility]) - Progression Note    Patient Details  Name: Ryan [name] MRN: [id number] Date of Birth: 03/12/1966  [facility] CM/SW Contact  Luann SHAUNNA [place], [place] Phone Number: 09/08/2024, 1:15 PM  Clinical Narrative:     Pt still undecided between SNF and HH. He states it depends on whether he is able to figure out a space for hospital bed to be set up in the home that he would be going to. He is coordinating with the individuals he would be staying with. He states that is the deciding factor between his decision.   CSW spoke with [facility] admissions. They do not currently have a male bed. They have a resident who is appealing their insurance denial so bed availability is depending on the result of that appeal. Pt has no other bed offers.    Expected Discharge Plan: Home w Home Health Services Barriers to Discharge: Continued Medical Work up               Expected Discharge Plan and Services In-house Referral: Clinical Social Work Discharge Planning Services: CM Consult Post Acute Care Choice:  (TBD) Living arrangements for the past 2 months: Single Family Home                           HH Arranged: IV Antibiotics, RN HH Agency: [facility] Date HH Agency Contacted: 08/25/24   Representative spoke with at [facility] Agency: Pam   Social Drivers of Health (SDOH) Interventions SDOH Screenings   Food Insecurity: Food Insecurity Present (08/21/2024)  Housing: Low Risk  (08/21/2024)  Transportation Needs: No Transportation Needs (08/21/2024)  Utilities: Not At Risk (08/21/2024)  Financial Resource Strain: High Risk (01/24/2021)   Received from [facility] visits prior to 01/10/2023.  Social Connections: Unknown (08/21/2024)  Tobacco Use: High Risk (08/31/2024)    Readmission Risk Interventions     No data to display

## 2024-09-09 DIAGNOSIS — L03116 Cellulitis of left lower limb: Secondary | ICD-10-CM | POA: Diagnosis not present

## 2024-09-09 LAB — BASIC METABOLIC PANEL WITH GFR
Anion gap: 10 (ref 5–15)
BUN: 26 mg/dL — ABNORMAL HIGH (ref 6–20)
CO2: 25 mmol/L (ref 22–32)
Calcium: 8.5 mg/dL — ABNORMAL LOW (ref 8.9–10.3)
Chloride: 99 mmol/L (ref 98–111)
Creatinine, Ser: 1.06 mg/dL (ref 0.61–1.24)
GFR, Estimated: >60 mL/min (ref >60)
Glucose, Bld: 120 mg/dL — ABNORMAL HIGH (ref 70–99)
Potassium: 4.4 mmol/L (ref 3.5–5.1)
Sodium: 134 mmol/L — ABNORMAL LOW (ref 135–145)

## 2024-09-09 LAB — CBC WITH DIFFERENTIAL/PLATELET
Abs Immature Granulocytes: 0.03 K/uL (ref 0.00–0.07)
Basophils Absolute: 0.1 K/uL (ref 0.0–0.1)
Basophils Relative: 1 %
Eosinophils Absolute: 0.3 K/uL (ref 0.0–0.5)
Eosinophils Relative: 5 %
HCT: 29.2 % — ABNORMAL LOW (ref 39.0–52.0)
Hemoglobin: 8.6 g/dL — ABNORMAL LOW (ref 13.0–17.0)
Immature Granulocytes: 0 %
Lymphocytes Relative: 25 %
Lymphs Abs: 1.8 K/uL (ref 0.7–4.0)
MCH: 24.7 pg — ABNORMAL LOW (ref 26.0–34.0)
MCHC: 29.5 g/dL — ABNORMAL LOW (ref 30.0–36.0)
MCV: 83.9 fL (ref 80.0–100.0)
Monocytes Absolute: 0.9 K/uL (ref 0.1–1.0)
Monocytes Relative: 13 %
Neutro Abs: 4 K/uL (ref 1.7–7.7)
Neutrophils Relative %: 56 %
Platelets: 467 K/uL — ABNORMAL HIGH (ref 150–400)
RBC: 3.48 MIL/uL — ABNORMAL LOW (ref 4.22–5.81)
RDW: 18.8 % — ABNORMAL HIGH (ref 11.5–15.5)
WBC: 7.1 K/uL (ref 4.0–10.5)
nRBC: 0 % (ref 0.0–0.2)

## 2024-09-09 LAB — GLUCOSE, CAPILLARY
Glucose-Capillary: 100 mg/dL — ABNORMAL HIGH (ref 70–99)
Glucose-Capillary: 112 mg/dL — ABNORMAL HIGH (ref 70–99)
Glucose-Capillary: 67 mg/dL — ABNORMAL LOW (ref 70–99)
Glucose-Capillary: 98 mg/dL (ref 70–99)
Glucose-Capillary: 152 mg/dL — ABNORMAL HIGH (ref 70–99)

## 2024-09-09 MED ORDER — METHOCARBAMOL 500 MG PO TABS
500.0000 mg | ORAL_TABLET | Freq: Four times a day (QID) | ORAL | Status: DC | PRN
  Administered 2024-09-09 – 2024-09-16 (×21): 500 mg via ORAL
  Filled 2024-09-09 (×20): qty 1

## 2024-09-09 MED ORDER — INSULIN ASPART 100 UNIT/ML IJ SOLN
12.0000 [IU] | Freq: Three times a day (TID) | INTRAMUSCULAR | Status: DC
  Administered 2024-09-09 – 2024-09-10 (×2): 12 [IU] via SUBCUTANEOUS
  Filled 2024-09-09: qty 12

## 2024-09-09 NOTE — Progress Notes (Signed)
 PROGRESS NOTE  Ryan [name]  DOB: Mar 09, 1966  PCP: [name] [name], NP FMW:[id number]  DOA: 08/16/2024  LOS: 23 days  Hospital Day: 25  Subjective: Patient was seen and examined this morning.  Sitting up at the edge of the bed.  Not in distress.  We discussed her ultrasound findings from yesterday. Afebrile, hemodynamically stable. Labs this morning with WC count 7.1, hemoglobin 8.6, sodium 134.  Brief narrative: Ryan [name] is a 58 y.o. male with PMH significant for OSA, DM2, HTN, HLD, chronic venous insufficiencies, CHF, PAD, chronic smoking, COPD, GERD, diabetic neuropathy, s/p right BKA, chronic opiate use Patient follows up at [facility] for chronic left foot wound. 10/7, patient was sent to the ED from wound care center for evaluation of worsening left foot redness, pain and swelling with purulent drainage for last 2 months.  Left foot x-ray showed left fifth metatarsal joint changes consistent with osteomyelitis.   Admitted to [facility]  Orthopedics consulted  Empiric IV antibiotics was started  Blood culture sent on admission grew MRSA  Patient initially refused left BKA but he later agreed, underwent surgery on 10/22.  Assessment and plan: Left foot osteomyelitis Diabetic foot infection Patient initially refused left BKA but he later agreed, underwent left BKA on 10/22 by Dr. Harden On 10/26 he was found to have increased redness in his stump, ID and orthopedics reengaged, felt to be positional as he is keeping his stump downward for most part of the day.  He is being monitored off antibiotics.  10/30, ultrasound duplex of the stump showed ruled out DVT but showed enlarged lymph nodes in the groin. Wondering about any concern of infection.  Message sent to Dr. Harden who will see the patient tomorrow morning.. So far, no fever.  WBC count remains normal. Recent Labs  Lab 09/05/24 0558 09/06/24 0346 09/07/24 0417 09/08/24 0422 09/09/24 0327  WBC 8.6 8.5 9.3 8.1  7.1   MRSA bacteremia  Blood culture sent on admission grew MRSA  Last blood culture from 10/10 did not show any growth. TEE 10/15 negative for endocarditis Per ID recommendation, completed daptomycin course on 10/23.  Currently not on antibiotics  Impaired mobility Bilateral BKA status  Prior right BKA 2024 and now new left BKA Seen by PT.  SNF recommended  Chronic opiate use disorder Diabetic neuropathy PTA meds- Celebrex 200 mg daily, gabapentin  800 mg 3 times daily, oxycodone  15 mg every 4 hours PRN Current pain regimen --- Scheduled: celecoxib 200 mg daily, gabapentin  8 mg 3 times daily --- PRN: Oxycodone  15 mg every 6 hours, as needed IV Dilaudid , Tylenol , Robaxin every 6 hours   Type 2 diabetes mellitus uncontrolled A1c 13.2 on 08/16/2024 PTA meds-Semglee , Premeal insulin , Jardiance, metformin  Currently on Semglee  45 units twice daily, scheduled Premeal aspart 15 units 3 times daily, SSI/Accu-Cheks. Premeal aspart was held yesterday morning because of low blood sugar episode.  I will reduce it to 12 units 3 times daily. Consult diabetes coordinator Recent Labs  Lab 09/08/24 1116 09/08/24 1706 09/08/24 2106 09/09/24 0645 09/09/24 1113  GLUCAP 133* 89 197* 100* 112*   Chronic diastolic CHF HTN Echo 10/10 LVEF 60 to 65%, appears euvolemic.   PTA meds-Toprol  12.5 mg daily, hydralazine  25 mg 3 times daily, Currently on Bumex  1 mg daily, irbesartan 300 mg daily.   Blood pressure and heart rate remains controlled  PAD HLD Continue fenofibrate.  Not on antiplatelet or anticoagulant  Mild hyponatremia In the setting of diuretic use.  Continue to monitor Recent Labs  Lab 09/03/24 0508 09/05/24 0558 09/06/24 0346 09/07/24 0417 09/08/24 0422 09/09/24 0327  NA 132* 134* 135 134* 133* 134*    COPD  chronic daily smoker Respiratory status stable.   Counseled to quit smoking.  But patient prefers to go to SNF that allows him to smoke! Nicotine patch  offered  GERD Protonix   OSA Ordered nocturnal CPAP.  He said he uses it at home  Mobility:  PT Orders: Active   PT Follow up Rec: Skilled Nursing-Short Term Rehab (<3 Hours/Day)09/07/2024 1000   Goals of care   Code Status: Full Code     DVT prophylaxis:  enoxaparin (LOVENOX) injection 40 mg Start: 09/06/24 1200 SCD's Start: 08/31/24 1638 SCDs Start: 08/17/24 0224   Antimicrobials: Not on antibiotics currently Fluid: None Consultants: Orthopedics, ID Family Communication: None at bedside  Status: Inpatient Level of care:  Med-Surg   Patient is from: Home Needs to continue in-hospital care: Pending evaluation of wound by Dr. Harden tomorrow Anticipated d/c to: Pending clinical course, SNF recommended       Diet:  Diet Order             Diet Carb Modified Fluid consistency: Thin; Room service appropriate? Yes  Diet effective now                   Scheduled Meds:  bumetanide   1 mg Oral Daily   celecoxib  200 mg Oral Daily   docusate sodium   100 mg Oral Daily   enoxaparin (LOVENOX) injection  40 mg Subcutaneous Q24H   fenofibrate  54 mg Oral Daily   gabapentin   800 mg Oral TID   insulin  aspart  0-20 Units Subcutaneous TID WC   insulin  aspart  0-5 Units Subcutaneous QHS   insulin  aspart  12 Units Subcutaneous TID WC   insulin  glargine-yfgn  45 Units Subcutaneous BID   irbesartan  300 mg Oral Daily   pantoprazole   40 mg Oral QHS    PRN meds: acetaminophen  **OR** acetaminophen , hydrALAZINE , HYDROmorphone  (DILAUDID ) injection, labetalol , melatonin, methocarbamol, metoprolol  tartrate, ondansetron  (ZOFRAN ) IV, oxyCODONE , phenol   Infusions:    Antimicrobials: Anti-infectives (From admission, onward)    Start     Dose/Rate Route Frequency Ordered Stop   09/04/24 0900  DAPTOmycin (CUBICIN) IVPB 700 mg/100mL premix  Status:  Discontinued        700 mg 200 mL/hr over 30 Minutes Intravenous Daily 09/04/24 0808 09/04/24 1624   09/03/24 1000  doxycycline   (VIBRA -TABS) tablet 100 mg  Status:  Discontinued        100 mg Oral Every 12 hours 08/25/24 0935 09/04/24 0719   09/03/24 0000  doxycycline  (VIBRA -TABS) 100 MG tablet        100 mg Oral 2 times daily 08/25/24 0938 10/01/24 2359   09/02/24 0800  DAPTOmycin (CUBICIN) IVPB 700 mg/157mL premix        8 mg/kg  91.5 kg (Adjusted) 200 mL/hr over 30 Minutes Intravenous Daily 09/01/24 1107 09/02/24 0920   08/31/24 1300  ceFAZolin  (ANCEF ) IVPB 2g/100 mL premix        2 g 200 mL/hr over 30 Minutes Intravenous On call to O.R. 08/31/24 1258 08/31/24 1543   08/31/24 1300  vancomycin  (VANCOREADY) IVPB 1500 mg/300 mL        1,500 mg 150 mL/hr over 120 Minutes Intravenous On call to O.R. 08/31/24 1258 08/31/24 1543   08/20/24 1500  levofloxacin (LEVAQUIN) tablet 750 mg  Status:  Discontinued  750 mg Oral Daily 08/20/24 1410 08/23/24 0916   08/20/24 1500  metroNIDAZOLE (FLAGYL) tablet 500 mg  Status:  Discontinued        500 mg Oral Every 12 hours 08/20/24 1410 08/23/24 0916   08/18/24 1600  DAPTOmycin (CUBICIN) IVPB 700 mg/145mL premix  Status:  Discontinued        6 mg/kg  108.9 kg 200 mL/hr over 30 Minutes Intravenous Daily 08/18/24 1519 08/18/24 1529   08/18/24 1600  DAPTOmycin (CUBICIN) IVPB 700 mg/161mL premix  Status:  Discontinued        8 mg/kg  91.5 kg (Adjusted) 200 mL/hr over 30 Minutes Intravenous Daily 08/18/24 1529 09/01/24 1106   08/18/24 0930  vancomycin  (VANCOCIN ) IVPB 1000 mg/200 mL premix  Status:  Discontinued       Placed in Followed by Linked Group   1,000 mg 200 mL/hr over 60 Minutes Intravenous Every 12 hours 08/18/24 0916 08/18/24 1519   08/18/24 0715  vancomycin  (VANCOREADY) IVPB 1250 mg/250 mL  Status:  Discontinued       Placed in Followed by Linked Group   1,250 mg 166.7 mL/hr over 90 Minutes Intravenous Every 12 hours 08/17/24 1914 08/18/24 0916   08/17/24 1915  vancomycin  (VANCOREADY) IVPB 2000 mg/400 mL       Placed in Followed by Linked Group   2,000  mg 200 mL/hr over 120 Minutes Intravenous  Once 08/17/24 1914 08/17/24 2303   08/17/24 1000  linezolid (ZYVOX) IVPB 600 mg  Status:  Discontinued        600 mg 300 mL/hr over 60 Minutes Intravenous Every 12 hours 08/17/24 0228 08/17/24 1914   08/17/24 1000  piperacillin-tazobactam (ZOSYN) IVPB 3.375 g  Status:  Discontinued        3.375 g 12.5 mL/hr over 240 Minutes Intravenous Every 8 hours 08/17/24 0303 08/20/24 1409   08/17/24 0200  piperacillin-tazobactam (ZOSYN) IVPB 3.375 g        3.375 g 100 mL/hr over 30 Minutes Intravenous  Once 08/17/24 0158 08/17/24 0216   08/17/24 0200  linezolid (ZYVOX) IVPB 600 mg        600 mg 300 mL/hr over 60 Minutes Intravenous  Once 08/17/24 0158 08/17/24 0415       Objective: Vitals:   09/09/24 0438 09/09/24 0759  BP: 121/67 121/70  Pulse: 90 96  Resp: 18 17  Temp: 98 F (36.7 C) 98.2 F (36.8 C)  SpO2: 93% 97%    Intake/Output Summary (Last 24 hours) at 09/09/2024 1317 Last data filed at 09/09/2024 0847 Gross per 24 hour  Intake 420 ml  Output --  Net 420 ml   Filed Weights   08/26/24 0500 09/07/24 1500  Weight: 100.6 kg 107.9 kg   Weight change:  Body mass index is 31.38 kg/m.   Physical Exam: General exam: Pleasant, middle-aged Caucasian male Skin: No rashes, lesions or ulcers. HEENT: Atraumatic, normocephalic, no obvious bleeding Lungs: Clear to auscultation bilaterally,  CVS: S1, S2, no murmur,   GI/Abd: Soft, nontender, nondistended, bowel sound present,   CNS: Alert, awake, oriented x 3 Psychiatry: Mood appropriate Extremities: Bilateral amputation status.  Left amputation stump area swollen, red.  Dressing in place.  Data Review: I have personally reviewed the laboratory data and studies available.  F/u labs ordered Unresulted Labs (From admission, onward)     Start     Ordered   09/05/24 0500  CK  Weekly,   R     Question:  Specimen collection method  Answer:  Lab=Lab collect   09/04/24 9165             Signed, Chapman Rota, MD [facility] 09/09/2024

## 2024-09-09 NOTE — Plan of Care (Signed)
  Problem: Education: Goal: Ability to describe self-care measures that may prevent or decrease complications (Diabetes Survival Skills Education) will improve Outcome: Progressing Goal: Individualized Educational Video(s) Outcome: Progressing   Problem: Coping: Goal: Ability to adjust to condition or change in health will improve Outcome: Progressing   Problem: Fluid Volume: Goal: Ability to maintain a balanced intake and output will improve Outcome: Progressing   Problem: Health Behavior/Discharge Planning: Goal: Ability to identify and utilize available resources and services will improve Outcome: Progressing Goal: Ability to manage health-related needs will improve Outcome: Progressing   Problem: Metabolic: Goal: Ability to maintain appropriate glucose levels will improve Outcome: Progressing   Problem: Nutritional: Goal: Maintenance of adequate nutrition will improve Outcome: Progressing Goal: Progress toward achieving an optimal weight will improve Outcome: Progressing   Problem: Skin Integrity: Goal: Risk for impaired skin integrity will decrease Outcome: Progressing   Problem: Tissue Perfusion: Goal: Adequacy of tissue perfusion will improve Outcome: Progressing   Problem: Education: Goal: Knowledge of General Education information will improve Description: Including pain rating scale, medication(s)/side effects and non-pharmacologic comfort measures Outcome: Progressing   Problem: Health Behavior/Discharge Planning: Goal: Ability to manage health-related needs will improve Outcome: Progressing   Problem: Clinical Measurements: Goal: Ability to maintain clinical measurements within normal limits will improve Outcome: Progressing Goal: Will remain free from infection Outcome: Progressing Goal: Diagnostic test results will improve Outcome: Progressing Goal: Respiratory complications will improve Outcome: Progressing Goal: Cardiovascular complication will  be avoided Outcome: Progressing   Problem: Activity: Goal: Risk for activity intolerance will decrease Outcome: Progressing   Problem: Nutrition: Goal: Adequate nutrition will be maintained Outcome: Progressing   Problem: Coping: Goal: Level of anxiety will decrease Outcome: Progressing   Problem: Elimination: Goal: Will not experience complications related to bowel motility Outcome: Progressing Goal: Will not experience complications related to urinary retention Outcome: Progressing   Problem: Pain Managment: Goal: General experience of comfort will improve and/or be controlled Outcome: Progressing   Problem: Safety: Goal: Ability to remain free from injury will improve Outcome: Progressing   Problem: Skin Integrity: Goal: Risk for impaired skin integrity will decrease Outcome: Progressing   Problem: Education: Goal: Knowledge of the prescribed therapeutic regimen will improve Outcome: Progressing Goal: Ability to verbalize activity precautions or restrictions will improve Outcome: Progressing Goal: Understanding of discharge needs will improve Outcome: Progressing   Problem: Activity: Goal: Ability to perform//tolerate increased activity and mobilize with assistive devices will improve Outcome: Progressing   Problem: Clinical Measurements: Goal: Postoperative complications will be avoided or minimized Outcome: Progressing   Problem: Self-Care: Goal: Ability to meet self-care needs will improve Outcome: Progressing   Problem: Self-Concept: Goal: Ability to maintain and perform role responsibilities to the fullest extent possible will improve Outcome: Progressing   Problem: Pain Management: Goal: Pain level will decrease with appropriate interventions Outcome: Progressing   Problem: Skin Integrity: Goal: Demonstration of wound healing without infection will improve Outcome: Progressing

## 2024-09-09 NOTE — Progress Notes (Signed)
 Hypoglycemic Event  CBG: 67  Treatment: 4 oz juice/soda  Symptoms: None  Follow-up CBG: Time:15 min CBG Result:98  Possible Reasons for Event: Unknown  Comments/MD notified: Dr. Arlice notified of glucose results.  Approved of scheduled insulin  being held at dinner.    Adrien [name] Mayer

## 2024-09-10 DIAGNOSIS — L03116 Cellulitis of left lower limb: Secondary | ICD-10-CM | POA: Diagnosis not present

## 2024-09-10 LAB — GLUCOSE, CAPILLARY
Glucose-Capillary: 105 mg/dL — ABNORMAL HIGH (ref 70–99)
Glucose-Capillary: 99 mg/dL (ref 70–99)
Glucose-Capillary: 95 mg/dL (ref 70–99)
Glucose-Capillary: 204 mg/dL — ABNORMAL HIGH (ref 70–99)

## 2024-09-10 MED ORDER — INSULIN ASPART 100 UNIT/ML IJ SOLN
5.0000 [IU] | Freq: Three times a day (TID) | INTRAMUSCULAR | Status: DC
  Administered 2024-09-11 – 2024-09-15 (×13): 5 [IU] via SUBCUTANEOUS
  Filled 2024-09-10 (×11): qty 5

## 2024-09-10 NOTE — Progress Notes (Signed)
 PROGRESS NOTE  Ryan [name]  DOB: [birth year]  PCP: [name] [name], NP FMW:[id number]  DOA: 08/16/2024  LOS: 24 days  Hospital Day: 26  Subjective: Patient was seen and examined this morning. Sitting up at the edge of the bed.  Not in distress. Seen by Dr. Harden this morning.  Incision site intact without any drainage but with the concern of surrounding cellulitis, suggested IV antibiotics Afebrile, hemodynamically stable Glucose level running on the lower side.  Was low at 67 yesterday afternoon.  Brief narrative: Ryan [name] is a 58 y.o. male with PMH significant for OSA, DM2, HTN, HLD, chronic venous insufficiencies, CHF, PAD, chronic smoking, COPD, GERD, diabetic neuropathy, s/p right BKA, chronic opiate use Patient follows up at [facility] for chronic left foot wound. 10/7, patient was sent to the ED from wound care center for evaluation of worsening left foot redness, pain and swelling with purulent drainage for last 2 months.  Left foot x-ray showed left fifth metatarsal joint changes consistent with osteomyelitis.   Admitted to [facility]  Orthopedics consulted  Empiric IV antibiotics was started  Blood culture sent on admission grew MRSA  Patient initially refused left BKA but he later agreed, underwent surgery on 10/22.  Assessment and plan: Left foot osteomyelitis Diabetic foot infection Left BKA stump cellulitis Patient initially refused left BKA but he later agreed, underwent left BKA on 10/22 by Dr. Harden On 10/26 he was found to have increased redness in his stump, ID and orthopedics reengaged, felt to be positional as he is keeping his stump downward for most part of the day.  He was monitored off antibiotics.   10/30, ultrasound duplex of the stump showed ruled out DVT but showed enlarged lymph nodes in the groin.  11/1, seen by Dr. Harden.  Incision site intact without any drainage but given concern of surrounding cellulitis, suggested IV antibiotics.   Earlier this hospitalization, patient completed IV daptomycin.  I have sent a message to ID Dr. Dennise for reconsultation. so far, no fever.  WBC count remains normal. Recent Labs  Lab 09/05/24 0558 09/06/24 0346 09/07/24 0417 09/08/24 0422 09/09/24 0327  WBC 8.6 8.5 9.3 8.1 7.1   MRSA bacteremia  Blood culture sent on admission grew MRSA  Last blood culture from 10/10 did not show any growth. TEE 10/15 negative for endocarditis Per ID recommendation, completed daptomycin course on 10/23.  Defer to ID for next course of antibiotics for cellulitis.  Impaired mobility Bilateral BKA status  Prior right BKA 2024 and now new left BKA Seen by PT.  SNF recommended  Chronic opiate use disorder Diabetic neuropathy PTA meds- Celebrex 200 mg daily, gabapentin  800 mg 3 times daily, oxycodone  15 mg every 4 hours PRN Current pain regimen --- Scheduled: celecoxib 200 mg daily, gabapentin  8 mg 3 times daily --- PRN: Oxycodone  15 mg every 6 hours, as needed IV Dilaudid , Tylenol , Robaxin every 6 hours   Type 2 diabetes mellitus uncontrolled A1c 13.2 on 08/16/2024 PTA meds-Semglee , Premeal insulin , Jardiance, metformin  Currently on Semglee  45 units twice daily, scheduled Premeal aspart 12 units 3 times daily, SSI/Accu-Cheks. In the last 24 hours, blood sugar level has been running less than 100 and Premeal insulin  will skip.  I will reduce it to 5 units 3 times daily today. Diabetes care coordinator consult appreciated. Recent Labs  Lab 09/09/24 1113 09/09/24 1628 09/09/24 1652 09/09/24 2231 09/10/24 0617  GLUCAP 112* 67* 98 152* 105*   Chronic diastolic CHF HTN Echo  10/10 LVEF 60 to 65%, appears euvolemic.   PTA meds-Toprol  12.5 mg daily, hydralazine  25 mg 3 times daily, Currently on Bumex  1 mg daily, irbesartan 300 mg daily.   Blood pressure and heart rate remains controlled  PAD HLD Continue fenofibrate.  Not on antiplatelet or anticoagulant  Mild hyponatremia In the setting of  diuretic use.  Continue to monitor Recent Labs  Lab 09/05/24 0558 09/06/24 0346 09/07/24 0417 09/08/24 0422 09/09/24 0327  NA 134* 135 134* 133* 134*    COPD  chronic daily smoker Respiratory status stable.   Counseled to quit smoking.  But patient prefers to go to SNF that allows him to smoke! Nicotine patch offered  GERD Protonix   OSA Ordered nocturnal CPAP.  He said he uses it at home  Mobility:  PT Orders: Active   PT Follow up Rec: Skilled Nursing-Short Term Rehab (<3 Hours/Day)09/07/2024 1000   Goals of care   Code Status: Full Code     DVT prophylaxis:  enoxaparin (LOVENOX) injection 40 mg Start: 09/06/24 1200 SCD's Start: 08/31/24 1638 SCDs Start: 08/17/24 0224   Antimicrobials: Not on antibiotics currently Fluid: None Consultants: Orthopedics, ID Family Communication: None at bedside  Status: Inpatient Level of care:  Med-Surg   Patient is from: Home Needs to continue in-hospital care: ID has been reinvolved again. Anticipated d/c to: Pending clinical course, SNF recommended       Diet:  Diet Order             Diet Carb Modified Fluid consistency: Thin; Room service appropriate? Yes  Diet effective now                   Scheduled Meds:  bumetanide   1 mg Oral Daily   celecoxib  200 mg Oral Daily   docusate sodium   100 mg Oral Daily   enoxaparin (LOVENOX) injection  40 mg Subcutaneous Q24H   fenofibrate  54 mg Oral Daily   gabapentin   800 mg Oral TID   insulin  aspart  0-20 Units Subcutaneous TID WC   insulin  aspart  0-5 Units Subcutaneous QHS   insulin  aspart  5 Units Subcutaneous TID WC   insulin  glargine-yfgn  45 Units Subcutaneous BID   irbesartan  300 mg Oral Daily   pantoprazole   40 mg Oral QHS    PRN meds: acetaminophen  **OR** acetaminophen , hydrALAZINE , HYDROmorphone  (DILAUDID ) injection, labetalol , melatonin, methocarbamol, metoprolol  tartrate, ondansetron  (ZOFRAN ) IV, oxyCODONE , phenol   Infusions:     Antimicrobials: Anti-infectives (From admission, onward)    Start     Dose/Rate Route Frequency Ordered Stop   09/04/24 0900  DAPTOmycin (CUBICIN) IVPB 700 mg/100mL premix  Status:  Discontinued        700 mg 200 mL/hr over 30 Minutes Intravenous Daily 09/04/24 0808 09/04/24 1624   09/03/24 1000  doxycycline  (VIBRA -TABS) tablet 100 mg  Status:  Discontinued        100 mg Oral Every 12 hours 08/25/24 0935 09/04/24 0719   09/03/24 0000  doxycycline  (VIBRA -TABS) 100 MG tablet        100 mg Oral 2 times daily 08/25/24 0938 10/01/24 2359   09/02/24 0800  DAPTOmycin (CUBICIN) IVPB 700 mg/169mL premix        8 mg/kg  91.5 kg (Adjusted) 200 mL/hr over 30 Minutes Intravenous Daily 09/01/24 1107 09/02/24 0920   08/31/24 1300  ceFAZolin  (ANCEF ) IVPB 2g/100 mL premix        2 g 200 mL/hr over 30 Minutes Intravenous On call to  O.R. 08/31/24 1258 08/31/24 1543   08/31/24 1300  vancomycin  (VANCOREADY) IVPB 1500 mg/300 mL        1,500 mg 150 mL/hr over 120 Minutes Intravenous On call to O.R. 08/31/24 1258 08/31/24 1543   08/20/24 1500  levofloxacin (LEVAQUIN) tablet 750 mg  Status:  Discontinued        750 mg Oral Daily 08/20/24 1410 08/23/24 0916   08/20/24 1500  metroNIDAZOLE (FLAGYL) tablet 500 mg  Status:  Discontinued        500 mg Oral Every 12 hours 08/20/24 1410 08/23/24 0916   08/18/24 1600  DAPTOmycin (CUBICIN) IVPB 700 mg/163mL premix  Status:  Discontinued        6 mg/kg  108.9 kg 200 mL/hr over 30 Minutes Intravenous Daily 08/18/24 1519 08/18/24 1529   08/18/24 1600  DAPTOmycin (CUBICIN) IVPB 700 mg/174mL premix  Status:  Discontinued        8 mg/kg  91.5 kg (Adjusted) 200 mL/hr over 30 Minutes Intravenous Daily 08/18/24 1529 09/01/24 1106   08/18/24 0930  vancomycin  (VANCOCIN ) IVPB 1000 mg/200 mL premix  Status:  Discontinued       Placed in Followed by Linked Group   1,000 mg 200 mL/hr over 60 Minutes Intravenous Every 12 hours 08/18/24 0916 08/18/24 1519   08/18/24 0715   vancomycin  (VANCOREADY) IVPB 1250 mg/250 mL  Status:  Discontinued       Placed in Followed by Linked Group   1,250 mg 166.7 mL/hr over 90 Minutes Intravenous Every 12 hours 08/17/24 1914 08/18/24 0916   08/17/24 1915  vancomycin  (VANCOREADY) IVPB 2000 mg/400 mL       Placed in Followed by Linked Group   2,000 mg 200 mL/hr over 120 Minutes Intravenous  Once 08/17/24 1914 08/17/24 2303   08/17/24 1000  linezolid (ZYVOX) IVPB 600 mg  Status:  Discontinued        600 mg 300 mL/hr over 60 Minutes Intravenous Every 12 hours 08/17/24 0228 08/17/24 1914   08/17/24 1000  piperacillin-tazobactam (ZOSYN) IVPB 3.375 g  Status:  Discontinued        3.375 g 12.5 mL/hr over 240 Minutes Intravenous Every 8 hours 08/17/24 0303 08/20/24 1409   08/17/24 0200  piperacillin-tazobactam (ZOSYN) IVPB 3.375 g        3.375 g 100 mL/hr over 30 Minutes Intravenous  Once 08/17/24 0158 08/17/24 0216   08/17/24 0200  linezolid (ZYVOX) IVPB 600 mg        600 mg 300 mL/hr over 60 Minutes Intravenous  Once 08/17/24 0158 08/17/24 0415       Objective: Vitals:   09/10/24 0523 09/10/24 0759  BP: 139/74 (!) 145/87  Pulse: 88 95  Resp: 18 16  Temp: 97.6 F (36.4 C) (!) 96.6 F (35.9 C)  SpO2: 93% 100%    Intake/Output Summary (Last 24 hours) at 09/10/2024 1133 Last data filed at 09/09/2024 1800 Gross per 24 hour  Intake --  Output 575 ml  Net -575 ml   Filed Weights   08/26/24 0500 09/07/24 1500  Weight: 100.6 kg 107.9 kg   Weight change:  Body mass index is 31.38 kg/m.   Physical Exam: General exam: Pleasant, middle-aged Caucasian male Skin: No rashes, lesions or ulcers. HEENT: Atraumatic, normocephalic, no obvious bleeding Lungs: Clear to auscultation bilaterally,  CVS: S1, S2, no murmur,   GI/Abd: Soft, nontender, nondistended, bowel sound present,   CNS: Alert, awake, oriented x 3 Psychiatry: Mood appropriate Extremities: Bilateral amputation status.  Left amputation  stump area swollen,  red.  Dressing in place.  Data Review: I have personally reviewed the laboratory data and studies available.  F/u labs ordered Unresulted Labs (From admission, onward)     Start     Ordered   09/05/24 0500  CK  Weekly,   R     Question:  Specimen collection method  Answer:  Lab=Lab collect   09/04/24 0834            Signed, Chapman Rota, MD [facility] 09/10/2024

## 2024-09-10 NOTE — Progress Notes (Signed)
 Patient ID: Ryan [name], male   DOB: July 06, 1966, 58 y.o.   MRN: [id number] Patient is status post left transtibial amputation.  The surgical incision is well-approximated there is no drainage from the incision no  wound dehiscence there is 1 small area medially with an eschar approximately 2 cm in diameter.  Patient has had progressive cellulitis of the left residual limb and cellulitis and induration in the thigh as well.  Recommend empiric IV antibiotics.  Do not see an area of focal infection that could be treated surgically.

## 2024-09-10 DEATH — deceased

## 2024-09-11 DIAGNOSIS — Z89512 Acquired absence of left leg below knee: Secondary | ICD-10-CM

## 2024-09-11 DIAGNOSIS — L03116 Cellulitis of left lower limb: Secondary | ICD-10-CM | POA: Diagnosis not present

## 2024-09-11 DIAGNOSIS — Z8614 Personal history of Methicillin resistant Staphylococcus aureus infection: Secondary | ICD-10-CM | POA: Diagnosis not present

## 2024-09-11 LAB — GLUCOSE, CAPILLARY
Glucose-Capillary: 61 mg/dL — ABNORMAL LOW (ref 70–99)
Glucose-Capillary: 203 mg/dL — ABNORMAL HIGH (ref 70–99)
Glucose-Capillary: 171 mg/dL — ABNORMAL HIGH (ref 70–99)
Glucose-Capillary: 168 mg/dL — ABNORMAL HIGH (ref 70–99)

## 2024-09-11 MED ORDER — DAPTOMYCIN-SODIUM CHLORIDE 700-0.9 MG/100ML-% IV SOLN
700.0000 mg | Freq: Every day | INTRAVENOUS | Status: DC
  Administered 2024-09-11 – 2024-09-16 (×6): 700 mg via INTRAVENOUS
  Filled 2024-09-11 (×7): qty 100

## 2024-09-11 MED ORDER — INSULIN GLARGINE-YFGN 100 UNIT/ML ~~LOC~~ SOLN
35.0000 [IU] | Freq: Two times a day (BID) | SUBCUTANEOUS | Status: DC
  Administered 2024-09-11 – 2024-09-13 (×4): 35 [IU] via SUBCUTANEOUS
  Filled 2024-09-11 (×5): qty 0.35

## 2024-09-11 MED ORDER — SODIUM CHLORIDE 0.9 % IV SOLN: INTRAVENOUS | Status: AC | PRN

## 2024-09-11 NOTE — Progress Notes (Signed)
 PROGRESS NOTE  Ryan [name]  DOB: [birth year]  PCP: Ryan Lamar [name], NP FMW:[id number]  DOA: 08/16/2024  LOS: 25 days  Hospital Day: 27  Subjective: Patient was seen and examined this morning. Sitting up at the edge of the bed.  Not in distress. Afebrile, blood pressure in 140s and 150s, breathing on room air Blood sugar remains better.  It was 204 last night and down to 61 this morning.  Brief narrative: Ryan [name] is a 58 y.o. male with PMH significant for OSA, DM2, HTN, HLD, chronic venous insufficiencies, CHF, PAD, chronic smoking, COPD, GERD, diabetic neuropathy, s/p right BKA, chronic opiate use Patient follows up at [facility] for chronic left foot wound. 10/7, patient was sent to the ED from wound care center for evaluation of worsening left foot redness, pain and swelling with purulent drainage for last 2 months.  Left foot x-ray showed left fifth metatarsal joint changes consistent with osteomyelitis.   Admitted to [facility]  Orthopedics consulted  Empiric IV antibiotics was started  Blood culture sent on admission grew MRSA  Patient initially refused left BKA but he later agreed, underwent surgery on 10/22.  Assessment and plan: Left foot osteomyelitis Diabetic foot infection Left BKA stump cellulitis Patient initially refused left BKA but he later agreed, underwent left BKA on 10/22 by Ryan [name] On 10/26 he was found to have increased redness in his stump, ID and orthopedics reengaged, felt to be positional as he is keeping his stump downward for most part of the day.  He was monitored off antibiotics.   10/30, ultrasound duplex of the stump showed ruled out DVT but showed enlarged lymph nodes in the groin.  11/1, seen by Ryan [name].  Incision site intact without any drainage but given concern of surrounding cellulitis, suggested IV antibiotics.  ID consulted So far, no fever.  WBC count remains normal. Recent Labs  Lab 09/05/24 0558 09/06/24 0346  09/07/24 0417 09/08/24 0422 09/09/24 0327  WBC 8.6 8.5 9.3 8.1 7.1   MRSA bacteremia  Blood culture sent on admission grew MRSA  Last blood culture from 10/10 did not show any growth. TEE 10/15 negative for endocarditis Per ID recommendation, completed daptomycin course on 10/23.  Defer to ID for next course of antibiotics for cellulitis.  Impaired mobility Bilateral BKA status  Prior right BKA 2024 and now new left BKA Seen by PT.  SNF recommended  Chronic opiate use disorder Diabetic neuropathy PTA meds- Celebrex 200 mg daily, gabapentin  800 mg 3 times daily, oxycodone  15 mg every 4 hours PRN Current pain regimen --- Scheduled: celecoxib 200 mg daily, gabapentin  800 mg 3 times daily --- PRN: Oxycodone  15 mg every 4 hours, IV Dilaudid  every 4 hours, Tylenol , Robaxin 500 mg every 6 hours   Type 2 diabetes mellitus uncontrolled A1c 13.2 on 08/16/2024 PTA meds-Semglee , Premeal insulin , Jardiance, metformin  Currently on Semglee  45 units twice daily, scheduled Premeal aspart 5 units 3 times daily, SSI/Accu-Cheks. Blood sugar remains better.  It was 204 last night and down to 61 this morning. Semglee  dose was skipped this morning.  Reduce from 45 to 35 units twice daily starting tonight Diabetes care coordinator consult appreciated. Recent Labs  Lab 09/10/24 1214 09/10/24 1507 09/10/24 2029 09/11/24 0747 09/11/24 1141  GLUCAP 99 95 204* 61* 203*   Chronic diastolic CHF HTN Echo 10/10 LVEF 60 to 65%, appears euvolemic.   PTA meds-Toprol  12.5 mg daily, hydralazine  25 mg 3 times daily, Currently on Bumex  1 mg  daily, irbesartan 300 mg daily.   Blood pressure and heart rate remains controlled  PAD HLD Continue fenofibrate.  Not on antiplatelet or anticoagulant  Mild hyponatremia In the setting of diuretic use.  Continue to monitor Recent Labs  Lab 09/05/24 0558 09/06/24 0346 09/07/24 0417 09/08/24 0422 09/09/24 0327  NA 134* 135 134* 133* 134*    COPD  chronic  daily smoker Respiratory status stable.   Counseled to quit smoking.  But patient prefers to go to SNF that allows him to smoke! Nicotine patch offered  GERD Protonix   OSA Ordered nocturnal CPAP.  He said he uses it at home  Mobility:  PT Orders: Active   PT Follow up Rec: Skilled Nursing-Short Term Rehab (<3 Hours/Day)09/07/2024 1000   Goals of care   Code Status: Full Code     DVT prophylaxis:  enoxaparin (LOVENOX) injection 40 mg Start: 09/06/24 1200 SCD's Start: 08/31/24 1638 SCDs Start: 08/17/24 0224   Antimicrobials: Not on antibiotics currently Fluid: None Consultants: Orthopedics, ID Family Communication: None at bedside  Status: Inpatient Level of care:  Med-Surg   Patient is from: Home Needs to continue in-hospital care: ID has been reinvolved again. Anticipated d/c to: Pending clinical course, SNF recommended       Diet:  Diet Order             Diet Carb Modified Fluid consistency: Thin; Room service appropriate? Yes  Diet effective now                   Scheduled Meds:  bumetanide   1 mg Oral Daily   celecoxib  200 mg Oral Daily   docusate sodium   100 mg Oral Daily   enoxaparin (LOVENOX) injection  40 mg Subcutaneous Q24H   fenofibrate  54 mg Oral Daily   gabapentin   800 mg Oral TID   insulin  aspart  0-20 Units Subcutaneous TID WC   insulin  aspart  0-5 Units Subcutaneous QHS   insulin  aspart  5 Units Subcutaneous TID WC   insulin  glargine-yfgn  35 Units Subcutaneous BID   irbesartan  300 mg Oral Daily   pantoprazole   40 mg Oral QHS    PRN meds: acetaminophen  **OR** acetaminophen , hydrALAZINE , HYDROmorphone  (DILAUDID ) injection, labetalol , melatonin, methocarbamol, metoprolol  tartrate, ondansetron  (ZOFRAN ) IV, oxyCODONE , phenol   Infusions:    Antimicrobials: Anti-infectives (From admission, onward)    Start     Dose/Rate Route Frequency Ordered Stop   09/04/24 0900  DAPTOmycin (CUBICIN) IVPB 700 mg/100mL premix  Status:   Discontinued        700 mg 200 mL/hr over 30 Minutes Intravenous Daily 09/04/24 0808 09/04/24 1624   09/03/24 1000  doxycycline  (VIBRA -TABS) tablet 100 mg  Status:  Discontinued        100 mg Oral Every 12 hours 08/25/24 0935 09/04/24 0719   09/03/24 0000  doxycycline  (VIBRA -TABS) 100 MG tablet        100 mg Oral 2 times daily 08/25/24 0938 10/01/24 2359   09/02/24 0800  DAPTOmycin (CUBICIN) IVPB 700 mg/132mL premix        8 mg/kg  91.5 kg (Adjusted) 200 mL/hr over 30 Minutes Intravenous Daily 09/01/24 1107 09/02/24 0920   08/31/24 1300  ceFAZolin  (ANCEF ) IVPB 2g/100 mL premix        2 g 200 mL/hr over 30 Minutes Intravenous On call to O.R. 08/31/24 1258 08/31/24 1543   08/31/24 1300  vancomycin  (VANCOREADY) IVPB 1500 mg/300 mL        1,500 mg  150 mL/hr over 120 Minutes Intravenous On call to O.R. 08/31/24 1258 08/31/24 1543   08/20/24 1500  levofloxacin (LEVAQUIN) tablet 750 mg  Status:  Discontinued        750 mg Oral Daily 08/20/24 1410 08/23/24 0916   08/20/24 1500  metroNIDAZOLE (FLAGYL) tablet 500 mg  Status:  Discontinued        500 mg Oral Every 12 hours 08/20/24 1410 08/23/24 0916   08/18/24 1600  DAPTOmycin (CUBICIN) IVPB 700 mg/175mL premix  Status:  Discontinued        6 mg/kg  108.9 kg 200 mL/hr over 30 Minutes Intravenous Daily 08/18/24 1519 08/18/24 1529   08/18/24 1600  DAPTOmycin (CUBICIN) IVPB 700 mg/118mL premix  Status:  Discontinued        8 mg/kg  91.5 kg (Adjusted) 200 mL/hr over 30 Minutes Intravenous Daily 08/18/24 1529 09/01/24 1106   08/18/24 0930  vancomycin  (VANCOCIN ) IVPB 1000 mg/200 mL premix  Status:  Discontinued       Placed in Followed by Linked Group   1,000 mg 200 mL/hr over 60 Minutes Intravenous Every 12 hours 08/18/24 0916 08/18/24 1519   08/18/24 0715  vancomycin  (VANCOREADY) IVPB 1250 mg/250 mL  Status:  Discontinued       Placed in Followed by Linked Group   1,250 mg 166.7 mL/hr over 90 Minutes Intravenous Every 12 hours 08/17/24 1914  08/18/24 0916   08/17/24 1915  vancomycin  (VANCOREADY) IVPB 2000 mg/400 mL       Placed in Followed by Linked Group   2,000 mg 200 mL/hr over 120 Minutes Intravenous  Once 08/17/24 1914 08/17/24 2303   08/17/24 1000  linezolid (ZYVOX) IVPB 600 mg  Status:  Discontinued        600 mg 300 mL/hr over 60 Minutes Intravenous Every 12 hours 08/17/24 0228 08/17/24 1914   08/17/24 1000  piperacillin-tazobactam (ZOSYN) IVPB 3.375 g  Status:  Discontinued        3.375 g 12.5 mL/hr over 240 Minutes Intravenous Every 8 hours 08/17/24 0303 08/20/24 1409   08/17/24 0200  piperacillin-tazobactam (ZOSYN) IVPB 3.375 g        3.375 g 100 mL/hr over 30 Minutes Intravenous  Once 08/17/24 0158 08/17/24 0216   08/17/24 0200  linezolid (ZYVOX) IVPB 600 mg        600 mg 300 mL/hr over 60 Minutes Intravenous  Once 08/17/24 0158 08/17/24 0415       Objective: Vitals:   09/11/24 0413 09/11/24 0801  BP: (!) 151/73 (!) 146/81  Pulse: 93 92  Resp: 17 18  Temp: 98.5 F (36.9 C) (!) 97.5 F (36.4 C)  SpO2: 96% 95%   No intake or output data in the 24 hours ending 09/11/24 1433  Filed Weights   08/26/24 0500 09/07/24 1500  Weight: 100.6 kg 107.9 kg   Weight change:  Body mass index is 31.38 kg/m.   Physical Exam: General exam: Pleasant, middle-aged Caucasian male Skin: No rashes, lesions or ulcers. HEENT: Atraumatic, normocephalic, no obvious bleeding Lungs: Clear to auscultation bilaterally,  CVS: S1, S2, no murmur,   GI/Abd: Soft, nontender, nondistended, bowel sound present,   CNS: Alert, awake, oriented x 3 Psychiatry: Mood appropriate Extremities: Bilateral amputation status.  Left amputation stump area swollen, red.  Dressing in place.  Data Review: I have personally reviewed the laboratory data and studies available.  F/u labs ordered Unresulted Labs (From admission, onward)     Start     Ordered   09/12/24  0500  Basic metabolic panel with GFR  Tomorrow morning,   R        Question:  Specimen collection method  Answer:  Lab=Lab collect   09/11/24 1001   09/12/24 0500  CBC with Differential/Platelet  Tomorrow morning,   R       Question:  Specimen collection method  Answer:  Lab=Lab collect   09/11/24 1001   09/11/24 1317  Culture, blood (Routine X 2) w Reflex to ID Panel  BLOOD CULTURE X 2,   R      09/11/24 1316   09/05/24 0500  CK  Weekly,   R     Question:  Specimen collection method  Answer:  Lab=Lab collect   09/04/24 0834            Signed, Chapman Rota, MD [facility] 09/11/2024

## 2024-09-11 NOTE — Progress Notes (Signed)
 Pharmacy Antibiotic Note  Ryan [name] is a 58 y.o. male admitted on 08/16/2024 with cellulitis, s/p MRSA bacteremia treatment (completed daptomycin 10/21), s/p left BKA on 10/22. Pharmacy has been consulted for daptomycin dosing.  10/27 CK = 281  Plan: - daptomycin IV 700 mg daily  - Monitor baseline and weekly CK, clinical progression, and length of therapy  Height: 6' 1 (185.4 cm) Weight:  (unable to check, patient on bedside dabgling, refuse to lay back) IBW/kg (Calculated) : 79.9  Temp (24hrs), Avg:98 F (36.7 C), Min:97.5 F (36.4 C), Max:98.5 F (36.9 C)  Recent Labs  Lab 09/05/24 0558 09/06/24 0346 09/07/24 0417 09/08/24 0422 09/09/24 0327  WBC 8.6 8.5 9.3 8.1 7.1  CREATININE 0.98 1.15 1.06 1.04 1.06    Estimated Creatinine Clearance: 97.9 mL/min (by C-G formula based on SCr of 1.06 mg/dL).    Allergies  Allergen Reactions   Acetaminophen  Nausea And Vomiting   Thank you for allowing pharmacy to be a part of this patient's care.  Larraine Brazier, PharmD Clinical Pharmacist 09/11/2024  5:42 PM **Pharmacist phone directory can now be found on [email or web address] ([email or web address]).  Listed under [facility].

## 2024-09-11 NOTE — TOC Progression Note (Addendum)
 Transition of Care [facility]) - Progression Note    Patient Details  Name: Ryan [name] MRN: [id number] Date of Birth: 01-03-1966  [facility]) CM/SW Contact  Robynn Eileen Hoose, RN Phone Number: 09/11/2024, 3:17 PM  Clinical Narrative:   Secure message from CSW regarding patient wanting information on home health services.  Spoke with patient he is requesting hospital bed and bedside commode with a drop arm.  Patient expressed need for [facility] nurse to come out at least 3 days a week for dressing changes. Spoke with [facility] regarding DME, hospital bed could be delivered by Tuesday and patient can qualify for drop down arm bedside commode.  Referral sent to Amy with [facility] for [facility] services, awaiting response.   [id number]: Amy with [facility] able to accept patient for [facility] services.    Expected Discharge Plan: Home w Home Health Services Barriers to Discharge: Continued Medical Work up               Expected Discharge Plan and Services In-house Referral: Clinical Social Work Discharge Planning Services: CM Consult Post Acute Care Choice:  (TBD) Living arrangements for the past 2 months: Single Family Home                           HH Arranged: IV Antibiotics, RN HH Agency: [facility] Date HH Agency Contacted: 08/25/24   Representative spoke with at [facility] Agency: Pam   Social Drivers of Health (SDOH) Interventions SDOH Screenings   Food Insecurity: Food Insecurity Present (08/21/2024)  Housing: Low Risk  (08/21/2024)  Transportation Needs: No Transportation Needs (08/21/2024)  Utilities: Not At Risk (08/21/2024)  Financial Resource Strain: High Risk (01/24/2021)   Received from [facility] visits prior to 01/10/2023.  Social Connections: Unknown (08/21/2024)  Tobacco Use: High Risk (08/31/2024)    Readmission Risk Interventions     No data to display

## 2024-09-11 NOTE — Progress Notes (Signed)
 [facility]  Date of Admission:  08/16/2024   Total days of inpatient antibiotics ***  Principal Problem:   Cellulitis of left lower extremity Active Problems:   Uncontrolled type 2 diabetes mellitus with hyperglycemia, without long-term current use of insulin  (HCC)   S/P BKA (below knee amputation) unilateral, right (HCC)   Tobacco use disorder   Chronic prescription opiate use - sees pain clinic in [place], [place].   GERD (gastroesophageal reflux disease)   CHF (congestive heart failure) (HCC)   COPD (chronic obstructive pulmonary disease) (HCC)   Osteomyelitis (HCC)   Infected wound   Erythema   MRSA bacteremia          Assessment: 58 year old man with history of prior BKA admitted with MRSA bacteremia due to left lower extremity osteomyelitis requiring BKA received daptomycin Ryan for 2 weeks from negative blood cultures EOT 10/26 ID was reengaged on 10/26 given worsening erythema of lower extremity at BKA site(on ID eval no erythema noted at stump site as such antibiotics were stopped. ID reengaged again for cellulitis  lle #History of MRSA bacteremia secondary to left lower extremity requiring BKA #Left lower extremity cellulitis -Seen by orthopedics noted no drainage from surgical site no wound dehiscence.  Progressive cellulitis of the left residual limb and cellulitis of thigh noted Recommendations: -Blood cultures - Linezolid and Augmentin x 7 days   Microbiology:   Antibiotics: Daptomycin through 10/26 Cultures: Blood 10/8 2 out of 2 MRSA, daptomycin MIC 1 susceptible 10/10 no growth Urine  Other   SUBJECTIVE: *** Interval: Ryan [name] 96.6 in the last 48 hours leukocyte trend has been stable till 10/30  Review of Systems: Review of Systems  All other systems reviewed and are negative.    Scheduled Meds:  bumetanide   1 mg Oral Daily   celecoxib  200 mg Oral Daily   docusate sodium   100 mg Oral Daily   enoxaparin  (LOVENOX) injection  40 mg Subcutaneous Q24H   fenofibrate  54 mg Oral Daily   gabapentin   800 mg Oral TID   insulin  aspart  0-20 Units Subcutaneous TID WC   insulin  aspart  0-5 Units Subcutaneous QHS   insulin  aspart  5 Units Subcutaneous TID WC   insulin  glargine-yfgn  45 Units Subcutaneous BID   irbesartan  300 mg Oral Daily   pantoprazole   40 mg Oral QHS   Continuous Infusions: PRN Meds:.acetaminophen  **OR** acetaminophen , hydrALAZINE , HYDROmorphone  (DILAUDID ) injection, labetalol , melatonin, methocarbamol, metoprolol  tartrate, ondansetron  (ZOFRAN ) IV, oxyCODONE , phenol Allergies  Allergen Reactions   Acetaminophen  Nausea And Vomiting    OBJECTIVE: Vitals:   09/10/24 2031 09/10/24 2332 09/11/24 0413 09/11/24 0801  BP: (!) 181/89 (!) 156/79 (!) 151/73 (!) 146/81  Pulse: 98 97 93 92  Resp: 18  17 18   Temp: 98 F (36.7 C)  98.5 F (36.9 C) (!) 97.5 F (36.4 C)  TempSrc:      SpO2: 97%  96% 95%  Weight:      Height:       Body mass index is 31.38 kg/m.  Physical Exam Constitutional:      General: He is not in acute distress.    Appearance: He is normal weight. He is not toxic-appearing.  HENT:     Head: Normocephalic and atraumatic.     Right Ear: External ear normal.     Left Ear: External ear normal.     Nose: No congestion or rhinorrhea.  Mouth/Throat:     Mouth: Mucous membranes are moist.     Pharynx: Oropharynx is clear.  Eyes:     Extraocular Movements: Extraocular movements intact.     Conjunctiva/sclera: Conjunctivae normal.     Pupils: Pupils are equal, round, and reactive to light.  Cardiovascular:     Rate and Rhythm: Normal rate and regular rhythm.     Heart sounds: No murmur heard.    No friction rub. No gallop.  Pulmonary:     Effort: Pulmonary effort is normal.     Breath sounds: Normal breath sounds.  Abdominal:     General: Abdomen is flat. Bowel sounds are normal.     Palpations: Abdomen is soft.  Musculoskeletal:        General: No  swelling. Normal range of motion.     Cervical back: Normal range of motion and neck supple.  Skin:    General: Skin is warm and dry.  Neurological:     General: No focal deficit present.     Mental Status: He is oriented to person, place, and time.  Psychiatric:        Mood and Affect: Mood normal.       Lab Results Lab Results  Component Value Date   WBC 7.1 09/09/2024   HGB 8.6 (L) 09/09/2024   HCT 29.2 (L) 09/09/2024   MCV 83.9 09/09/2024   PLT 467 (H) 09/09/2024    Lab Results  Component Value Date   CREATININE 1.06 09/09/2024   BUN 26 (H) 09/09/2024   NA 134 (L) 09/09/2024   K 4.4 09/09/2024   CL 99 09/09/2024   CO2 25 09/09/2024    Lab Results  Component Value Date   ALT 23 09/06/2024   AST 22 09/06/2024   ALKPHOS 115 09/06/2024   BILITOT 0.5 09/06/2024        Ryan Stank, MD [facility] 09/11/2024, 1:03 PM

## 2024-09-11 NOTE — Progress Notes (Signed)
 Patient refused to wear Cpap. Nurse is aware

## 2024-09-11 NOTE — TOC Progression Note (Signed)
 Transition of Care [facility]) - Progression Note    Patient Details  Name: Ryan [name] MRN: [id number] Date of Birth: 1966/11/01  Transition of Care [facility]) CM/SW Contact  Isaiah Public, LCSWA Phone Number: 09/11/2024, 2:44 PM  Clinical Narrative:     CSW spoke with [facility] who confirmed facility can offer SNF bed for patient. CSW followed up with patient on dc plan. CSW informed patient. Patient informed CSW he is trying to decide on if he wants to return home vrs. Going to SNF. Patient request to speak with CM about home options before making final decision. CSW informed K[facility] CM. TOC will continue to follow.  Expected Discharge Plan: Home w Home Health Services Barriers to Discharge: Continued Medical Work up               Expected Discharge Plan and Services In-house Referral: Clinical Social Work Discharge Planning Services: CM Consult Post Acute Care Choice:  (TBD) Living arrangements for the past 2 months: Single Family Home                           HH Arranged: IV Antibiotics, RN HH Agency: [facility] Date HH Agency Contacted: 08/25/24   Representative spoke with at [facility] Agency: Pam   Social Drivers of Health (SDOH) Interventions SDOH Screenings   Food Insecurity: Food Insecurity Present (08/21/2024)  Housing: Low Risk  (08/21/2024)  Transportation Needs: No Transportation Needs (08/21/2024)  Utilities: Not At Risk (08/21/2024)  Financial Resource Strain: High Risk (01/24/2021)   Received from [facility] visits prior to 01/10/2023.  Social Connections: Unknown (08/21/2024)  Tobacco Use: High Risk (08/31/2024)    Readmission Risk Interventions     No data to display

## 2024-09-12 ENCOUNTER — Encounter: Payer: Self-pay | Admitting: Radiology

## 2024-09-12 DIAGNOSIS — L03116 Cellulitis of left lower limb: Secondary | ICD-10-CM | POA: Diagnosis not present

## 2024-09-12 LAB — GLUCOSE, CAPILLARY
Glucose-Capillary: 112 mg/dL — ABNORMAL HIGH (ref 70–99)
Glucose-Capillary: 166 mg/dL — ABNORMAL HIGH (ref 70–99)
Glucose-Capillary: 160 mg/dL — ABNORMAL HIGH (ref 70–99)
Glucose-Capillary: 228 mg/dL — ABNORMAL HIGH (ref 70–99)

## 2024-09-12 LAB — CBC WITH DIFFERENTIAL/PLATELET
Abs Immature Granulocytes: 0.04 K/uL (ref 0.00–0.07)
Basophils Absolute: 0.1 K/uL (ref 0.0–0.1)
Basophils Relative: 1 %
Eosinophils Absolute: 0.5 K/uL (ref 0.0–0.5)
Eosinophils Relative: 5 %
HCT: 29.9 % — ABNORMAL LOW (ref 39.0–52.0)
Hemoglobin: 8.9 g/dL — ABNORMAL LOW (ref 13.0–17.0)
Immature Granulocytes: 0 %
Lymphocytes Relative: 19 %
Lymphs Abs: 1.9 K/uL (ref 0.7–4.0)
MCH: 24.7 pg — ABNORMAL LOW (ref 26.0–34.0)
MCHC: 29.8 g/dL — ABNORMAL LOW (ref 30.0–36.0)
MCV: 82.8 fL (ref 80.0–100.0)
Monocytes Absolute: 1.2 K/uL — ABNORMAL HIGH (ref 0.1–1.0)
Monocytes Relative: 12 %
Neutro Abs: 6.1 K/uL (ref 1.7–7.7)
Neutrophils Relative %: 63 %
Platelets: 475 K/uL — ABNORMAL HIGH (ref 150–400)
RBC: 3.61 MIL/uL — ABNORMAL LOW (ref 4.22–5.81)
RDW: 18.9 % — ABNORMAL HIGH (ref 11.5–15.5)
WBC: 9.8 K/uL (ref 4.0–10.5)
nRBC: 0 % (ref 0.0–0.2)

## 2024-09-12 LAB — BASIC METABOLIC PANEL WITH GFR
Anion gap: 10 (ref 5–15)
BUN: 20 mg/dL (ref 6–20)
CO2: 28 mmol/L (ref 22–32)
Calcium: 8.7 mg/dL — ABNORMAL LOW (ref 8.9–10.3)
Chloride: 99 mmol/L (ref 98–111)
Creatinine, Ser: 0.92 mg/dL (ref 0.61–1.24)
GFR, Estimated: >60 mL/min (ref >60)
Glucose, Bld: 121 mg/dL — ABNORMAL HIGH (ref 70–99)
Potassium: 4.2 mmol/L (ref 3.5–5.1)
Sodium: 137 mmol/L (ref 135–145)

## 2024-09-12 LAB — CK: Total CK: 127 U/L (ref 49–397)

## 2024-09-12 MED ORDER — DIPHENHYDRAMINE-ZINC ACETATE 2-0.1 % EX CREA
1.0000 | TOPICAL_CREAM | Freq: Every day | CUTANEOUS | Status: AC | PRN
  Administered 2024-09-13 – 2024-09-14 (×2): 1 via TOPICAL
  Filled 2024-09-12: qty 28

## 2024-09-12 MED ORDER — DIPHENHYDRAMINE HCL 50 MG/ML IJ SOLN
12.5000 mg | Freq: Four times a day (QID) | INTRAMUSCULAR | Status: DC | PRN
Start: 2024-09-12 — End: 2024-09-16
  Administered 2024-09-12 – 2024-09-16 (×5): 12.5 mg via INTRAVENOUS
  Filled 2024-09-12 (×5): qty 1

## 2024-09-12 NOTE — Progress Notes (Signed)
 [facility]  Date of Admission:  08/16/2024      Total days of antibiotics  Daptomycin 11/02          ASSESSMENT: Ryan [name] is a 58 y.o. 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 Harden following and no surgery indications at this time. No fevers/leukocytosis. I asked him to please elevate his legs more often to help with the swelling management.  - continue daptomycin for now (duration pending)   MRSA Bacteremia - BCx (+) 10/07, cleared on 10/10 with daptomycin for treatment. TEE negative. Initially received more broad spectrum therapy for concern over stump infection including LVQ + Metro that ended on 10/14. We outlined a course of Daptomycin through 10/24 with transition to doxycycline  (EOT planned 11/21) though he agreed to transtibial amputation on 10/21 so plan was going to be to stop antibiotics given definitive surgical management.  We received a called back out of concern for new cellulitis to the surgical site.  - bacteremia treated  - no concern for recurrence.  - FU blood cx 11/02  Medication Monitoring -  CK 281 - continue weekly checks while on daptomycin   Isolation Recommendations -  Contact   Disposition -  He previously refused to go to SNF despite his medical teams recommendations.    PLAN: Continue daptomycin for now D/W Dr. Arlice re: dispo planning - if SNF would probably be too hard to coordinate dalvance outpatient infusion timely to get bed. If he decides to D/C home we can do same day discharge appt at in house infusion appt for dalvance (reimbursement red tape...)  He needs to elevate stump to help with swelling.  FU blood cultures    Principal Problem:   Cellulitis of left lower extremity Active Problems:   Uncontrolled type 2 diabetes mellitus with hyperglycemia, without long-term current use of insulin  (HCC)   S/P BKA (below knee amputation) unilateral, right  (HCC)   Tobacco use disorder   Chronic prescription opiate use - sees pain clinic in [place], Ryan [name].   GERD (gastroesophageal reflux disease)   CHF (congestive heart failure) (HCC)   COPD (chronic obstructive pulmonary disease) (HCC)   Osteomyelitis (HCC)   Infected wound   Erythema   MRSA bacteremia    bumetanide   1 mg Oral Daily   celecoxib  200 mg Oral Daily   docusate sodium   100 mg Oral Daily   enoxaparin (LOVENOX) injection  40 mg Subcutaneous Q24H   fenofibrate  54 mg Oral Daily   gabapentin   800 mg Oral TID   insulin  aspart  0-20 Units Subcutaneous TID WC   insulin  aspart  0-5 Units Subcutaneous QHS   insulin  aspart  5 Units Subcutaneous TID WC   insulin  glargine-yfgn  35 Units Subcutaneous BID   irbesartan  300 mg Oral Daily   pantoprazole   40 mg Oral QHS    SUBJECTIVE: Says he can't lift leg up all the time and stay flat in bed.  No fevers / chills Having a lot of muscle cramping in leg.    Review of Systems: Review of Systems  Constitutional: Negative.   Respiratory: Negative.    Cardiovascular:  Positive for leg swelling.  Musculoskeletal:  Positive for joint pain.  Skin:  Negative for rash.    Allergies  Allergen Reactions   Acetaminophen  Nausea And Vomiting    OBJECTIVE: Vitals:   09/11/24 1936 09/12/24  9442 09/12/24 0812 09/12/24 1346  BP: 130/79 132/80 139/81 129/75  Pulse: 95 94 (!) 109 94  Resp: 18 18    Temp: 98.7 F (37.1 C) 98.3 F (36.8 C) 98.5 F (36.9 C) 98.3 F (36.8 C)  TempSrc: Oral Oral Oral Oral  SpO2: 99% 100% 90% 95%  Weight:      Height:       Body mass index is 31.38 kg/m.  Physical Exam Vitals reviewed.  Constitutional:      Appearance: Normal appearance. He is not ill-appearing.  HENT:     Head: Normocephalic.     Mouth/Throat:     Mouth: Mucous membranes are moist.     Pharynx: Oropharynx is clear.  Eyes:     General: No scleral icterus. Cardiovascular:     Rate and Rhythm: Normal rate.  Pulmonary:      Effort: Pulmonary effort is normal.  Musculoskeletal:        General: Normal range of motion.     Cervical back: Normal range of motion.     Comments: Redness seems improved from marked area.   Skin:    Coloration: Skin is not jaundiced or pale.  Neurological:     Mental Status: He is alert and oriented to person, place, and time.  Psychiatric:        Mood and Affect: Mood normal.        Judgment: Judgment normal.      Lab Results Lab Results  Component Value Date   WBC 9.8 09/12/2024   HGB 8.9 (L) 09/12/2024   HCT 29.9 (L) 09/12/2024   MCV 82.8 09/12/2024   PLT 475 (H) 09/12/2024    Lab Results  Component Value Date   CREATININE 0.92 09/12/2024   BUN 20 09/12/2024   NA 137 09/12/2024   K 4.2 09/12/2024   CL 99 09/12/2024   CO2 28 09/12/2024    Lab Results  Component Value Date   ALT 23 09/06/2024   AST 22 09/06/2024   ALKPHOS 115 09/06/2024   BILITOT 0.5 09/06/2024     Microbiology: Recent Results (from the past 240 hours)  Culture, blood (Routine X 2) w Reflex to ID Panel     Status: None (Preliminary result)   Collection Time: 09/11/24  3:44 PM   Specimen: BLOOD RIGHT HAND  Result Value Ref Range Status   Specimen Description BLOOD RIGHT HAND  Final   Special Requests   Final    BOTTLES DRAWN AEROBIC AND ANAEROBIC Blood Culture results may not be optimal due to an inadequate volume of blood received in culture bottles   Culture   Final    NO GROWTH < 24 HOURS Performed at [name], [address], [name], Ryan [name] Ryan [name]    Report Status PENDING  Incomplete  Culture, blood (Routine X 2) w Reflex to ID Panel     Status: None (Preliminary result)   Collection Time: 09/11/24  3:54 PM   Specimen: BLOOD RIGHT HAND  Result Value Ref Range Status   Specimen Description BLOOD RIGHT HAND  Final   Special Requests   Final    BOTTLES DRAWN AEROBIC AND ANAEROBIC Blood Culture results may not be optimal due to an inadequate volume of blood received in  culture bottles   Culture   Final    NO GROWTH < 24 HOURS Performed at [name], [address], [name], Ryan [name] Ryan [name]    Report Status PENDING  Incomplete  Corean Fireman, MSN, NP-C [facility] for Infectious Disease [facility]  [email or web address] Pager: [phone] Office: [phone] [facility] Main Line: [phone] *Secure Chat Communication Welcome  Total Encounter Time: 10 min

## 2024-09-12 NOTE — Progress Notes (Signed)
 PT Cancellation Note  Patient Details Name: MERTON WADLOW MRN: [id number] DOB: 20-Oct-1966   Cancelled Treatment:    Reason Eval/Treat Not Completed: (P) Other (comment), pt declining mobility x2 attempts. Will check back as schedule allows to continue with PT POC.  [name]. PTA Acute Rehabilitation Services Office: [phone]    [name] 09/12/2024, 4:47 PM

## 2024-09-12 NOTE — Progress Notes (Signed)
 PROGRESS NOTE  Ryan [name]  DOB: 1966/04/22  PCP: [name] [name], NP FMW:[id number]  DOA: 08/16/2024  LOS: 26 days  Hospital Day: 28  Subjective: Patient was seen and examined this morning. Sitting up in bed.  Not in distress.  Left thigh swelling seems to be trending down. Afebrile, hemodynamically stable Blood sugar level less than 200 in the last 24 hours Was restarted on IV daptomycin by ID yesterday Labs this morning with normal renal function, WC count normal, hemoglobin 8.9  Brief narrative: Ryan [name] is a 57 y.o. male with PMH significant for OSA, DM2, HTN, HLD, chronic venous insufficiencies, CHF, PAD, chronic smoking, COPD, GERD, diabetic neuropathy, s/p right BKA, chronic opiate use Patient follows up at [facility] for chronic left foot wound. 10/7, patient was sent to the ED from wound care center for evaluation of worsening left foot redness, pain and swelling with purulent drainage for last 2 months.  Left foot x-ray showed left fifth metatarsal joint changes consistent with osteomyelitis.   Admitted to [facility]  Orthopedics consulted  Empiric IV antibiotics was started  Blood culture sent on admission grew MRSA  Patient initially refused left BKA but he later agreed, underwent surgery on 10/22.  Assessment and plan: Left foot osteomyelitis Diabetic foot infection Left BKA stump cellulitis Patient initially refused left BKA but he later agreed, underwent left BKA on 10/22 by Dr. Harden On 10/26 he was found to have increased redness in his stump, ID and orthopedics reengaged, felt to be positional as he is keeping his stump downward for most part of the day.  He was monitored off antibiotics.   10/30, ultrasound duplex of the stump showed ruled out DVT but showed enlarged lymph nodes in the groin.  11/1, seen by Dr. Harden.  Incision site intact without any drainage but given concern of surrounding cellulitis, suggested IV antibiotics.  ID was  reconsulted and IV daptomycin restarted on 11/2 ID to decide on discharge antibiotics So far, no fever.  WBC count continues to remain normal. Recent Labs  Lab 09/06/24 0346 09/07/24 0417 09/08/24 0422 09/09/24 0327 09/12/24 0552  WBC 8.5 9.3 8.1 7.1 9.8   MRSA bacteremia  Blood culture sent on admission grew MRSA  Last blood culture from 10/10 did not show any growth. TEE 10/15 negative for endocarditis Previously daptomycin course on 10/23.  Restarted again on 11/2.  See above  Impaired mobility Bilateral BKA status  Prior right BKA 2024 and now new left BKA Seen by PT.  SNF recommended  Chronic opiate use disorder Diabetic neuropathy PTA meds- Celebrex 200 mg daily, gabapentin  800 mg 3 times daily, oxycodone  15 mg every 4 hours PRN Current pain regimen --- Scheduled: celecoxib 200 mg daily, gabapentin  800 mg 3 times daily --- PRN: Oxycodone  15 mg every 4 hours, IV Dilaudid  every 4 hours, Tylenol , Robaxin 500 mg every 6 hours   Type 2 diabetes mellitus uncontrolled A1c 13.2 on 08/16/2024 PTA meds-Semglee , Premeal insulin , Jardiance, metformin  Blood sugar level has been fluctuating wildly in the last 3 days. Currently better controlled on Semglee  35 units twice daily, scheduled Premeal aspart 5 units 3 times daily, SSI/Accu-Cheks. Diabetes care coordinator consult appreciated. Recent Labs  Lab 09/11/24 1141 09/11/24 1615 09/11/24 2154 09/12/24 0520 09/12/24 1118  GLUCAP 203* 171* 168* 112* 166*   Chronic diastolic CHF HTN Echo 10/10 LVEF 60 to 65%, appears euvolemic.   PTA meds-Toprol  12.5 mg daily, hydralazine  25 mg 3 times daily, Currently on Bumex   1 mg daily, irbesartan 300 mg daily.   Blood pressure and heart rate remains controlled.  PAD HLD Continue fenofibrate.  Not on antiplatelet or anticoagulant  Mild hyponatremia In the setting of diuretic use.  Continue to monitor Recent Labs  Lab 09/06/24 0346 09/07/24 0417 09/08/24 0422 09/09/24 0327  09/12/24 0552  NA 135 134* 133* 134* 137    COPD  chronic daily smoker Respiratory status stable.   Counseled to quit smoking.  But patient prefers to go to SNF that allows him to smoke! Nicotine patch offered  GERD Protonix   OSA Ordered nocturnal CPAP.  He said he uses it at home  Mobility:  PT Orders: Active   PT Follow up Rec: Skilled Nursing-Short Term Rehab (<3 Hours/Day)09/07/2024 1000   Goals of care   Code Status: Full Code     DVT prophylaxis:  enoxaparin (LOVENOX) injection 40 mg Start: 09/06/24 1200 SCD's Start: 08/31/24 1638 SCDs Start: 08/17/24 0224   Antimicrobials: Restarted on IV daptomycin on 11/2 Fluid: None Consultants: Orthopedics, ID Family Communication: None at bedside  Status: Inpatient Level of care:  Med-Surg   Patient is from: Home Needs to continue in-hospital care: ID has been reinvolved again. Anticipated d/c to: Pending clinical course, SNF recommended.  Patient however is just undecided      Diet:  Diet Order             Diet Carb Modified Fluid consistency: Thin; Room service appropriate? Yes  Diet effective now                   Scheduled Meds:  bumetanide   1 mg Oral Daily   celecoxib  200 mg Oral Daily   docusate sodium   100 mg Oral Daily   enoxaparin (LOVENOX) injection  40 mg Subcutaneous Q24H   fenofibrate  54 mg Oral Daily   gabapentin   800 mg Oral TID   insulin  aspart  0-20 Units Subcutaneous TID WC   insulin  aspart  0-5 Units Subcutaneous QHS   insulin  aspart  5 Units Subcutaneous TID WC   insulin  glargine-yfgn  35 Units Subcutaneous BID   irbesartan  300 mg Oral Daily   pantoprazole   40 mg Oral QHS    PRN meds: sodium chloride , acetaminophen  **OR** acetaminophen , hydrALAZINE , HYDROmorphone  (DILAUDID ) injection, labetalol , melatonin, methocarbamol, metoprolol  tartrate, ondansetron  (ZOFRAN ) IV, oxyCODONE , phenol   Infusions:   sodium chloride  10 mL/hr at 09/11/24 1959   DAPTOmycin 700 mg  (09/11/24 2050)    Antimicrobials: Anti-infectives (From admission, onward)    Start     Dose/Rate Route Frequency Ordered Stop   09/11/24 1900  DAPTOmycin (CUBICIN) IVPB 700 mg/128mL premix        700 mg 200 mL/hr over 30 Minutes Intravenous Daily 09/11/24 1738     09/04/24 0900  DAPTOmycin (CUBICIN) IVPB 700 mg/126mL premix  Status:  Discontinued        700 mg 200 mL/hr over 30 Minutes Intravenous Daily 09/04/24 0808 09/04/24 1624   09/03/24 1000  doxycycline  (VIBRA -TABS) tablet 100 mg  Status:  Discontinued        100 mg Oral Every 12 hours 08/25/24 0935 09/04/24 0719   09/03/24 0000  doxycycline  (VIBRA -TABS) 100 MG tablet        100 mg Oral 2 times daily 08/25/24 0938 10/01/24 2359   09/02/24 0800  DAPTOmycin (CUBICIN) IVPB 700 mg/121mL premix        8 mg/kg  91.5 kg (Adjusted) 200 mL/hr over 30 Minutes Intravenous Daily  09/01/24 1107 09/02/24 0920   08/31/24 1300  ceFAZolin  (ANCEF ) IVPB 2g/100 mL premix        2 g 200 mL/hr over 30 Minutes Intravenous On call to O.R. 08/31/24 1258 08/31/24 1543   08/31/24 1300  vancomycin  (VANCOREADY) IVPB 1500 mg/300 mL        1,500 mg 150 mL/hr over 120 Minutes Intravenous On call to O.R. 08/31/24 1258 08/31/24 1543   08/20/24 1500  levofloxacin (LEVAQUIN) tablet 750 mg  Status:  Discontinued        750 mg Oral Daily 08/20/24 1410 08/23/24 0916   08/20/24 1500  metroNIDAZOLE (FLAGYL) tablet 500 mg  Status:  Discontinued        500 mg Oral Every 12 hours 08/20/24 1410 08/23/24 0916   08/18/24 1600  DAPTOmycin (CUBICIN) IVPB 700 mg/162mL premix  Status:  Discontinued        6 mg/kg  108.9 kg 200 mL/hr over 30 Minutes Intravenous Daily 08/18/24 1519 08/18/24 1529   08/18/24 1600  DAPTOmycin (CUBICIN) IVPB 700 mg/114mL premix  Status:  Discontinued        8 mg/kg  91.5 kg (Adjusted) 200 mL/hr over 30 Minutes Intravenous Daily 08/18/24 1529 09/01/24 1106   08/18/24 0930  vancomycin  (VANCOCIN ) IVPB 1000 mg/200 mL premix  Status:  Discontinued        Placed in Followed by Linked Group   1,000 mg 200 mL/hr over 60 Minutes Intravenous Every 12 hours 08/18/24 0916 08/18/24 1519   08/18/24 0715  vancomycin  (VANCOREADY) IVPB 1250 mg/250 mL  Status:  Discontinued       Placed in Followed by Linked Group   1,250 mg 166.7 mL/hr over 90 Minutes Intravenous Every 12 hours 08/17/24 1914 08/18/24 0916   08/17/24 1915  vancomycin  (VANCOREADY) IVPB 2000 mg/400 mL       Placed in Followed by Linked Group   2,000 mg 200 mL/hr over 120 Minutes Intravenous  Once 08/17/24 1914 08/17/24 2303   08/17/24 1000  linezolid (ZYVOX) IVPB 600 mg  Status:  Discontinued        600 mg 300 mL/hr over 60 Minutes Intravenous Every 12 hours 08/17/24 0228 08/17/24 1914   08/17/24 1000  piperacillin-tazobactam (ZOSYN) IVPB 3.375 g  Status:  Discontinued        3.375 g 12.5 mL/hr over 240 Minutes Intravenous Every 8 hours 08/17/24 0303 08/20/24 1409   08/17/24 0200  piperacillin-tazobactam (ZOSYN) IVPB 3.375 g        3.375 g 100 mL/hr over 30 Minutes Intravenous  Once 08/17/24 0158 08/17/24 0216   08/17/24 0200  linezolid (ZYVOX) IVPB 600 mg        600 mg 300 mL/hr over 60 Minutes Intravenous  Once 08/17/24 0158 08/17/24 0415       Objective: Vitals:   09/12/24 0812 09/12/24 1346  BP: 139/81 129/75  Pulse: (!) 109 94  Resp:    Temp: 98.5 F (36.9 C) 98.3 F (36.8 C)  SpO2: 90% 95%    Intake/Output Summary (Last 24 hours) at 09/12/2024 1408 Last data filed at 09/12/2024 0812 Gross per 24 hour  Intake 475.21 ml  Output 1875 ml  Net -1399.79 ml    Filed Weights   08/26/24 0500 09/07/24 1500  Weight: 100.6 kg 107.9 kg   Weight change:  Body mass index is 31.38 kg/m.   Physical Exam: General exam: Pleasant, middle-aged Caucasian male.  Not in any pain at rest Skin: No rashes, lesions or ulcers. HEENT: Atraumatic, normocephalic,  no obvious bleeding Lungs: Clear to auscultation bilaterally,  CVS: S1, S2, no murmur,   GI/Abd: Soft,  nontender, nondistended, bowel sound present,   CNS: Alert, awake, oriented x 3 Psychiatry: Mood appropriate Extremities: Bilateral amputation status.  Improving redness, swelling on left thigh  Data Review: I have personally reviewed the laboratory data and studies available.  F/u labs ordered Unresulted Labs (From admission, onward)     Start     Ordered   09/05/24 0500  CK  Weekly,   R     Question:  Specimen collection method  Answer:  Lab=Lab collect   09/04/24 0834   Unscheduled  CBC with Differential/Platelet  Tomorrow morning,   R       Question:  Specimen collection method  Answer:  Lab=Lab collect   09/12/24 1408   Unscheduled  Basic metabolic panel with GFR  Tomorrow morning,   R       Question:  Specimen collection method  Answer:  Lab=Lab collect   09/12/24 1408            Signed, Chapman Rota, MD [facility] 09/12/2024

## 2024-09-12 NOTE — Plan of Care (Signed)
  Problem: Education: Goal: Ability to describe self-care measures that may prevent or decrease complications (Diabetes Survival Skills Education) will improve Outcome: Progressing   Problem: Coping: Goal: Ability to adjust to condition or change in health will improve Outcome: Progressing   Problem: Fluid Volume: Goal: Ability to maintain a balanced intake and output will improve Outcome: Progressing   Problem: Skin Integrity: Goal: Risk for impaired skin integrity will decrease Outcome: Progressing   Problem: Activity: Goal: Risk for activity intolerance will decrease Outcome: Progressing   Problem: Coping: Goal: Level of anxiety will decrease Outcome: Progressing

## 2024-09-13 DIAGNOSIS — L03116 Cellulitis of left lower limb: Secondary | ICD-10-CM | POA: Diagnosis not present

## 2024-09-13 LAB — CBC WITH DIFFERENTIAL/PLATELET
Abs Immature Granulocytes: 0.04 K/uL (ref 0.00–0.07)
Basophils Absolute: 0.1 K/uL (ref 0.0–0.1)
Basophils Relative: 1 %
Eosinophils Absolute: 0.4 K/uL (ref 0.0–0.5)
Eosinophils Relative: 4 %
HCT: 28.5 % — ABNORMAL LOW (ref 39.0–52.0)
Hemoglobin: 8.4 g/dL — ABNORMAL LOW (ref 13.0–17.0)
Immature Granulocytes: 0 %
Lymphocytes Relative: 15 %
Lymphs Abs: 1.4 K/uL (ref 0.7–4.0)
MCH: 24.6 pg — ABNORMAL LOW (ref 26.0–34.0)
MCHC: 29.5 g/dL — ABNORMAL LOW (ref 30.0–36.0)
MCV: 83.6 fL (ref 80.0–100.0)
Monocytes Absolute: 0.9 K/uL (ref 0.1–1.0)
Monocytes Relative: 10 %
Neutro Abs: 6.5 K/uL (ref 1.7–7.7)
Neutrophils Relative %: 70 %
Platelets: 430 K/uL — ABNORMAL HIGH (ref 150–400)
RBC: 3.41 MIL/uL — ABNORMAL LOW (ref 4.22–5.81)
RDW: 18.8 % — ABNORMAL HIGH (ref 11.5–15.5)
WBC: 9.3 K/uL (ref 4.0–10.5)
nRBC: 0 % (ref 0.0–0.2)

## 2024-09-13 LAB — BASIC METABOLIC PANEL WITH GFR
Anion gap: 6 (ref 5–15)
BUN: 22 mg/dL — ABNORMAL HIGH (ref 6–20)
CO2: 30 mmol/L (ref 22–32)
Calcium: 8.5 mg/dL — ABNORMAL LOW (ref 8.9–10.3)
Chloride: 99 mmol/L (ref 98–111)
Creatinine, Ser: 1.09 mg/dL (ref 0.61–1.24)
GFR, Estimated: >60 mL/min (ref >60)
Glucose, Bld: 230 mg/dL — ABNORMAL HIGH (ref 70–99)
Potassium: 4.5 mmol/L (ref 3.5–5.1)
Sodium: 135 mmol/L (ref 135–145)

## 2024-09-13 LAB — GLUCOSE, CAPILLARY
Glucose-Capillary: 197 mg/dL — ABNORMAL HIGH (ref 70–99)
Glucose-Capillary: 271 mg/dL — ABNORMAL HIGH (ref 70–99)
Glucose-Capillary: 194 mg/dL — ABNORMAL HIGH (ref 70–99)
Glucose-Capillary: 216 mg/dL — ABNORMAL HIGH (ref 70–99)

## 2024-09-13 MED ORDER — INSULIN GLARGINE-YFGN 100 UNIT/ML ~~LOC~~ SOLN
40.0000 [IU] | Freq: Two times a day (BID) | SUBCUTANEOUS | Status: DC
  Administered 2024-09-13 – 2024-09-18 (×9): 40 [IU] via SUBCUTANEOUS
  Filled 2024-09-13 (×13): qty 0.4

## 2024-09-13 MED ORDER — ENOXAPARIN SODIUM 60 MG/0.6ML IJ SOSY
60.0000 mg | PREFILLED_SYRINGE | INTRAMUSCULAR | Status: DC
  Filled 2024-09-13 (×3): qty 0.6

## 2024-09-13 MED ORDER — FUROSEMIDE 10 MG/ML IJ SOLN
40.0000 mg | Freq: Once | INTRAMUSCULAR | Status: AC
  Administered 2024-09-13: 40 mg via INTRAVENOUS
  Filled 2024-09-13: qty 4

## 2024-09-13 MED ORDER — METOPROLOL SUCCINATE ER 25 MG PO TB24
12.5000 mg | ORAL_TABLET | Freq: Every day | ORAL | Status: DC
  Administered 2024-09-13 – 2024-09-16 (×4): 12.5 mg via ORAL
  Filled 2024-09-13 (×4): qty 1

## 2024-09-13 NOTE — Progress Notes (Signed)
 Patient ID: Ryan [name], male   DOB: 1965-12-04, 58 y.o.   MRN: [id number] Patient is seen in follow-up for the left transtibial amputation.  Patient is sitting in bed with his legs dependent.  The cellulitis in the left leg is resolving, he still has significant swelling.  Patient states that he has a history of fluid retention.  Patient states he has swelling in both lower extremities.  Continue antibiotics, discussed the importance of elevation.

## 2024-09-13 NOTE — Plan of Care (Signed)

## 2024-09-13 NOTE — Progress Notes (Signed)
 Physical Therapy Treatment Patient Details Name: Ryan [name] MRN: [id number] DOB: 1966-05-04 Today's Date: 09/13/2024   History of Present Illness Pt is a 58 y.o male admitted 08/16/24 for hyperglycemia and diabetic foot infection. Left foot x-ray shows fifth metatarsal joint changes consistent with osteomyelitis. MRI demonstrated osteomyelitis/septic arthritis of the underlying left fifth proximal phalanx and mid to distal fifth metatarsal. Blood cultures with MRSA. Repeat blood culture negative.  TTE and TEE negative for vegetation. Pt s/p L BKA and wound vac application 10/22.  PMHx: R BKA, uncontrolled T2DM, chronic pain syndrome, CHF, COPD, PVD, and tobacco use.    PT Comments  Pt resting in bed on arrival, agreeable to bed level session with focus on increased LLE ROM and strength. Continued education on importance of resting in positions to maximize LLE extension as well as elevation of LLE for edema management. Pt performing supine exercises for increased ROM and strength maintenance and agreeable to trial of LLE elevated on air cushion at end of session. Pt declining all EOB and OOB activity this session. Pt continues to benefit from skilled PT services to progress toward functional mobility goals.      If plan is discharge home, recommend the following: A little help with walking and/or transfers;A little help with bathing/dressing/bathroom;Assistance with cooking/housework;Assist for transportation;Help with stairs or ramp for entrance   Can travel by private vehicle     No  Equipment Recommendations  Wheelchair (measurements PT);Wheelchair cushion (measurements PT);BSC/3in1;Hospital bed    Recommendations for Other Services       Precautions / Restrictions Precautions Precautions: Fall Recall of Precautions/Restrictions: Intact Precaution/Restrictions Comments: Educated Pt on importance of knee position following L BKA to prevent contracture and reduce swelling Required  Braces or Orthoses: Other Brace Other Brace: LLE Limb guard, Pt declining to wear Restrictions Weight Bearing Restrictions Per Provider Order: Yes LLE Weight Bearing Per Provider Order: Non weight bearing     Mobility  Bed Mobility Overal bed mobility: Needs Assistance             General bed mobility comments: pt maintaining supine throughout session    Transfers                   General transfer comment: pt declining OOB transfer    Ambulation/Gait                   Stairs             Wheelchair Mobility     Tilt Bed    Modified Rankin (Stroke Patients Only)       Balance Overall balance assessment: Needs assistance Sitting-balance support: No upper extremity supported Sitting balance-Leahy Scale: Good Sitting balance - Comments: Pt sat EOB with supervision and maintained dynamic sitting balance       Standing balance comment: Pt declined stand attempt with prosthetic.                            Communication Communication Communication: No apparent difficulties  Cognition Arousal: Alert Behavior During Therapy: WFL for tasks assessed/performed                             Following commands: Intact      Cueing Cueing Techniques: Verbal cues  Exercises Amputee Exercises Quad Sets: AROM, Left, 10 reps, Supine Knee Extension: AROM, Left, 5 reps Straight Leg Raises: AROM, Left, 10  reps, Supine    General Comments General comments (skin integrity, edema, etc.): contineud education on elevation of bil LE and extension of L knee with bed adjusted to maximize extension with L distal end of residual LLE palced on air pillow to encourage both extension and elevation      Pertinent Vitals/Pain Pain Assessment Pain Assessment: Faces Faces Pain Scale: Hurts a little bit Pain Location: LLE Pain Descriptors / Indicators: Discomfort, Guarding, Grimacing Pain Intervention(s): Monitored during session, Limited  activity within patient's tolerance    Home Living                          Prior Function            PT Goals (current goals can now be found in the care plan section) Acute Rehab PT Goals PT Goal Formulation: With patient Time For Goal Achievement: 09/15/24 Progress towards PT goals: Progressing toward goals    Frequency    Min 2X/week      PT Plan      Co-evaluation              AM-PAC PT 6 Clicks Mobility   Outcome Measure  Help needed turning from your back to your side while in a flat bed without using bedrails?: A Little Help needed moving from lying on your back to sitting on the side of a flat bed without using bedrails?: A Little Help needed moving to and from a bed to a chair (including a wheelchair)?: A Little Help needed standing up from a chair using your arms (e.g., wheelchair or bedside chair)?: Total Help needed to walk in hospital room?: Total Help needed climbing 3-5 steps with a railing? : Total 6 Click Score: 12    End of Session   Activity Tolerance: Patient tolerated treatment well Patient left: in bed;with call bell/phone within reach Nurse Communication: Mobility status PT Visit Diagnosis: Difficulty in walking, not elsewhere classified (R26.2);Pain;Other abnormalities of gait and mobility (R26.89);Unsteadiness on feet (R26.81) Pain - Right/Left: Left Pain - part of body: Knee;Leg     Time: 8594-8580 PT Time Calculation (min) (ACUTE ONLY): 14 min  Charges:    $Therapeutic Exercise: 8-22 mins PT General Charges $$ ACUTE PT VISIT: 1 Visit                     Denis Koppel R. PTA Acute Rehabilitation Services Office: [phone]   [name] 09/13/2024, 4:32 PM

## 2024-09-13 NOTE — Progress Notes (Signed)
 Occupational Therapy Treatment Patient Details Name: Ryan [name] MRN: [id number] DOB: July 31, 1966 Today's Date: 09/13/2024   History of present illness Pt is a 58 y.o male admitted 08/16/24 for hyperglycemia and diabetic foot infection. Left foot x-ray shows fifth metatarsal joint changes consistent with osteomyelitis. MRI demonstrated osteomyelitis/septic arthritis of the underlying left fifth proximal phalanx and mid to distal fifth metatarsal. Blood cultures with MRSA. Repeat blood culture negative.  TTE and TEE negative for vegetation. Pt s/p L BKA and wound vac application 10/22.  PMHx: R BKA, uncontrolled T2DM, chronic pain syndrome, CHF, COPD, PVD, and tobacco use.   OT comments  Pt is progressing towards established goals this session. Focus of session on progressing functional mobility and increasing independence in ADL tasks. Pt educated on transfer compensatory strategies. Pt required supervision for functional transfer and requires up to Mod A for ADL tasks. OT to continue to follow Pt acutely to facilitate continued progress towards goals. Current OT d/c recommendation remains appropriate.       If plan is discharge home, recommend the following:  A lot of help with walking and/or transfers;A little help with bathing/dressing/bathroom;Assistance with cooking/housework;Assist for transportation;Help with stairs or ramp for entrance   Equipment Recommendations  Other (comment);Wheelchair (measurements OT);BSC/3in1;Tub/shower seat (drop arm BSC)    Recommendations for Other Services      Precautions / Restrictions Precautions Precautions: Fall Recall of Precautions/Restrictions: Intact Precaution/Restrictions Comments: Educated Pt on importance of knee position following L BKA to prevent contracture and reduce swelling Required Braces or Orthoses: Other Brace Other Brace: LLE Limb guard, Pt declining to wear Restrictions Weight Bearing Restrictions Per Provider Order: Yes LLE  Weight Bearing Per Provider Order: Non weight bearing       Mobility Bed Mobility Overal bed mobility: Needs Assistance Bed Mobility: Supine to Sit     Supine to sit: Supervision, HOB elevated     General bed mobility comments: Pt greeted seated EOB on R side of bed    Transfers Overall transfer level: Needs assistance Equipment used:  (transport chair) Transfers: Bed to chair/wheelchair/BSC            Lateral/Scoot Transfers: Supervision General transfer comment: Pt requries supervision for lateral scoot transfers, states he cannot A-P transfer as effectively. Pt also reports that he cannot lateral scoot from wheelchair at home as wheel is too tall. Pt expressed concerns transferring to car.     Balance Overall balance assessment: Needs assistance Sitting-balance support: No upper extremity supported Sitting balance-Leahy Scale: Good Sitting balance - Comments: Pt sat EOB with supervision and maintained dynamic sitting balance       Standing balance comment: Pt declined stand attempt with prosthetic.                           ADL either performed or assessed with clinical judgement   ADL Overall ADL's : Needs assistance/impaired                     Lower Body Dressing: Moderate assistance;Sitting/lateral leans Lower Body Dressing Details (indicate cue type and reason): don prosthetic Toilet Transfer: Supervision/safety;Requires drop arm;Regular Teacher, Adult Education Details (indicate cue type and reason): Pt lateral scoot to transport chair and then lateral scoot to commode in bathroom.           General ADL Comments: Pt continues using bedside urinals    Extremity/Trunk Assessment Upper Extremity Assessment Upper Extremity Assessment: Overall WFL for tasks assessed  Vision   Vision Assessment?: No apparent visual deficits   Perception     Praxis     Communication Communication Communication: No apparent  difficulties   Cognition Arousal: Alert Behavior During Therapy: WFL for tasks assessed/performed Cognition: No apparent impairments             OT - Cognition Comments: Pt with self-limiting behaviors                 Following commands: Intact        Cueing   Cueing Techniques: Verbal cues  Exercises      Shoulder Instructions       General Comments L residual limb swelling noted. Pt states that R residual limb is too swollen to comfortably wear prosthetic. Pt educated on use of prosthetic to stand-pivot transfer to home wheelchair and to get in car. Pt stated he could not stand on prosthetic.    Pertinent Vitals/ Pain       Pain Assessment Pain Assessment: Faces Faces Pain Scale: Hurts a little bit Pain Location: generalized, back Pain Descriptors / Indicators: Discomfort, Guarding, Grimacing Pain Intervention(s): Premedicated before session, Limited activity within patient's tolerance  Home Living                                          Prior Functioning/Environment              Frequency  Min 2X/week        Progress Toward Goals  OT Goals(current goals can now be found in the care plan section)  Progress towards OT goals: Progressing toward goals  Acute Rehab OT Goals Patient Stated Goal: to decrease pain OT Goal Formulation: With patient Time For Goal Achievement: 09/15/24 Potential to Achieve Goals: Good ADL Goals Pt Will Perform Grooming: Independently;sitting Pt Will Perform Lower Body Bathing: with supervision;with adaptive equipment;sitting/lateral leans Pt Will Perform Lower Body Dressing: with supervision;with adaptive equipment;sitting/lateral leans Pt Will Transfer to Toilet: with contact guard assist;bedside commode;with transfer board Pt Will Perform Tub/Shower Transfer: Shower transfer;3 in 1;with transfer board;with contact guard assist  Plan      Co-evaluation                 AM-PAC OT 6  Clicks Daily Activity     Outcome Measure   Help from another person eating meals?: None Help from another person taking care of personal grooming?: None Help from another person toileting, which includes using toliet, bedpan, or urinal?: A Little Help from another person bathing (including washing, rinsing, drying)?: A Little Help from another person to put on and taking off regular upper body clothing?: None Help from another person to put on and taking off regular lower body clothing?: A Lot 6 Click Score: 20    End of Session Equipment Utilized During Treatment:  (transport chair)  OT Visit Diagnosis: Other abnormalities of gait and mobility (R26.89);Muscle weakness (generalized) (M62.81);Pain Pain - Right/Left: Left Pain - part of body: Leg   Activity Tolerance Patient limited by pain   Patient Left in bed;with call bell/phone within reach   Nurse Communication          Time: 8882-8870 OT Time Calculation (min): 12 min  Charges: OT General Charges $OT Visit: 1 Visit OT Treatments $Therapeutic Activity: 8-22 mins  Maurilio CROME, OTR/L.  [facility] Acute Rehabilitation  Office: [phone]   Maurilio PARAS Chett Taniguchi  09/13/2024, 11:47 AM

## 2024-09-13 NOTE — Progress Notes (Signed)
 PROGRESS NOTE  Ryan [name]  DOB: [birth year]  PCP: [name] [name], NP FMW:[id number]  DOA: 08/16/2024  LOS: 27 days  Hospital Day: 29  Subjective: Patient was seen and examined this morning. Sitting up in bed.  Not in distress. He is concerned about feeling of volume retention.  There are some edema appreciable in the left thigh.  He feels his abdomen is distended as well. Afebrile, hemodynamically stable Blood sugar level fluctuating, was over 200 last night, fasting 197 this morning WBC count normal  Brief narrative: Ryan [name] is a 58 y.o. male with PMH significant for OSA, DM2, HTN, HLD, chronic venous insufficiencies, CHF, PAD, chronic smoking, COPD, GERD, diabetic neuropathy, s/p right BKA, chronic opiate use Patient follows up at [facility] wound center for chronic left foot wound. 10/7, patient was sent to the ED from wound care center for evaluation of worsening left foot redness, pain and swelling with purulent drainage for last 2 months.  Left foot x-ray showed left fifth metatarsal joint changes consistent with osteomyelitis.   Admitted to [facility]  Orthopedics consulted  Empiric IV antibiotics was started  Blood culture sent on admission grew MRSA  Patient initially refused left BKA but he later agreed, underwent surgery on 10/22.  Assessment and plan: Left foot osteomyelitis Diabetic foot infection Left BKA stump cellulitis Patient initially refused left BKA but he later agreed, underwent left BKA on 10/22 by Dr. Harden On 10/26 he was found to have increased redness in his stump, ID and orthopedics reengaged, felt to be positional as he is keeping his stump downward for most part of the day.  He was monitored off antibiotics.   10/30, ultrasound duplex of the stump showed ruled out DVT but showed enlarged lymph nodes in the groin.  11/1, seen by Dr. Harden.  Incision site intact without any drainage but given concern of surrounding cellulitis, suggested IV  antibiotics.  ID was reconsulted and IV daptomycin restarted on 11/2 On exam, cellulitis seems to be shrinking in extent. ID to decide on discharge antibiotics So far, no fever.  WBC count continues to remain normal. Recent Labs  Lab 09/07/24 0417 09/08/24 0422 09/09/24 0327 09/12/24 0552 09/13/24 0509  WBC 9.3 8.1 7.1 9.8 9.3   MRSA bacteremia  Blood culture sent on admission grew MRSA  Last blood culture from 10/10 did not show any growth. TEE 10/15 negative for endocarditis Previously daptomycin course on 10/23.  Restarted again on 11/2.  See above.  Impaired mobility Bilateral BKA status  Prior right BKA 2024 and now new left BKA Seen by PT.  SNF recommended  Chronic opiate use disorder Diabetic neuropathy PTA meds- Celebrex 200 mg daily, gabapentin  800 mg 3 times daily, oxycodone  15 mg every 4 hours PRN Current pain regimen --- Scheduled: celecoxib 200 mg daily, gabapentin  800 mg 3 times daily --- PRN: Oxycodone  15 mg every 4 hours, IV Dilaudid  every 4 hours, Tylenol , Robaxin 500 mg every 6 hours   Type 2 diabetes mellitus uncontrolled A1c 13.2 on 08/16/2024 PTA meds-Semglee , Premeal insulin , Jardiance, metformin  Blood sugar level has been fluctuating wildly in the last few days. Currently better on Semglee  35 units twice daily, scheduled Premeal aspart 5 units 3 times daily, SSI/Accu-Cheks.  Increase Semglee  to 40 units twice daily Diabetes care coordinator consult appreciated. Recent Labs  Lab 09/12/24 1118 09/12/24 1633 09/12/24 2131 09/13/24 0545 09/13/24 1134  GLUCAP 166* 160* 228* 197* 271*   Chronic diastolic CHF HTN Echo 10/10 LVEF 60 to  65%, appears euvolemic.   Currently on Bumex  1 mg daily, irbesartan 300 mg daily.   Blood pressure controlled.  Heart rate elevated in 90s at baseline.  Resume Toprol  12.5 g daily  Patient is concerned about worsening swelling.  I will give him 1 dose of IV Lasix  40 mg today.  Continue to monitor  PAD HLD Continue  fenofibrate.  Not on antiplatelet or anticoagulant  Mild hyponatremia In the setting of diuretic use.  Continue to monitor Recent Labs  Lab 09/07/24 0417 09/08/24 0422 09/09/24 0327 09/12/24 0552 09/13/24 0509  NA 134* 133* 134* 137 135    COPD  chronic daily smoker Respiratory status stable.   Counseled to quit smoking.  But patient prefers to go to SNF that allows him to smoke! Nicotine patch offered  GERD Protonix   OSA Nocturnal CPAP  Mobility:  PT Orders: Active   PT Follow up Rec: Skilled Nursing-Short Term Rehab (<3 Hours/Day)09/07/2024 1000   Goals of care   Code Status: Full Code     DVT prophylaxis:  SCD's Start: 08/31/24 1638 SCDs Start: 08/17/24 0224   Antimicrobials: Restarted on IV daptomycin on 11/2 Fluid: None Consultants: Orthopedics, ID Family Communication: None at bedside  Status: Inpatient Level of care:  Med-Surg   Patient is from: Home Needs to continue in-hospital care: ID has been reinvolved again. Anticipated d/c to: Pending clinical course, SNF recommended.  Patient however is just undecided      Diet:  Diet Order             Diet Carb Modified Fluid consistency: Thin; Room service appropriate? Yes  Diet effective now                   Scheduled Meds:  bumetanide   1 mg Oral Daily   celecoxib  200 mg Oral Daily   docusate sodium   100 mg Oral Daily   enoxaparin (LOVENOX) injection  60 mg Subcutaneous Q24H   fenofibrate  54 mg Oral Daily   furosemide   40 mg Intravenous Once   gabapentin   800 mg Oral TID   insulin  aspart  0-20 Units Subcutaneous TID WC   insulin  aspart  0-5 Units Subcutaneous QHS   insulin  aspart  5 Units Subcutaneous TID WC   insulin  glargine-yfgn  40 Units Subcutaneous BID   irbesartan  300 mg Oral Daily   metoprolol  succinate  12.5 mg Oral Daily   pantoprazole   40 mg Oral QHS    PRN meds: acetaminophen  **OR** acetaminophen , diphenhydrAMINE, diphenhydrAMINE-zinc  acetate, hydrALAZINE ,  HYDROmorphone  (DILAUDID ) injection, labetalol , melatonin, methocarbamol, metoprolol  tartrate, ondansetron  (ZOFRAN ) IV, oxyCODONE , phenol   Infusions:   DAPTOmycin 700 mg (09/12/24 1540)    Antimicrobials: Anti-infectives (From admission, onward)    Start     Dose/Rate Route Frequency Ordered Stop   09/11/24 1900  DAPTOmycin (CUBICIN) IVPB 700 mg/161mL premix        700 mg 200 mL/hr over 30 Minutes Intravenous Daily 09/11/24 1738     09/04/24 0900  DAPTOmycin (CUBICIN) IVPB 700 mg/155mL premix  Status:  Discontinued        700 mg 200 mL/hr over 30 Minutes Intravenous Daily 09/04/24 0808 09/04/24 1624   09/03/24 1000  doxycycline  (VIBRA -TABS) tablet 100 mg  Status:  Discontinued        100 mg Oral Every 12 hours 08/25/24 0935 09/04/24 0719   09/03/24 0000  doxycycline  (VIBRA -TABS) 100 MG tablet        100 mg Oral 2 times daily 08/25/24  9061 10/01/24 2359   09/02/24 0800  DAPTOmycin (CUBICIN) IVPB 700 mg/170mL premix        8 mg/kg  91.5 kg (Adjusted) 200 mL/hr over 30 Minutes Intravenous Daily 09/01/24 1107 09/02/24 0920   08/31/24 1300  ceFAZolin  (ANCEF ) IVPB 2g/100 mL premix        2 g 200 mL/hr over 30 Minutes Intravenous On call to O.R. 08/31/24 1258 08/31/24 1543   08/31/24 1300  vancomycin  (VANCOREADY) IVPB 1500 mg/300 mL        1,500 mg 150 mL/hr over 120 Minutes Intravenous On call to O.R. 08/31/24 1258 08/31/24 1543   08/20/24 1500  levofloxacin (LEVAQUIN) tablet 750 mg  Status:  Discontinued        750 mg Oral Daily 08/20/24 1410 08/23/24 0916   08/20/24 1500  metroNIDAZOLE (FLAGYL) tablet 500 mg  Status:  Discontinued        500 mg Oral Every 12 hours 08/20/24 1410 08/23/24 0916   08/18/24 1600  DAPTOmycin (CUBICIN) IVPB 700 mg/14mL premix  Status:  Discontinued        6 mg/kg  108.9 kg 200 mL/hr over 30 Minutes Intravenous Daily 08/18/24 1519 08/18/24 1529   08/18/24 1600  DAPTOmycin (CUBICIN) IVPB 700 mg/155mL premix  Status:  Discontinued        8 mg/kg  91.5 kg  (Adjusted) 200 mL/hr over 30 Minutes Intravenous Daily 08/18/24 1529 09/01/24 1106   08/18/24 0930  vancomycin  (VANCOCIN ) IVPB 1000 mg/200 mL premix  Status:  Discontinued       Placed in Followed by Linked Group   1,000 mg 200 mL/hr over 60 Minutes Intravenous Every 12 hours 08/18/24 0916 08/18/24 1519   08/18/24 0715  vancomycin  (VANCOREADY) IVPB 1250 mg/250 mL  Status:  Discontinued       Placed in Followed by Linked Group   1,250 mg 166.7 mL/hr over 90 Minutes Intravenous Every 12 hours 08/17/24 1914 08/18/24 0916   08/17/24 1915  vancomycin  (VANCOREADY) IVPB 2000 mg/400 mL       Placed in Followed by Linked Group   2,000 mg 200 mL/hr over 120 Minutes Intravenous  Once 08/17/24 1914 08/17/24 2303   08/17/24 1000  linezolid (ZYVOX) IVPB 600 mg  Status:  Discontinued        600 mg 300 mL/hr over 60 Minutes Intravenous Every 12 hours 08/17/24 0228 08/17/24 1914   08/17/24 1000  piperacillin-tazobactam (ZOSYN) IVPB 3.375 g  Status:  Discontinued        3.375 g 12.5 mL/hr over 240 Minutes Intravenous Every 8 hours 08/17/24 0303 08/20/24 1409   08/17/24 0200  piperacillin-tazobactam (ZOSYN) IVPB 3.375 g        3.375 g 100 mL/hr over 30 Minutes Intravenous  Once 08/17/24 0158 08/17/24 0216   08/17/24 0200  linezolid (ZYVOX) IVPB 600 mg        600 mg 300 mL/hr over 60 Minutes Intravenous  Once 08/17/24 0158 08/17/24 0415       Objective: Vitals:   09/13/24 0749 09/13/24 1412  BP: (!) 154/78 137/81  Pulse: 98 98  Resp: 16 16  Temp: 98 F (36.7 C) 98.4 F (36.9 C)  SpO2: 94% 99%    Intake/Output Summary (Last 24 hours) at 09/13/2024 1438 Last data filed at 09/13/2024 1100 Gross per 24 hour  Intake 720 ml  Output 2400 ml  Net -1680 ml    Filed Weights   09/07/24 1500 09/13/24 0500  Weight: 107.9 kg 110.5 kg   Weight  change:  Body mass index is 32.14 kg/m.   Physical Exam: General exam: Pleasant, middle-aged Caucasian male.  Not in any pain at rest. Skin: No  rashes, lesions or ulcers. HEENT: Atraumatic, normocephalic, no obvious bleeding Lungs: Clear to auscultation bilaterally,  CVS: S1, S2, no murmur,   GI/Abd: Soft, nontender, nondistended, bowel sound present,   CNS: Alert, awake, oriented x 3 Psychiatry: Mood appropriate Extremities: Bilateral amputation status.  Improving redness, swelling on left thigh  Data Review: I have personally reviewed the laboratory data and studies available.  F/u labs ordered Unresulted Labs (From admission, onward)     Start     Ordered   09/05/24 0500  CK  Weekly,   R     Question:  Specimen collection method  Answer:  Lab=Lab collect   09/04/24 0834            Signed, Chapman Rota, MD [facility] 09/13/2024

## 2024-09-14 ENCOUNTER — Other Ambulatory Visit (HOSPITAL_COMMUNITY): Payer: Self-pay

## 2024-09-14 DIAGNOSIS — L03116 Cellulitis of left lower limb: Secondary | ICD-10-CM | POA: Diagnosis not present

## 2024-09-14 LAB — GLUCOSE, CAPILLARY
Glucose-Capillary: 149 mg/dL — ABNORMAL HIGH (ref 70–99)
Glucose-Capillary: 141 mg/dL — ABNORMAL HIGH (ref 70–99)
Glucose-Capillary: 200 mg/dL — ABNORMAL HIGH (ref 70–99)
Glucose-Capillary: 193 mg/dL — ABNORMAL HIGH (ref 70–99)

## 2024-09-14 MED ORDER — FUROSEMIDE 10 MG/ML IJ SOLN
20.0000 mg | Freq: Once | INTRAMUSCULAR | Status: AC
  Administered 2024-09-14: 20 mg via INTRAVENOUS
  Filled 2024-09-14: qty 2

## 2024-09-14 MED ORDER — LINEZOLID 600 MG PO TABS
600.0000 mg | ORAL_TABLET | Freq: Two times a day (BID) | ORAL | 0 refills | Status: DC
Start: 2024-09-15 — End: 2024-09-19
  Filled 2024-09-14: qty 20, 10d supply, fill #0

## 2024-09-14 NOTE — Progress Notes (Addendum)
   09/14/24 1700  Wound 08/31/24 1531 Surgical Closed Surgical Incision Leg Left  Date First Assessed/Time First Assessed: 08/31/24 1531   Primary Wound Type: Surgical  Secondary Wound Type - Surgical: Closed Surgical Incision  Location: Leg  Location Orientation: Left  Site / Wound Assessment Pink;Red;Painful;Bleeding  Closure Staples  Drainage Description Purulent;Serosanguineous  Drainage Amount Moderate  Dressing Type Gauze (Comment)  Dressing Changed Changed  Dressing Status Clean, Dry, Intact  Margins Unattached edges (unapproximated)  Treatment Cleansed   Updated photos of incision available under media tab. [place]ahal, MD made aware. Patient insists dressing to be changed over trash can, educated on importance of keeping wound and dressing clean.

## 2024-09-14 NOTE — Progress Notes (Addendum)
 [facility]  Date of Admission:  08/16/2024      Total days of antibiotics  Daptomycin 11/02          ASSESSMENT: Ryan [name] is a 58 y.o. 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 Harden following and no surgery indications at this time. No fevers/leukocytosis. I asked him to please elevate his legs more often to help with the swelling management. He continues to be swollen and states the diuretics are not keeping this under control.  He prefers the IV dose to avoid having to take pills. Will see if this is doable  - continue daptomycin for now - given his degree of edema present he probably needs 10 day course  - would like to get him a dose of dalvance in the infusion clinic arranged on same day of discharge   MRSA Bacteremia - BCx (+) 10/07, cleared on 10/10 with daptomycin for treatment. TEE negative. Initially received more broad spectrum therapy for concern over stump infection including LVQ + Metro that ended on 10/14. We outlined a course of Daptomycin through 10/24 with transition to doxycycline  (EOT planned 11/21) though he agreed to transtibial amputation on 10/21 so plan was going to be to stop antibiotics given definitive surgical management.  We received a called back out of concern for new cellulitis to the surgical site.  - bacteremia treated  - repeat blood cultures 11/3 remain negative    Medication Monitoring -  CK 281 - continue weekly checks while on daptomycin   Isolation Recommendations -  Contact isolation   Disposition -  He previously refused to go to SNF - he says he is waiting for a medical bed to be delivered to his home.    PLAN: Continue daptomycin for now (day 4 today) Will reach out to Dr. Arlice and [facility] to see if anyone has an idea as to when this medical bed to home may happen.  Would like to coordinate same day discharge Dalvance administration in [facility]   ADDENDUM -  Delays in getting medical bed to house. We will not be able to coordinate dalvance infusion with too many pending variables. Will continue daptomycin and then give 10 days of linezolid at discharge 600 mg PO tabs BID. D/W TOC and Primary team   ID will sign off - please call back with any questions/concerns or if we can be of further assistance.     Principal Problem:   Cellulitis of left lower extremity Active Problems:   Uncontrolled type 2 diabetes mellitus with hyperglycemia, without long-term current use of insulin  (HCC)   S/P BKA (below knee amputation) unilateral, right (HCC)   Tobacco use disorder   Chronic prescription opiate use - sees pain clinic in [place], [place].   GERD (gastroesophageal reflux disease)   CHF (congestive heart failure) (HCC)   COPD (chronic obstructive pulmonary disease) (HCC)   Osteomyelitis (HCC)   Infected wound   Erythema   MRSA bacteremia    bumetanide   1 mg Oral Daily   celecoxib  200 mg Oral Daily   docusate sodium   100 mg Oral Daily   enoxaparin (LOVENOX) injection  60 mg Subcutaneous Q24H   fenofibrate  54 mg Oral Daily   gabapentin   800 mg Oral TID   insulin  aspart  0-20 Units Subcutaneous TID WC   insulin  aspart  0-5 Units Subcutaneous QHS  insulin  aspart  5 Units Subcutaneous TID WC   insulin  glargine-yfgn  40 Units Subcutaneous BID   irbesartan  300 mg Oral Daily   metoprolol  succinate  12.5 mg Oral Daily   pantoprazole   40 mg Oral QHS    SUBJECTIVE: Refusing the blood thinners d/t bleeding from stump. States only bloody drainage from dressing changes  No F/C   Review of Systems: Review of Systems  Constitutional: Negative.   Respiratory: Negative.    Cardiovascular:  Positive for leg swelling.  Musculoskeletal:  Positive for joint pain.  Skin:  Negative for rash.    Allergies  Allergen Reactions   Acetaminophen  Nausea And Vomiting    OBJECTIVE: Vitals:   09/13/24 1412 09/13/24 2015 09/14/24  0359 09/14/24 0838  BP: 137/81 115/62 126/68 (!) 120/59  Pulse: 98 88 80 84  Resp: 16   12  Temp: 98.4 F (36.9 C) 98.7 F (37.1 C) 98.1 F (36.7 C) 98.2 F (36.8 C)  TempSrc: Oral   Oral  SpO2: 99% 96% 97% 94%  Weight:      Height:       Body mass index is 32.14 kg/m.  Physical Exam Vitals reviewed.  Constitutional:      Appearance: Normal appearance. He is not ill-appearing.  HENT:     Head: Normocephalic.     Mouth/Throat:     Mouth: Mucous membranes are moist.     Pharynx: Oropharynx is clear.  Eyes:     General: No scleral icterus. Cardiovascular:     Rate and Rhythm: Normal rate.  Pulmonary:     Effort: Pulmonary effort is normal.  Musculoskeletal:        General: Normal range of motion.     Cervical back: Normal range of motion.     Comments: Redness seems improved from marked area.   Skin:    Coloration: Skin is not jaundiced or pale.  Neurological:     Mental Status: He is alert and oriented to person, place, and time.  Psychiatric:        Mood and Affect: Mood normal.        Judgment: Judgment normal.      Lab Results Lab Results  Component Value Date   WBC 9.3 09/13/2024   HGB 8.4 (L) 09/13/2024   HCT 28.5 (L) 09/13/2024   MCV 83.6 09/13/2024   PLT 430 (H) 09/13/2024    Lab Results  Component Value Date   CREATININE 1.09 09/13/2024   BUN 22 (H) 09/13/2024   NA 135 09/13/2024   K 4.5 09/13/2024   CL 99 09/13/2024   CO2 30 09/13/2024    Lab Results  Component Value Date   ALT 23 09/06/2024   AST 22 09/06/2024   ALKPHOS 115 09/06/2024   BILITOT 0.5 09/06/2024     Microbiology: Recent Results (from the past 240 hours)  Culture, blood (Routine X 2) w Reflex to ID Panel     Status: None (Preliminary result)   Collection Time: 09/11/24  3:44 PM   Specimen: BLOOD RIGHT HAND  Result Value Ref Range Status   Specimen Description BLOOD RIGHT HAND  Final   Special Requests   Final    BOTTLES DRAWN AEROBIC AND ANAEROBIC Blood Culture  results may not be optimal due to an inadequate volume of blood received in culture bottles   Culture   Final    NO GROWTH 3 DAYS Performed at [facility], [address], [place], [place] [address]  Report Status PENDING  Incomplete  Culture, blood (Routine X 2) w Reflex to ID Panel     Status: None (Preliminary result)   Collection Time: 09/11/24  3:54 PM   Specimen: BLOOD RIGHT HAND  Result Value Ref Range Status   Specimen Description BLOOD RIGHT HAND  Final   Special Requests   Final    BOTTLES DRAWN AEROBIC AND ANAEROBIC Blood Culture results may not be optimal due to an inadequate volume of blood received in culture bottles   Culture   Final    NO GROWTH 3 DAYS Performed at [facility], [address], [place], [place] [address]    Report Status PENDING  Incomplete     Corean Fireman, MSN, NP-C [facility]  [email or web address] Pager: [phone] Office: [phone] [facility] Main Line: [phone] *Secure Chat Communication Welcome  Total Encounter Time: 25 min

## 2024-09-14 NOTE — Progress Notes (Signed)
 PROGRESS NOTE  Ryan [name]  DOB: 22-Nov-1965  PCP: [name] [name], NP FMW:[id number]  DOA: 08/16/2024  LOS: 28 days  Hospital Day: 30  Subjective: Patient was seen and examined this afternoon. Sitting up at the edge of the bed.  Not in distress Afebrile, hemodynamically stable, breathing room air Labs this morning with no worsening of creatinine after Lasix  yesterday WBC count was normal  Brief narrative: Ryan [name] is a 58 y.o. male with PMH significant for OSA, DM2, HTN, HLD, chronic venous insufficiencies, CHF, PAD, chronic smoking, COPD, GERD, diabetic neuropathy, s/p right BKA, chronic opiate use Patient follows up at [facility] for chronic left foot wound. 10/7, patient was sent to the ED from wound care center for evaluation of worsening left foot redness, pain and swelling with purulent drainage for last 2 months.  Left foot x-ray showed left fifth metatarsal joint changes consistent with osteomyelitis.   Admitted to [facility]  Orthopedics consulted  Empiric IV antibiotics was started  Blood culture sent on admission grew MRSA  Patient initially refused left BKA but he later agreed, underwent surgery on 10/22.  Assessment and plan: Left foot osteomyelitis Diabetic foot infection Left BKA stump cellulitis Patient initially refused left BKA but he later agreed, underwent left BKA on 10/22 by Dr. Harden On 10/26 he was found to have increased redness in his stump, ID and orthopedics reengaged, felt to be positional as he is keeping his stump downward for most part of the day.  He was monitored off antibiotics.   10/30, ultrasound duplex of the stump showed ruled out DVT but showed enlarged lymph nodes in the groin.  11/1, seen by Dr. Harden.  Incision site intact without any drainage but given concern of surrounding cellulitis, suggested IV antibiotics.  ID was reconsulted and IV daptomycin restarted on 11/2 On exam, cellulitis seems to be shrinking in extent. Per  ID recommendation, plan to switch to Zyvox for 10 days at discharge So far, no fever.  WBC count continues to remain normal. Recent Labs  Lab 09/08/24 0422 09/09/24 0327 09/12/24 0552 09/13/24 0509  WBC 8.1 7.1 9.8 9.3   MRSA bacteremia  Blood culture sent on admission grew MRSA  Last blood culture from 10/10 did not show any growth. TEE 10/15 negative for endocarditis Previously daptomycin course on 10/23.  Restarted again on 11/2.  See above.  Impaired mobility Bilateral BKA status  Prior right BKA 2024 and now new left BKA Seen by PT.  SNF recommended  Chronic opiate use disorder Diabetic neuropathy PTA meds- Celebrex 200 mg daily, gabapentin  800 mg 3 times daily, oxycodone  15 mg every 4 hours PRN Current pain regimen --- Scheduled: celecoxib 200 mg daily, gabapentin  800 mg 3 times daily --- PRN: Oxycodone  15 mg every 4 hours, IV Dilaudid  every 4 hours, Tylenol , Robaxin 500 mg every 6 hours   Type 2 diabetes mellitus uncontrolled A1c 13.2 on 08/16/2024 PTA meds-Semglee , Premeal insulin , Jardiance, metformin  Blood sugar level has been fluctuating wildly in the last few days. Currently better on Semglee  40 units twice daily, scheduled Premeal aspart 5 units 3 times daily, SSI/Accu-Cheks.   Diabetes care coordinator consult appreciated. Recent Labs  Lab 09/13/24 1134 09/13/24 1607 09/13/24 2120 09/14/24 0608 09/14/24 1113  GLUCAP 271* 194* 216* 149* 141*   Chronic diastolic CHF HTN Echo 10/10 LVEF 60 to 65%, appears euvolemic.   Currently on Toprol  12.5 mg daily, Bumex  1 mg daily, irbesartan 300 mg daily.   Patient was  concerned about worsening swelling and hence 1 dose of IV Lasix  40 mg was given yesterday. He states he could not tell the difference yet.  I have ordered another dose of IV Lasix  at a lower dose of 20 mg daily today.  PAD HLD Continue fenofibrate.  Not on antiplatelet or anticoagulant  Mild hyponatremia In the setting of diuretic use.  Continue to  monitor Recent Labs  Lab 09/08/24 0422 09/09/24 0327 09/12/24 0552 09/13/24 0509  NA 133* 134* 137 135    COPD  chronic daily smoker Respiratory status stable.   Counseled to quit smoking.  But patient prefers to go to SNF that allows him to smoke! Nicotine patch offered  GERD Protonix   OSA Nocturnal CPAP  Mobility:  PT Orders: Active   PT Follow up Rec: Skilled Nursing-Short Term Rehab (<3 Hours/Day)09/13/2024 1600   Goals of care   Code Status: Full Code     DVT prophylaxis:  SCD's Start: 08/31/24 1638 SCDs Start: 08/17/24 0224   Antimicrobials: Restarted on IV daptomycin on 11/2 Fluid: None Consultants: Orthopedics, ID Family Communication: None at bedside  Status: Inpatient Level of care:  Med-Surg   Patient is from: Home Needs to continue in-hospital care: ID has been reinvolved again. Anticipated d/c to: Pending clinical course, SNF recommended.  Patient however is just undecided      Diet:  Diet Order             Diet Carb Modified Fluid consistency: Thin; Room service appropriate? Yes  Diet effective now                   Scheduled Meds:  bumetanide   1 mg Oral Daily   celecoxib  200 mg Oral Daily   docusate sodium   100 mg Oral Daily   enoxaparin (LOVENOX) injection  60 mg Subcutaneous Q24H   fenofibrate  54 mg Oral Daily   furosemide   20 mg Intravenous Once   gabapentin   800 mg Oral TID   insulin  aspart  0-20 Units Subcutaneous TID WC   insulin  aspart  0-5 Units Subcutaneous QHS   insulin  aspart  5 Units Subcutaneous TID WC   insulin  glargine-yfgn  40 Units Subcutaneous BID   irbesartan  300 mg Oral Daily   metoprolol  succinate  12.5 mg Oral Daily   pantoprazole   40 mg Oral QHS    PRN meds: acetaminophen  **OR** acetaminophen , diphenhydrAMINE, diphenhydrAMINE-zinc  acetate, hydrALAZINE , HYDROmorphone  (DILAUDID ) injection, labetalol , melatonin, methocarbamol, metoprolol  tartrate, ondansetron  (ZOFRAN ) IV, oxyCODONE , phenol    Infusions:   DAPTOmycin 700 mg (09/13/24 1443)    Antimicrobials: Anti-infectives (From admission, onward)    Start     Dose/Rate Route Frequency Ordered Stop   09/15/24 0000  linezolid (ZYVOX) 600 MG tablet        600 mg Oral 2 times daily 09/14/24 1313 09/25/24 2359   09/11/24 1900  DAPTOmycin (CUBICIN) IVPB 700 mg/128mL premix        700 mg 200 mL/hr over 30 Minutes Intravenous Daily 09/11/24 1738     09/04/24 0900  DAPTOmycin (CUBICIN) IVPB 700 mg/136mL premix  Status:  Discontinued        700 mg 200 mL/hr over 30 Minutes Intravenous Daily 09/04/24 0808 09/04/24 1624   09/03/24 1000  doxycycline  (VIBRA -TABS) tablet 100 mg  Status:  Discontinued        100 mg Oral Every 12 hours 08/25/24 0935 09/04/24 0719   09/03/24 0000  doxycycline  (VIBRA -TABS) 100 MG tablet  Status:  Discontinued  100 mg Oral 2 times daily 08/25/24 0938 09/14/24    09/02/24 0800  DAPTOmycin (CUBICIN) IVPB 700 mg/147mL premix        8 mg/kg  91.5 kg (Adjusted) 200 mL/hr over 30 Minutes Intravenous Daily 09/01/24 1107 09/02/24 0920   08/31/24 1300  ceFAZolin  (ANCEF ) IVPB 2g/100 mL premix        2 g 200 mL/hr over 30 Minutes Intravenous On call to O.R. 08/31/24 1258 08/31/24 1543   08/31/24 1300  vancomycin  (VANCOREADY) IVPB 1500 mg/300 mL        1,500 mg 150 mL/hr over 120 Minutes Intravenous On call to O.R. 08/31/24 1258 08/31/24 1543   08/20/24 1500  levofloxacin (LEVAQUIN) tablet 750 mg  Status:  Discontinued        750 mg Oral Daily 08/20/24 1410 08/23/24 0916   08/20/24 1500  metroNIDAZOLE (FLAGYL) tablet 500 mg  Status:  Discontinued        500 mg Oral Every 12 hours 08/20/24 1410 08/23/24 0916   08/18/24 1600  DAPTOmycin (CUBICIN) IVPB 700 mg/166mL premix  Status:  Discontinued        6 mg/kg  108.9 kg 200 mL/hr over 30 Minutes Intravenous Daily 08/18/24 1519 08/18/24 1529   08/18/24 1600  DAPTOmycin (CUBICIN) IVPB 700 mg/125mL premix  Status:  Discontinued        8 mg/kg  91.5 kg  (Adjusted) 200 mL/hr over 30 Minutes Intravenous Daily 08/18/24 1529 09/01/24 1106   08/18/24 0930  vancomycin  (VANCOCIN ) IVPB 1000 mg/200 mL premix  Status:  Discontinued       Placed in Followed by Linked Group   1,000 mg 200 mL/hr over 60 Minutes Intravenous Every 12 hours 08/18/24 0916 08/18/24 1519   08/18/24 0715  vancomycin  (VANCOREADY) IVPB 1250 mg/250 mL  Status:  Discontinued       Placed in Followed by Linked Group   1,250 mg 166.7 mL/hr over 90 Minutes Intravenous Every 12 hours 08/17/24 1914 08/18/24 0916   08/17/24 1915  vancomycin  (VANCOREADY) IVPB 2000 mg/400 mL       Placed in Followed by Linked Group   2,000 mg 200 mL/hr over 120 Minutes Intravenous  Once 08/17/24 1914 08/17/24 2303   08/17/24 1000  linezolid (ZYVOX) IVPB 600 mg  Status:  Discontinued        600 mg 300 mL/hr over 60 Minutes Intravenous Every 12 hours 08/17/24 0228 08/17/24 1914   08/17/24 1000  piperacillin-tazobactam (ZOSYN) IVPB 3.375 g  Status:  Discontinued        3.375 g 12.5 mL/hr over 240 Minutes Intravenous Every 8 hours 08/17/24 0303 08/20/24 1409   08/17/24 0200  piperacillin-tazobactam (ZOSYN) IVPB 3.375 g        3.375 g 100 mL/hr over 30 Minutes Intravenous  Once 08/17/24 0158 08/17/24 0216   08/17/24 0200  linezolid (ZYVOX) IVPB 600 mg        600 mg 300 mL/hr over 60 Minutes Intravenous  Once 08/17/24 0158 08/17/24 0415       Objective: Vitals:   09/14/24 0359 09/14/24 0838  BP: 126/68 (!) 120/59  Pulse: 80 84  Resp:  12  Temp: 98.1 F (36.7 C) 98.2 F (36.8 C)  SpO2: 97% 94%    Intake/Output Summary (Last 24 hours) at 09/14/2024 1410 Last data filed at 09/14/2024 0600 Gross per 24 hour  Intake 420 ml  Output 1650 ml  Net -1230 ml    Filed Weights   09/07/24 1500 09/13/24 0500  Weight:  107.9 kg 110.5 kg   Weight change:  Body mass index is 32.14 kg/m.   Physical Exam: General exam: Pleasant, middle-aged Caucasian male.  Not in any pain at rest. Skin: No  rashes, lesions or ulcers. HEENT: Atraumatic, normocephalic, no obvious bleeding Lungs: Clear to auscultation bilaterally,  CVS: S1, S2, no murmur,   GI/Abd: Soft, nontender, nondistended, bowel sound present,   CNS: Alert, awake, oriented x 3 Psychiatry: Mood appropriate Extremities: Bilateral amputation status.  Improving redness, swelling on left thigh  Data Review: I have personally reviewed the laboratory data and studies available.  F/u labs ordered Unresulted Labs (From admission, onward)     Start     Ordered   09/05/24 0500  CK  Weekly,   R     Question:  Specimen collection method  Answer:  Lab=Lab collect   09/04/24 0834            Signed, Chapman Rota, MD [facility] 09/14/2024

## 2024-09-15 ENCOUNTER — Other Ambulatory Visit (HOSPITAL_COMMUNITY): Payer: Self-pay

## 2024-09-15 DIAGNOSIS — L03116 Cellulitis of left lower limb: Secondary | ICD-10-CM | POA: Diagnosis not present

## 2024-09-15 LAB — GLUCOSE, CAPILLARY
Glucose-Capillary: 158 mg/dL — ABNORMAL HIGH (ref 70–99)
Glucose-Capillary: 220 mg/dL — ABNORMAL HIGH (ref 70–99)
Glucose-Capillary: 191 mg/dL — ABNORMAL HIGH (ref 70–99)
Glucose-Capillary: 161 mg/dL — ABNORMAL HIGH (ref 70–99)

## 2024-09-15 MED ORDER — ACETAMINOPHEN 500 MG PO TABS
1000.0000 mg | ORAL_TABLET | Freq: Once | ORAL | Status: DC
  Filled 2024-09-15: qty 2

## 2024-09-15 NOTE — Progress Notes (Signed)
 Will go on when ready

## 2024-09-15 NOTE — Progress Notes (Signed)
 Patient ID: Ryan [name], male   DOB: 10/12/1966, 58 y.o.   MRN: [id number] Patient is status post left transtibial amputation.  Patient had acute drainage from the residual limb.  There is ischemic skin changes around the periphery.  Due to the persistent cellulitis acute drainage and necrotic skin changes we will plan for revision of the transtibial amputation on Friday tomorrow.

## 2024-09-15 NOTE — Progress Notes (Signed)
 PROGRESS NOTE  Ryan [name]  DOB: Apr 05, 1966  PCP: [name] [name], NP FMW:[id number]  DOA: 08/16/2024  LOS: 29 days  Hospital Day: 31  Subjective: Patient was seen and examined this morning. Afebrile, hemodynamically stable Blood sugar level mostly less than 200, fasting 158 this morning Followed up by Dr. Harden this morning for purulent discharge from the left BKA stump.  Noted a plan of revision amputation tomorrow. Time of my eval, sitting up at the edge of the bed.  Frustrated because of infection of the stump and the need of surgery again.  Brief narrative: Ryan [name] is a 58 y.o. male with PMH significant for OSA, DM2, HTN, HLD, chronic venous insufficiencies, CHF, PAD, chronic smoking, COPD, GERD, diabetic neuropathy, s/p right BKA, chronic opiate use Patient follows up at [facility] for chronic left foot wound. 10/7, patient was sent to the ED from wound care center for evaluation of worsening left foot redness, pain and swelling with purulent drainage for last 2 months.  Left foot x-ray showed left fifth metatarsal joint changes consistent with osteomyelitis.   Admitted to [facility]  Orthopedics consulted  Empiric IV antibiotics was started  Blood culture sent on admission grew MRSA  Patient initially refused left BKA but he later agreed, underwent surgery on 10/22.  Assessment and plan: Left foot osteomyelitis Diabetic foot infection Left BKA stump cellulitis Patient initially refused left BKA but he later agreed, underwent left BKA on 10/22 by Dr. Harden On 10/26 he was found to have increased redness in his stump, ID and orthopedics reengaged, felt to be positional as he is keeping his stump downward for most part of the day.  He was monitored off antibiotics.   10/30, ultrasound duplex of the stump showed ruled out DVT but showed enlarged lymph nodes in the groin.  11/1, seen by Dr. Harden.  Incision site intact without any drainage but given concern of  surrounding cellulitis, suggested IV antibiotics.  ID was reconsulted and IV daptomycin restarted on 11/2 On exam, cellulitis seems to be shrinking in extent.  However, on 11/5, on dressing change, patient was noted to have purulent discharge and ischemic changes at the incision site 11/6, seen by Dr. Harden.  Plans for revision of the transtibial amputation tomorrow. So far, no fever.  WBC count continues to remain normal. Recent Labs  Lab 09/09/24 0327 09/12/24 0552 09/13/24 0509  WBC 7.1 9.8 9.3   MRSA bacteremia  Blood culture sent on admission grew MRSA  Last blood culture from 10/10 did not show any growth. TEE 10/15 negative for endocarditis Previously daptomycin course on 10/23.  Restarted again on 11/2.  See above.  Impaired mobility Bilateral BKA status  Prior right BKA 2024 and now new left BKA Seen by PT.  SNF recommended  Chronic opiate use disorder Diabetic neuropathy PTA meds- Celebrex 200 mg daily, gabapentin  800 mg 3 times daily, oxycodone  15 mg every 4 hours PRN Current pain regimen --- Scheduled: celecoxib 200 mg daily, gabapentin  800 mg 3 times daily --- PRN: Oxycodone  15 mg every 4 hours, IV Dilaudid  every 4 hours, Tylenol , Robaxin 500 mg every 6 hours   Type 2 diabetes mellitus uncontrolled A1c 13.2 on 08/16/2024 PTA meds-Semglee , Premeal insulin , Jardiance, metformin  Blood sugar level has been fluctuating wildly in the last few days. Currently better on Semglee  40 units twice daily, scheduled Premeal aspart 5 units 3 times daily, SSI/Accu-Cheks.   Diabetes care coordinator consult appreciated. Recent Labs  Lab 09/14/24 1113  09/14/24 1627 09/14/24 2131 09/15/24 0626 09/15/24 1212  GLUCAP 141* 200* 193* 158* 220*   Chronic diastolic CHF HTN Echo 10/10 LVEF 60 to 65%, appears euvolemic.   Currently on Toprol  12.5 mg daily, Bumex  1 mg daily, irbesartan 300 mg daily.   Patient was concerned about worsening swelling and hence IV Lasix  given last 2 days.    PAD HLD Continue fenofibrate.  Not on antiplatelet or anticoagulant  Mild hyponatremia In the setting of diuretic use.  Continue to monitor Recent Labs  Lab 09/09/24 0327 09/12/24 0552 09/13/24 0509  NA 134* 137 135    COPD  chronic daily smoker Respiratory status stable.   Counseled to quit smoking.  But patient prefers to go to SNF that allows him to smoke! Nicotine patch offered  GERD Protonix   OSA Nocturnal CPAP  Mobility:  PT Orders: Active   PT Follow up Rec: Skilled Nursing-Short Term Rehab (<3 Hours/Day)09/13/2024 1600   Goals of care   Code Status: Full Code     DVT prophylaxis:  SCD's Start: 08/31/24 1638 SCDs Start: 08/17/24 0224   Antimicrobials: Restarted on IV daptomycin on 11/2 Fluid: None Consultants: Orthopedics, ID Family Communication: None at bedside  Status: Inpatient Level of care:  Med-Surg   Patient is from: Home Needs to continue in-hospital care: Plan for repeat left transtibial amputation tomorrow Anticipated d/c to: Pending clinical course      Diet:  Diet Order             Diet NPO time specified  Diet effective midnight           Diet Carb Modified Fluid consistency: Thin; Room service appropriate? Yes  Diet effective now                   Scheduled Meds:  bumetanide   1 mg Oral Daily   celecoxib  200 mg Oral Daily   docusate sodium   100 mg Oral Daily   enoxaparin (LOVENOX) injection  60 mg Subcutaneous Q24H   fenofibrate  54 mg Oral Daily   gabapentin   800 mg Oral TID   insulin  aspart  0-20 Units Subcutaneous TID WC   insulin  aspart  0-5 Units Subcutaneous QHS   insulin  aspart  5 Units Subcutaneous TID WC   insulin  glargine-yfgn  40 Units Subcutaneous BID   irbesartan  300 mg Oral Daily   metoprolol  succinate  12.5 mg Oral Daily   pantoprazole   40 mg Oral QHS    PRN meds: acetaminophen  **OR** acetaminophen , diphenhydrAMINE, hydrALAZINE , HYDROmorphone  (DILAUDID ) injection, labetalol , melatonin,  methocarbamol, metoprolol  tartrate, ondansetron  (ZOFRAN ) IV, oxyCODONE , phenol   Infusions:   DAPTOmycin 700 mg (09/15/24 1340)    Antimicrobials: Anti-infectives (From admission, onward)    Start     Dose/Rate Route Frequency Ordered Stop   09/15/24 0000  linezolid (ZYVOX) 600 MG tablet        600 mg Oral 2 times daily 09/14/24 1313 09/25/24 2359   09/11/24 1900  DAPTOmycin (CUBICIN) IVPB 700 mg/115mL premix        700 mg 200 mL/hr over 30 Minutes Intravenous Daily 09/11/24 1738     09/04/24 0900  DAPTOmycin (CUBICIN) IVPB 700 mg/139mL premix  Status:  Discontinued        700 mg 200 mL/hr over 30 Minutes Intravenous Daily 09/04/24 0808 09/04/24 1624   09/03/24 1000  doxycycline  (VIBRA -TABS) tablet 100 mg  Status:  Discontinued        100 mg Oral Every 12 hours 08/25/24  0935 09/04/24 0719   09/03/24 0000  doxycycline  (VIBRA -TABS) 100 MG tablet  Status:  Discontinued        100 mg Oral 2 times daily 08/25/24 0938 09/14/24    09/02/24 0800  DAPTOmycin (CUBICIN) IVPB 700 mg/150mL premix        8 mg/kg  91.5 kg (Adjusted) 200 mL/hr over 30 Minutes Intravenous Daily 09/01/24 1107 09/02/24 0920   08/31/24 1300  ceFAZolin  (ANCEF ) IVPB 2g/100 mL premix        2 g 200 mL/hr over 30 Minutes Intravenous On call to O.R. 08/31/24 1258 08/31/24 1543   08/31/24 1300  vancomycin  (VANCOREADY) IVPB 1500 mg/300 mL        1,500 mg 150 mL/hr over 120 Minutes Intravenous On call to O.R. 08/31/24 1258 08/31/24 1543   08/20/24 1500  levofloxacin (LEVAQUIN) tablet 750 mg  Status:  Discontinued        750 mg Oral Daily 08/20/24 1410 08/23/24 0916   08/20/24 1500  metroNIDAZOLE (FLAGYL) tablet 500 mg  Status:  Discontinued        500 mg Oral Every 12 hours 08/20/24 1410 08/23/24 0916   08/18/24 1600  DAPTOmycin (CUBICIN) IVPB 700 mg/18mL premix  Status:  Discontinued        6 mg/kg  108.9 kg 200 mL/hr over 30 Minutes Intravenous Daily 08/18/24 1519 08/18/24 1529   08/18/24 1600  DAPTOmycin (CUBICIN)  IVPB 700 mg/118mL premix  Status:  Discontinued        8 mg/kg  91.5 kg (Adjusted) 200 mL/hr over 30 Minutes Intravenous Daily 08/18/24 1529 09/01/24 1106   08/18/24 0930  vancomycin  (VANCOCIN ) IVPB 1000 mg/200 mL premix  Status:  Discontinued       Placed in Followed by Linked Group   1,000 mg 200 mL/hr over 60 Minutes Intravenous Every 12 hours 08/18/24 0916 08/18/24 1519   08/18/24 0715  vancomycin  (VANCOREADY) IVPB 1250 mg/250 mL  Status:  Discontinued       Placed in Followed by Linked Group   1,250 mg 166.7 mL/hr over 90 Minutes Intravenous Every 12 hours 08/17/24 1914 08/18/24 0916   08/17/24 1915  vancomycin  (VANCOREADY) IVPB 2000 mg/400 mL       Placed in Followed by Linked Group   2,000 mg 200 mL/hr over 120 Minutes Intravenous  Once 08/17/24 1914 08/17/24 2303   08/17/24 1000  linezolid (ZYVOX) IVPB 600 mg  Status:  Discontinued        600 mg 300 mL/hr over 60 Minutes Intravenous Every 12 hours 08/17/24 0228 08/17/24 1914   08/17/24 1000  piperacillin-tazobactam (ZOSYN) IVPB 3.375 g  Status:  Discontinued        3.375 g 12.5 mL/hr over 240 Minutes Intravenous Every 8 hours 08/17/24 0303 08/20/24 1409   08/17/24 0200  piperacillin-tazobactam (ZOSYN) IVPB 3.375 g        3.375 g 100 mL/hr over 30 Minutes Intravenous  Once 08/17/24 0158 08/17/24 0216   08/17/24 0200  linezolid (ZYVOX) IVPB 600 mg        600 mg 300 mL/hr over 60 Minutes Intravenous  Once 08/17/24 0158 08/17/24 0415       Objective: Vitals:   09/15/24 0545 09/15/24 1207  BP: 118/68 103/64  Pulse: 82 79  Resp: 18   Temp: 97.7 F (36.5 C) 98 F (36.7 C)  SpO2: 92% (!) 87%    Intake/Output Summary (Last 24 hours) at 09/15/2024 1411 Last data filed at 09/14/2024 1945 Gross per 24 hour  Intake 240 ml  Output 300 ml  Net -60 ml    Filed Weights   09/07/24 1500 09/13/24 0500  Weight: 107.9 kg 110.5 kg   Weight change:  Body mass index is 32.14 kg/m.   Physical Exam: General exam:  Pleasant, middle-aged Caucasian male.  Not in any pain at rest. Skin: No rashes, lesions or ulcers. HEENT: Atraumatic, normocephalic, no obvious bleeding Lungs: Clear to auscultation bilaterally,  CVS: S1, S2, no murmur,   GI/Abd: Soft, nontender, nondistended, bowel sound present,   CNS: Alert, awake, oriented x 3 Psychiatry: Mood appropriate Extremities: Bilateral amputation status.  Improving redness, swelling on left thigh  Data Review: I have personally reviewed the laboratory data and studies available.  F/u labs ordered Unresulted Labs (From admission, onward)     Start     Ordered   09/05/24 0500  CK  Weekly,   R     Question:  Specimen collection method  Answer:  Lab=Lab collect   09/04/24 0834            Signed, Chapman Rota, MD [facility] 09/15/2024

## 2024-09-15 NOTE — H&P (View-Only) (Signed)
 Patient ID: Ryan [name], male   DOB: 10/12/1966, 58 y.o.   MRN: [id number] Patient is status post left transtibial amputation.  Patient had acute drainage from the residual limb.  There is ischemic skin changes around the periphery.  Due to the persistent cellulitis acute drainage and necrotic skin changes we will plan for revision of the transtibial amputation on Friday tomorrow.

## 2024-09-16 ENCOUNTER — Encounter (HOSPITAL_COMMUNITY): Payer: Self-pay | Admitting: Internal Medicine

## 2024-09-16 ENCOUNTER — Inpatient Hospital Stay (HOSPITAL_COMMUNITY)

## 2024-09-16 ENCOUNTER — Inpatient Hospital Stay (HOSPITAL_COMMUNITY): Admitting: Critical Care Medicine

## 2024-09-16 ENCOUNTER — Inpatient Hospital Stay (HOSPITAL_COMMUNITY): Admitting: Anesthesiology

## 2024-09-16 ENCOUNTER — Encounter (HOSPITAL_COMMUNITY): Admission: EM | Disposition: E | Payer: Self-pay | Source: Home / Self Care | Attending: Internal Medicine

## 2024-09-16 ENCOUNTER — Other Ambulatory Visit (HOSPITAL_COMMUNITY): Payer: Self-pay

## 2024-09-16 DIAGNOSIS — I509 Heart failure, unspecified: Secondary | ICD-10-CM | POA: Diagnosis not present

## 2024-09-16 DIAGNOSIS — Z89512 Acquired absence of left leg below knee: Secondary | ICD-10-CM | POA: Diagnosis not present

## 2024-09-16 DIAGNOSIS — L03116 Cellulitis of left lower limb: Secondary | ICD-10-CM | POA: Diagnosis not present

## 2024-09-16 DIAGNOSIS — G4733 Obstructive sleep apnea (adult) (pediatric): Secondary | ICD-10-CM

## 2024-09-16 DIAGNOSIS — R7881 Bacteremia: Secondary | ICD-10-CM | POA: Diagnosis not present

## 2024-09-16 DIAGNOSIS — E11628 Type 2 diabetes mellitus with other skin complications: Secondary | ICD-10-CM | POA: Diagnosis not present

## 2024-09-16 DIAGNOSIS — E1169 Type 2 diabetes mellitus with other specified complication: Secondary | ICD-10-CM

## 2024-09-16 DIAGNOSIS — I5032 Chronic diastolic (congestive) heart failure: Secondary | ICD-10-CM | POA: Diagnosis not present

## 2024-09-16 DIAGNOSIS — I110 Hypertensive heart disease with heart failure: Secondary | ICD-10-CM | POA: Diagnosis not present

## 2024-09-16 DIAGNOSIS — I10 Essential (primary) hypertension: Secondary | ICD-10-CM

## 2024-09-16 DIAGNOSIS — M868X6 Other osteomyelitis, lower leg: Secondary | ICD-10-CM

## 2024-09-16 DIAGNOSIS — F17210 Nicotine dependence, cigarettes, uncomplicated: Secondary | ICD-10-CM

## 2024-09-16 DIAGNOSIS — T8781 Dehiscence of amputation stump: Secondary | ICD-10-CM

## 2024-09-16 HISTORY — PX: APPLICATION OF WOUND VAC: SHX5189

## 2024-09-16 HISTORY — PX: REVISION AMPUTATION, BELOW THE KNEE: SHX7423

## 2024-09-16 LAB — POCT I-STAT 7, (LYTES, BLD GAS, ICA,H+H)
Acid-base deficit: 15 mmol/L — ABNORMAL HIGH (ref 0.0–2.0)
Acid-base deficit: 9 mmol/L — ABNORMAL HIGH (ref 0.0–2.0)
Acid-base deficit: 11 mmol/L — ABNORMAL HIGH (ref 0.0–2.0)
Acid-base deficit: 9 mmol/L — ABNORMAL HIGH (ref 0.0–2.0)
Bicarbonate: 14.9 mmol/L — ABNORMAL LOW (ref 20.0–28.0)
Bicarbonate: 20 mmol/L (ref 20.0–28.0)
Bicarbonate: 20.5 mmol/L (ref 20.0–28.0)
Bicarbonate: 20.7 mmol/L (ref 20.0–28.0)
Calcium, Ion: 1.14 mmol/L — ABNORMAL LOW (ref 1.15–1.40)
Calcium, Ion: 1.08 mmol/L — ABNORMAL LOW (ref 1.15–1.40)
Calcium, Ion: 1.05 mmol/L — ABNORMAL LOW (ref 1.15–1.40)
Calcium, Ion: 1.06 mmol/L — ABNORMAL LOW (ref 1.15–1.40)
HCT: 23 % — ABNORMAL LOW (ref 39.0–52.0)
HCT: 26 % — ABNORMAL LOW (ref 39.0–52.0)
HCT: 28 % — ABNORMAL LOW (ref 39.0–52.0)
HCT: 31 % — ABNORMAL LOW (ref 39.0–52.0)
Hemoglobin: 7.8 g/dL — ABNORMAL LOW (ref 13.0–17.0)
Hemoglobin: 8.8 g/dL — ABNORMAL LOW (ref 13.0–17.0)
Hemoglobin: 10.5 g/dL — ABNORMAL LOW (ref 13.0–17.0)
Hemoglobin: 9.5 g/dL — ABNORMAL LOW (ref 13.0–17.0)
O2 Saturation: 94 %
O2 Saturation: 97 %
O2 Saturation: 92 %
O2 Saturation: 93 %
Patient temperature: 97.9
Patient temperature: 96.8
Patient temperature: 96.8
Potassium: 6.1 mmol/L — ABNORMAL HIGH (ref 3.5–5.1)
Potassium: 7 mmol/L (ref 3.5–5.1)
Potassium: 5.7 mmol/L — ABNORMAL HIGH (ref 3.5–5.1)
Potassium: 6.2 mmol/L — ABNORMAL HIGH (ref 3.5–5.1)
Sodium: 136 mmol/L (ref 135–145)
Sodium: 135 mmol/L (ref 135–145)
Sodium: 138 mmol/L (ref 135–145)
Sodium: 138 mmol/L (ref 135–145)
TCO2: 17 mmol/L — ABNORMAL LOW (ref 22–32)
TCO2: 22 mmol/L (ref 22–32)
TCO2: 23 mmol/L (ref 22–32)
TCO2: 23 mmol/L (ref 22–32)
pCO2 arterial: 54.7 mmHg — ABNORMAL HIGH (ref 32–48)
pCO2 arterial: 59.7 mmHg — ABNORMAL HIGH (ref 32–48)
pCO2 arterial: 64.4 mmHg — ABNORMAL HIGH (ref 32–48)
pCO2 arterial: 73.8 mmHg (ref 32–48)
pH, Arterial: 7.044 — CL (ref 7.35–7.45)
pH, Arterial: 7.131 — CL (ref 7.35–7.45)
pH, Arterial: 7.044 — CL (ref 7.35–7.45)
pH, Arterial: 7.109 — CL (ref 7.35–7.45)
pO2, Arterial: 102 mmHg (ref 83–108)
pO2, Arterial: 124 mmHg — ABNORMAL HIGH (ref 83–108)
pO2, Arterial: 89 mmHg (ref 83–108)
pO2, Arterial: 90 mmHg (ref 83–108)

## 2024-09-16 LAB — POCT I-STAT EG7
Acid-base deficit: 6 mmol/L — ABNORMAL HIGH (ref 0.0–2.0)
Bicarbonate: 23.1 mmol/L (ref 20.0–28.0)
Calcium, Ion: 1.09 mmol/L — ABNORMAL LOW (ref 1.15–1.40)
HCT: 25 % — ABNORMAL LOW (ref 39.0–52.0)
Hemoglobin: 8.5 g/dL — ABNORMAL LOW (ref 13.0–17.0)
O2 Saturation: 76 %
Patient temperature: 97.8
Potassium: 6.6 mmol/L (ref 3.5–5.1)
Sodium: 137 mmol/L (ref 135–145)
TCO2: 25 mmol/L (ref 22–32)
pCO2, Ven: 66.1 mmHg — ABNORMAL HIGH (ref 44–60)
pH, Ven: 7.149 — CL (ref 7.25–7.43)
pO2, Ven: 53 mmHg — ABNORMAL HIGH (ref 32–45)

## 2024-09-16 LAB — BASIC METABOLIC PANEL WITH GFR
Anion gap: 9 (ref 5–15)
Anion gap: 24 — ABNORMAL HIGH (ref 5–15)
BUN: 57 mg/dL — ABNORMAL HIGH (ref 6–20)
BUN: 59 mg/dL — ABNORMAL HIGH (ref 6–20)
CO2: 26 mmol/L (ref 22–32)
CO2: 20 mmol/L — ABNORMAL LOW (ref 22–32)
Calcium: 8.2 mg/dL — ABNORMAL LOW (ref 8.9–10.3)
Calcium: 8.2 mg/dL — ABNORMAL LOW (ref 8.9–10.3)
Chloride: 97 mmol/L — ABNORMAL LOW (ref 98–111)
Chloride: 97 mmol/L — ABNORMAL LOW (ref 98–111)
Creatinine, Ser: 1.75 mg/dL — ABNORMAL HIGH (ref 0.61–1.24)
Creatinine, Ser: 2.24 mg/dL — ABNORMAL HIGH (ref 0.61–1.24)
GFR, Estimated: 45 mL/min — ABNORMAL LOW (ref >60)
GFR, Estimated: 33 mL/min — ABNORMAL LOW (ref >60)
Glucose, Bld: 154 mg/dL — ABNORMAL HIGH (ref 70–99)
Glucose, Bld: 130 mg/dL — ABNORMAL HIGH (ref 70–99)
Potassium: 5 mmol/L (ref 3.5–5.1)
Potassium: 6.9 mmol/L (ref 3.5–5.1)
Sodium: 132 mmol/L — ABNORMAL LOW (ref 135–145)
Sodium: 141 mmol/L (ref 135–145)

## 2024-09-16 LAB — CBC WITH DIFFERENTIAL/PLATELET
Abs Immature Granulocytes: 0.04 K/uL (ref 0.00–0.07)
Basophils Absolute: 0.1 K/uL (ref 0.0–0.1)
Basophils Relative: 1 %
Eosinophils Absolute: 0.4 K/uL (ref 0.0–0.5)
Eosinophils Relative: 4 %
HCT: 28.9 % — ABNORMAL LOW (ref 39.0–52.0)
Hemoglobin: 8.4 g/dL — ABNORMAL LOW (ref 13.0–17.0)
Immature Granulocytes: 0 %
Lymphocytes Relative: 18 %
Lymphs Abs: 1.7 K/uL (ref 0.7–4.0)
MCH: 24.1 pg — ABNORMAL LOW (ref 26.0–34.0)
MCHC: 29.1 g/dL — ABNORMAL LOW (ref 30.0–36.0)
MCV: 82.8 fL (ref 80.0–100.0)
Monocytes Absolute: 0.9 K/uL (ref 0.1–1.0)
Monocytes Relative: 9 %
Neutro Abs: 6.4 K/uL (ref 1.7–7.7)
Neutrophils Relative %: 68 %
Platelets: 420 K/uL — ABNORMAL HIGH (ref 150–400)
RBC: 3.49 MIL/uL — ABNORMAL LOW (ref 4.22–5.81)
RDW: 19 % — ABNORMAL HIGH (ref 11.5–15.5)
WBC: 9.6 K/uL (ref 4.0–10.5)
nRBC: 0 % (ref 0.0–0.2)

## 2024-09-16 LAB — COMPREHENSIVE METABOLIC PANEL WITH GFR
ALT: 958 U/L — ABNORMAL HIGH (ref 0–44)
AST: 1584 U/L — ABNORMAL HIGH (ref 15–41)
Albumin: 1.8 g/dL — ABNORMAL LOW (ref 3.5–5.0)
Alkaline Phosphatase: 142 U/L — ABNORMAL HIGH (ref 38–126)
Anion gap: 27 — ABNORMAL HIGH (ref 5–15)
BUN: 60 mg/dL — ABNORMAL HIGH (ref 6–20)
CO2: 12 mmol/L — ABNORMAL LOW (ref 22–32)
Calcium: 8.5 mg/dL — ABNORMAL LOW (ref 8.9–10.3)
Chloride: 100 mmol/L (ref 98–111)
Creatinine, Ser: 2.17 mg/dL — ABNORMAL HIGH (ref 0.61–1.24)
GFR, Estimated: 34 mL/min — ABNORMAL LOW (ref >60)
Glucose, Bld: 82 mg/dL (ref 70–99)
Potassium: 6.3 mmol/L (ref 3.5–5.1)
Sodium: 139 mmol/L (ref 135–145)
Total Bilirubin: 0.6 mg/dL (ref 0.0–1.2)
Total Protein: 6.5 g/dL (ref 6.5–8.1)

## 2024-09-16 LAB — CULTURE, BLOOD (ROUTINE X 2)
Culture: NO GROWTH
Culture: NO GROWTH

## 2024-09-16 LAB — APTT: aPTT: 48 s — ABNORMAL HIGH (ref 24–36)

## 2024-09-16 LAB — PROTIME-INR
INR: 1.5 — ABNORMAL HIGH (ref 0.8–1.2)
Prothrombin Time: 18.5 s — ABNORMAL HIGH (ref 11.4–15.2)

## 2024-09-16 LAB — CBC
HCT: 27.2 % — ABNORMAL LOW (ref 39.0–52.0)
Hemoglobin: 7.4 g/dL — ABNORMAL LOW (ref 13.0–17.0)
MCH: 24.5 pg — ABNORMAL LOW (ref 26.0–34.0)
MCHC: 27.2 g/dL — ABNORMAL LOW (ref 30.0–36.0)
MCV: 90.1 fL (ref 80.0–100.0)
Platelets: 379 K/uL (ref 150–400)
RBC: 3.02 MIL/uL — ABNORMAL LOW (ref 4.22–5.81)
RDW: 19.5 % — ABNORMAL HIGH (ref 11.5–15.5)
WBC: 12.8 K/uL — ABNORMAL HIGH (ref 4.0–10.5)
nRBC: 2.5 % — ABNORMAL HIGH (ref 0.0–0.2)

## 2024-09-16 LAB — COOXEMETRY PANEL
Carboxyhemoglobin: 2.1 % — ABNORMAL HIGH (ref 0.5–1.5)
Methemoglobin: <0.7 % (ref 0.0–1.5)
O2 Saturation: 88 %
Total hemoglobin: 8 g/dL — ABNORMAL LOW (ref 12.0–16.0)

## 2024-09-16 LAB — LACTIC ACID, PLASMA
Lactic Acid, Venous: >9 mmol/L (ref 0.5–1.9)
Lactic Acid, Venous: >9 mmol/L (ref 0.5–1.9)

## 2024-09-16 LAB — CG4 I-STAT (LACTIC ACID): Lactic Acid, Venous: 11.5 mmol/L (ref 0.5–1.9)

## 2024-09-16 LAB — FIBRINOGEN: Fibrinogen: 629 mg/dL — ABNORMAL HIGH (ref 210–475)

## 2024-09-16 LAB — MAGNESIUM: Magnesium: 2.5 mg/dL — ABNORMAL HIGH (ref 1.7–2.4)

## 2024-09-16 LAB — PREPARE RBC (CROSSMATCH)

## 2024-09-16 LAB — GLUCOSE, CAPILLARY
Glucose-Capillary: 108 mg/dL — ABNORMAL HIGH (ref 70–99)
Glucose-Capillary: 114 mg/dL — ABNORMAL HIGH (ref 70–99)
Glucose-Capillary: 116 mg/dL — ABNORMAL HIGH (ref 70–99)
Glucose-Capillary: 125 mg/dL — ABNORMAL HIGH (ref 70–99)
Glucose-Capillary: 155 mg/dL — ABNORMAL HIGH (ref 70–99)
Glucose-Capillary: 75 mg/dL (ref 70–99)
Glucose-Capillary: 84 mg/dL (ref 70–99)

## 2024-09-16 LAB — TROPONIN I (HIGH SENSITIVITY)
Troponin I (High Sensitivity): 337 ng/L (ref <18)
Troponin I (High Sensitivity): 9183 ng/L (ref <18)

## 2024-09-16 SURGERY — REVISION AMPUTATION, BELOW THE KNEE
Anesthesia: General | Laterality: Left

## 2024-09-16 MED ORDER — FENTANYL 2500MCG IN NS 250ML (10MCG/ML) PREMIX INFUSION
0.0000 ug/h | INTRAVENOUS | Status: DC
  Filled 2024-09-16 (×2): qty 250

## 2024-09-16 MED ORDER — NOREPINEPHRINE 16 MG/250ML-% IV SOLN
0.0000 ug/min | INTRAVENOUS | Status: DC
  Administered 2024-09-16: 40 ug/min via INTRAVENOUS
  Administered 2024-09-17: 36 ug/min via INTRAVENOUS
  Administered 2024-09-17: 20 ug/min via INTRAVENOUS
  Administered 2024-09-17: 34 ug/min via INTRAVENOUS
  Administered 2024-09-18: 23 ug/min via INTRAVENOUS
  Filled 2024-09-16: qty 500
  Filled 2024-09-16 (×3): qty 250

## 2024-09-16 MED ORDER — POLYETHYLENE GLYCOL 3350 17 G PO PACK: 17.0000 g | PACK | Freq: Every day | ORAL | Status: DC | PRN

## 2024-09-16 MED ORDER — FENTANYL BOLUS VIA INFUSION: 25.0000 ug | INTRAVENOUS | Status: DC | PRN

## 2024-09-16 MED ORDER — ORAL CARE MOUTH RINSE: 15.0000 mL | Freq: Once | OROMUCOSAL | Status: DC

## 2024-09-16 MED ORDER — FAMOTIDINE 20 MG PO TABS: 20.0000 mg | ORAL_TABLET | Freq: Two times a day (BID) | ORAL | Status: DC

## 2024-09-16 MED ORDER — AMISULPRIDE (ANTIEMETIC) 5 MG/2ML IV SOLN
10.0000 mg | Freq: Once | INTRAVENOUS | Status: DC | PRN
Start: 2024-09-16 — End: 2024-09-16

## 2024-09-16 MED ORDER — NOREPINEPHRINE 4 MG/250ML-% IV SOLN
0.0000 ug/min | INTRAVENOUS | Status: DC
  Administered 2024-09-16: 15 ug/min via INTRAVENOUS
  Filled 2024-09-16: qty 750

## 2024-09-16 MED ORDER — REVEFENACIN 175 MCG/3ML IN SOLN
175.0000 ug | Freq: Every day | RESPIRATORY_TRACT | Status: DC
  Administered 2024-09-17 – 2024-09-18 (×2): 175 ug via RESPIRATORY_TRACT
  Filled 2024-09-16 (×2): qty 3

## 2024-09-16 MED ORDER — ONDANSETRON HCL 4 MG/2ML IJ SOLN
INTRAMUSCULAR | Status: AC
  Filled 2024-09-16: qty 2

## 2024-09-16 MED ORDER — INSULIN ASPART 100 UNIT/ML IJ SOLN
0.0000 [IU] | INTRAMUSCULAR | Status: DC
  Administered 2024-09-17 (×2): 3 [IU] via SUBCUTANEOUS
  Administered 2024-09-17: 2 [IU] via SUBCUTANEOUS
  Administered 2024-09-17 (×2): 3 [IU] via SUBCUTANEOUS
  Administered 2024-09-18 (×2): 5 [IU] via SUBCUTANEOUS
  Administered 2024-09-18: 3 [IU] via SUBCUTANEOUS
  Administered 2024-09-18: 5 [IU] via SUBCUTANEOUS
  Administered 2024-09-18: 3 [IU] via SUBCUTANEOUS
  Filled 2024-09-16 (×5): qty 3

## 2024-09-16 MED ORDER — VANCOMYCIN HCL 1000 MG IV SOLR
INTRAVENOUS | Status: AC
  Filled 2024-09-16: qty 20

## 2024-09-16 MED ORDER — CHLORHEXIDINE GLUCONATE 4 % EX SOLN: 60.0000 mL | Freq: Once | CUTANEOUS | Status: DC

## 2024-09-16 MED ORDER — FENTANYL CITRATE (PF) 50 MCG/ML IJ SOSY: 25.0000 ug | PREFILLED_SYRINGE | Freq: Once | INTRAMUSCULAR | Status: DC

## 2024-09-16 MED ORDER — INSULIN ASPART 100 UNIT/ML IJ SOLN
INTRAMUSCULAR | Status: AC
  Filled 2024-09-16: qty 10

## 2024-09-16 MED ORDER — DEXTROSE 50 % IV SOLN
1.0000 | Freq: Once | INTRAVENOUS | Status: AC
  Administered 2024-09-16: 50 mL via INTRAVENOUS

## 2024-09-16 MED ORDER — SODIUM BICARBONATE 8.4 % IV SOLN
INTRAVENOUS | Status: AC
  Filled 2024-09-16: qty 100

## 2024-09-16 MED ORDER — DEXTROSE 50 % IV SOLN
1.0000 | Freq: Once | INTRAVENOUS | Status: AC
  Administered 2024-09-16: 50 mL via INTRAVENOUS
  Filled 2024-09-16: qty 50

## 2024-09-16 MED ORDER — EPINEPHRINE HCL 5 MG/250ML IV SOLN IN NS
0.5000 ug/min | INTRAVENOUS | Status: DC
  Administered 2024-09-16: 5 ug/min via INTRAVENOUS
  Administered 2024-09-16: 8 ug/min via INTRAVENOUS
  Administered 2024-09-17: 13 ug/min via INTRAVENOUS
  Administered 2024-09-17: 11 ug/min via INTRAVENOUS
  Filled 2024-09-16 (×2): qty 250

## 2024-09-16 MED ORDER — SODIUM CHLORIDE 0.9% IV SOLUTION: Freq: Once | INTRAVENOUS | Status: AC

## 2024-09-16 MED ORDER — LIDOCAINE 2% (20 MG/ML) 5 ML SYRINGE
INTRAMUSCULAR | Status: DC | PRN
  Administered 2024-09-16: 100 mg via INTRAVENOUS

## 2024-09-16 MED ORDER — DOCUSATE SODIUM 50 MG/5ML PO LIQD: 100.0000 mg | Freq: Two times a day (BID) | ORAL | Status: DC | PRN

## 2024-09-16 MED ORDER — ONDANSETRON HCL 4 MG/2ML IJ SOLN: 4.0000 mg | Freq: Once | INTRAMUSCULAR | Status: DC | PRN

## 2024-09-16 MED ORDER — CHLORHEXIDINE GLUCONATE 0.12 % MT SOLN: 15.0000 mL | Freq: Once | OROMUCOSAL | Status: DC

## 2024-09-16 MED ORDER — CALCIUM GLUCONATE-NACL 2-0.675 GM/100ML-% IV SOLN
2.0000 g | Freq: Once | INTRAVENOUS | Status: AC
  Administered 2024-09-16: 2000 mg via INTRAVENOUS
  Filled 2024-09-16: qty 100

## 2024-09-16 MED ORDER — HYDROCORTISONE SOD SUC (PF) 100 MG IJ SOLR
100.0000 mg | Freq: Two times a day (BID) | INTRAMUSCULAR | Status: DC
Start: 2024-09-16 — End: 2024-09-19
  Administered 2024-09-16 – 2024-09-18 (×4): 100 mg via INTRAVENOUS
  Filled 2024-09-16 (×4): qty 2

## 2024-09-16 MED ORDER — PANTOPRAZOLE SODIUM 40 MG IV SOLR
40.0000 mg | Freq: Two times a day (BID) | INTRAVENOUS | Status: DC
  Administered 2024-09-16 – 2024-09-18 (×4): 40 mg via INTRAVENOUS
  Filled 2024-09-16 (×4): qty 10

## 2024-09-16 MED ORDER — CHLORHEXIDINE GLUCONATE CLOTH 2 % EX PADS
6.0000 | MEDICATED_PAD | Freq: Every day | CUTANEOUS | Status: DC
  Administered 2024-09-16 – 2024-09-17 (×2): 6 via TOPICAL

## 2024-09-16 MED ORDER — HYDROMORPHONE HCL 1 MG/ML IJ SOLN
0.2500 mg | INTRAMUSCULAR | Status: DC | PRN
  Administered 2024-09-16 (×2): 0.25 mg via INTRAVENOUS
  Administered 2024-09-16: 0.5 mg via INTRAVENOUS

## 2024-09-16 MED ORDER — HYDROMORPHONE HCL 1 MG/ML IJ SOLN
0.5000 mg | Freq: Once | INTRAMUSCULAR | Status: AC
  Administered 2024-09-16: 0.5 mg via INTRAVENOUS

## 2024-09-16 MED ORDER — ALBUTEROL SULFATE (2.5 MG/3ML) 0.083% IN NEBU
5.0000 mg | INHALATION_SOLUTION | Freq: Once | RESPIRATORY_TRACT | Status: AC
  Administered 2024-09-16: 5 mg via RESPIRATORY_TRACT
  Filled 2024-09-16: qty 6

## 2024-09-16 MED ORDER — LACTATED RINGERS IV SOLN: INTRAVENOUS | Status: DC

## 2024-09-16 MED ORDER — METHOCARBAMOL 500 MG PO TABS
ORAL_TABLET | ORAL | Status: AC
Start: 2024-09-16 — End: 2024-09-16
  Filled 2024-09-16: qty 1

## 2024-09-16 MED ORDER — METOPROLOL TARTRATE 5 MG/5ML IV SOLN: 2.0000 mg | INTRAVENOUS | Status: DC | PRN

## 2024-09-16 MED ORDER — DEXTROSE 50 % IV SOLN
INTRAVENOUS | Status: AC
  Filled 2024-09-16: qty 50

## 2024-09-16 MED ORDER — PHENYLEPHRINE 80 MCG/ML (10ML) SYRINGE FOR IV PUSH (FOR BLOOD PRESSURE SUPPORT)
PREFILLED_SYRINGE | INTRAVENOUS | Status: DC | PRN
  Administered 2024-09-16: 200 ug via INTRAVENOUS
  Administered 2024-09-16 (×2): 160 ug via INTRAVENOUS

## 2024-09-16 MED ORDER — VANCOMYCIN HCL 1000 MG IV SOLR
INTRAVENOUS | Status: DC | PRN
  Administered 2024-09-16: 1000 mg

## 2024-09-16 MED ORDER — VASOPRESSIN 20 UNIT/ML IV SOLN
INTRAVENOUS | Status: DC | PRN
  Administered 2024-09-16: 2 [IU] via INTRAVENOUS
  Administered 2024-09-16 (×2): 1 [IU] via INTRAVENOUS

## 2024-09-16 MED ORDER — LACTATED RINGERS IV BOLUS
1000.0000 mL | Freq: Once | INTRAVENOUS | Status: AC
  Administered 2024-09-16: 1000 mL via INTRAVENOUS

## 2024-09-16 MED ORDER — POVIDONE-IODINE 10 % EX SWAB
2.0000 | Freq: Once | CUTANEOUS | Status: AC
  Administered 2024-09-16: 2 via TOPICAL

## 2024-09-16 MED ORDER — VASHE WOUND IRRIGATION OPTIME
TOPICAL | Status: DC | PRN
  Administered 2024-09-16: 34 [oz_av]

## 2024-09-16 MED ORDER — MIDAZOLAM HCL 2 MG/2ML IJ SOLN
INTRAMUSCULAR | Status: AC
Start: 2024-09-16 — End: 2024-09-16
  Filled 2024-09-16: qty 2

## 2024-09-16 MED ORDER — SODIUM CHLORIDE 0.9 % IV SOLN: INTRAVENOUS | Status: AC

## 2024-09-16 MED ORDER — VASOPRESSIN 20 UNITS/100 ML INFUSION FOR SHOCK
0.0400 [IU]/min | INTRAVENOUS | Status: DC
  Administered 2024-09-16 – 2024-09-18 (×7): 0.04 [IU]/min via INTRAVENOUS
  Filled 2024-09-16 (×6): qty 100

## 2024-09-16 MED ORDER — LIDOCAINE 2% (20 MG/ML) 5 ML SYRINGE
INTRAMUSCULAR | Status: AC
Start: 2024-09-16 — End: 2024-09-16
  Filled 2024-09-16: qty 5

## 2024-09-16 MED ORDER — EPINEPHRINE HCL 5 MG/250ML IV SOLN IN NS
INTRAVENOUS | Status: AC
  Filled 2024-09-16: qty 250

## 2024-09-16 MED ORDER — NALOXONE HCL 0.4 MG/ML IJ SOLN
INTRAMUSCULAR | Status: DC | PRN
  Administered 2024-09-16: 80 ug via INTRAVENOUS

## 2024-09-16 MED ORDER — HYDROMORPHONE HCL 1 MG/ML IJ SOLN
INTRAMUSCULAR | Status: AC
  Filled 2024-09-16: qty 1

## 2024-09-16 MED ORDER — PIPERACILLIN-TAZOBACTAM 3.375 G IVPB
3.3750 g | Freq: Three times a day (TID) | INTRAVENOUS | Status: DC
  Administered 2024-09-16 – 2024-09-18 (×6): 3.375 g via INTRAVENOUS
  Filled 2024-09-16 (×6): qty 50

## 2024-09-16 MED ORDER — INSULIN ASPART 100 UNIT/ML IV SOLN
10.0000 [IU] | Freq: Once | INTRAVENOUS | Status: AC
  Administered 2024-09-16: 10 [IU] via INTRAVENOUS
  Filled 2024-09-16: qty 10

## 2024-09-16 MED ORDER — MIDAZOLAM HCL (PF) 2 MG/2ML IJ SOLN
INTRAMUSCULAR | Status: DC | PRN
  Administered 2024-09-16: 1 mg via INTRAVENOUS

## 2024-09-16 MED ORDER — ORAL CARE MOUTH RINSE
15.0000 mL | OROMUCOSAL | Status: DC
  Administered 2024-09-16 – 2024-09-18 (×23): 15 mL via OROMUCOSAL

## 2024-09-16 MED ORDER — ENOXAPARIN SODIUM 60 MG/0.6ML IJ SOSY: 60.0000 mg | PREFILLED_SYRINGE | INTRAMUSCULAR | Status: DC

## 2024-09-16 MED ORDER — ARFORMOTEROL TARTRATE 15 MCG/2ML IN NEBU
15.0000 ug | INHALATION_SOLUTION | Freq: Two times a day (BID) | RESPIRATORY_TRACT | Status: DC
  Administered 2024-09-16 – 2024-09-18 (×4): 15 ug via RESPIRATORY_TRACT
  Filled 2024-09-16 (×4): qty 2

## 2024-09-16 MED ORDER — SUCCINYLCHOLINE CHLORIDE 200 MG/10ML IV SOSY
PREFILLED_SYRINGE | INTRAVENOUS | Status: DC | PRN
  Administered 2024-09-16: 120 mg via INTRAVENOUS

## 2024-09-16 MED ORDER — VANCOMYCIN HCL IN DEXTROSE 1-5 GM/200ML-% IV SOLN
1000.0000 mg | INTRAVENOUS | Status: DC
  Administered 2024-09-16 – 2024-09-17 (×2): 1000 mg via INTRAVENOUS
  Filled 2024-09-16 (×2): qty 200

## 2024-09-16 MED ORDER — FENTANYL CITRATE (PF) 250 MCG/5ML IJ SOLN
INTRAMUSCULAR | Status: DC | PRN
Start: 2024-09-16 — End: 2024-09-16
  Administered 2024-09-16: 50 ug via INTRAVENOUS

## 2024-09-16 MED ORDER — SODIUM BICARBONATE 8.4 % IV SOLN
100.0000 meq | Freq: Once | INTRAVENOUS | Status: AC
  Administered 2024-09-16: 100 meq via INTRAVENOUS

## 2024-09-16 MED ORDER — ORAL CARE MOUTH RINSE: 15.0000 mL | OROMUCOSAL | Status: DC | PRN

## 2024-09-16 MED ORDER — VASOPRESSIN 20 UNIT/ML IV SOLN
INTRAVENOUS | Status: AC
Start: 2024-09-16 — End: 2024-09-16
  Filled 2024-09-16: qty 1

## 2024-09-16 MED ORDER — SODIUM BICARBONATE 8.4 % IV SOLN
INTRAVENOUS | Status: AC
  Administered 2024-09-16: 50 meq
  Filled 2024-09-16: qty 50

## 2024-09-16 MED ORDER — PROPOFOL 10 MG/ML IV BOLUS
INTRAVENOUS | Status: AC
  Filled 2024-09-16: qty 20

## 2024-09-16 MED ORDER — PHENYLEPHRINE HCL-NACL 20-0.9 MG/250ML-% IV SOLN: 0.0000 ug/min | INTRAVENOUS | Status: DC

## 2024-09-16 MED ORDER — POTASSIUM CHLORIDE CRYS ER 20 MEQ PO TBCR: 40.0000 meq | EXTENDED_RELEASE_TABLET | Freq: Every day | ORAL | Status: DC | PRN

## 2024-09-16 MED ORDER — SODIUM ZIRCONIUM CYCLOSILICATE 10 G PO PACK
10.0000 g | PACK | Freq: Three times a day (TID) | ORAL | Status: DC
  Administered 2024-09-16 – 2024-09-17 (×2): 10 g
  Filled 2024-09-16 (×2): qty 1

## 2024-09-16 MED ORDER — FENTANYL CITRATE (PF) 100 MCG/2ML IJ SOLN
INTRAMUSCULAR | Status: AC
  Filled 2024-09-16: qty 2

## 2024-09-16 MED ORDER — PROPOFOL 10 MG/ML IV BOLUS
INTRAVENOUS | Status: DC | PRN
  Administered 2024-09-16: 150 mg via INTRAVENOUS

## 2024-09-16 MED ORDER — NALOXONE HCL 0.4 MG/ML IJ SOLN
INTRAMUSCULAR | Status: AC
  Filled 2024-09-16: qty 1

## 2024-09-16 SURGICAL SUPPLY — 31 items
BAG COUNTER SPONGE SURGICOUNT (BAG) IMPLANT
BLADE SAW RECIP 87.9 MT (BLADE) ×1 IMPLANT
BLADE SURG 21 STRL SS (BLADE) ×1 IMPLANT
BNDG COHESIVE 6X5 TAN ST LF (GAUZE/BANDAGES/DRESSINGS) IMPLANT
CANISTER WOUND CARE 500ML ATS (WOUND CARE) ×1 IMPLANT
COVER SURGICAL LIGHT HANDLE (MISCELLANEOUS) ×1 IMPLANT
CUFF TRNQT CYL 34X4.125X (TOURNIQUET CUFF) ×1 IMPLANT
DRAPE INCISE IOBAN 66X45 STRL (DRAPES) ×1 IMPLANT
DRAPE U-SHAPE 47X51 STRL (DRAPES) ×1 IMPLANT
DRSG VAC PEEL AND PLACE LRG (GAUZE/BANDAGES/DRESSINGS) ×1 IMPLANT
DURAPREP 26ML APPLICATOR (WOUND CARE) ×1 IMPLANT
ELECTRODE REM PT RTRN 9FT ADLT (ELECTROSURGICAL) ×1 IMPLANT
GLOVE BIOGEL PI IND STRL 9 (GLOVE) ×1 IMPLANT
GLOVE SURG ORTHO 9.0 STRL STRW (GLOVE) ×1 IMPLANT
GOWN STRL REUS W/ TWL XL LVL3 (GOWN DISPOSABLE) ×2 IMPLANT
GRAFT SKIN WND MICRO 38 (Tissue) IMPLANT
KIT BASIN OR (CUSTOM PROCEDURE TRAY) ×1 IMPLANT
KIT TURNOVER KIT B (KITS) ×1 IMPLANT
MANIFOLD NEPTUNE II (INSTRUMENTS) ×1 IMPLANT
PACK ORTHO EXTREMITY (CUSTOM PROCEDURE TRAY) ×1 IMPLANT
PAD ARMBOARD POSITIONER FOAM (MISCELLANEOUS) ×1 IMPLANT
SOLN 0.9% NACL POUR BTL 1000ML (IV SOLUTION) ×1 IMPLANT
SPONGE T-LAP 18X18 ~~LOC~~+RFID (SPONGE) IMPLANT
STAPLER SKIN PROX 35W (STAPLE) IMPLANT
STOCKINETTE IMPERVIOUS LG (DRAPES) ×1 IMPLANT
SUT ETHILON 2 0 PSLX (SUTURE) IMPLANT
SUT SILK 2-0 18XBRD TIE 12 (SUTURE) ×1 IMPLANT
SUT VIC AB 1 CTX 27 (SUTURE) ×2 IMPLANT
TOWEL GREEN STERILE (TOWEL DISPOSABLE) ×1 IMPLANT
TUBE CONNECTING 12X1/4 (SUCTIONS) ×1 IMPLANT
YANKAUER SUCT BULB TIP NO VENT (SUCTIONS) ×1 IMPLANT

## 2024-09-16 NOTE — Progress Notes (Addendum)
 eLink Physician-Brief Progress Note Patient Name: Ryan [name] DOB: 1966-08-09 MRN: [id number]   Date of Service  09/16/2024  HPI/Events of Note  Received request for rectal tube given liquid stools.  Pt also oliguric. Pt is getting 1 unit pRBC now.   eICU Interventions  Rectal tube ordered.  Continue to monitor urine output.       Intervention Category Minor Interventions: Other:  Shanda Busman 09/16/2024, 11:50 PM  3:38 AM Pt remains oliguric ever after pRBC transfused.  He remains on 3 pressors.   Plan> Stat ABG.  Recheck lactic acid now.  Start on HCO3 gtt now.   4:29 AM ABG 7.066/61.1/88  Plan> Continue HCO3 gtt.  Increase minute ventilation - TV 530, rate 24, PEEP 8, 100% FiO2.  5:30 AM Lactic acid >9 from 11.5.   Plan> Continue HCO3 gtt.

## 2024-09-16 NOTE — Anesthesia Procedure Notes (Addendum)
 Procedure Name: Intubation Date/Time: 09/16/2024 10:54 AM  Performed by: Delores Duwaine SAUNDERS, CRNAPre-anesthesia Checklist: Patient identified, Emergency Drugs available, Suction available and Patient being monitored Patient Re-evaluated:Patient Re-evaluated prior to induction Oxygen Delivery Method: Circle System Utilized Preoxygenation: Pre-oxygenation with 100% oxygen Induction Type: IV induction Ventilation: Mask ventilation without difficulty Laryngoscope Size: Mac and 4 Grade View: Grade II Tube type: Oral Tube size: 7.5 mm Number of attempts: 1 Airway Equipment and Method: Stylet and Oral airway Placement Confirmation: ETT inserted through vocal cords under direct vision, positive ETCO2 and breath sounds checked- equal and bilateral Secured at: 23 cm Tube secured with: Tape Dental Injury: Teeth and Oropharynx as per pre-operative assessment

## 2024-09-16 NOTE — Op Note (Signed)
 09/16/2024  11:40 AM  PATIENT:  Ryan [name]    PRE-OPERATIVE DIAGNOSIS:  Abscess Left Below Knee Amptuation  POST-OPERATIVE DIAGNOSIS:  Same  PROCEDURE:  REVISION LEFT TRANSTIBIAL AMPUTATION, WOUND VAC APPLICATION Application Kerecis micro graft 38 cm and 1 g vancomycin  powder.  SURGEON:  Jerona LULLA Sage, MD  PHYSICIAN ASSISTANT:None ANESTHESIA:   General  PREOPERATIVE INDICATIONS:  [name] is a  58 y.o. male with a diagnosis of Abscess Left Below Knee Amptuation who failed conservative measures and elected for surgical management.    The risks benefits and alternatives were discussed with the patient preoperatively including but not limited to the risks of infection, bleeding, nerve injury, cardiopulmonary complications, the need for revision surgery, among others, and the patient was willing to proceed.  OPERATIVE IMPLANTS:   Implant Name Type Inv. Item Serial No. Manufacturer Lot No. LRB No. Used Action  GRAFT SKIN WND MICRO 38 - [id number] Tissue GRAFT SKIN WND MICRO 38  [employer] [id number] Left 1 Implanted    @ENCIMAGES @  OPERATIVE FINDINGS: Patient had a large hematoma this was sent for cultures.  Vancomycin  powder mixed with the Kerecis micro graft.  OPERATIVE PROCEDURE:  Patient was brought the operating room and underwent general anesthetic.  After adequate levels anesthesia obtained patient's left lower extremity was prepped using DuraPrep draped into a sterile field a timeout was called.  A fishmouth incision was made around the area of wound dehiscence.  This was carried down to bone.  There was a large area of hematoma and this was sent for cultures.  The distal centimeter of the tibia and fibula were resected.  Tissue margins were clear.  Electrocautery was used hemostasis.  The wound was irrigated with Vashe.  The wound bed was reinforced with Kerecis micro graft 38 cm and 1 g vancomycin  powder.  The fascial layers and skin were closed using 2-0  Vicryl the wound was covered with a peel in place wound VAC sponge this had a good suction fit patient was extubated taken the PACU in stable condition.   DISCHARGE PLANNING:  Antibiotic duration: Continue antibiotics based on tissue cultures  Weightbearing: Transfer training  Pain medication: Opioid pathway  Dressing care/ Wound VAC: Wound VAC left below-knee amputation  Ambulatory devices: Not applicable  Discharge to: Anticipate discharge to skilled nursing.  Follow-up: In the office 1 week post operative.

## 2024-09-16 NOTE — Progress Notes (Addendum)
 PROGRESS NOTE  Ryan [name]  DOB: 12-24-1965  PCP: [name] [name], NP FMW:[id number]  DOA: 08/16/2024  LOS: 30 days  Hospital Day: 32  Subjective: Scheduled for Surgery today No acute overnight events.  Brief narrative: Ryan [name] is a 58 y.o. male with PMH significant for OSA, DM2, HTN, HLD, chronic venous insufficiencies, CHF, PAD, chronic smoking, COPD, GERD, diabetic neuropathy, s/p right BKA, chronic opiate use Patient follows up at [facility] for chronic left foot wound. 10/7, patient was sent to the ED from wound care center for evaluation of worsening left foot redness, pain and swelling with purulent drainage for last 2 months.  Left foot x-ray showed left fifth metatarsal joint changes consistent with osteomyelitis.   Admitted to [facility]  Orthopedics consulted  Empiric IV antibiotics was started  Blood culture sent on admission grew MRSA  Patient initially refused left BKA but he later agreed, underwent surgery on 10/22.  Assessment and plan: Left foot osteomyelitis Diabetic foot infection Left BKA stump cellulitis Patient initially refused left BKA but he later agreed, underwent left BKA on 10/22 by Dr. Harden On 10/26 he was found to have increased redness in his stump, ID and orthopedics reengaged, felt to be positional as he is keeping his stump downward for most part of the day.  He was monitored off antibiotics.   10/30, ultrasound duplex of the stump showed ruled out DVT but showed enlarged lymph nodes in the groin.  11/1, seen by Dr. Harden.  Incision site intact without any drainage but given concern of surrounding cellulitis, suggested IV antibiotics.  ID was reconsulted and IV daptomycin restarted on 11/2 On exam, cellulitis seems to be shrinking in extent.  However, on 11/5, on dressing change, patient was noted to have purulent discharge and ischemic changes at the incision site 11/6, seen by Dr. Harden.  Plans for revision of the transtibial  amputation tomorrow. So far, no fever.  WBC count continues to remain normal. Recent Labs  Lab 09/12/24 0552 09/13/24 0509 09/16/24 0504  WBC 9.8 9.3 9.6   MRSA bacteremia  Blood culture sent on admission grew MRSA  Last blood culture from 10/10 did not show any growth. TEE 10/15 negative for endocarditis Previously daptomycin course on 10/23.  Restarted again on 11/2.  See above.  Impaired mobility Bilateral BKA status  Prior right BKA 2024 and now new left BKA Seen by PT.  SNF recommended  Chronic opiate use disorder Diabetic neuropathy PTA meds- Celebrex 200 mg daily, gabapentin  800 mg 3 times daily, oxycodone  15 mg every 4 hours PRN Current pain regimen --- Scheduled: celecoxib 200 mg daily, gabapentin  800 mg 3 times daily --- PRN: Oxycodone  15 mg every 4 hours, IV Dilaudid  every 4 hours, Tylenol , Robaxin 500 mg every 6 hours   Type 2 diabetes mellitus uncontrolled A1c 13.2 on 08/16/2024 PTA meds-Semglee , Premeal insulin , Jardiance, metformin  Blood sugar level has been fluctuating wildly in the last few days. Currently better on Semglee  40 units twice daily, scheduled Premeal aspart 5 units 3 times daily, SSI/Accu-Cheks.   Diabetes care coordinator consult appreciated. Recent Labs  Lab 09/15/24 1650 09/15/24 2104 09/16/24 0616 09/16/24 0854 09/16/24 1215  GLUCAP 191* 161* 125* 108* 116*   Chronic diastolic CHF HTN Echo 10/10 LVEF 60 to 65%, appears euvolemic.   Currently on Toprol  12.5 mg daily, Bumex  1 mg daily, irbesartan 300 mg daily.   Patient was concerned about worsening swelling and hence IV Lasix  given last 2 days.  PAD HLD Continue fenofibrate.  Not on antiplatelet or anticoagulant  Mild hyponatremia In the setting of diuretic use.  Continue to monitor Recent Labs  Lab 09/12/24 0552 09/13/24 0509 09/16/24 0504  NA 137 135 132*    COPD  chronic daily smoker Respiratory status stable.   Counseled to quit smoking.  But patient prefers to go to  SNF that allows him to smoke! Nicotine patch offered  GERD Protonix   OSA Nocturnal CPAP  Mobility:  PT Orders: Active   PT Follow up Rec: Skilled Nursing-Short Term Rehab (<3 Hours/Day)09/13/2024 1600   Goals of care   Code Status: Full Code     DVT prophylaxis:  SCD's Start: 08/31/24 1638 SCDs Start: 08/17/24 0224   Antimicrobials: Restarted on IV daptomycin on 11/2 Fluid: None Consultants: Orthopedics, ID Family Communication: None at bedside  Status: Inpatient Level of care:  Med-Surg   Patient is from: Home Needs to continue in-hospital care: Plan for repeat left transtibial amputation tomorrow Anticipated d/c to: Pending clinical course      Diet:  Diet Order             Diet NPO time specified  Diet effective now                   Scheduled Meds:  acetaminophen   1,000 mg Oral Once   chlorhexidine   60 mL Topical Once   chlorhexidine   15 mL Mouth/Throat Once   Or   mouth rinse  15 mL Mouth Rinse Once   [MAR Hold] docusate sodium   100 mg Oral Daily   [MAR Hold] enoxaparin (LOVENOX) injection  60 mg Subcutaneous Q24H   [MAR Hold] fenofibrate  54 mg Oral Daily   [MAR Hold] gabapentin   800 mg Oral TID   [MAR Hold] insulin  aspart  0-20 Units Subcutaneous TID WC   [MAR Hold] insulin  aspart  0-5 Units Subcutaneous QHS   [MAR Hold] insulin  aspart  5 Units Subcutaneous TID WC   [MAR Hold] insulin  glargine-yfgn  40 Units Subcutaneous BID   [MAR Hold] metoprolol  succinate  12.5 mg Oral Daily   [MAR Hold] pantoprazole   40 mg Oral QHS    PRN meds: [MAR Hold] acetaminophen  **OR** [MAR Hold] acetaminophen , amisulpride , [MAR Hold] diphenhydrAMINE, [MAR Hold] hydrALAZINE , HYDROmorphone  (DILAUDID ) injection, [MAR Hold]  HYDROmorphone  (DILAUDID ) injection, [MAR Hold] labetalol , [MAR Hold] melatonin, [MAR Hold] methocarbamol, [MAR Hold] metoprolol  tartrate, [MAR Hold] ondansetron  (ZOFRAN ) IV, ondansetron  (ZOFRAN ) IV, [MAR Hold] oxyCODONE , [MAR Hold] phenol    Infusions:   [MAR Hold] DAPTOmycin 200 mL/hr at 09/16/24 0513   lactated ringers  10 mL/hr at 09/16/24 1030    Antimicrobials: Anti-infectives (From admission, onward)    Start     Dose/Rate Route Frequency Ordered Stop   09/16/24 1115  vancomycin  (VANCOCIN ) powder  Status:  Discontinued          As needed 09/16/24 1153 09/16/24 1213   09/15/24 0000  linezolid (ZYVOX) 600 MG tablet        600 mg Oral 2 times daily 09/14/24 1313 09/25/24 2359   09/11/24 1900  [MAR Hold]  DAPTOmycin (CUBICIN) IVPB 700 mg/100mL premix        (MAR Hold since Fri 09/16/2024 at 0833.Hold Reason: Transfer to a Procedural area)   700 mg 200 mL/hr over 30 Minutes Intravenous Daily 09/11/24 1738     09/04/24 0900  DAPTOmycin (CUBICIN) IVPB 700 mg/166mL premix  Status:  Discontinued        700 mg 200 mL/hr over 30  Minutes Intravenous Daily 09/04/24 0808 09/04/24 1624   09/03/24 1000  doxycycline  (VIBRA -TABS) tablet 100 mg  Status:  Discontinued        100 mg Oral Every 12 hours 08/25/24 0935 09/04/24 0719   09/03/24 0000  doxycycline  (VIBRA -TABS) 100 MG tablet  Status:  Discontinued        100 mg Oral 2 times daily 08/25/24 0938 09/14/24    09/02/24 0800  DAPTOmycin (CUBICIN) IVPB 700 mg/120mL premix        8 mg/kg  91.5 kg (Adjusted) 200 mL/hr over 30 Minutes Intravenous Daily 09/01/24 1107 09/02/24 0920   08/31/24 1300  ceFAZolin  (ANCEF ) IVPB 2g/100 mL premix        2 g 200 mL/hr over 30 Minutes Intravenous On call to O.R. 08/31/24 1258 08/31/24 1543   08/31/24 1300  vancomycin  (VANCOREADY) IVPB 1500 mg/300 mL        1,500 mg 150 mL/hr over 120 Minutes Intravenous On call to O.R. 08/31/24 1258 08/31/24 1543   08/20/24 1500  levofloxacin (LEVAQUIN) tablet 750 mg  Status:  Discontinued        750 mg Oral Daily 08/20/24 1410 08/23/24 0916   08/20/24 1500  metroNIDAZOLE (FLAGYL) tablet 500 mg  Status:  Discontinued        500 mg Oral Every 12 hours 08/20/24 1410 08/23/24 0916   08/18/24 1600  DAPTOmycin  (CUBICIN) IVPB 700 mg/175mL premix  Status:  Discontinued        6 mg/kg  108.9 kg 200 mL/hr over 30 Minutes Intravenous Daily 08/18/24 1519 08/18/24 1529   08/18/24 1600  DAPTOmycin (CUBICIN) IVPB 700 mg/164mL premix  Status:  Discontinued        8 mg/kg  91.5 kg (Adjusted) 200 mL/hr over 30 Minutes Intravenous Daily 08/18/24 1529 09/01/24 1106   08/18/24 0930  vancomycin  (VANCOCIN ) IVPB 1000 mg/200 mL premix  Status:  Discontinued       Placed in Followed by Linked Group   1,000 mg 200 mL/hr over 60 Minutes Intravenous Every 12 hours 08/18/24 0916 08/18/24 1519   08/18/24 0715  vancomycin  (VANCOREADY) IVPB 1250 mg/250 mL  Status:  Discontinued       Placed in Followed by Linked Group   1,250 mg 166.7 mL/hr over 90 Minutes Intravenous Every 12 hours 08/17/24 1914 08/18/24 0916   08/17/24 1915  vancomycin  (VANCOREADY) IVPB 2000 mg/400 mL       Placed in Followed by Linked Group   2,000 mg 200 mL/hr over 120 Minutes Intravenous  Once 08/17/24 1914 08/17/24 2303   08/17/24 1000  linezolid (ZYVOX) IVPB 600 mg  Status:  Discontinued        600 mg 300 mL/hr over 60 Minutes Intravenous Every 12 hours 08/17/24 0228 08/17/24 1914   08/17/24 1000  piperacillin-tazobactam (ZOSYN) IVPB 3.375 g  Status:  Discontinued        3.375 g 12.5 mL/hr over 240 Minutes Intravenous Every 8 hours 08/17/24 0303 08/20/24 1409   08/17/24 0200  piperacillin-tazobactam (ZOSYN) IVPB 3.375 g        3.375 g 100 mL/hr over 30 Minutes Intravenous  Once 08/17/24 0158 08/17/24 0216   08/17/24 0200  linezolid (ZYVOX) IVPB 600 mg        600 mg 300 mL/hr over 60 Minutes Intravenous  Once 08/17/24 0158 08/17/24 0415       Objective: Vitals:   09/16/24 1300 09/16/24 1315  BP: (!) 124/91   Pulse: 80   Resp: 20  Temp:    SpO2: 95% (P) 91%    Intake/Output Summary (Last 24 hours) at 09/16/2024 1335 Last data filed at 09/16/2024 1245 Gross per 24 hour  Intake 1415 ml  Output 1250 ml  Net 165 ml     Filed Weights   09/07/24 1500 09/13/24 0500  Weight: 107.9 kg 110.5 kg   Weight change:  Body mass index is 32.14 kg/m.   Physical Exam: General exam: Pleasant, middle-aged Caucasian male.  Not in any pain at rest. Skin: No rashes, lesions or ulcers. HEENT: Atraumatic, normocephalic, no obvious bleeding Lungs: Clear to auscultation bilaterally,  CVS: S1, S2, no murmur,   GI/Abd: Soft, nontender, nondistended, bowel sound present,   CNS: Alert, awake, oriented x 3 Psychiatry: Mood appropriate Extremities: Bilateral amputation status.  Improving redness, swelling on left thigh  Data Review: I have personally reviewed the laboratory data and studies available.  F/u labs ordered Unresulted Labs (From admission, onward)     Start     Ordered   09/16/24 1120  Aerobic/Anaerobic Culture w Gram Stain (surgical/deep wound)  RELEASE UPON ORDERING,   TIMED       Comments: Specimen A: Pre-op diagnosis: Abscess Left Below Knee Amptuation    09/16/24 1120   09/05/24 0500  CK  Weekly,   R     Question:  Specimen collection method  Answer:  Lab=Lab collect   09/04/24 0834   Signed and Held  CBC  Daily,   R     Question:  Specimen collection method  Answer:  Lab=Lab collect   Signed and Held   Signed and Held  Basic metabolic panel  Daily,   R     Question:  Specimen collection method  Answer:  Lab=Lab collect   Signed and Held   Signed and Held  CBC  (enoxaparin (LOVENOX)    CrCl >/= 30 ml/min)  Once,   R       Comments: Baseline for enoxaparin therapy IF NOT ALREADY DRAWN.  Notify MD if PLT < 100 K.   Question:  Specimen collection method  Answer:  Lab=Lab collect   Signed and Held   Signed and Held  Creatinine, serum  (enoxaparin (LOVENOX)    CrCl >/= 30 ml/min)  Once,   R       Comments: Baseline for enoxaparin therapy IF NOT ALREADY DRAWN.   Question:  Specimen collection method  Answer:  Lab=Lab collect   Signed and Held   Signed and Held  Creatinine, serum  (enoxaparin  (LOVENOX)    CrCl >/= 30 ml/min)  Weekly,   R     Comments: while on enoxaparin therapy   Question:  Specimen collection method  Answer:  Lab=Lab collect   Signed and Held            Signed, Landon [name] Baller, MD [facility] 09/16/2024

## 2024-09-16 NOTE — Progress Notes (Addendum)
 Unable to reach nurse for report at this time.  Unable to reach anyone on 5N at 0750.  Secure chat sent to nurse.
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
## 2024-09-16 NOTE — Progress Notes (Signed)
 Pharmacy Antibiotic Note  Ryan [name] is a 58 y.o. male admitted on 08/16/2024 with cellulitis, s/p MRSA bacteremia treatment (completed daptomycin 10/21), s/p left BKA on 10/22 and BKA revision on 11/7.  Post-op, patient had a cardiac arrest requiring transfer to the ICU.  Pharmacy has been consulted to switch antibiotic to vancomycin  and Zosyn.  Last Cubicin dose was at 1518.  SCr up to 2.17 per lab, afebrile, WBC WNL, lactate > 9.  Plan: Vanc 1gm IV Q24H for AUC 464 using SCr 2.17 Zosyn EID 3.375gm IV Q8H Monitor renal fxn, clinical progress, vanc levels as indicated   Height: 6' 1 (185.4 cm) Weight: 110.5 kg (243 lb 9.7 oz) IBW/kg (Calculated) : 79.9  Temp (24hrs), Avg:98.2 F (36.8 C), Min:97.9 F (36.6 C), Max:98.9 F (37.2 C)  Recent Labs  Lab 09/12/24 0552 09/13/24 0509 09/16/24 0504  WBC 9.8 9.3 9.6  CREATININE 0.92 1.09 1.75*    Estimated Creatinine Clearance: 59.9 mL/min (A) (by C-G formula based on SCr of 1.75 mg/dL (H)).    Allergies  Allergen Reactions   Acetaminophen  Nausea And Vomiting     Teo Moede D. Lendell, PharmD, BCPS, BCCCP 09/16/2024, 8:24 PM

## 2024-09-16 NOTE — Code Documentation (Addendum)
 Per bedside RN:  Patient had been very restless since coming back from surgery and was frequently sitting up on the side of the bed and complaining of pain.  About 1640 she entered the room to give him medications and found him lying back in bed unresponsive, pulseless and apneic. Code Blue called and CPR started.  See code record for details. Code team arrived about 1644.

## 2024-09-16 NOTE — Anesthesia Procedure Notes (Signed)
 Procedure Name: Intubation Date/Time: 09/16/2024 4:45 PM  Performed by: Jama Powell NOVAK, CRNAPre-anesthesia Checklist: Patient identified, Timeout performed, Emergency Drugs available, Suction available and Patient being monitored Patient Re-evaluated:Patient Re-evaluated prior to induction Oxygen Delivery Method: Ambu bag Preoxygenation: Pre-oxygenation with 100% oxygen Induction Type: IV induction Ventilation: Mask ventilation without difficulty Laryngoscope Size: Mac and 4 Grade View: Grade II Tube type: Oral Tube size: 7.5 mm Number of attempts: 1 Airway Equipment and Method: Stylet Placement Confirmation: positive ETCO2, ETT inserted through vocal cords under direct vision, CO2 detector and breath sounds checked- equal and bilateral Secured at: 23 cm Tube secured with: Tape Dental Injury: Teeth and Oropharynx as per pre-operative assessment

## 2024-09-16 NOTE — Procedures (Signed)
 Central Venous Catheter Insertion Procedure Note  Ryan [name]  [id number]  1966-03-14  Date:09/16/24  Time:6:16 PM   Provider Performing:Kimiye Strathman H Caitland Porchia   Procedure: Insertion of Non-tunneled Central Venous Catheter(36556) with US  guidance (23062)   Indication(s) Medication administration  Consent Unable to obtain consent due to emergent nature of procedure.  Anesthesia Topical only with 1% lidocaine    Timeout Verified patient identification, verified procedure, site/side was marked, verified correct patient position, special equipment/implants available, medications/allergies/relevant history reviewed, required imaging and test results available.  Sterile Technique Maximal sterile technique including full sterile barrier drape, hand hygiene, sterile gown, sterile gloves, mask, hair covering, sterile ultrasound probe cover (if used).  Procedure Description Area of catheter insertion was cleaned with chlorhexidine  and draped in sterile fashion.  With real-time ultrasound guidance a central venous catheter was placed into the right internal jugular vein. Nonpulsatile blood flow and easy flushing noted in all ports.  The catheter was sutured in place and sterile dressing applied.  Complications/Tolerance None; patient tolerated the procedure well. Chest X-ray is ordered to verify placement for internal jugular or subclavian cannulation.   Chest x-ray is not ordered for femoral cannulation.  EBL Minimal  Specimen(s) None  Ryan Shade, DNP, AGACNP-BC [facility]  Please see [email or web address] for pager details.  From 7A-7P if no response, please call [phone]. After hours, please call ELink [phone].

## 2024-09-16 NOTE — Progress Notes (Signed)
   09/16/24 1725  Spiritual Encounters  Type of Visit Initial  Reason for visit Code  OnCall Visit Yes   Chaplain responded to Code Blue and patient was revived. Patient is unconscious and was taken to 4N25. Family could not be reached and were not en route. Chaplain remains available upon request.  Chaplain Rasa Degrazia

## 2024-09-16 NOTE — Transfer of Care (Signed)
 Immediate Anesthesia Transfer of Care Note  Patient: Ryan [name]  Procedure(s) Performed: REVISION LEFT TRANSTIBIAL AMPUTATION, WOUND VAC APPLICATION (Left) APPLICATION, WOUND VAC (Left)  Patient Location: PACU  Anesthesia Type:General  Level of Consciousness: drowsy and responds to stimulation  Airway & Oxygen Therapy: Attached to CPAP per respiratory  Post-op Assessment: Report given to RN  Post vital signs: Reviewed and stable  Last Vitals:  Vitals Value Taken Time  BP 155/71 09/16/24 12:17  Temp    Pulse 88 09/16/24 12:18  Resp 23 09/16/24 12:18  SpO2 81 % 09/16/24 12:18  Vitals shown include unfiled device data.  Last Pain:  Vitals:   09/16/24 0910  TempSrc:   PainSc: 10-Worst pain ever      Patients Stated Pain Goal: 0 (09/14/24 0910)  Complications: No notable events documented.

## 2024-09-16 NOTE — Progress Notes (Signed)
 OT Cancellation Note  Patient Details Name: MURPHY DUZAN MRN: [id number] DOB: 06-01-1966   Cancelled Treatment:    Reason Eval/Treat Not Completed: Patient at procedure or test/ unavailable Patient off floor for L BKA revision. OT will follow back as time permits.   Ronal Gift E. Rozann Holts, OTR/L Acute Rehabilitation Services [phone]   Ronal Gift Salt 09/16/2024, 9:16 AM

## 2024-09-16 NOTE — Interval H&P Note (Signed)
 History and Physical Interval Note:  09/16/2024 6:46 AM  Ryan [name]  has presented today for surgery, with the diagnosis of Abscess Left Below Knee Amptuation.  The various methods of treatment have been discussed with the patient and family. After consideration of risks, benefits and other options for treatment, the patient has consented to  Procedure(s) with comments: REVISION AMPUTATION, BELOW THE KNEE (Left) - DEBRIDEMENT LEFT BELOW KNEE AMPUTATION APPLICATION, WOUND VAC (Left) as a surgical intervention.  The patient's history has been reviewed, patient examined, no change in status, stable for surgery.  I have reviewed the patient's chart and labs.  Questions were answered to the patient's satisfaction.     Brendaly Townsel V Azarah Dacy

## 2024-09-16 NOTE — Anesthesia Procedure Notes (Addendum)
 Procedure Name: LMA Insertion Date/Time: 09/16/2024 10:41 AM  Performed by: Delores Duwaine SAUNDERS, CRNAPre-anesthesia Checklist: Patient identified, Emergency Drugs available, Suction available and Patient being monitored Patient Re-evaluated:Patient Re-evaluated prior to induction Oxygen Delivery Method: Circle System Utilized Preoxygenation: Pre-oxygenation with 100% oxygen Induction Type: IV induction Ventilation: Mask ventilation without difficulty LMA: LMA inserted LMA Size: 5.0 Number of attempts: 1 Airway Equipment and Method: Bite block Placement Confirmation: positive ETCO2 Tube secured with: Tape Dental Injury: Teeth and Oropharynx as per pre-operative assessment

## 2024-09-16 NOTE — Code Documentation (Signed)
  Patient Name: Ryan [name]   MRN: [id number]   Date of Birth/ Sex: 29-Oct-1966 , male      Admission Date: 08/16/2024  Attending Provider: Claudene Toribio BROCKS, MD  Primary Diagnosis: Cellulitis of left lower extremity   Indication: Pt was in his usual state of health until this PM, when he was noted to be pulseless. Code blue was subsequently called. At the time of arrival on scene, ACLS protocol was underway.   Technical Description:  - CPR performance duration:  18 minutes  - Was defibrillation or cardioversion used? No   - Was external pacer placed? Yes  - Was patient intubated pre/post CPR? Yes   Medications Administered: Y = Yes; Blank = No Amiodarone    Atropine    Calcium  1  Epinephrine   4  Lidocaine     Magnesium     Norepinephrine  1  Phenylephrine     Sodium bicarbonate  2  Vasopressin     Post CPR evaluation:  - Final Status - Was patient successfully resuscitated ? Yes - What is current rhythm? Sinus  - What is current hemodynamic status? Hypotensive   Miscellaneous Information:  - Labs sent, including: CMP,CBC,Mag. LA  - Primary team notified?  Yes  - Family Notified? Called mother , left Voicemail   - Additional notes/ transfer status: Transferred to 5W74     Renne Homans, MD  09/16/2024, 5:45 PM

## 2024-09-16 NOTE — Anesthesia Preprocedure Evaluation (Signed)
 Anesthesia Evaluation  Patient identified by MRN, date of birth, ID band Patient awake    Reviewed: Allergy & Precautions, NPO status , Patient's Chart, lab work & pertinent test results, reviewed documented beta blocker date and time   Airway Mallampati: III  TM Distance: >3 FB Neck ROM: Full    Dental  (+) Dental Advisory Given, Poor Dentition, Missing   Pulmonary sleep apnea and Continuous Positive Airway Pressure Ventilation , COPD,  oxygen dependent, Current Smoker   Pulmonary exam normal breath sounds clear to auscultation       Cardiovascular hypertension, Pt. on home beta blockers and Pt. on medications + Peripheral Vascular Disease and +CHF  Normal cardiovascular exam Rhythm:Regular Rate:Normal     Neuro/Psych  Neuromuscular disease    GI/Hepatic Neg liver ROS,GERD  Medicated,,  Endo/Other  diabetes, Poorly Controlled, Type 2, Insulin  Dependent, Oral Hypoglycemic Agents  Obesity   Renal/GU Renal InsufficiencyRenal disease     Musculoskeletal  (+) Arthritis ,    Abdominal   Peds  Hematology  (+) Blood dyscrasia, anemia   Anesthesia Other Findings Day of surgery medications reviewed with the patient.  Reproductive/Obstetrics                              Anesthesia Physical Anesthesia Plan  ASA: 3  Anesthesia Plan: General   Post-op Pain Management:    Induction: Intravenous  PONV Risk Score and Plan: 1 and Dexamethasone  and Ondansetron   Airway Management Planned: LMA  Additional Equipment:   Intra-op Plan:   Post-operative Plan: Extubation in OR  Informed Consent: I have reviewed the patients History and Physical, chart, labs and discussed the procedure including the risks, benefits and alternatives for the proposed anesthesia with the patient or authorized representative who has indicated his/her understanding and acceptance.     Dental advisory given  Plan  Discussed with: CRNA  Anesthesia Plan Comments:          Anesthesia Quick Evaluation

## 2024-09-16 NOTE — Progress Notes (Signed)
 PT Cancellation Note  Patient Details Name: Ryan [name] MRN: [id number] DOB: [birth year]   Cancelled Treatment:    Reason Eval/Treat Not Completed: (P) Patient at procedure or test/unavailable, pt off unit in OR. Will check back as schedule allows to continue with PT POC.  [name]. PTA Acute Rehabilitation Services Office: [phone]    [name] 09/16/2024, 8:57 AM

## 2024-09-16 NOTE — Progress Notes (Signed)
 Pt A&Ox4 upon arrival from OR.  VSS on RA.  Pt reported pain, PRN oxy given. Pt refused to order a tray at the moment.   Upon checking on pt, pt was found down. No pulse. No respirations. Code Blue called and compressions began right away.  See MD note for details on code.  Pt transferred to 4N-ICU.      Charge RN transported this pt.  Unable to call report on this pt as RN was unable to take my call. Last attempt at 1853.

## 2024-09-16 NOTE — Procedures (Signed)
 Arterial Catheter Insertion Procedure Note  RALSTON VENUS  [id number]  [birth year]  Date:09/16/24  Time:6:17 PM    Provider Performing: Warren DEL Cline Draheim    Procedure: Insertion of Arterial Line (63379) with US  guidance (23062)   Indication(s) Blood pressure monitoring and/or need for frequent ABGs  Consent Unable to obtain consent due to emergent nature of procedure.  Anesthesia None   Time Out Verified patient identification, verified procedure, site/side was marked, verified correct patient position, special equipment/implants available, medications/allergies/relevant history reviewed, required imaging and test results available.   Sterile Technique Maximal sterile technique including full sterile barrier drape, hand hygiene, sterile gown, sterile gloves, mask, hair covering, sterile ultrasound probe cover (if used).   Procedure Description Area of catheter insertion was cleaned with chlorhexidine  and draped in sterile fashion. With real-time ultrasound guidance an arterial catheter was placed into the right radial artery.  Appropriate arterial tracings confirmed on monitor.     Complications/Tolerance None; patient tolerated the procedure well.   EBL Minimal   Specimen(s) None  Warren Shade, DNP, AGACNP-BC [facility]  Please see [email or web address] for pager details.  From 7A-7P if no response, please call [phone]. After hours, please call ELink [phone].

## 2024-09-16 NOTE — Progress Notes (Addendum)
 Interval CCM Note:  Hand off to night shift regarding patient tenuous status. Briefly, 58yoM post-arrest from the floor after L revision BKA today. ~20 minutes of down time, now in profound shock, acidotic.   Exam Poor neuro exam but not reliable given he just arrested. Intubated  Abdomen is protuberant, review of XR abdomen from arrival without free air.  Bilateral BKA  HR only 70s with shock, has not received BB since this AM  Labs  Recent ABG 7.044, pCO2 54.7 bicarb 14.9 -was given 2 amps bicarb  Lactic >9  Troponin pending  Hemoglobin 7.8  POCUS left chest difficult to tell if opacity v effusion but no large effusion to place CT.   Plan - giving 1 u PRBC now  - repeated ABG: pH 7.12, pCO2 60, O2 120s, bicarb 20   - 2 more amps bicarb  - attempted to decrease FiO2 but desatted, increased PEEP to 8. Increased rate to 32 and took him down to 6cc Vt to keep Peaks <45.  - starting epi w/ history of CHF, repeat POCUS again looks to have good LV squeeze. RV does not look dilated, can add neo as well if not improving - consider methylene blue  - coox 76% - most likely all septic, metabolic derangements.  - antibiotics ordered  - trying to get in touch with family - I have called mother again with no answer. Left voicemail to call back ASAP.  - CT scans if he becomes more stable   Tinnie [name] Furth, PA-C [facility] 09/16/24 7:51 PM  Please see [email or web address] for pager details.  From 7A-7P if no response, please call [phone] After hours, please call ELink [phone]

## 2024-09-17 ENCOUNTER — Inpatient Hospital Stay (HOSPITAL_COMMUNITY)

## 2024-09-17 DIAGNOSIS — I469 Cardiac arrest, cause unspecified: Secondary | ICD-10-CM

## 2024-09-17 DIAGNOSIS — R7881 Bacteremia: Secondary | ICD-10-CM | POA: Diagnosis not present

## 2024-09-17 DIAGNOSIS — E11628 Type 2 diabetes mellitus with other skin complications: Secondary | ICD-10-CM | POA: Diagnosis not present

## 2024-09-17 DIAGNOSIS — L03116 Cellulitis of left lower limb: Secondary | ICD-10-CM | POA: Diagnosis not present

## 2024-09-17 DIAGNOSIS — I5032 Chronic diastolic (congestive) heart failure: Secondary | ICD-10-CM | POA: Diagnosis not present

## 2024-09-17 LAB — TYPE AND SCREEN
ABO/RH(D): A POS
Antibody Screen: NEGATIVE
Unit division: 0

## 2024-09-17 LAB — POCT I-STAT 7, (LYTES, BLD GAS, ICA,H+H)
Acid-base deficit: 13 mmol/L — ABNORMAL HIGH (ref 0.0–2.0)
Bicarbonate: 17.7 mmol/L — ABNORMAL LOW (ref 20.0–28.0)
Calcium, Ion: 1.05 mmol/L — ABNORMAL LOW (ref 1.15–1.40)
HCT: 33 % — ABNORMAL LOW (ref 39.0–52.0)
Hemoglobin: 11.2 g/dL — ABNORMAL LOW (ref 13.0–17.0)
O2 Saturation: 92 %
Patient temperature: 97.5
Potassium: 4.3 mmol/L (ref 3.5–5.1)
Sodium: 140 mmol/L (ref 135–145)
TCO2: 20 mmol/L — ABNORMAL LOW (ref 22–32)
pCO2 arterial: 61.1 mmHg — ABNORMAL HIGH (ref 32–48)
pH, Arterial: 7.066 — CL (ref 7.35–7.45)
pO2, Arterial: 88 mmHg (ref 83–108)

## 2024-09-17 LAB — BPAM RBC
Blood Product Expiration Date: 202511202359
ISSUE DATE / TIME: 202511071926
Unit Type and Rh: 6200

## 2024-09-17 LAB — BASIC METABOLIC PANEL WITH GFR
Anion gap: 25 — ABNORMAL HIGH (ref 5–15)
BUN: 63 mg/dL — ABNORMAL HIGH (ref 6–20)
CO2: 15 mmol/L — ABNORMAL LOW (ref 22–32)
Calcium: 7.9 mg/dL — ABNORMAL LOW (ref 8.9–10.3)
Chloride: 100 mmol/L (ref 98–111)
Creatinine, Ser: 2.55 mg/dL — ABNORMAL HIGH (ref 0.61–1.24)
GFR, Estimated: 28 mL/min — ABNORMAL LOW (ref >60)
Glucose, Bld: 152 mg/dL — ABNORMAL HIGH (ref 70–99)
Potassium: 4.6 mmol/L (ref 3.5–5.1)
Sodium: 140 mmol/L (ref 135–145)

## 2024-09-17 LAB — CBC
HCT: 34.9 % — ABNORMAL LOW (ref 39.0–52.0)
Hemoglobin: 9.8 g/dL — ABNORMAL LOW (ref 13.0–17.0)
MCH: 24.9 pg — ABNORMAL LOW (ref 26.0–34.0)
MCHC: 28.1 g/dL — ABNORMAL LOW (ref 30.0–36.0)
MCV: 88.6 fL (ref 80.0–100.0)
Platelets: 509 K/uL — ABNORMAL HIGH (ref 150–400)
RBC: 3.94 MIL/uL — ABNORMAL LOW (ref 4.22–5.81)
RDW: 19 % — ABNORMAL HIGH (ref 11.5–15.5)
WBC: 28.6 K/uL — ABNORMAL HIGH (ref 4.0–10.5)
nRBC: 1.7 % — ABNORMAL HIGH (ref 0.0–0.2)

## 2024-09-17 LAB — ECHOCARDIOGRAM COMPLETE
AR max vel: 2.55 cm2
AV Area VTI: 2.79 cm2
AV Area mean vel: 2.65 cm2
AV Mean grad: 5.5 mmHg
AV Peak grad: 11.9 mmHg
Ao pk vel: 1.73 m/s
Area-P 1/2: 4.65 cm2
Height: 73 in
S' Lateral: 2.8 cm
Weight: 4148.18 [oz_av]

## 2024-09-17 LAB — GLUCOSE, CAPILLARY
Glucose-Capillary: 140 mg/dL — ABNORMAL HIGH (ref 70–99)
Glucose-Capillary: 155 mg/dL — ABNORMAL HIGH (ref 70–99)
Glucose-Capillary: 161 mg/dL — ABNORMAL HIGH (ref 70–99)
Glucose-Capillary: 173 mg/dL — ABNORMAL HIGH (ref 70–99)
Glucose-Capillary: 178 mg/dL — ABNORMAL HIGH (ref 70–99)
Glucose-Capillary: 182 mg/dL — ABNORMAL HIGH (ref 70–99)

## 2024-09-17 LAB — POTASSIUM: Potassium: 4.4 mmol/L (ref 3.5–5.1)

## 2024-09-17 LAB — HEPATIC FUNCTION PANEL
ALT: 2133 U/L — ABNORMAL HIGH (ref 0–44)
AST: 3941 U/L — ABNORMAL HIGH (ref 15–41)
Albumin: 1.6 g/dL — ABNORMAL LOW (ref 3.5–5.0)
Alkaline Phosphatase: 132 U/L — ABNORMAL HIGH (ref 38–126)
Bilirubin, Direct: 1 mg/dL — ABNORMAL HIGH (ref 0.0–0.2)
Indirect Bilirubin: 0.9 mg/dL (ref 0.3–0.9)
Total Bilirubin: 1.9 mg/dL — ABNORMAL HIGH (ref 0.0–1.2)
Total Protein: 6.2 g/dL — ABNORMAL LOW (ref 6.5–8.1)

## 2024-09-17 LAB — LACTIC ACID, PLASMA
Lactic Acid, Venous: >9 mmol/L (ref 0.5–1.9)
Lactic Acid, Venous: >9 mmol/L (ref 0.5–1.9)

## 2024-09-17 MED ORDER — METHYLENE BLUE (ANTIDOTE) 1 % IV SOLN
2.0000 mg/kg | Freq: Once | INTRAVENOUS | Status: AC
  Administered 2024-09-17: 235 mg via INTRAVENOUS
  Filled 2024-09-17: qty 23.5

## 2024-09-17 MED ORDER — IOHEXOL 350 MG/ML SOLN
75.0000 mL | Freq: Once | INTRAVENOUS | Status: AC | PRN
  Administered 2024-09-17: 75 mL via INTRAVENOUS

## 2024-09-17 MED ORDER — STERILE WATER FOR INJECTION IV SOLN
INTRAVENOUS | Status: DC
  Filled 2024-09-17 (×4): qty 1000

## 2024-09-17 MED ORDER — SODIUM BICARBONATE 8.4 % IV SOLN
100.0000 meq | Freq: Once | INTRAVENOUS | Status: AC
  Administered 2024-09-17: 100 meq via INTRAVENOUS
  Filled 2024-09-17: qty 100

## 2024-09-17 MED ORDER — PERFLUTREN LIPID MICROSPHERE
1.0000 mL | INTRAVENOUS | Status: AC | PRN
  Administered 2024-09-17: 2 mL via INTRAVENOUS

## 2024-09-17 NOTE — Progress Notes (Addendum)
 [facility] referral made an documented in post- partum column, CCM aware of pt's low output

## 2024-09-17 NOTE — Progress Notes (Signed)
 Asked to evaluate patient in refractory acidosis on 3 pressors, bicarb gtt. RR on vent increased.   BP 130/66   Pulse 80   Temp (!) 97.5 F (36.4 C) (Oral)   Resp (!) 24   Ht 6' 1 (1.854 m)   Wt 117.6 kg   SpO2 98%   BMI 34.21 kg/m  Critically ill appearing Pupils midrange, fixed No response to pain Synchronous on MV  Attempted to call mother at the number in the chart. L/m for her to call back.   Agree with Dr. Theressa assessment that refractory acidosis is not reversing and he has had high risk of cardiac arrest. At this point resuscitation efforts would likely be futile since there seems to be irreversible pathology. In the absence of having family to help with making decisions, would agree that DNR is most appropriate from a medical standpoint and would support changing code status at this time. D/w Dr. Claudene.  Ryan SHAUNNA Gaskins, DO 09/17/24 7:28 AM [facility]  For contact information, see Amion. If no response to pager, please call PCCM consult pager. After hours, 7PM- 7AM, please call Elink.

## 2024-09-17 NOTE — Progress Notes (Signed)
 09/17/2024 CT head findings noted Keep trying to reach family. No escalation.  Rolan Sharps MD PCCM

## 2024-09-17 NOTE — Progress Notes (Addendum)
 Patient transferred from 5N to 4N post arrest.  RT, Rapid RN, and CCM NP present during transfer.  Multiple procedures performed at bedside.  Patient is currently unresponsive with no sedation.    Multiple attempts to call contact (mother on chart) but went straight to voicemail.    11/8.  Patient's sister called and updated by Dr. Claudene CCM.  Sister is on the way.

## 2024-09-17 NOTE — Consult Note (Signed)
   NAME:  Ryan [name], MRN:  [id number], DOB:  02/04/1966, LOS: 31 ADMISSION DATE:  08/16/2024, CONSULTATION DATE:  09/16/2024 REFERRING MD:  TRH, CHIEF COMPLAINT: In-hospital cardiac arrest  History of Present Illness:  Ryan [name] is a 58 year old male with extensive past medical history significant for but not limited to CHF, chronic osteomyelitis COPD, uncontrolled type 2 diabetes, diabetic peripheral edema, right BKA, tobacco abuse and arthritis who presented to the ED at [facility] originally 10/7 from home via EMS for hyperglycemia and reports of left foot wound.  Workup revealed a necrotic diabetic foot wound concerning for osteomyelitis that was later confirmed on x-ray and patient was admitted per hospitalist.  See below for timeline of pertinent significant hospital events during admission thus far.  Pertinent  Medical History  CHF, chronic osteomyelitis COPD, uncontrolled type 2 diabetes, diabetic peripheral edema, right BKA, tobacco abuse and arthritis  Significant Hospital Events: Including procedures, antibiotic start and stop dates in addition to other pertinent events   10/7 presented with known open left foot wound and hyperglycemia consistent with osteomyelitis.  Per TR[facility]. MRI foot with findings compatible with osteomyelitis/septic arthritis of the underlying fifth proximal phalanx and mid to distal fifth metatarsal.  10/8 orthopedics consulted with recommendations made for transtibial amputation but patient refused 10/9 ID consulted recommendations made to DC vancomycin  and start IV daptomycin 10/11 MRSA bacteremia identified 10/13 daptomycin continued, Levaquin and Flagyl stopped per ID 10/22 L BKA 11/7  L BKA revision, cardiac arrest  Interim History / Subjective:  Profound acidemia and shock overnight.  Objective    Blood pressure 130/66, pulse 80, temperature (!) 97.5 F (36.4 C), temperature source Oral, resp. rate (!) 24, height 6' 1 (1.854 m), weight 117.6  kg, SpO2 98%.    Vent Mode: PRVC FiO2 (%):  [60 %-100 %] 100 % Set Rate:  [12 bmp-32 bmp] 24 bmp Vt Set:  [460 mL-640 mL] 530 mL PEEP:  [5 cmH20-8 cmH20] 8 cmH20 Plateau Pressure:  [23 cmH20-33 cmH20] 31 cmH20   Intake/Output Summary (Last 24 hours) at 09/17/2024 0708 Last data filed at 09/17/2024 0700 Gross per 24 hour  Intake 3112.71 ml  Output 1680 ml  Net 1432.71 ml   Filed Weights   09/07/24 1500 09/13/24 0500 09/17/24 0454  Weight: 107.9 kg 110.5 kg 117.6 kg    Examination: Unresponsive on no sedation No brainstem reflexes Ext warm Lungs minimal rhonci, very stiff lungs on vent  Labs reviewed  Resolved problem list   Assessment and Plan  IHCA- see discussion yesterday, has not responded to resuscitation, mechanical ventilation, bicarb, pressors etc.  Now in MSOF and terminal decline, comatose, too unstable to scan nor do I think there will be anything on scan we can act on given clinical status. Left foot osteomyelitis Diabetic foot infection Left BKA stump cellulitis- revision BKA 11/7 for abscess MRSA bacteremia - neg TEE Impaired mobility Bilateral BKA status  Chronic opiate use disorder Diabetic neuropathy Type 2 diabetes mellitus uncontrolled Chronic diastolic CHF HTN PAD HLD COPD  chronic daily smoker GERD OSA  - Do not escalate care - Continue abx, bicarb, stress steroids, pressors, vent bundle - Pain meds PRN - Additional chest compressions and shocks would be futile in this patient and only prolong dying process - Called and left mother another voicemail - Unfortunately we are near EOL, do not think will live through day  33 min cc time Rolan Sharps MD PCCM

## 2024-09-17 NOTE — Progress Notes (Signed)
 Interval update:  Continues to be acidotic > most recent ABG with pH 7.06, pco2 60s, bicarb 17 on bicarb gtt at 50cc/hr. RRT increased Vt and I doubled bicarb rate to 100cc/hr. Will recheck again. Repeat lactic still >9. Concern for intraabdominal perforation vs other. Will try to get him down to scanner when stable enough   Tinnie [name] Furth, PA-C [facility] 09/17/24 5:59 AM  Please see [email or web address] for pager details.  From 7A-7P if no response, please call [phone] After hours, please call ELink [phone]

## 2024-09-17 NOTE — Progress Notes (Signed)
 Pt transported to and from CT on vent w/o complications. Pt to remain on 100% upon return

## 2024-09-17 NOTE — Progress Notes (Addendum)
 09/17/2024 Has stabilized out this am (no escalating pressor need but on really high doses at present). Echo views aren't great but RV is a dilated with some septal bowing. I think we get CT head and CTA chest. If large PE and CT head neg there is argument to try mechanical thrombectomy to improve dead space which would hopefully help acidemia (along with bicarb and likely CRRT assuming all goes well). If neg or CT head shows edema it confirms there is nothing left to offer. If arrests in CT will not do chest compressions or shocks.  Benefits of contrast outweigh risks of further kidney injury.  Rolan Sharps MD PCCM

## 2024-09-17 NOTE — Progress Notes (Signed)
 Patient ID: Ryan [name], male   DOB: 21-Apr-1966, 58 y.o.   MRN: [id number] Patient is postoperative day 1 revision left transtibial amputation.  Events of last night noted.  There is 250 cc in the wound VAC canister with a good suction fit.  Tissue cultures are showing gram-negative rods.

## 2024-09-17 NOTE — Progress Notes (Signed)
 Echocardiogram 2D Echocardiogram has been performed.  Tinnie [name] Gosling RDCS 09/17/2024, 9:37 AM

## 2024-09-18 ENCOUNTER — Other Ambulatory Visit (HOSPITAL_COMMUNITY): Payer: Self-pay

## 2024-09-18 DIAGNOSIS — R7881 Bacteremia: Secondary | ICD-10-CM | POA: Diagnosis not present

## 2024-09-18 DIAGNOSIS — E11628 Type 2 diabetes mellitus with other skin complications: Secondary | ICD-10-CM | POA: Diagnosis not present

## 2024-09-18 DIAGNOSIS — L03116 Cellulitis of left lower limb: Secondary | ICD-10-CM | POA: Diagnosis not present

## 2024-09-18 DIAGNOSIS — J80 Acute respiratory distress syndrome: Secondary | ICD-10-CM | POA: Diagnosis not present

## 2024-09-18 LAB — GLUCOSE, CAPILLARY
Glucose-Capillary: 198 mg/dL — ABNORMAL HIGH (ref 70–99)
Glucose-Capillary: 184 mg/dL — ABNORMAL HIGH (ref 70–99)
Glucose-Capillary: 208 mg/dL — ABNORMAL HIGH (ref 70–99)
Glucose-Capillary: 201 mg/dL — ABNORMAL HIGH (ref 70–99)
Glucose-Capillary: 231 mg/dL — ABNORMAL HIGH (ref 70–99)

## 2024-09-18 LAB — BASIC METABOLIC PANEL WITH GFR
Anion gap: 21 — ABNORMAL HIGH (ref 5–15)
BUN: 91 mg/dL — ABNORMAL HIGH (ref 6–20)
CO2: 20 mmol/L — ABNORMAL LOW (ref 22–32)
Calcium: 6.6 mg/dL — ABNORMAL LOW (ref 8.9–10.3)
Chloride: 92 mmol/L — ABNORMAL LOW (ref 98–111)
Creatinine, Ser: 4.3 mg/dL — ABNORMAL HIGH (ref 0.61–1.24)
GFR, Estimated: 15 mL/min — ABNORMAL LOW (ref >60)
Glucose, Bld: 208 mg/dL — ABNORMAL HIGH (ref 70–99)
Potassium: 6.9 mmol/L (ref 3.5–5.1)
Sodium: 133 mmol/L — ABNORMAL LOW (ref 135–145)

## 2024-09-18 LAB — CBC
HCT: 30.7 % — ABNORMAL LOW (ref 39.0–52.0)
Hemoglobin: 9.6 g/dL — ABNORMAL LOW (ref 13.0–17.0)
MCH: 24.7 pg — ABNORMAL LOW (ref 26.0–34.0)
MCHC: 31.3 g/dL (ref 30.0–36.0)
MCV: 79.1 fL — ABNORMAL LOW (ref 80.0–100.0)
Platelets: 364 K/uL (ref 150–400)
RBC: 3.88 MIL/uL — ABNORMAL LOW (ref 4.22–5.81)
RDW: 19.3 % — ABNORMAL HIGH (ref 11.5–15.5)
WBC: 26.8 K/uL — ABNORMAL HIGH (ref 4.0–10.5)
nRBC: 0.6 % — ABNORMAL HIGH (ref 0.0–0.2)

## 2024-09-18 MED ORDER — HYDROMORPHONE HCL-NACL 50-0.9 MG/50ML-% IV SOLN
0.0000 mg/h | INTRAVENOUS | Status: DC
  Administered 2024-09-18: 0.5 mg/h via INTRAVENOUS
  Filled 2024-09-18: qty 50

## 2024-09-18 MED ORDER — GLYCOPYRROLATE 0.2 MG/ML IJ SOLN: 0.2000 mg | INTRAMUSCULAR | Status: DC | PRN

## 2024-09-18 MED ORDER — GLYCOPYRROLATE 1 MG PO TABS: 1.0000 mg | ORAL_TABLET | ORAL | Status: DC | PRN

## 2024-09-18 MED ORDER — ALBUTEROL SULFATE (2.5 MG/3ML) 0.083% IN NEBU
10.0000 mg | INHALATION_SOLUTION | Freq: Once | RESPIRATORY_TRACT | Status: AC
Start: 2024-09-18 — End: 2024-09-18
  Administered 2024-09-18: 10 mg via RESPIRATORY_TRACT
  Filled 2024-09-18: qty 12

## 2024-09-18 MED ORDER — POLYVINYL ALCOHOL 1.4 % OP SOLN: 1.0000 [drp] | Freq: Four times a day (QID) | OPHTHALMIC | Status: DC | PRN

## 2024-09-18 MED ORDER — SODIUM ZIRCONIUM CYCLOSILICATE 10 G PO PACK
10.0000 g | PACK | Freq: Once | ORAL | Status: AC
  Administered 2024-09-18: 10 g
  Filled 2024-09-18: qty 1

## 2024-09-18 MED ORDER — VANCOMYCIN VARIABLE DOSE PER UNSTABLE RENAL FUNCTION (PHARMACIST DOSING): Status: DC

## 2024-09-18 MED ORDER — SODIUM CHLORIDE 0.9 % IV SOLN: INTRAVENOUS | Status: DC

## 2024-09-18 MED ORDER — DEXTROSE 50 % IV SOLN
1.0000 | Freq: Once | INTRAVENOUS | Status: AC
  Administered 2024-09-18: 50 mL via INTRAVENOUS
  Filled 2024-09-18: qty 50

## 2024-09-18 MED ORDER — HYDROMORPHONE BOLUS VIA INFUSION: 1.0000 mg | INTRAVENOUS | Status: DC | PRN

## 2024-09-18 MED ORDER — INSULIN ASPART 100 UNIT/ML IV SOLN
10.0000 [IU] | Freq: Once | INTRAVENOUS | Status: AC
  Administered 2024-09-18: 10 [IU] via INTRAVENOUS
  Filled 2024-09-18: qty 10

## 2024-09-19 ENCOUNTER — Other Ambulatory Visit (HOSPITAL_COMMUNITY): Payer: Self-pay

## 2024-09-19 ENCOUNTER — Encounter (HOSPITAL_COMMUNITY): Payer: Self-pay | Admitting: Orthopedic Surgery

## 2024-09-19 NOTE — Anesthesia Postprocedure Evaluation (Signed)
 Anesthesia Post Note  Patient: [employer]  Procedure(s) Performed: REVISION LEFT TRANSTIBIAL AMPUTATION, WOUND VAC APPLICATION (Left) APPLICATION, WOUND VAC (Left)     Patient location during evaluation: PACU Anesthesia Type: General Level of consciousness: awake and alert Pain management: pain level controlled Vital Signs Assessment: post-procedure vital signs reviewed and stable Respiratory status: spontaneous breathing, nonlabored ventilation, respiratory function stable and patient connected to nasal cannula oxygen Cardiovascular status: blood pressure returned to baseline and stable Postop Assessment: no apparent nausea or vomiting Anesthetic complications: no Comments: Required CPAP temporarily in PACU while waking up.   No notable events documented.  Last Vitals:    Last Pain:                 Garnette [name] [place]ern

## 2024-09-21 LAB — AEROBIC/ANAEROBIC CULTURE W GRAM STAIN (SURGICAL/DEEP WOUND): Gram Stain: NONE SEEN

## 2024-09-23 ENCOUNTER — Inpatient Hospital Stay: Admitting: Internal Medicine

## 2024-09-23 MED FILL — Medication: Qty: 1 | Status: AC

## 2024-10-10 NOTE — Progress Notes (Signed)
 45 ml of Dilaudid  wasted by Quintin, RN and witnessed by Lyle Norlander, Occupational Psychologist

## 2024-10-10 NOTE — Progress Notes (Signed)
 K= 6.9, CCM made aware, awaiting new orders

## 2024-10-10 NOTE — Progress Notes (Signed)
 Chaplain responds to page of family requesting visit for pt at EOL. Chaplain arrives minutes later to find pt deceased and accompanied by wife and several family members. Chaplain provides compassionate presence and encourages life review and offers prayer.

## 2024-10-10 NOTE — Procedures (Signed)
 Extubation Procedure Note  Patient Details:   Name: Ryan [name] DOB: [birth year] MRN: [id number]   Airway Documentation:    Vent end date: 2024/10/18 Vent end time: 1947   Evaluation  O2 sats: currently acceptable Complications: No apparent complications Patient did tolerate procedure well. Bilateral Breath Sounds: Expiratory wheezes, Diminished   Pt extubated per order for comfort care measures. RN and family at bedside.   Marry JAYSON Mattock 10-18-2024, 7:48 PM

## 2024-10-10 NOTE — Death Summary Note (Signed)
  DEATH SUMMARY   Patient Details  Name: Ryan [name] MRN: [id number] DOB: Jul 12, 1966  Admission/Discharge Information   Admit Date:  Sep 01, 2024  Date of Death: Date of Death: October 04, 2024  Time of Death: Time of Death: 10/19/58  Length of Stay: Oct 20, 2031  Referring Physician: [name] [name], NP    Diagnoses  Preliminary cause of death: osteomyelitis of left leg x 6 months Secondary Diagnoses (including complications and co-morbidities):  Principal Problem:   Cellulitis of left lower extremity Active Problems:   Uncontrolled type 2 diabetes mellitus with hyperglycemia, without long-term current use of insulin  (HCC)   S/P BKA (below knee amputation) unilateral, right (HCC)   Tobacco use disorder   Chronic prescription opiate use - sees pain clinic in [place], [place].   GERD (gastroesophageal reflux disease)   CHF (congestive heart failure) (HCC)   COPD (chronic obstructive pulmonary disease) (HCC)   Osteomyelitis (HCC)   Infected wound   Erythema   MRSA bacteremia   Left below-knee amputee (HCC)   Dehiscence of amputation stump of left lower extremity [facility])   Brief Hospital Course (including significant findings, care, treatment, and services provided and events leading to death)  Ryan [name] is a 58 year old male with extensive past medical history significant for but not limited to CHF, chronic osteomyelitis COPD, uncontrolled type 2 diabetes, diabetic peripheral edema, right BKA, tobacco abuse and arthritis who presented to the ED at [facility] originally 09/01/24 from home via EMS for hyperglycemia and reports of left foot wound.  Workup revealed a necrotic diabetic foot wound concerning for osteomyelitis that was later confirmed on x-ray and patient was admitted per hospitalist.  See below for timeline of pertinent significant hospital events during admission thus far.   09-01-24 presented with known open left foot wound and hyperglycemia consistent with osteomyelitis.  Per [facility]. MRI foot  with findings compatible with osteomyelitis/septic arthritis of the underlying fifth proximal phalanx and mid to distal fifth metatarsal.  10/8 orthopedics consulted with recommendations made for transtibial amputation but patient refused 10/9 ID consulted recommendations made to DC vancomycin  and start IV daptomycin 10/11 MRSA bacteremia identified 10/13 daptomycin continued, Levaquin and Flagyl stopped per ID 10/22 L BKA 11/7  L BKA revision, cardiac arrest 11/8 CT head with devastating injury, family meeting  Patient transitioned to comfort and passed peacefully on Oct 04, 2024.  [name] 09/23/2024, 6:24 PM

## 2024-10-10 NOTE — Progress Notes (Signed)
   NAME:  DELAN KSIAZEK, MRN:  [id number], DOB:  12/25/1965, LOS: 32 ADMISSION DATE:  08/16/2024, CONSULTATION DATE:  09/16/2024 REFERRING MD:  TRH, CHIEF COMPLAINT: In-hospital cardiac arrest  History of Present Illness:  Ryan [name] is a 58 year old male with extensive past medical history significant for but not limited to CHF, chronic osteomyelitis COPD, uncontrolled type 2 diabetes, diabetic peripheral edema, right BKA, tobacco abuse and arthritis who presented to the ED at [facility] originally 10/7 from home via EMS for hyperglycemia and reports of left foot wound.  Workup revealed a necrotic diabetic foot wound concerning for osteomyelitis that was later confirmed on x-ray and patient was admitted per hospitalist.  See below for timeline of pertinent significant hospital events during admission thus far.  Pertinent  Medical History  CHF, chronic osteomyelitis COPD, uncontrolled type 2 diabetes, diabetic peripheral edema, right BKA, tobacco abuse and arthritis  Significant Hospital Events: Including procedures, antibiotic start and stop dates in addition to other pertinent events   10/7 presented with known open left foot wound and hyperglycemia consistent with osteomyelitis.  Per [facility]. MRI foot with findings compatible with osteomyelitis/septic arthritis of the underlying fifth proximal phalanx and mid to distal fifth metatarsal.  10/8 orthopedics consulted with recommendations made for transtibial amputation but patient refused 10/9 ID consulted recommendations made to DC vancomycin  and start IV daptomycin 10/11 MRSA bacteremia identified 10/13 daptomycin continued, Levaquin and Flagyl stopped per ID 10/22 L BKA 11/7  L BKA revision, cardiac arrest 11/8 CT head with devastating injury, family meeting  Interim History / Subjective:  Pressor needs high but improved. Remains comatose on no sedation Remains difficult to oxygenate, slowly worsening  Objective    Blood pressure  106/62, pulse 91, temperature 98.1 F (36.7 C), temperature source Axillary, resp. rate (!) 28, height 6' 1 (1.854 m), weight 123.8 kg, SpO2 (!) 87%.    Vent Mode: PRVC FiO2 (%):  [100 %] 100 % Set Rate:  [28 bmp] 28 bmp Vt Set:  [530 mL] 530 mL PEEP:  [5 cmH20-8 cmH20] 5 cmH20 Plateau Pressure:  [25 cmH20-29 cmH20] 29 cmH20   Intake/Output Summary (Last 24 hours) at 09-27-2024 1239 Last data filed at 2024-09-27 1200 Gross per 24 hour  Intake 4612.51 ml  Output 575 ml  Net 4037.51 ml   Filed Weights   09/13/24 0500 09/17/24 0454 27-Sep-2024 0347  Weight: 110.5 kg 117.6 kg 123.8 kg    Examination: Unresponsive on no sedation No brainstem reflexes except resp drive Bilateral BKAs, L dressed postop no strikethorugh Lungs minimal rhonci, very stiff lungs on vent  Labs reviewed  Resolved problem list   Assessment and Plan  IHCA with devastating anoxic brain injury- preceding respiratory event is presumed etiology; CTA neg for PE, echo unrevealing, initial post arrest labs unrevealing Progressive ARDS post arrest- fluid likely contributing as well, losing ability to oxygenate Left foot osteomyelitis Diabetic foot infection Left BKA stump cellulitis- revision BKA 11/7 for abscess MRSA bacteremia - neg TEE Impaired mobility Bilateral BKA status  Chronic opiate use disorder Diabetic neuropathy Type 2 diabetes mellitus uncontrolled Chronic diastolic CHF HTN PAD HLD COPD  chronic daily smoker GERD OSA  Met with sister yesterday and mother, they understand at EOL; giving time to visit then likely transition to comfort this afternoon  Continue care as ordered in interim  33 min cc time Rolan Sharps MD PCCM

## 2024-10-10 NOTE — Progress Notes (Signed)
 eLink Physician-Brief Progress Note Patient Name: Ryan [name] DOB: [birth year] MRN: [id number]   Date of Service  10/01/2024  HPI/Events of Note  hyperkalemia  eICU Interventions  Medical management ordered.     Intervention Category Intermediate Interventions: Electrolyte abnormality - evaluation and management  Jerilynn Berg 10-01-24, 6:37 AM

## 2024-10-10 DEATH — deceased

## 2024-10-27 LAB — MINIMUM INHIBITORY CONC. (1 DRUG)
